# Patient Record
Sex: Female | Born: 1940 | Race: White | Hispanic: No | Marital: Married | Smoking: Never smoker
Health system: Southern US, Community
[De-identification: ages and names within clinical notes are randomized; demographics above are authoritative.]

## PROBLEM LIST (undated history)

## (undated) DIAGNOSIS — G4752 REM sleep behavior disorder: Secondary | ICD-10-CM

## (undated) DIAGNOSIS — Z8719 Personal history of other diseases of the digestive system: Secondary | ICD-10-CM

## (undated) DIAGNOSIS — R079 Chest pain, unspecified: Secondary | ICD-10-CM

## (undated) DIAGNOSIS — K5900 Constipation, unspecified: Secondary | ICD-10-CM

## (undated) DIAGNOSIS — E785 Hyperlipidemia, unspecified: Secondary | ICD-10-CM

## (undated) DIAGNOSIS — R51 Headache: Secondary | ICD-10-CM

## (undated) DIAGNOSIS — M940 Chondrocostal junction syndrome [Tietze]: Secondary | ICD-10-CM

## (undated) DIAGNOSIS — M4800 Spinal stenosis, site unspecified: Secondary | ICD-10-CM

## (undated) DIAGNOSIS — G20 Parkinson's disease: Secondary | ICD-10-CM

## (undated) DIAGNOSIS — R7303 Prediabetes: Secondary | ICD-10-CM

## (undated) DIAGNOSIS — M1990 Unspecified osteoarthritis, unspecified site: Secondary | ICD-10-CM

## (undated) DIAGNOSIS — D649 Anemia, unspecified: Secondary | ICD-10-CM

## (undated) DIAGNOSIS — Z789 Other specified health status: Secondary | ICD-10-CM

## (undated) DIAGNOSIS — K219 Gastro-esophageal reflux disease without esophagitis: Secondary | ICD-10-CM

## (undated) DIAGNOSIS — R519 Headache, unspecified: Secondary | ICD-10-CM

## (undated) HISTORY — PX: COLONOSCOPY: SHX174

## (undated) HISTORY — DX: Parkinson's disease: G20

## (undated) HISTORY — PX: ABDOMINAL HYSTERECTOMY: SHX81

## (undated) HISTORY — DX: Other specified health status: Z78.9

## (undated) HISTORY — DX: Prediabetes: R73.03

## (undated) HISTORY — DX: REM sleep behavior disorder: G47.52

## (undated) HISTORY — DX: Chondrocostal junction syndrome (tietze): M94.0

## (undated) HISTORY — PX: APPENDECTOMY: SHX54

## (undated) HISTORY — PX: HERNIA REPAIR: SHX51

## (undated) HISTORY — DX: Gastro-esophageal reflux disease without esophagitis: K21.9

## (undated) HISTORY — DX: Hyperlipidemia, unspecified: E78.5

## (undated) HISTORY — DX: Spinal stenosis, site unspecified: M48.00

---

## 1998-10-28 ENCOUNTER — Ambulatory Visit (HOSPITAL_COMMUNITY): Admission: RE | Admit: 1998-10-28 | Discharge: 1998-10-28 | Payer: Self-pay | Admitting: Internal Medicine

## 1998-10-28 ENCOUNTER — Encounter: Payer: Self-pay | Admitting: Internal Medicine

## 1998-11-18 ENCOUNTER — Encounter: Payer: Self-pay | Admitting: Obstetrics and Gynecology

## 1998-11-18 ENCOUNTER — Ambulatory Visit (HOSPITAL_COMMUNITY): Admission: RE | Admit: 1998-11-18 | Discharge: 1998-11-18 | Payer: Self-pay | Admitting: Obstetrics and Gynecology

## 1999-07-05 ENCOUNTER — Other Ambulatory Visit: Admission: RE | Admit: 1999-07-05 | Discharge: 1999-07-05 | Payer: Self-pay | Admitting: Podiatry

## 2000-01-02 ENCOUNTER — Other Ambulatory Visit: Admission: RE | Admit: 2000-01-02 | Discharge: 2000-01-02 | Payer: Self-pay | Admitting: Obstetrics and Gynecology

## 2001-02-11 ENCOUNTER — Other Ambulatory Visit: Admission: RE | Admit: 2001-02-11 | Discharge: 2001-02-11 | Payer: Self-pay | Admitting: Obstetrics and Gynecology

## 2002-02-07 ENCOUNTER — Other Ambulatory Visit: Admission: RE | Admit: 2002-02-07 | Discharge: 2002-02-07 | Payer: Self-pay | Admitting: Obstetrics and Gynecology

## 2002-02-19 ENCOUNTER — Ambulatory Visit (HOSPITAL_COMMUNITY): Admission: RE | Admit: 2002-02-19 | Discharge: 2002-02-19 | Payer: Self-pay | Admitting: Gastroenterology

## 2002-10-07 HISTORY — PX: BUNIONECTOMY: SHX129

## 2009-07-30 ENCOUNTER — Encounter: Admission: RE | Admit: 2009-07-30 | Discharge: 2009-07-30 | Payer: Self-pay | Admitting: Cardiology

## 2011-04-14 NOTE — Procedures (Signed)
[facility]  Patient:    Alexandra Lindsey, Alexandra Lindsey [facility] Visit Number: [id number] MRN: [id number]          Service Type: Attending:  Everardo All. Madilyn Fireman, M.D. Dictated by:   Everardo All Madilyn Fireman, M.D. Proc. Date: 02/19/02   CC:         Marinus Maw, M.D.   Procedure Report  PROCEDURE:  Colonoscopy.  INDICATION FOR PROCEDURE:  History of adenomatous colon polyps with last colonoscopy five years ago.  DESCRIPTION OF PROCEDURE:  The patient was placed in the left lateral decubitus position and placed on the pulse monitor with continuous low flow oxygen delivered by nasal cannula. She was sedated with 60 mg IV Demerol and 6 mg IV Versed. The Olympus video colonoscope was inserted into the rectum and advanced to the cecum, confirmed by transillumination at McBurneys point and visualization of the ileocecal valve and appendiceal orifice. The prep was excellent. The cecum, ascending, transverse, descending and sigmoid colon all appeared normal with no masses, polyps, diverticula or other mucosal abnormalities. The rectum likewise appeared normal and retroflexed view of the anus revealed only small internal hemorrhoids. The colonoscope was then withdrawn and the patient returned to the recovery room in stable condition. The patient tolerated the procedure well and there were no immediate complications.  IMPRESSION:  Internal hemorrhoids otherwise normal colonoscopy.  PLAN:  Repeat colonoscopy in five years. Dictated by:   Everardo All Madilyn Fireman, M.D. Attending:  Everardo All. Madilyn Fireman, M.D. DD:  02/19/02 TD:  02/19/02 Job: [id number]

## 2011-11-28 HISTORY — PX: CARDIAC CATHETERIZATION: SHX172

## 2011-12-26 DIAGNOSIS — Z0000 Encounter for general adult medical examination without abnormal findings: Secondary | ICD-10-CM | POA: Diagnosis not present

## 2011-12-26 DIAGNOSIS — Z01419 Encounter for gynecological examination (general) (routine) without abnormal findings: Secondary | ICD-10-CM | POA: Diagnosis not present

## 2012-01-08 DIAGNOSIS — E559 Vitamin D deficiency, unspecified: Secondary | ICD-10-CM | POA: Diagnosis not present

## 2012-01-08 DIAGNOSIS — E782 Mixed hyperlipidemia: Secondary | ICD-10-CM | POA: Diagnosis not present

## 2012-01-08 DIAGNOSIS — Z79899 Other long term (current) drug therapy: Secondary | ICD-10-CM | POA: Diagnosis not present

## 2012-01-08 DIAGNOSIS — I10 Essential (primary) hypertension: Secondary | ICD-10-CM | POA: Diagnosis not present

## 2012-01-08 DIAGNOSIS — R7309 Other abnormal glucose: Secondary | ICD-10-CM | POA: Diagnosis not present

## 2012-01-15 DIAGNOSIS — G20 Parkinson's disease: Secondary | ICD-10-CM | POA: Diagnosis not present

## 2012-02-01 DIAGNOSIS — D101 Benign neoplasm of tongue: Secondary | ICD-10-CM | POA: Diagnosis not present

## 2012-04-01 ENCOUNTER — Other Ambulatory Visit: Payer: Self-pay | Admitting: Neurology

## 2012-04-01 DIAGNOSIS — H539 Unspecified visual disturbance: Secondary | ICD-10-CM

## 2012-04-01 DIAGNOSIS — R51 Headache: Secondary | ICD-10-CM

## 2012-04-04 ENCOUNTER — Ambulatory Visit
Admission: RE | Admit: 2012-04-04 | Discharge: 2012-04-04 | Disposition: A | Discharge status: Home / Self Care | Payer: Medicare Other | Source: Ambulatory Visit | Attending: Neurology | Admitting: Neurology

## 2012-04-04 DIAGNOSIS — H539 Unspecified visual disturbance: Secondary | ICD-10-CM

## 2012-04-04 DIAGNOSIS — R51 Headache: Secondary | ICD-10-CM | POA: Diagnosis not present

## 2012-04-10 DIAGNOSIS — Z79899 Other long term (current) drug therapy: Secondary | ICD-10-CM | POA: Diagnosis not present

## 2012-04-10 DIAGNOSIS — I10 Essential (primary) hypertension: Secondary | ICD-10-CM | POA: Diagnosis not present

## 2012-04-10 DIAGNOSIS — R7309 Other abnormal glucose: Secondary | ICD-10-CM | POA: Diagnosis not present

## 2012-04-10 DIAGNOSIS — E559 Vitamin D deficiency, unspecified: Secondary | ICD-10-CM | POA: Diagnosis not present

## 2012-04-10 DIAGNOSIS — E782 Mixed hyperlipidemia: Secondary | ICD-10-CM | POA: Diagnosis not present

## 2012-04-23 DIAGNOSIS — R079 Chest pain, unspecified: Secondary | ICD-10-CM | POA: Diagnosis not present

## 2012-04-26 DIAGNOSIS — R0602 Shortness of breath: Secondary | ICD-10-CM | POA: Diagnosis not present

## 2012-04-26 DIAGNOSIS — R079 Chest pain, unspecified: Secondary | ICD-10-CM | POA: Diagnosis not present

## 2012-04-26 DIAGNOSIS — E782 Mixed hyperlipidemia: Secondary | ICD-10-CM | POA: Diagnosis not present

## 2012-04-26 DIAGNOSIS — R0609 Other forms of dyspnea: Secondary | ICD-10-CM | POA: Diagnosis not present

## 2012-05-13 DIAGNOSIS — R079 Chest pain, unspecified: Secondary | ICD-10-CM | POA: Diagnosis not present

## 2012-06-03 DIAGNOSIS — E782 Mixed hyperlipidemia: Secondary | ICD-10-CM | POA: Diagnosis not present

## 2012-06-03 DIAGNOSIS — E559 Vitamin D deficiency, unspecified: Secondary | ICD-10-CM | POA: Diagnosis not present

## 2012-06-03 DIAGNOSIS — R7309 Other abnormal glucose: Secondary | ICD-10-CM | POA: Diagnosis not present

## 2012-06-03 DIAGNOSIS — I10 Essential (primary) hypertension: Secondary | ICD-10-CM | POA: Diagnosis not present

## 2012-06-03 DIAGNOSIS — Z1212 Encounter for screening for malignant neoplasm of rectum: Secondary | ICD-10-CM | POA: Diagnosis not present

## 2012-06-03 DIAGNOSIS — Z79899 Other long term (current) drug therapy: Secondary | ICD-10-CM | POA: Diagnosis not present

## 2012-06-06 DIAGNOSIS — G20 Parkinson's disease: Secondary | ICD-10-CM | POA: Diagnosis not present

## 2012-07-01 ENCOUNTER — Encounter (HOSPITAL_COMMUNITY): Payer: Self-pay | Admitting: Pharmacy Technician

## 2012-07-02 ENCOUNTER — Other Ambulatory Visit: Payer: Self-pay | Admitting: Cardiovascular Disease

## 2012-07-03 ENCOUNTER — Ambulatory Visit (HOSPITAL_COMMUNITY)
Admission: RE | Admit: 2012-07-03 | Discharge: 2012-07-03 | Disposition: A | Discharge status: Home / Self Care | Payer: Medicare Other | Source: Ambulatory Visit | Attending: Cardiovascular Disease | Admitting: Cardiovascular Disease

## 2012-07-03 ENCOUNTER — Encounter (HOSPITAL_COMMUNITY)
Admission: RE | Disposition: A | Discharge status: Home / Self Care | Payer: Self-pay | Source: Ambulatory Visit | Attending: Cardiovascular Disease

## 2012-07-03 DIAGNOSIS — E785 Hyperlipidemia, unspecified: Secondary | ICD-10-CM | POA: Insufficient documentation

## 2012-07-03 DIAGNOSIS — R0789 Other chest pain: Secondary | ICD-10-CM | POA: Insufficient documentation

## 2012-07-03 DIAGNOSIS — G20 Parkinson's disease: Secondary | ICD-10-CM | POA: Diagnosis not present

## 2012-07-03 DIAGNOSIS — R079 Chest pain, unspecified: Secondary | ICD-10-CM | POA: Diagnosis not present

## 2012-07-03 DIAGNOSIS — G20A1 Parkinson's disease without dyskinesia, without mention of fluctuations: Secondary | ICD-10-CM | POA: Insufficient documentation

## 2012-07-03 HISTORY — PX: LEFT HEART CATHETERIZATION WITH CORONARY ANGIOGRAM: SHX5451

## 2012-07-03 LAB — CBC
MCV: 93 fL (ref 78.0–100.0)
Platelets: 256 10*3/uL (ref 150–400)
RDW: 12.7 % (ref 11.5–15.5)
WBC: 6.7 10*3/uL (ref 4.0–10.5)

## 2012-07-03 LAB — PROTIME-INR
INR: 1 (ref 0.00–1.49)
Prothrombin Time: 13.4 seconds (ref 11.6–15.2)

## 2012-07-03 LAB — BASIC METABOLIC PANEL
BUN: 26 mg/dL — ABNORMAL HIGH (ref 6–23)
Calcium: 9.3 mg/dL (ref 8.4–10.5)
GFR calc Af Amer: 74 mL/min — ABNORMAL LOW (ref >90)
GFR calc non Af Amer: 64 mL/min — ABNORMAL LOW (ref >90)
Glucose, Bld: 100 mg/dL — ABNORMAL HIGH (ref 70–99)
Sodium: 141 mEq/L (ref 135–145)

## 2012-07-03 SURGERY — LEFT HEART CATHETERIZATION WITH CORONARY ANGIOGRAM
Anesthesia: LOCAL

## 2012-07-03 MED ORDER — LIDOCAINE HCL (PF) 1 % IJ SOLN
INTRAMUSCULAR | Status: AC
  Filled 2012-07-03: qty 30

## 2012-07-03 MED ORDER — SODIUM CHLORIDE 0.9 % IV SOLN: INTRAVENOUS | Status: DC

## 2012-07-03 MED ORDER — SODIUM CHLORIDE 0.9 % IV SOLN
INTRAVENOUS | Status: DC
  Administered 2012-07-03: 10:00:00 via INTRAVENOUS

## 2012-07-03 MED ORDER — FENTANYL CITRATE 0.05 MG/ML IJ SOLN
INTRAMUSCULAR | Status: AC
  Filled 2012-07-03: qty 2

## 2012-07-03 MED ORDER — HEPARIN (PORCINE) IN NACL 2-0.9 UNIT/ML-% IJ SOLN
INTRAMUSCULAR | Status: AC
  Filled 2012-07-03: qty 2000

## 2012-07-03 MED ORDER — DIAZEPAM 5 MG PO TABS
ORAL_TABLET | ORAL | Status: AC
  Administered 2012-07-03: 5 mg
  Filled 2012-07-03: qty 1

## 2012-07-03 MED ORDER — MIDAZOLAM HCL 2 MG/2ML IJ SOLN
INTRAMUSCULAR | Status: AC
  Filled 2012-07-03: qty 2

## 2012-07-03 MED ORDER — NITROGLYCERIN 0.2 MG/ML ON CALL CATH LAB
INTRAVENOUS | Status: AC
  Filled 2012-07-03: qty 1

## 2012-07-03 MED ORDER — SODIUM CHLORIDE 0.9 % IJ SOLN: 3.0000 mL | INTRAMUSCULAR | Status: DC | PRN

## 2012-07-03 MED ORDER — DIAZEPAM 5 MG PO TABS: 5.0000 mg | ORAL_TABLET | ORAL | Status: DC

## 2012-07-03 NOTE — CV Procedure (Signed)
Alexandra Lindsey is a 71 y.o. female    [id number]  [id number] LOCATION:  FACILITY: [facility]  PHYSICIAN: Nanetta Batty, M.D. 1940/12/24   DATE OF PROCEDURE:  07/03/2012  DATE OF DISCHARGE:  [facility]  CARDIAC CATHETERIZATION     History: Patient is a 72 yo WF patient of Dr. Clarene Duke who is referred for diagnostic cardiac catheterization after developing recurrent episodes of somewhat atypical chest pain.  A prior nuclear study was normal. Due to recurrent chest pain, definitive cath was recommended.[name]   PROCEDURE DESCRIPTION: Cardiac Catherization  The patient was brought to the second floor  [facility] cath lab in the postabsorptive state. She was premedicated with Versed 2mg  and fentanyl 25 micrograms.. Her right groin was prepped and shaved in usual sterile fashion. Xylocaine 1% was used  for local anesthesia. A 5  French sheath was inserted into the R Femoral  artery. 5 F JL4 and JR4 catheters were used for coronary angiography. A 5 F pigtail catheter was used for left ventriculography.   HEMODYNAMICS:    AO SYSTOLIC/AO DIASTOLIC:  140/68   LV SYSTOLIC/LV DIASTOLIC: 140/15  ANGIOGRAPHIC RESULTS:   1. Left main: Nl  2. LAD: Nl 3. Ramus: Nl 3. Left circumflex: Nl 4. Right coronary artery: Nl  5.  Left ventriculography; RAO left ventriculogram was performed using  24 mL of Visipaque dye at 12 mL/second. The overall LVEF estimated  60 %. No MR or wall motion abnormalities.    IMPRESSION: Normal epicardial coronary arteries.                          Normal LV function  Lennette Bihari, MD, [facility] 07/03/2012 11:28 AM

## 2012-07-03 NOTE — H&P (Signed)
KAYZLEE WIRTANEN is an 71 y.o. female.   Chief Complaint:  Chest Pain HPI:   Patient is a 71 year old Caucasian female with a history of Parkinson's disease and hyperlipidemia. She recurrent somewhat atypical chest pain for several months.  The episodes last anywhere from 10-35 minutes. It typically resolve on their own. She has had tried nitroglycerin sublingual on one occasion the pain eased off after about 10 minutes.  During a couple of the episodes of pain she had radiation to her jaw. She has also been woken from sleep with chest pain.  Nuclear stress test on 04/08/2012 was negative for ischemia and showed an ejection fraction of 78%.  She does exercise regularly and does a lot of dancing. The chest pain is not present during exertion.  Her mother had 2 heart attacks in her 87s.  She presents today for a left heart catheterization.     Family history: Her mother had 2 heart attacks at age 32  Social History:  does not have a smoking history on file. She does not have any smokeless tobacco history on file. Her alcohol and drug histories not on file.  Patient is married has one child does not drink alcohol. She walks 3-4 times a week and does a lot of dancing regularly she does not use tobacco products.  Medications: Stalevo 100/20 mg 2 times daily, Mirapex 1 mg 3 times daily, Zantac 150 mg daily, Sinemet 25/100 mg once daily, red yeast rice extract  Allergies:  Allergies  Allergen Reactions  . Cephalosporins Other (See Comments)    Cannot sleep  . Darvocet (Propoxyphene-Acetaminophen) Nausea And Vomiting  . Levaquin (Levofloxacin) Hives  . Triamcinolone Itching  . Macrobid (Nitrofurantoin Macrocrystal) Swelling and Rash  . Penicillins Rash    No prescriptions prior to admission    Results for orders placed during the hospital encounter of 07/03/12 (from the past 48 hour(s))  CBC     Status: Normal   Collection Time   07/03/12  9:03 AM      Component Value Range Comment   WBC 6.7   4.0 - 10.5 K/uL    RBC 4.17  3.87 - 5.11 MIL/uL    Hemoglobin 12.9  12.0 - 15.0 g/dL    HCT 16.1  09.6 - 04.5 %    MCV 93.0  78.0 - 100.0 fL    MCH 30.9  26.0 - 34.0 pg    MCHC 33.2  30.0 - 36.0 g/dL    RDW 40.9  81.1 - 91.4 %    Platelets 256  150 - 400 K/uL    No results found.  Review of Systems  Constitutional: Negative for fever.  HENT: Negative for congestion, sore throat and neck pain.   Eyes: Negative for blurred vision.  Respiratory: Negative for cough, shortness of breath and wheezing.   Cardiovascular: Negative for chest pain, orthopnea and leg swelling.  Gastrointestinal: Negative for nausea, vomiting and diarrhea.  Neurological: Negative for dizziness and headaches.    Blood pressure 117/64, pulse 68, temperature 97 F (36.1 C), temperature source Oral, resp. rate 18, height 5\' 10"  (1.778 m), weight 63.05 kg (139 lb), SpO2 98.00%. Physical Exam  Constitutional: She is oriented to person, place, and time. She appears well-developed. No distress.       obese  HENT:  Head: Normocephalic and atraumatic.  Mouth/Throat: No oropharyngeal exudate.  Eyes: EOM are normal. Pupils are equal, round, and reactive to light. No scleral icterus.  Neck: Normal range of motion.  Cardiovascular: Normal rate, regular rhythm, S1 normal and S2 normal.   Murmur heard. Pulses:      Radial pulses are 2+ on the right side, and 2+ on the left side.       Dorsalis pedis pulses are 2+ on the right side, and 2+ on the left side.       No carotid bruit  Respiratory: Breath sounds normal. She has no wheezes. She has no rales.  GI: Soft. Bowel sounds are normal. She exhibits no distension. There is no tenderness.  Musculoskeletal: She exhibits no edema.       No lower extremity edema  Neurological: She is alert and oriented to person, place, and time. She exhibits normal muscle tone.  Skin: Skin is warm and dry.  Psychiatric: She has a normal mood and affect.     Assessment/Plan 1.  Chest  Pain 2.  Hyperlipidemia 3.  Parkinson's disease  Plan: Patient presents for left heart catheterization and possible PCI.   HAGER, BRYAN 07/03/2012, 9:37 AM   Patient seen and examined. Agree with assessment and plan. Discussed cath with pt and husband. Plan this am. Labs reviewed.   Lennette Bihari, MD, [facility] 07/03/2012 4:06 PM

## 2012-07-18 DIAGNOSIS — R079 Chest pain, unspecified: Secondary | ICD-10-CM | POA: Diagnosis not present

## 2012-08-12 DIAGNOSIS — Z23 Encounter for immunization: Secondary | ICD-10-CM | POA: Diagnosis not present

## 2012-08-22 DIAGNOSIS — Z1231 Encounter for screening mammogram for malignant neoplasm of breast: Secondary | ICD-10-CM | POA: Diagnosis not present

## 2012-09-04 DIAGNOSIS — Z79899 Other long term (current) drug therapy: Secondary | ICD-10-CM | POA: Diagnosis not present

## 2012-09-04 DIAGNOSIS — I10 Essential (primary) hypertension: Secondary | ICD-10-CM | POA: Diagnosis not present

## 2012-09-04 DIAGNOSIS — R7309 Other abnormal glucose: Secondary | ICD-10-CM | POA: Diagnosis not present

## 2012-09-04 DIAGNOSIS — E782 Mixed hyperlipidemia: Secondary | ICD-10-CM | POA: Diagnosis not present

## 2012-09-04 DIAGNOSIS — E559 Vitamin D deficiency, unspecified: Secondary | ICD-10-CM | POA: Diagnosis not present

## 2012-10-07 DIAGNOSIS — G20 Parkinson's disease: Secondary | ICD-10-CM

## 2012-10-07 DIAGNOSIS — G20A1 Parkinson's disease without dyskinesia, without mention of fluctuations: Secondary | ICD-10-CM

## 2012-10-07 DIAGNOSIS — E785 Hyperlipidemia, unspecified: Secondary | ICD-10-CM

## 2012-10-07 DIAGNOSIS — G4752 REM sleep behavior disorder: Secondary | ICD-10-CM | POA: Diagnosis not present

## 2012-10-07 DIAGNOSIS — G479 Sleep disorder, unspecified: Secondary | ICD-10-CM | POA: Insufficient documentation

## 2012-10-07 HISTORY — DX: Hyperlipidemia, unspecified: E78.5

## 2012-10-07 HISTORY — PX: ROTATOR CUFF REPAIR: SHX139

## 2012-10-07 HISTORY — DX: Parkinson's disease without dyskinesia, without mention of fluctuations: G20.A1

## 2012-10-07 HISTORY — DX: Parkinson's disease: G20

## 2012-12-11 DIAGNOSIS — H35369 Drusen (degenerative) of macula, unspecified eye: Secondary | ICD-10-CM | POA: Diagnosis not present

## 2013-01-17 ENCOUNTER — Encounter: Payer: Self-pay | Admitting: Neurology

## 2013-01-17 DIAGNOSIS — R51 Headache: Secondary | ICD-10-CM

## 2013-01-17 DIAGNOSIS — G479 Sleep disorder, unspecified: Secondary | ICD-10-CM

## 2013-01-17 DIAGNOSIS — M7072 Other bursitis of hip, left hip: Secondary | ICD-10-CM

## 2013-01-17 DIAGNOSIS — R079 Chest pain, unspecified: Secondary | ICD-10-CM

## 2013-01-17 DIAGNOSIS — G20 Parkinson's disease: Secondary | ICD-10-CM | POA: Insufficient documentation

## 2013-02-11 DIAGNOSIS — J019 Acute sinusitis, unspecified: Secondary | ICD-10-CM | POA: Diagnosis not present

## 2013-02-11 DIAGNOSIS — H00019 Hordeolum externum unspecified eye, unspecified eyelid: Secondary | ICD-10-CM | POA: Diagnosis not present

## 2013-02-13 ENCOUNTER — Encounter: Payer: Self-pay | Admitting: Neurology

## 2013-02-13 ENCOUNTER — Ambulatory Visit (INDEPENDENT_AMBULATORY_CARE_PROVIDER_SITE_OTHER): Payer: Medicare Other | Admitting: Neurology

## 2013-02-13 VITALS — BP 118/58 | HR 64 | Temp 96.9°F | Ht 60.0 in | Wt 149.0 lb

## 2013-02-13 DIAGNOSIS — G4752 REM sleep behavior disorder: Secondary | ICD-10-CM

## 2013-02-13 DIAGNOSIS — G20 Parkinson's disease: Secondary | ICD-10-CM

## 2013-02-13 HISTORY — DX: REM sleep behavior disorder: G47.52

## 2013-02-13 MED ORDER — QUETIAPINE FUMARATE 25 MG PO TABS: ORAL_TABLET | ORAL | Status: DC

## 2013-02-13 NOTE — Patient Instructions (Signed)
Let's continue your meds for Parkinson's. Have your Pharmacy call us for refills. We will try low dose Seroquel generic 25 mg strength, 1/2 pill at bedtime as needed for your dream enactments. Call for questions, otherwise, follow up in 6 months.
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
## 2013-05-28 DIAGNOSIS — L255 Unspecified contact dermatitis due to plants, except food: Secondary | ICD-10-CM | POA: Diagnosis not present

## 2013-06-12 ENCOUNTER — Other Ambulatory Visit: Payer: Self-pay

## 2013-06-12 ENCOUNTER — Emergency Department (HOSPITAL_COMMUNITY): Payer: Medicare Other

## 2013-06-12 ENCOUNTER — Encounter (HOSPITAL_COMMUNITY): Payer: Self-pay | Admitting: Emergency Medicine

## 2013-06-12 ENCOUNTER — Emergency Department (HOSPITAL_COMMUNITY)
Admission: EM | Admit: 2013-06-12 | Discharge: 2013-06-12 | Disposition: A | Discharge status: Home / Self Care | Payer: Medicare Other | Attending: Emergency Medicine | Admitting: Emergency Medicine

## 2013-06-12 DIAGNOSIS — Z8739 Personal history of other diseases of the musculoskeletal system and connective tissue: Secondary | ICD-10-CM | POA: Insufficient documentation

## 2013-06-12 DIAGNOSIS — Z8639 Personal history of other endocrine, nutritional and metabolic disease: Secondary | ICD-10-CM | POA: Insufficient documentation

## 2013-06-12 DIAGNOSIS — Z9889 Other specified postprocedural states: Secondary | ICD-10-CM | POA: Diagnosis not present

## 2013-06-12 DIAGNOSIS — Z862 Personal history of diseases of the blood and blood-forming organs and certain disorders involving the immune mechanism: Secondary | ICD-10-CM | POA: Insufficient documentation

## 2013-06-12 DIAGNOSIS — G4752 REM sleep behavior disorder: Secondary | ICD-10-CM | POA: Insufficient documentation

## 2013-06-12 DIAGNOSIS — J40 Bronchitis, not specified as acute or chronic: Secondary | ICD-10-CM | POA: Diagnosis not present

## 2013-06-12 DIAGNOSIS — G20 Parkinson's disease: Secondary | ICD-10-CM | POA: Insufficient documentation

## 2013-06-12 DIAGNOSIS — R0789 Other chest pain: Secondary | ICD-10-CM | POA: Diagnosis not present

## 2013-06-12 DIAGNOSIS — R079 Chest pain, unspecified: Secondary | ICD-10-CM | POA: Diagnosis not present

## 2013-06-12 DIAGNOSIS — G20A1 Parkinson's disease without dyskinesia, without mention of fluctuations: Secondary | ICD-10-CM | POA: Insufficient documentation

## 2013-06-12 LAB — POCT I-STAT TROPONIN I: Troponin i, poc: 0.01 ng/mL (ref 0.00–0.08)

## 2013-06-12 LAB — COMPREHENSIVE METABOLIC PANEL
ALT: 5 U/L (ref 0–35)
AST: 15 U/L (ref 0–37)
Alkaline Phosphatase: 78 U/L (ref 39–117)
CO2: 25 mEq/L (ref 19–32)
Calcium: 9.4 mg/dL (ref 8.4–10.5)
GFR calc non Af Amer: 64 mL/min — ABNORMAL LOW (ref >90)
Glucose, Bld: 95 mg/dL (ref 70–99)
Potassium: 3.9 mEq/L (ref 3.5–5.1)
Sodium: 139 mEq/L (ref 135–145)

## 2013-06-12 LAB — CBC
Hemoglobin: 12.8 g/dL (ref 12.0–15.0)
MCH: 31.4 pg (ref 26.0–34.0)
Platelets: 260 10*3/uL (ref 150–400)
RBC: 4.08 MIL/uL (ref 3.87–5.11)
WBC: 5.8 10*3/uL (ref 4.0–10.5)

## 2013-06-12 MED ORDER — SODIUM CHLORIDE 0.9 % IV SOLN
20.0000 mL | INTRAVENOUS | Status: DC
  Administered 2013-06-12: 20 mL via INTRAVENOUS

## 2013-06-12 NOTE — ED Notes (Signed)
Pt c/o central cp for weeks on and off that radiates to left arm and jar described as sharp in nature with some sob. Pt rated pain 4/10.Pt received  324 ASA, and 1 Nitro and rates her pain 0/10. Pt states she sees a cardiologist Dr. Tresa Endo and had a cardiac cath and echo gram and showed nothing. Pt states she has parkinson's and takes her meds at 3, pt has not had her medication yet and says she had difficulty walking after not taking them. B/p 120/64.

## 2013-06-12 NOTE — ED Provider Notes (Signed)
TIME OF ENCOUNTER: 06/12/2013 3:22 PM. Nursing triage note reviewed.  PCP: Nadean Corwin, MD  CHIEF COMPLAINT: Chest Pain   HISTORY OF PRESENT ILLNESS: Alexandra Lindsey is a 72 y.o. female who presents with chest pain. She states that the pain is substernal and radiates to her neck and back as well as her bilateral arms. She states that the pain is sharp. She states that the pain lasts approximately 15 minutes and then resolves on its own. She states that she's had multiple episodes in the last 2 weeks. She denies any associated nausea diaphoresis vomiting or pleurisy. She does state however that the pain takes her breath away. She says that there are no relieving or exacerbating factors including position, breathing or food. She states that she has had this pain intermittently for the last several years and even had a cardiac catheterization done in August of 2013 which was negative. She notes that she is never come to the emergency department when she currently has pain.   Review of Cardiac cath from 06-2012: HEMODYNAMICS:  AO SYSTOLIC/AO DIASTOLIC: 140/68  LV SYSTOLIC/LV DIASTOLIC: 140/15  ANGIOGRAPHIC RESULTS:  1. Left main: Nl  2. LAD: Nl  3. Ramus: Nl  3. Left circumflex: Nl  4. Right coronary artery: Nl  5. Left ventriculography; RAO left ventriculogram was performed using  24 mL of Visipaque dye at 12 mL/second. The overall LVEF estimated  60 %. No MR or wall motion abnormalities.  IMPRESSION: Normal epicardial coronary arteries.  Normal LV function    PAST MEDICAL HISTORY: Patient Active Problem List   Diagnosis Date Noted  . 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 Parkinson's disease   . Chest pain 10/07/2012  . 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 Headache 10/07/2012  . Bursitis of left hip 10/07/2012    Past Medical History  Diagnosis Date  . Hyperlipidemia 10-07-12  . Parkinson's disease 10-07-12  . 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 Never smoked  tobacco     Nurses notes read and reviewed and agree unless otherwise noted.  PAST SURGICAL HISTORY: Past Surgical History  Procedure Laterality Date  . Abdominal hysterectomy    . Hernia repair    . Rotator cuff repair N/A 10-07-12  . Bunionectomy N/A 11-11-3  . Cardiac catheterization Bilateral 2013     SOCIAL HISTORY Patient denies any significant drug or alcohol abuse History  Smoking status  . Not on file  Smokeless tobacco  . Not on file    History  Alcohol Use No    History  Drug Use No    FAMILY HISTORY: Reviewed and felt to be non contributory to history of present illness No family history on file.  MEDICATIONS: Reviewed  ALLERGIES: Allergies  Allergen Reactions  . Codeine   . Selegiline Hcl   . Cephalosporins Other (See Comments)    Cannot sleep  . Darvocet (Propoxyphene-Acetaminophen) Nausea And Vomiting  . Levaquin (Levofloxacin) Hives  . Triamcinolone Itching  . Macrobid (Nitrofurantoin Macrocrystal) Swelling and Rash  . Penicillins Rash    REVIEW OF SYSTEMS:   Constitutional  No fever, chills,significant change in weight Integumentary   No lesions, rashes,or significant itching Eyes  No change in vision, diplopia, pain,or discharge Ears/Nose/Throat EARS: DENIES:  Pain or discharge NOSE: DENIES: nosebleeds or obstruction THROAT: DENIES: airway obstruction,  trouble swallowing Cardiovascular  see HPI Respiratory  see HPI Gastrointestinal  No nausea, vomiting, diarrhea, abdominal pain,  black or bloody stools Genitourinary   No urinary frequency, urgency,  or dysuria Musculoskeletal  No significant neck or back pain reported Endocrine  No significant change in weight or cold intolerance reported Hematologic/Lymphatic  No significant swollen glands or easy bruising reported Neurological  No headaches dizziness or new focal weaknes Allergic/Immunologic  No specific immunologic complaint Psychiatric  No specific suicidal or  homicidal thoughts reported    All other review of systems is otherwise negative except as above and as described in the HPI.  PHYSICAL EXAM  CONSTITUTIONAL  well developed, well nourished, alert, non toxic appearing HEENT  normocephalic, atraumatic, external ears normal, nose normal, mucus membranes moist, oropharynx clear EYES  normal conjunctiva, no sclera icterus noted, EOM intact, PERRL NECK  normal ROM, supple, no JVD, trachea normal, no mass CARDIOVASCULAR RRR no signifcant murmur noted, full and effective pulses; chest pain is not reproducible PULMONARY/CHEST WALL  normal effort, no respiratory distress, BS normal, no wheezes, no rhonchi, no rales ABDOMINAL  normal appearance, no distension, no mass, no pulsatile mass; soft, non tender, no rigidity, guarding or rebound GU  No cvat;  Genital eval deferred MUSCULOSKELETAL  Normal range of motion of extremities;  no midline cervical, thoracic, or lumbar spine tenderness, no peripheral edema, no calf tenderness or cords NEUROLOGICAL  patient is awake and responsive;  no significant motor or sensory deficit noted  LYMPHATIC  no cervical or other adenopathy appreciated SKIN  warm, dry, intact, no rash PSYCHIATRIC  No suicidal or homicidal ideations;  Normal affect and expected cognition  DIAGNOSTIC STUDIES    ED PROVIDER INTERPRETATION OF DATA: Laboratory and radiology tests have been ordered with results reviewed, interpreted and considered in the medical decision making process.      EKG INTERPRETATION:   EKG: normal EKG, normal sinus rhythm with rate 68.  Non-spec ST-T wave changes.      RADIOLOGY INTERPRETATION:     CHEST - 2 VIEW  Comparison: None.  Findings: Borderline enlarged cardiac silhouette and mediastinal contours. There is mild elevation/eventration of the right hemidiaphragm. There is mild diffuse thickening of the pulmonary interstitium. No definite evidence of edema. No pleural effusion or pneumothorax. No  acute osseous abnormalities.  IMPRESSION: Mild cardiomegaly and bronchitic change without acute cardiopulmonary disease.   MDM: Pt with hx of intermittent CP for years for which she had cardiac cath as noted.  Now here with the same.  EKG ok.  Trop neg x 2.  Doubt ACS.  CXR unremarkable.  Doubt PE based on clinical presentation.  Doubt aortic etiology.  She does have some hx of GERD-type issues, and this may be related to esophageal spasm.  Will rec f/u with PCP for further eval with strict return precautions.  DISPO: home  CONDITION: stable      Ashby Dawes, MD 06/12/13 2101

## 2013-06-12 NOTE — ED Notes (Addendum)
Pt states she has not had medications for parkinson's disease, she normally takes them around 3. Pt states she has difficulty ambulating when she does not take them.

## 2013-06-12 NOTE — ED Notes (Signed)
Discharge instructions reviewed. Pt verbalized understanding.  

## 2013-06-12 NOTE — ED Notes (Signed)
Dr. Walker at bedside 

## 2013-07-02 ENCOUNTER — Other Ambulatory Visit: Payer: Self-pay

## 2013-08-13 ENCOUNTER — Telehealth: Payer: Self-pay | Admitting: Neurology

## 2013-08-15 NOTE — Telephone Encounter (Signed)
Alexandra Lindsey has an appt on Monday. She would like to know the name of the medication she was once prescribed for nightmares? Please call.

## 2013-08-18 ENCOUNTER — Ambulatory Visit (INDEPENDENT_AMBULATORY_CARE_PROVIDER_SITE_OTHER): Payer: Medicare Other | Admitting: Neurology

## 2013-08-18 ENCOUNTER — Encounter: Payer: Self-pay | Admitting: Neurology

## 2013-08-18 VITALS — BP 115/67 | HR 67 | Temp 98.0°F | Ht 60.0 in | Wt 144.0 lb

## 2013-08-18 DIAGNOSIS — G479 Sleep disorder, unspecified: Secondary | ICD-10-CM

## 2013-08-18 DIAGNOSIS — G20 Parkinson's disease: Secondary | ICD-10-CM

## 2013-08-18 DIAGNOSIS — G4752 REM sleep behavior disorder: Secondary | ICD-10-CM

## 2013-08-18 DIAGNOSIS — R079 Chest pain, unspecified: Secondary | ICD-10-CM | POA: Diagnosis not present

## 2013-08-18 MED ORDER — CLONAZEPAM 0.125 MG PO TBDP: 0.1250 mg | ORAL_TABLET | Freq: Every evening | ORAL | Status: DC | PRN
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
## 2013-08-18 NOTE — Patient Instructions (Signed)
I think overall you are doing fairly well but I do want to suggest a few things today:  Remember to drink plenty of fluid, eat healthy meals and do not skip any meals. Try to eat protein with a every meal and eat a healthy snack such as fruit or nuts in between meals. Try to keep a regular sleep-wake schedule and try to exercise daily, particularly in the form of walking, 20-30 minutes a day, if you can.   Engage in social activities in your community and with your family and try to keep up with current events by reading the newspaper or watching the news.   As far as your medications are concerned, I would like to suggest: trial of low dose clonazepam, 0.125 mg at bedtime. Try for 3-7 nights to help with your dream activity, keep all meds the same. Please call to report next week, we may or may not continue with that.   As far as diagnostic testing: no new test.  I would like to see you back in 6 months, sooner if we need to. Please call us with any interim questions, concerns, problems, updates or refill requests.  Brett Canales is my clinical assistant and will answer any of your questions and relay your messages to me and also relay most of my messages to you.  Our phone number is [phone]. We also have an after hours call service for urgent matters and there is a physician on-call for urgent questions. For any emergencies you know to call 911 or go to the nearest emergency room.

## 2013-08-26 DIAGNOSIS — Z23 Encounter for immunization: Secondary | ICD-10-CM | POA: Diagnosis not present

## 2013-09-08 DIAGNOSIS — E782 Mixed hyperlipidemia: Secondary | ICD-10-CM | POA: Diagnosis not present

## 2013-09-08 DIAGNOSIS — N3000 Acute cystitis without hematuria: Secondary | ICD-10-CM | POA: Diagnosis not present

## 2013-09-08 DIAGNOSIS — I10 Essential (primary) hypertension: Secondary | ICD-10-CM | POA: Diagnosis not present

## 2013-09-08 DIAGNOSIS — Z79899 Other long term (current) drug therapy: Secondary | ICD-10-CM | POA: Diagnosis not present

## 2013-09-08 DIAGNOSIS — E559 Vitamin D deficiency, unspecified: Secondary | ICD-10-CM | POA: Diagnosis not present

## 2013-09-08 DIAGNOSIS — R7309 Other abnormal glucose: Secondary | ICD-10-CM | POA: Diagnosis not present

## 2013-09-08 DIAGNOSIS — Z23 Encounter for immunization: Secondary | ICD-10-CM | POA: Diagnosis not present

## 2013-09-25 DIAGNOSIS — Z01419 Encounter for gynecological examination (general) (routine) without abnormal findings: Secondary | ICD-10-CM | POA: Diagnosis not present

## 2013-09-25 DIAGNOSIS — Z0000 Encounter for general adult medical examination without abnormal findings: Secondary | ICD-10-CM | POA: Diagnosis not present

## 2013-10-02 ENCOUNTER — Other Ambulatory Visit: Payer: Self-pay

## 2013-10-06 DIAGNOSIS — Z8669 Personal history of other diseases of the nervous system and sense organs: Secondary | ICD-10-CM | POA: Diagnosis not present

## 2013-10-06 DIAGNOSIS — Z1231 Encounter for screening mammogram for malignant neoplasm of breast: Secondary | ICD-10-CM | POA: Diagnosis not present

## 2013-10-06 DIAGNOSIS — Z780 Asymptomatic menopausal state: Secondary | ICD-10-CM | POA: Diagnosis not present

## 2013-10-06 DIAGNOSIS — M76899 Other specified enthesopathies of unspecified lower limb, excluding foot: Secondary | ICD-10-CM | POA: Diagnosis not present

## 2013-10-06 DIAGNOSIS — M810 Age-related osteoporosis without current pathological fracture: Secondary | ICD-10-CM | POA: Diagnosis not present

## 2013-10-08 ENCOUNTER — Other Ambulatory Visit: Payer: Self-pay | Admitting: Physician Assistant

## 2013-10-09 ENCOUNTER — Other Ambulatory Visit: Payer: Self-pay

## 2013-10-09 MED ORDER — ALENDRONATE SODIUM 70 MG PO TABS: 70.0000 mg | ORAL_TABLET | ORAL | Status: DC

## 2013-10-14 DIAGNOSIS — M545 Low back pain: Secondary | ICD-10-CM | POA: Diagnosis not present

## 2013-10-14 DIAGNOSIS — M47817 Spondylosis without myelopathy or radiculopathy, lumbosacral region: Secondary | ICD-10-CM | POA: Diagnosis not present

## 2013-10-16 DIAGNOSIS — M47817 Spondylosis without myelopathy or radiculopathy, lumbosacral region: Secondary | ICD-10-CM | POA: Diagnosis not present

## 2013-10-21 ENCOUNTER — Encounter: Payer: Self-pay | Admitting: *Deleted

## 2013-10-21 NOTE — Telephone Encounter (Deleted)
ERROR

## 2013-10-21 NOTE — Telephone Encounter (Signed)
ERROR

## 2013-10-24 ENCOUNTER — Other Ambulatory Visit: Payer: Self-pay

## 2013-10-24 MED ORDER — CARBIDOPA-LEVODOPA 25-100 MG PO TABS: 1.0000 | ORAL_TABLET | Freq: Every day | ORAL | Status: DC

## 2013-11-02 ENCOUNTER — Other Ambulatory Visit: Payer: Self-pay

## 2013-11-02 MED ORDER — CARBIDOPA-LEVODOPA-ENTACAPONE 25-100-200 MG PO TABS: ORAL_TABLET | ORAL | Status: DC

## 2013-11-02 MED ORDER — PRAMIPEXOLE DIHYDROCHLORIDE 1 MG PO TABS: 1.0000 mg | ORAL_TABLET | Freq: Three times a day (TID) | ORAL | Status: DC

## 2013-11-12 ENCOUNTER — Other Ambulatory Visit: Payer: Self-pay | Admitting: Orthopedic Surgery

## 2013-11-12 DIAGNOSIS — M545 Low back pain: Secondary | ICD-10-CM

## 2013-11-12 DIAGNOSIS — M25552 Pain in left hip: Secondary | ICD-10-CM

## 2013-11-13 ENCOUNTER — Ambulatory Visit
Admission: RE | Admit: 2013-11-13 | Discharge: 2013-11-13 | Disposition: A | Discharge status: Home / Self Care | Payer: Medicare Other | Source: Ambulatory Visit | Attending: Orthopedic Surgery | Admitting: Orthopedic Surgery

## 2013-11-13 ENCOUNTER — Telehealth: Payer: Self-pay | Admitting: Neurology

## 2013-11-13 DIAGNOSIS — M25552 Pain in left hip: Secondary | ICD-10-CM

## 2013-11-13 DIAGNOSIS — M5126 Other intervertebral disc displacement, lumbar region: Secondary | ICD-10-CM | POA: Diagnosis not present

## 2013-11-13 DIAGNOSIS — M545 Low back pain: Secondary | ICD-10-CM

## 2013-11-13 MED ORDER — IOHEXOL 180 MG/ML  SOLN
1.0000 mL | Freq: Once | INTRAMUSCULAR | Status: AC | PRN
  Administered 2013-11-13: 1 mL via EPIDURAL

## 2013-11-13 MED ORDER — METHYLPREDNISOLONE ACETATE 40 MG/ML INJ SUSP (RADIOLOG
120.0000 mg | Freq: Once | INTRAMUSCULAR | Status: AC
  Administered 2013-11-13: 120 mg via EPIDURAL

## 2013-11-13 NOTE — Telephone Encounter (Signed)
Calling patient to get questions for Dr Frances Furbish concerning precedure and parkinson's, lt VM message.

## 2013-12-30 DIAGNOSIS — H2589 Other age-related cataract: Secondary | ICD-10-CM | POA: Diagnosis not present

## 2014-01-26 ENCOUNTER — Ambulatory Visit: Payer: Medicare Other | Admitting: Physician Assistant

## 2014-01-26 ENCOUNTER — Other Ambulatory Visit: Payer: Self-pay | Admitting: Internal Medicine

## 2014-01-26 ENCOUNTER — Encounter: Payer: Self-pay | Admitting: Physician Assistant

## 2014-01-26 VITALS — BP 102/62 | HR 72 | Temp 97.9°F | Resp 16 | Ht 59.5 in | Wt 137.0 lb

## 2014-01-26 DIAGNOSIS — Z0000 Encounter for general adult medical examination without abnormal findings: Secondary | ICD-10-CM

## 2014-01-26 DIAGNOSIS — R197 Diarrhea, unspecified: Secondary | ICD-10-CM | POA: Diagnosis not present

## 2014-01-26 DIAGNOSIS — R1032 Left lower quadrant pain: Secondary | ICD-10-CM

## 2014-01-26 DIAGNOSIS — R110 Nausea: Secondary | ICD-10-CM

## 2014-01-26 LAB — CBC WITH DIFFERENTIAL/PLATELET
Basophils Absolute: 0 10*3/uL (ref 0.0–0.1)
Basophils Relative: 0 % (ref 0–1)
EOS ABS: 0.1 10*3/uL (ref 0.0–0.7)
EOS PCT: 1 % (ref 0–5)
HCT: 37.6 % (ref 36.0–46.0)
HEMOGLOBIN: 12.4 g/dL (ref 12.0–15.0)
LYMPHS ABS: 2 10*3/uL (ref 0.7–4.0)
Lymphocytes Relative: 34 % (ref 12–46)
MCH: 30.8 pg (ref 26.0–34.0)
MCHC: 33 g/dL (ref 30.0–36.0)
MCV: 93.3 fL (ref 78.0–100.0)
MONOS PCT: 7 % (ref 3–12)
Monocytes Absolute: 0.4 10*3/uL (ref 0.1–1.0)
Neutro Abs: 3.4 10*3/uL (ref 1.7–7.7)
Neutrophils Relative %: 58 % (ref 43–77)
Platelets: 310 10*3/uL (ref 150–400)
RBC: 4.03 MIL/uL (ref 3.87–5.11)
RDW: 12.9 % (ref 11.5–15.5)
WBC: 5.9 10*3/uL (ref 4.0–10.5)

## 2014-01-26 LAB — HEPATIC FUNCTION PANEL
ALBUMIN: 4.2 g/dL (ref 3.5–5.2)
ALK PHOS: 56 U/L (ref 39–117)
AST: 15 U/L (ref 0–37)
Bilirubin, Direct: 0.1 mg/dL (ref 0.0–0.3)
Indirect Bilirubin: 0.4 mg/dL (ref 0.2–1.2)
Total Bilirubin: 0.5 mg/dL (ref 0.2–1.2)
Total Protein: 6.1 g/dL (ref 6.0–8.3)

## 2014-01-26 LAB — BASIC METABOLIC PANEL WITH GFR
BUN: 20 mg/dL (ref 6–23)
CALCIUM: 9.8 mg/dL (ref 8.4–10.5)
CHLORIDE: 106 meq/L (ref 96–112)
CO2: 27 meq/L (ref 19–32)
CREATININE: 0.87 mg/dL (ref 0.50–1.10)
GFR, Est African American: 78 mL/min
GFR, Est Non African American: 67 mL/min
GLUCOSE: 83 mg/dL (ref 70–99)
Potassium: 4.4 mEq/L (ref 3.5–5.3)
Sodium: 139 mEq/L (ref 135–145)

## 2014-01-26 MED ORDER — CIPROFLOXACIN HCL 500 MG PO TABS: 500.0000 mg | ORAL_TABLET | Freq: Two times a day (BID) | ORAL | Status: DC

## 2014-01-26 MED ORDER — HYOSCYAMINE SULFATE 0.125 MG SL SUBL: 0.1250 mg | SUBLINGUAL_TABLET | SUBLINGUAL | Status: DC | PRN

## 2014-01-26 MED ORDER — METRONIDAZOLE 500 MG PO TABS: 500.0000 mg | ORAL_TABLET | Freq: Three times a day (TID) | ORAL | Status: DC

## 2014-01-26 MED ORDER — DOXYCYCLINE HYCLATE 100 MG PO CAPS: ORAL_CAPSULE | ORAL | Status: DC

## 2014-01-26 NOTE — Progress Notes (Signed)
Subjective:   Alexandra Lindsey is a 73 y.o. female who presents for Medicare Annual Wellness Visit and for abdominal pain.   Her blood pressure has been controlled at home, today their BP is BP: 102/62 mmHg She denies chest pain, shortness of breath, dizziness.  Her cholesterol is diet controlled.  Her cholesterol is controlled.  She has been working on diet and exercise for prediabetes, and has no polyuria or polydipsia, no chest pain, dyspnea or TIAs, no numbness, tingling or pain in extremities.  Patient is on Vitamin D supplement.   This is a new problem. Episode onset: 2 weeks. Episode frequency: 6-8 times. The problem has been gradually worsening. The patient states that diarrhea awakens her from sleep. Associated symptoms include abdominal pain, arthralgias and increased flatus. Pertinent negatives include no bloating, chills, coughing, fever, headaches, myalgias, sweats, URI, vomiting or weight loss. She has tried nothing for the symptoms. Was taking aleve for her back but stopped one week ago.   Names of Other Physician/Practitioners you currently use: 1. [facility]- here for primary care 2. Dr. Sabra Heck, eye doctor, last visit 12/2012 3. Dr. Alexia Freestone, dentist, last visit 12/2012 Patient Care Team: Unk Pinto, MD as PCP - General (Internal Medicine) Missy Sabins, MD as Consulting Physician (Gastroenterology)   Medication Review Current Outpatient Prescriptions on File Prior to Visit  Medication Sig Dispense Refill  . alendronate (FOSAMAX) 70 MG tablet Take 1 tablet (70 mg total) by mouth every 7 (seven) days. Take with a full glass of water on an empty stomach.  4 tablet  11  . carbidopa-levodopa (SINEMET IR) 25-100 MG per tablet Take 1 tablet by mouth daily.  30 tablet  3  . carbidopa-levodopa-entacapone (STALEVO 100) 25-100-200 MG per tablet One tablet at 6am, noon and 6pm and one half tablet at 9am and 3pm  120 tablet  3  .  cholecalciferol (VITAMIN D) 1000 UNITS tablet Take 5,000 Units by mouth daily.      . clonazepam (KLONOPIN) 0.125 MG disintegrating tablet Take 1 tablet (0.125 mg total) by mouth at bedtime as needed.  15 tablet  0  . hydrochlorothiazide (HYDRODIURIL) 25 MG tablet Take 25 mg by mouth daily as needed (fluid retention).       . magnesium oxide (MAG-OX) 400 MG tablet Take 400 mg by mouth daily.      . pramipexole (MIRAPEX) 1 MG tablet Take 1 tablet (1 mg total) by mouth 3 (three) times daily.  90 tablet  3  . QUEtiapine (SEROQUEL) 25 MG tablet Take 6.25 mg by mouth at bedtime.      . ranitidine (ZANTAC) 150 MG tablet Take 150 mg by mouth daily as needed. For acid reflex       No current facility-administered medications on file prior to visit.    Current Problems (verified) Patient Active Problem List   Diagnosis Date Noted  . 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 Parkinson's disease   . Chest pain 10/07/2012  . 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 Headache 10/07/2012  . Bursitis of left hip 10/07/2012    Screening Tests Health Maintenance  Topic Date Due  . Tetanus/tdap  04/14/1961  . Colonoscopy  04/14/1992  . Zostavax  04/14/2002  . Pneumococcal Polysaccharide Vaccine Age 22 And Over  04/15/2007  . Influenza Vaccine  06/27/2013  . Mammogram  10/10/2015    Health Maintenance Topics with due status: Overdue     Topic Date Due   TETANUS/TDAP 04/14/1961  COLONOSCOPY 04/14/1992   ZOSTAVAX 04/14/2002   PNEUMOCOCCAL POLYSACCHARIDE VACCINE AGE 63 AND OVER 04/15/2007   INFLUENZA VACCINE 06/27/2013    Preventative care: Last colonoscopy: 2008 over due Last mammogram: 09/2013 Last pap smear/pelvic exam: remote  DEXA:2014  Prior vaccinations: TD or Tdap: 2014  Influenza: 07/2013 Pneumococcal: 2008 Shingles/Zostavax: 2007  History reviewed: allergies, current medications, past family history, past medical history, past social history, past surgical history and problem  list  Risk Factors: Osteoporosis: postmenopausal estrogen deficiency and dietary calcium and/or vitamin D deficiency History of fracture in the past year: no  Tobacco History  Smoking status  . Never Smoker   Smokeless tobacco  . Not on file   She does not smoke.  Patient is not a former smoker. Are there smokers in your home (other than you)?  No  Alcohol Current alcohol use: none  Caffeine Current caffeine use: denies use  Exercise Exercise limitations: The patient has no exercise limitations. Current exercise: aerobics and walking  Nutrition/Diet Current diet: in general, a "healthy" diet    Cardiac risk factors: advanced age (older than 67 for men, 83 for women), dyslipidemia and hypertension.  Depression Screen Nurse depression screen reviewed.  (Note: if answer to either of the following is "Yes", a more complete depression screening is indicated)   Q1: Over the past two weeks, have you felt down, depressed or hopeless? No  Q2: Over the past two weeks, have you felt little interest or pleasure in doing things? No  Have you lost interest or pleasure in daily life? No  Do you often feel hopeless? No  Do you cry easily over simple problems? No  Activities of Daily Living Nurse ADLs screen reviewed.  In your present state of health, do you have any difficulty performing the following activities?:  Driving? No Managing money?  No Feeding yourself? No Getting from bed to chair? No Climbing a flight of stairs? No Preparing food and eating?: No Bathing or showering? No Getting dressed: No Getting to the toilet? No Using the toilet:No Moving around from place to place: No In the past year have you fallen or had a near fall?:Yes   Are you sexually active?  No  Do you have more than one partner?  No  Vision Difficulties: No  Hearing Difficulties: No Do you often ask people to speak up or repeat themselves? No Do you experience ringing or noises in your  ears? No Do you have difficulty understanding soft or whispered voices? No  Cognition  Do you feel that you have a problem with memory? No  Do you often misplace items? No  Do you feel safe at home?  Yes  Advanced directives Does patient have a [place]? Yes Does patient have a Living Will? Yes    Objective:     Vision and hearing screens reviewed.   Blood pressure 102/62, pulse 72, temperature 97.9 F (36.6 C), resp. rate 16, height 4' 11.5" (1.511 m), weight 137 lb (62.143 kg). Body mass index is 27.22 kg/(m^2).  General appearance: alert, no distress, WD/WN,  female Cognitive Testing  Alert? Yes  Normal Appearance?Yes  Oriented to person? Yes  Place? Yes   Time? Yes  Recall of three objects?  Yes  Can perform simple calculations? Yes  Displays appropriate judgment?Yes  Can read the correct time from a watch face?Yes  HEENT: normocephalic, sclerae anicteric, TMs pearly, nares patent, no discharge or erythema, pharynx normal Oral cavity: MMM, no lesions Neck:  supple, no lymphadenopathy, no thyromegaly, no masses Heart: RRR, normal S1, S2, no murmurs Lungs: CTA bilaterally, no wheezes, rhonchi, or rales Abdomen: +bs, soft, LLQ tenderness, non distended, no masses, no hepatomegaly, no splenomegaly Musculoskeletal: nontender, no swelling, no obvious deformity Extremities: no edema, no cyanosis, no clubbing Pulses: 2+ symmetric, upper and lower extremities, normal cap refill Neurological: alert, oriented x 3, CN2-12 intact, strength normal upper extremities and lower extremities, sensation normal throughout, DTRs 2+ throughout, no cerebellar signs, gait normal Psychiatric: normal affect, behavior normal, pleasant  Breast:defer [id number]  Rectal: defer   Assessment:   Diarrhea with abdominal pain -Get colonoscopy with Dr. Amedeo Plenty -Get labs CBC, BMP, LFTs -Get on levsin, cipro, flaygl -Bland diet, increase fluids, may need CT scan - go to the ER if  it gets worse  Plan:   During the course of the visit the patient was educated and counseled about appropriate screening and preventive services including:    Screening electrocardiogram  Screening mammography  Bone densitometry screening  Colorectal cancer screening  Diabetes screening  Glaucoma screening  Screening recommendations, referrals:  Vaccinations: Tdap vaccine not done  Influenza vaccine not done Pneumococcal vaccine not done Shingles vaccine not done Hep B vaccine not done  Nutrition assessed and recommended  Colonoscopy yes Mammogram yes Pap smear no Pelvic exam no Recommended yearly ophthalmology/optometry visit for glaucoma screening and checkup Recommended yearly dental visit for hygiene and checkup Advanced directives - not done  Conditions/risks identified: BMI: Discussed weight loss, diet, and increase physical activity.  Increase physical activity: AHA recommends 150 minutes of physical activity a week.  Medications reviewed DEXA- requested Diabetes is at goal, ACE/ARB therapy: No, Reason not on Ace Inhibitor/ARB therapy:  only prediabetic Urinary Incontinence is not an issue: discussed non pharmacology and pharmacology options.  Fall risk: high- discussed PT, home fall assessment, medications.   Medicare Attestation I have personally reviewed: The patient's medical and social history Their use of alcohol, tobacco or illicit drugs Their current medications and supplements The patient's functional ability including ADLs,fall risks, home safety risks, cognitive, and hearing and visual impairment Diet and physical activities Evidence for depression or mood disorders  The patient's weight, height, BMI, and visual acuity have been recorded in the chart.  I have made referrals, counseling, and provided education to the patient based on review of the above and I have provided the patient with a written personalized care plan for preventive  services.     Vicie Mutters, PA-C   01/26/2014

## 2014-01-26 NOTE — Patient Instructions (Addendum)
Phone: [phone];  Dr. Amedeo Plenty Call him immediately to set up appointment for colonoscopy  Abdominal Pain, Adult Many things can cause abdominal pain. Usually, abdominal pain is not caused by a disease and will improve without treatment. It can often be observed and treated at home. Your health care provider will do a physical exam and possibly order blood tests and X-rays to help determine the seriousness of your pain. However, in many cases, more time must pass before a clear cause of the pain can be found. Before that point, your health care provider may not know if you need more testing or further treatment. HOME CARE INSTRUCTIONS  Monitor your abdominal pain for any changes. The following actions may help to alleviate any discomfort you are experiencing:  Only take over-the-counter or prescription medicines as directed by your health care provider.  Do not take laxatives unless directed to do so by your health care provider.  Try a clear liquid diet (broth, tea, or water) as directed by your health care provider. Slowly move to a bland diet as tolerated. SEEK MEDICAL CARE IF:  You have unexplained abdominal pain.  You have abdominal pain associated with nausea or diarrhea.  You have pain when you urinate or have a bowel movement.  You experience abdominal pain that wakes you in the night.  You have abdominal pain that is worsened or improved by eating food.  You have abdominal pain that is worsened with eating fatty foods. SEEK IMMEDIATE MEDICAL CARE IF:   Your pain does not go away within 2 hours.  You have a fever.  You keep throwing up (vomiting).  Your pain is felt only in portions of the abdomen, such as the right side or the left lower portion of the abdomen.  You pass bloody or black tarry stools. MAKE SURE YOU:  Understand these instructions.   Will watch your condition.   Will get help right away if you are not doing well or get worse.  Document  Released: 08/23/2005 Document Revised: 09/03/2013 Document Reviewed: 07/23/2013 [facility] Patient Information 2014 [place].  Diet for Diarrhea, Adult Frequent, runny stools (diarrhea) may be caused or worsened by food or drink. Diarrhea may be relieved by changing your diet. Since diarrhea can last up to 7 days, it is easy for you to lose too much fluid from the body and become dehydrated. Fluids that are lost need to be replaced. Along with a modified diet, make sure you drink enough fluids to keep your urine clear or pale yellow. DIET INSTRUCTIONS  Ensure adequate fluid intake (hydration): have 1 cup (8 oz) of fluid for each diarrhea episode. Avoid fluids that contain simple sugars or sports drinks, fruit juices, whole milk products, and sodas. Your urine should be clear or pale yellow if you are drinking enough fluids. Hydrate with an oral rehydration solution that you can purchase at pharmacies, retail stores, and online. You can prepare an oral rehydration solution at home by mixing the following ingredients together:    tsp table salt.   tsp baking soda.   tsp salt substitute containing potassium chloride.  1  tablespoons sugar.  1 L (34 oz) of water.  Certain foods and beverages may increase the speed at which food moves through the gastrointestinal (GI) tract. These foods and beverages should be avoided and include:  Caffeinated and alcoholic beverages.  High-fiber foods, such as raw fruits and vegetables, nuts, seeds, and whole grain breads and cereals.  Foods and beverages sweetened with  sugar alcohols, such as xylitol, sorbitol, and mannitol.  Some foods may be well tolerated and may help thicken stool including:  Starchy foods, such as rice, toast, pasta, low-sugar cereal, oatmeal, grits, baked potatoes, crackers, and bagels.   Bananas.   Applesauce.  Add probiotic-rich foods to help increase healthy bacteria in the GI tract, such as yogurt and fermented milk  products. RECOMMENDED FOODS AND BEVERAGES Starches Choose foods with less than 2 g of fiber per serving.  Recommended:  White, [place], and pita breads, plain rolls, buns, bagels. Plain muffins, matzo. Soda, saltine, or graham crackers. Pretzels, melba toast, zwieback. Cooked cereals made with water: cornmeal, farina, cream cereals. Dry cereals: refined corn, wheat, rice. Potatoes prepared any way without skins, refined macaroni, spaghetti, noodles, refined rice.  Avoid:  Bread, rolls, or crackers made with whole wheat, multi-grains, rye, bran seeds, nuts, or coconut. Corn tortillas or taco shells. Cereals containing whole grains, multi-grains, bran, coconut, nuts, raisins. Cooked or dry oatmeal. Coarse wheat cereals, granola. Cereals advertised as "high-fiber." Potato skins. Whole grain pasta, wild or brown rice. Popcorn. Sweet potatoes, yams. Sweet rolls, doughnuts, waffles, pancakes, sweet breads. Vegetables  Recommended: Strained tomato and vegetable juices. Most well-cooked and canned vegetables without seeds. Fresh: Tender lettuce, cucumber without the skin, cabbage, spinach, bean sprouts.  Avoid: Fresh, cooked, or canned: Artichokes, baked beans, beet greens, broccoli, Brussels sprouts, corn, kale, legumes, peas, sweet potatoes. Cooked: Green or red cabbage, spinach. Avoid large servings of any vegetables because vegetables shrink when cooked, and they contain more fiber per serving than fresh vegetables. Fruit  Recommended: Cooked or canned: Apricots, applesauce, cantaloupe, cherries, fruit cocktail, grapefruit, grapes, kiwi, mandarin oranges, peaches, pears, plums, watermelon. Fresh: Apples without skin, ripe banana, grapes, cantaloupe, cherries, grapefruit, peaches, oranges, plums. Keep servings limited to  cup or 1 piece.  Avoid: Fresh: Apples with skin, apricots, mangoes, pears, raspberries, strawberries. Prune juice, stewed or dried prunes. Dried fruits, raisins, dates. Large servings  of all fresh fruits. Protein  Recommended: Ground or well-cooked tender beef, ham, veal, lamb, pork, or poultry. Eggs. Fish, oysters, shrimp, lobster, other seafoods. Liver, organ meats.  Avoid: Tough, fibrous meats with gristle. Peanut butter, smooth or chunky. Cheese, nuts, seeds, legumes, dried peas, beans, lentils. Dairy  Recommended: Yogurt, lactose-free milk, kefir, drinkable yogurt, buttermilk, soy milk, or plain hard cheese.  Avoid: Milk, chocolate milk, beverages made with milk, such as milkshakes. Soups  Recommended: Bouillon, broth, or soups made from allowed foods. Any strained soup.  Avoid: Soups made from vegetables that are not allowed, cream or milk-based soups. Desserts and Sweets  Recommended: Sugar-free gelatin, sugar-free frozen ice pops made without sugar alcohol.  Avoid: Plain cakes and cookies, pie made with fruit, pudding, custard, cream pie. Gelatin, fruit, ice, sherbet, frozen ice pops. Ice cream, ice milk without nuts. Plain hard candy, honey, jelly, molasses, syrup, sugar, chocolate syrup, gumdrops, marshmallows. Fats and Oils  Recommended: Limit fats to less than 8 tsp per day.  Avoid: Seeds, nuts, olives, avocados. Margarine, butter, cream, mayonnaise, salad oils, plain salad dressings. Plain gravy, crisp bacon without rind. Beverages  Recommended: Water, decaffeinated teas, oral rehydration solutions, sugar-free beverages not sweetened with sugar alcohols.  Avoid: Fruit juices, caffeinated beverages (coffee, tea, soda), alcohol, sports drinks, or lemon-lime soda. Condiments  Recommended: Ketchup, mustard, horseradish, vinegar, cocoa powder. Spices in moderation: allspice, basil, bay leaves, celery powder or leaves, cinnamon, cumin powder, curry powder, ginger, mace, marjoram, onion or garlic powder, oregano, paprika, parsley flakes, ground pepper, rosemary, sage,  savory, tarragon, thyme, turmeric.  Avoid: Coconut, honey. Document Released: 02/03/2004  Document Revised: 08/07/2012 Document Reviewed: 03/29/2012 [facility] Patient Information 2014 [place].  Medial Epicondylitis (Golfer's Elbow) with Rehab Medial epicondylitis involves inflammation and pain around the inner (medial) portion of the elbow. This pain is caused by inflammation of the tendons in the forearm that flex (bring down) the wrist. Medial epicondylitis is also called golfer's elbow, because it is common among golfers. However, it may occur in any individual who flexes the wrist regularly. If medial epicondylitis is left untreated, it may become a chronic problem. SYMPTOMS   Pain, tenderness, or inflammation over the inner (medial) side of the elbow.  Pain or weakness with gripping activities.  Pain that increases with wrist twisting motions (using a screwdriver, playing golf, bowling). CAUSES  Medial epicondylitis is caused by inflammation of the tendons that flex the wrist. Causes of injury may include:  Chronic, repetitive stress and strain to the tendons that run from the wrist and forearm to the elbow.  Sudden strain on the forearm, including wrist snap when serving balls with racquet sports, or throwing a baseball. RISK INCREASES WITH:  Sports or occupations that require repetitive and/or strenuous forearm and wrist movements (pitching a baseball, golfing, carpentry).  Poor wrist and forearm strength and flexibility.  Failure to warm up properly before activity.  Resuming activity before healing, rehabilitation, and conditioning are complete. PREVENTION   Warm up and stretch properly before activity.  Maintain physical fitness:  Strength, flexibility, and endurance.  Cardiovascular fitness.  Wear and use properly fitted equipment.  Learn and use proper technique and have a coach correct improper technique.  Wear a tennis elbow (counterforce) brace. PROGNOSIS  The course of this condition depends on the degree of the injury. If treated  properly, acute cases (symptoms lasting less than 4 weeks) are often resolved in 2 to 6 weeks. Chronic (longer lasting cases) often resolve in 3 to 6 months, but may require physical therapy. RELATED COMPLICATIONS   Frequently recurring symptoms, resulting in a chronic problem. Properly treating the problem the first time decreases frequency of recurrence.  Chronic inflammation, scarring, and partial tendon tear, requiring surgery.  Delayed healing or resolution of symptoms. TREATMENT  Treatment first involves the use of ice and medicine, to reduce pain and inflammation. Strengthening and stretching exercises may reduce discomfort, if performed regularly. These exercises may be performed at home, if the condition is an acute injury. Chronic cases may require a referral to a physical therapist for evaluation and treatment. Your caregiver may advise a corticosteroid injection to help reduce inflammation. Rarely, surgery is needed. MEDICATION  If pain medicine is needed, nonsteroidal anti-inflammatory medicines (aspirin and ibuprofen), or other minor pain relievers (acetaminophen), are often advised.  Do not take pain medicine for 7 days before surgery.  Prescription pain relievers may be given, if your caregiver thinks they are needed. Use only as directed and only as much as you need.  Corticosteroid injections may be recommended. These injections should be reserved only for the most severe cases, because they can only be given a certain number of times. HEAT AND COLD  Cold treatment (icing) should be applied for 10 to 15 minutes every 2 to 3 hours for inflammation and pain, and immediately after activity that aggravates your symptoms. Use ice packs or an ice massage.  Heat treatment may be used before performing stretching and strengthening activities prescribed by your caregiver, physical therapist, or athletic trainer. Use a heat pack or  a warm water soak. SEEK MEDICAL CARE IF: Symptoms  get worse or do not improve in 2 weeks, despite treatment. EXERCISES  RANGE OF MOTION (ROM) AND STRETCHING EXERCISES - Epicondylitis, Medial (Golfer's Elbow) These exercises may help you when beginning to rehabilitate your injury. Your symptoms may go away with or without further involvement from your physician, physical therapist or athletic trainer. While completing these exercises, remember:   Restoring tissue flexibility helps normal motion to return to the joints. This allows healthier, less painful movement and activity.  An effective stretch should be held for at least 30 seconds.  A stretch should never be painful. You should only feel a gentle lengthening or release in the stretched tissue. RANGE OF MOTION  Wrist Flexion, Active-Assisted  Extend your right / left elbow with your fingers pointing down.*  Gently pull the back of your hand towards you, until you feel a gentle stretch on the top of your forearm.  Hold this position for __________ seconds. Repeat __________ times. Complete this exercise __________ times per day.  *If directed by your physician, physical therapist or athletic trainer, complete this stretch with your elbow bent, rather than extended. RANGE OF MOTION  Wrist Extension, Active-Assisted  Extend your right / left elbow and turn your palm upwards.*  Gently pull your palm and fingertips back, so your wrist extends and your fingers point more toward the ground.  You should feel a gentle stretch on the inside of your forearm.  Hold this position for __________ seconds. Repeat __________ times. Complete this exercise __________ times per day. *If directed by your physician, physical therapist or athletic trainer, complete this stretch with your elbow bent, rather than extended. STRETCH  Wrist Extension   Place your right / left fingertips on a tabletop leaving your elbow slightly bent. Your fingers should point backwards.  Gently press your fingers and palm  down onto the table, by straightening your elbow. You should feel a stretch on the inside of your forearm.  Hold this position for __________ seconds. Repeat __________ times. Complete this stretch __________ times per day.  STRENGTHENING EXERCISES - Epicondylitis, Medial (Golfer's Elbow) These exercises may help you when beginning to rehabilitate your injury. They may resolve your symptoms with or without further involvement from your physician, physical therapist or athletic trainer. While completing these exercises, remember:   Muscles can gain both the endurance and the strength needed for everyday activities through controlled exercises.  Complete these exercises as instructed by your physician, physical therapist or athletic trainer. Increase the resistance and repetitions only as guided.  You may experience muscle soreness or fatigue, but the pain or discomfort you are trying to eliminate should never worsen during these exercises. If this pain does get worse, stop and make sure you are following the directions exactly. If the pain is still present after adjustments, discontinue the exercise until you can discuss the trouble with your caregiver. STRENGTH Wrist Flexors  Sit with your right / left forearm palm-up, and fully supported on a table or countertop. Your elbow should be resting below the height of your shoulder. Allow your wrist to extend over the edge of the surface.  Loosely holding a __________ weight, or a piece of rubber exercise band or tubing, slowly curl your hand up toward your forearm.  Hold this position for __________ seconds. Slowly lower the wrist back to the starting position in a controlled manner. Repeat __________ times. Complete this exercise __________ times per day.  STRENGTH  Wrist  Extensors  Sit with your right / left forearm palm-down and fully supported. Your elbow should be resting below the height of your shoulder. Allow your wrist to extend over the  edge of the surface.  Loosely holding a __________ weight, or a piece of rubber exercise band or tubing, slowly curl your hand up toward your forearm.  Hold this position for __________ seconds. Slowly lower the wrist back to the starting position in a controlled manner. Repeat __________ times. Complete this exercise __________ times per day.  STRENGTH - Ulnar Deviators  Stand with a ____________________ weight in your right / left hand, or sit while holding a rubber exercise band or tubing, with your healthy arm supported on a table or countertop.  Move your wrist so that your pinkie travels toward your forearm and your thumb moves away from your forearm.  Hold this position for __________ seconds and then slowly lower the wrist back to the starting position. Repeat __________ times. Complete this exercise __________ times per day STRENGTH - Grip   Grasp a tennis ball, a dense sponge, or a large, rolled sock in your hand.  Squeeze as hard as you can, without increasing any pain.  Hold this position for __________ seconds. Release your grip slowly. Repeat __________ times. Complete this exercise __________ times per day.  STRENGTH  Forearm Supinators   Sit with your right / left forearm supported on a table, keeping your elbow below shoulder height. Rest your hand over the edge, palm down.  Gently grip a hammer or a soup ladle.  Without moving your elbow, slowly turn your palm and hand upward to a "thumbs-up" position.  Hold this position for __________ seconds. Slowly return to the starting position. Repeat __________ times. Complete this exercise __________ times per day.  STRENGTH  Forearm Pronators  Sit with your right / left forearm supported on a table, keeping your elbow below shoulder height. Rest your hand over the edge, palm up.  Gently grip a hammer or a soup ladle.  Without moving your elbow, slowly turn your palm and hand upward to a "thumbs-up" position.  Hold  this position for __________ seconds. Slowly return to the starting position. Repeat __________ times. Complete this exercise __________ times per day.  Document Released: 11/13/2005 Document Revised: 02/05/2012 Document Reviewed: 02/25/2009 [facility] Patient Information 2014 [place], [place].

## 2014-01-26 NOTE — Progress Notes (Deleted)
Subjective:    Patient ID: Alexandra Lindsey, female    DOB: Mar 02, 1941, 73 y.o.   MRN: [id number]  Diarrhea  This is a new problem. Episode onset: 2 weeks. Episode frequency: 6-8 times. The problem has been gradually worsening. The patient states that diarrhea awakens her from sleep. Associated symptoms include abdominal pain, arthralgias and increased flatus. Pertinent negatives include no bloating, chills, coughing, fever, headaches, myalgias, sweats, URI, vomiting or weight loss. She has tried nothing for the symptoms.   Was taking aleve for back pain, stopped it 5 days ago and it has not helped.    Review of Systems  Constitutional: Positive for fatigue. Negative for fever, chills, weight loss and diaphoresis.  HENT: Negative.   Respiratory: Negative.  Negative for cough.   Cardiovascular: Negative.   Gastrointestinal: Positive for nausea, abdominal pain, diarrhea and flatus. Negative for vomiting, constipation, blood in stool, abdominal distention, anal bleeding, rectal pain and bloating.  Genitourinary: Negative.   Musculoskeletal: Positive for arthralgias and back pain. Negative for gait problem, joint swelling, myalgias, neck pain and neck stiffness.  Skin: Negative.   Neurological: Positive for dizziness (last 2-3 days). Negative for headaches.       Objective:   Physical Exam  Abdominal: Soft. Bowel sounds are normal. She exhibits no distension and no mass. There is tenderness (LLQ). There is guarding. There is no rebound.          Assessment & Plan:  Diarrhea with abdominal pain -Get colonoscopy with Dr. Amedeo Plenty -Get labs CBC, BMP, LFTs -Get on levsin, cipro, flaygl -Bland diet, increase fluids, may need CT scan - go to the ER if it gets worse  Subjective:   Alexandra Lindsey is a 73 y.o. female who presents for Medicare Annual Wellness Visit and 3 month follow up on hypertension, prediabetes***, hyperlipidemia, vitamin D def.  Date of last medicare wellness visit  is unknown.   Her blood pressure {HAS HAS NOT:18834} been controlled at home, today their BP is BP: 102/62 mmHg She denies chest pain, shortness of breath, dizziness.  Her cholesterol is diet controlled. In addition they are on _____  and denies myalgias. Her cholesterol {ACTION; IS/IS GI:087931 controlled. The cholesterol last visit was:   No results found for this basename: CHOL, HDL, LDLCALC, LDLDIRECT, TRIG, CHOLHDL   She {Has/has not:18111} been working on diet and exercise for prediabetes, and has {exam; ros diabetes:5304::"no polyuria or polydipsia","no chest pain, dyspnea or TIAs","no numbness, tingling or pain in extremities"}. Last A1C in the office was:  No results found for this basename: HGBA1C   Patient is on Vitamin D supplement.   Names of Other Physician/Practitioners you currently use: 1. [facility]- here for primary care 2. ***, eye doctor, last visit *** 3. ***, dentist, last visit *** 4. ***, *** Patient Care Team: Unk Pinto, MD as PCP - General (Internal Medicine) Missy Sabins, MD as Consulting Physician (Gastroenterology)  Medical Services you may have received from other than [facility] providers in the past year (date may be approximate) ***  Medication Review Current Outpatient Prescriptions on File Prior to Visit  Medication Sig Dispense Refill  . alendronate (FOSAMAX) 70 MG tablet Take 1 tablet (70 mg total) by mouth every 7 (seven) days. Take with a full glass of water on an empty stomach.  4 tablet  11  . carbidopa-levodopa (SINEMET IR) 25-100 MG per tablet Take 1 tablet by mouth daily.  30 tablet  3  .  carbidopa-levodopa-entacapone (STALEVO 100) 25-100-200 MG per tablet One tablet at 6am, noon and 6pm and one half tablet at 9am and 3pm  120 tablet  3  . cholecalciferol (VITAMIN D) 1000 UNITS tablet Take 5,000 Units by mouth daily.      . clonazepam (KLONOPIN) 0.125 MG disintegrating tablet Take 1 tablet (0.125 mg  total) by mouth at bedtime as needed.  15 tablet  0  . hydrochlorothiazide (HYDRODIURIL) 25 MG tablet Take 25 mg by mouth daily as needed (fluid retention).       . magnesium oxide (MAG-OX) 400 MG tablet Take 400 mg by mouth daily.      . pramipexole (MIRAPEX) 1 MG tablet Take 1 tablet (1 mg total) by mouth 3 (three) times daily.  90 tablet  3  . QUEtiapine (SEROQUEL) 25 MG tablet Take 6.25 mg by mouth at bedtime.      . ranitidine (ZANTAC) 150 MG tablet Take 150 mg by mouth daily as needed. For acid reflex       No current facility-administered medications on file prior to visit.    Current Problems (verified) Patient Active Problem List   Diagnosis Date Noted  . 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 Parkinson's disease   . Chest pain 10/07/2012  . 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 Headache 10/07/2012  . Bursitis of left hip 10/07/2012    Screening Tests Health Maintenance  Topic Date Due  . Tetanus/tdap  04/14/1961  . Colonoscopy  04/14/1992  . Zostavax  04/14/2002  . Pneumococcal Polysaccharide Vaccine Age 46 And Over  04/15/2007  . Influenza Vaccine  06/27/2013  . Mammogram  10/10/2015    Health Maintenance Topics with due status: Overdue     Topic Date Due   TETANUS/TDAP 04/14/1961   COLONOSCOPY 04/14/1992   ZOSTAVAX 04/14/2002   PNEUMOCOCCAL POLYSACCHARIDE VACCINE AGE 97 AND OVER 04/15/2007   INFLUENZA VACCINE 06/27/2013     There is no immunization history on file for this patient.  Preventative care: Last colonoscopy: *** Last mammogram: *** Last pap smear/pelvic exam: ***   DEXA:***  Prior vaccinations: TD or Tdap: ***  Influenza: ***  Pneumococcal: *** Shingles/Zostavax: ***  History reviewed: {history reviewed:20406::"allergies","current medications","past family history","past medical history","past social history","past surgical history","problem list"}  Risk Factors: Osteoporosis: {osteoporosis causes:17871} History of fracture in the past  year: {YES NO:22349}  Tobacco History  Smoking status  . Never Smoker   Smokeless tobacco  . Not on file   She {does/does not:19097} smoke.  Patient {ACTION; IS/IS GQQ:76195093} a former smoker. Are there smokers in your home (other than you)?  {yes/no:20286}  Alcohol Current alcohol use: {history; alcohol:11675}  Caffeine Current caffeine use: {caffeine use:31917}  Exercise Exercise limitations: {Exercise limits:20536} Current exercise: {exercise types:16438}  Nutrition/Diet Current diet: {diet habits:16563}  Cardiac risk factors: {risk factors:510}.  Depression Screen Nurse depression screen reviewed.  (Note: if answer to either of the following is "Yes", a more complete depression screening is indicated)   Q1: Over the past two weeks, have you felt down, depressed or hopeless? {yes/no:20286}  Q2: Over the past two weeks, have you felt little interest or pleasure in doing things? {yes/no:20286}  Have you lost interest or pleasure in daily life? {yes/no:20286}  Do you often feel hopeless? {yes/no:20286}  Do you cry easily over simple problems? {yes/no:20286}  Activities of Daily Living Nurse ADLs screen reviewed.  In your present state of health, do you have any difficulty performing the following activities?:  Driving? {yes/no:20286} Managing money?  {  Responses; yes/no (default no):140031::"No"} Feeding yourself? {yes/no:20286} Getting from bed to chair? {yes/no:20286} Climbing a flight of stairs? {yes/no:20286} Preparing food and eating?: {yes/no (default no):140031::"No"} Bathing or showering? {Responses; yes/no (default no):140031::"No"} Getting dressed: {yes/no (default no):140031::"No"} Getting to the toilet? {Responses; yes/no (default no):140031::"No"} Using the toilet:{yes/no (default no):140031::"No"} Moving around from place to place: {yes/no (default no):140031::"No"} In the past year have you fallen or had a near fall?:{yes/no (default  no):140031::"No"}   Are you sexually active?  {yes/no:20286}  Do you have more than one partner?  {Responses; yes/no (default no):140031::"No"}  Vision Difficulties: {yes/no (default no):140031::"No"}  Hearing Difficulties: {yes/no (default no):140031::"No"} Do you often ask people to speak up or repeat themselves? {Responses; yes/no (default no):140031::"No"} Do you experience ringing or noises in your ears? {Responses; yes/no (default no):140031::"No"} Do you have difficulty understanding soft or whispered voices? {Responses; yes/no (default no):140031::"No"}  Cognition  Do you feel that you have a problem with memory? {yes/no:20286}  Do you often misplace items? {yes/no:20286}  Do you feel safe at home?  {yes/no:20286}  Advanced directives Does patient have a Health Care Power of Attorney? {yes/no:20286} Does patient have a Living Will? {yes/no:20286}    Objective:     Vision and hearing screens reviewed.   Blood pressure 102/62, pulse 72, temperature 97.9 F (36.6 C), resp. rate 16, height 4' 11.5" (1.511 m), weight 137 lb (62.143 kg). Body mass index is 27.22 kg/(m^2).  General appearance: alert, no distress, WD/WN,  female Cognitive Testing  Alert? {yes/no:20286}  Normal Appearance?{yes/no:20286}  Oriented to person? {yes/no:20286}  Place? {yes/no:20286}   Time? {yes/no:20286}  Recall of three objects?  {yes/no:20286}  Can perform simple calculations? {yes/no:20286}  Displays appropriate judgment?{yes/no:20286}  Can read the correct time from a watch face?{yes/no:20286}  HEENT: normocephalic, sclerae anicteric, TMs pearly, nares patent, no discharge or erythema, pharynx normal Oral cavity: MMM, no lesions Neck: supple, no lymphadenopathy, no thyromegaly, no masses Heart: RRR, normal S1, S2, no murmurs Lungs: CTA bilaterally, no wheezes, rhonchi, or rales Abdomen: +bs, soft, non tender, non distended, no masses, no hepatomegaly, no splenomegaly Musculoskeletal:  nontender, no swelling, no obvious deformity Extremities: no edema, no cyanosis, no clubbing Pulses: 2+ symmetric, upper and lower extremities, normal cap refill Neurological: alert, oriented x 3, CN2-12 intact, strength normal upper extremities and lower extremities, sensation normal throughout, DTRs 2+ throughout, no cerebellar signs, gait normal Psychiatric: normal affect, behavior normal, pleasant  Breast: nontender, no masses or lumps, no skin changes, no nipple discharge or inversion, no axillary lymphadenopathy Gyn: Normal external genitalia without lesions, vagina with normal mucosa, cervix without lesions, no cervical motion tenderness, no abnormal vaginal discharge.  Uterus and adnexa not enlarged, nontender, no masses.  Pap performed.   Rectal:    Assessment:     Plan:   During the course of the visit the patient was educated and counseled about appropriate screening and preventive services including:    {plan:19836}  Screening recommendations, referrals:  Vaccinations: Tdap vaccine {Responses; yes/no/not done:10828}  Influenza vaccine {Responses; yes/no/not done:10828} Pneumococcal vaccine {Responses; yes/no/not done:10828} Shingles vaccine {Responses; yes/no/not done:10828} Hep B vaccine {Responses; yes/no/not done:10828}  Nutrition assessed and recommended  Colonoscopy {Responses; yes/no/not done:10828} Mammogram {Responses; yes/no/not done:10828} Pap smear {Responses; yes/no/not done:10828} Pelvic exam {Responses; yes/no/not done:10828} Recommended yearly ophthalmology/optometry visit for glaucoma screening and checkup Recommended yearly dental visit for hygiene and checkup Advanced directives - {Responses; yes/no/not done:10828}  Conditions/risks identified: BMI: Discussed weight loss, diet, and increase physical activity.  Increase physical activity: AHA recommends 150 minutes of  physical activity a week.  Medications reviewed DEXA- {not  indicated/requested/declined:14582} Diabetes {ACTION; IS/IS TAV:69794801} at goal, ACE/ARB therapy: {Ace Inhibitor Therapy:20833} RA- on DMARD*** Urinary Incontinence {ACTION; IS/IS KPV:37482707} an issue: discussed non pharmacology and pharmacology options.  Fall risk: {Desc; low/moderate/high:110033}- discussed PT, home fall assessment, medications.   Medicare Attestation I have personally reviewed: The patient's medical and social history Their use of alcohol, tobacco or illicit drugs Their current medications and supplements The patient's functional ability including ADLs,fall risks, home safety risks, cognitive, and hearing and visual impairment Diet and physical activities Evidence for depression or mood disorders  The patient's weight, height, BMI, and visual acuity have been recorded in the chart.  I have made referrals, counseling, and provided education to the patient based on review of the above and I have provided the patient with a written personalized care plan for preventive services.     Vicie Mutters, PA-C   01/26/2014   ***DELETE Other referrals - social services, PT, meals, transportation, nutrition therapy, weight loss, exercise, falls, tobacco, pap, pelvic CPT [phone] first AWV CPT [phone] subsequent AWV

## 2014-01-27 ENCOUNTER — Telehealth: Payer: Self-pay | Admitting: Neurology

## 2014-01-27 NOTE — Telephone Encounter (Signed)
Patient requesting to be seen sooner than her 3/23 appointment.  She is having severe pain in her left buttocks. She has had an epidural shot 2 months ago at [facility] but it did not last. Please call to advise.

## 2014-01-27 NOTE — Telephone Encounter (Signed)
I called pt back and she is asking to see sooner for L buttock pain (thinking sciatia).  Pt had received epidural and this helped for a little bit.  I told her I did not have sooner appt availibility for OV, but will place on her waitlist.  Recommended that she speak with Dr. French Ana.  Dr. Rexene Alberts treating her for PD.

## 2014-01-28 ENCOUNTER — Ambulatory Visit: Payer: Self-pay | Admitting: Physician Assistant

## 2014-01-28 DIAGNOSIS — M999 Biomechanical lesion, unspecified: Secondary | ICD-10-CM | POA: Diagnosis not present

## 2014-01-28 DIAGNOSIS — M5137 Other intervertebral disc degeneration, lumbosacral region: Secondary | ICD-10-CM | POA: Diagnosis not present

## 2014-01-28 DIAGNOSIS — M62838 Other muscle spasm: Secondary | ICD-10-CM | POA: Diagnosis not present

## 2014-01-28 DIAGNOSIS — IMO0002 Reserved for concepts with insufficient information to code with codable children: Secondary | ICD-10-CM | POA: Diagnosis not present

## 2014-01-29 DIAGNOSIS — M5137 Other intervertebral disc degeneration, lumbosacral region: Secondary | ICD-10-CM | POA: Diagnosis not present

## 2014-01-29 DIAGNOSIS — IMO0002 Reserved for concepts with insufficient information to code with codable children: Secondary | ICD-10-CM | POA: Diagnosis not present

## 2014-01-29 DIAGNOSIS — M62838 Other muscle spasm: Secondary | ICD-10-CM | POA: Diagnosis not present

## 2014-01-29 DIAGNOSIS — M999 Biomechanical lesion, unspecified: Secondary | ICD-10-CM | POA: Diagnosis not present

## 2014-01-30 DIAGNOSIS — M5137 Other intervertebral disc degeneration, lumbosacral region: Secondary | ICD-10-CM | POA: Diagnosis not present

## 2014-01-30 DIAGNOSIS — M62838 Other muscle spasm: Secondary | ICD-10-CM | POA: Diagnosis not present

## 2014-01-30 DIAGNOSIS — IMO0002 Reserved for concepts with insufficient information to code with codable children: Secondary | ICD-10-CM | POA: Diagnosis not present

## 2014-01-30 DIAGNOSIS — M999 Biomechanical lesion, unspecified: Secondary | ICD-10-CM | POA: Diagnosis not present

## 2014-02-02 DIAGNOSIS — IMO0002 Reserved for concepts with insufficient information to code with codable children: Secondary | ICD-10-CM | POA: Diagnosis not present

## 2014-02-02 DIAGNOSIS — M999 Biomechanical lesion, unspecified: Secondary | ICD-10-CM | POA: Diagnosis not present

## 2014-02-02 DIAGNOSIS — M5137 Other intervertebral disc degeneration, lumbosacral region: Secondary | ICD-10-CM | POA: Diagnosis not present

## 2014-02-02 DIAGNOSIS — M62838 Other muscle spasm: Secondary | ICD-10-CM | POA: Diagnosis not present

## 2014-02-03 DIAGNOSIS — M62838 Other muscle spasm: Secondary | ICD-10-CM | POA: Diagnosis not present

## 2014-02-03 DIAGNOSIS — M5137 Other intervertebral disc degeneration, lumbosacral region: Secondary | ICD-10-CM | POA: Diagnosis not present

## 2014-02-03 DIAGNOSIS — IMO0002 Reserved for concepts with insufficient information to code with codable children: Secondary | ICD-10-CM | POA: Diagnosis not present

## 2014-02-03 DIAGNOSIS — M999 Biomechanical lesion, unspecified: Secondary | ICD-10-CM | POA: Diagnosis not present

## 2014-02-05 DIAGNOSIS — M5137 Other intervertebral disc degeneration, lumbosacral region: Secondary | ICD-10-CM | POA: Diagnosis not present

## 2014-02-05 DIAGNOSIS — M999 Biomechanical lesion, unspecified: Secondary | ICD-10-CM | POA: Diagnosis not present

## 2014-02-05 DIAGNOSIS — M62838 Other muscle spasm: Secondary | ICD-10-CM | POA: Diagnosis not present

## 2014-02-05 DIAGNOSIS — IMO0002 Reserved for concepts with insufficient information to code with codable children: Secondary | ICD-10-CM | POA: Diagnosis not present

## 2014-02-09 DIAGNOSIS — M62838 Other muscle spasm: Secondary | ICD-10-CM | POA: Diagnosis not present

## 2014-02-09 DIAGNOSIS — M999 Biomechanical lesion, unspecified: Secondary | ICD-10-CM | POA: Diagnosis not present

## 2014-02-09 DIAGNOSIS — IMO0002 Reserved for concepts with insufficient information to code with codable children: Secondary | ICD-10-CM | POA: Diagnosis not present

## 2014-02-09 DIAGNOSIS — M5137 Other intervertebral disc degeneration, lumbosacral region: Secondary | ICD-10-CM | POA: Diagnosis not present

## 2014-02-10 DIAGNOSIS — M62838 Other muscle spasm: Secondary | ICD-10-CM | POA: Diagnosis not present

## 2014-02-10 DIAGNOSIS — M5137 Other intervertebral disc degeneration, lumbosacral region: Secondary | ICD-10-CM | POA: Diagnosis not present

## 2014-02-10 DIAGNOSIS — IMO0002 Reserved for concepts with insufficient information to code with codable children: Secondary | ICD-10-CM | POA: Diagnosis not present

## 2014-02-10 DIAGNOSIS — M999 Biomechanical lesion, unspecified: Secondary | ICD-10-CM | POA: Diagnosis not present

## 2014-02-11 DIAGNOSIS — M999 Biomechanical lesion, unspecified: Secondary | ICD-10-CM | POA: Diagnosis not present

## 2014-02-11 DIAGNOSIS — M5137 Other intervertebral disc degeneration, lumbosacral region: Secondary | ICD-10-CM | POA: Diagnosis not present

## 2014-02-11 DIAGNOSIS — IMO0002 Reserved for concepts with insufficient information to code with codable children: Secondary | ICD-10-CM | POA: Diagnosis not present

## 2014-02-11 DIAGNOSIS — M62838 Other muscle spasm: Secondary | ICD-10-CM | POA: Diagnosis not present

## 2014-02-12 DIAGNOSIS — M5137 Other intervertebral disc degeneration, lumbosacral region: Secondary | ICD-10-CM | POA: Diagnosis not present

## 2014-02-12 DIAGNOSIS — M62838 Other muscle spasm: Secondary | ICD-10-CM | POA: Diagnosis not present

## 2014-02-12 DIAGNOSIS — M999 Biomechanical lesion, unspecified: Secondary | ICD-10-CM | POA: Diagnosis not present

## 2014-02-12 DIAGNOSIS — IMO0002 Reserved for concepts with insufficient information to code with codable children: Secondary | ICD-10-CM | POA: Diagnosis not present

## 2014-02-13 DIAGNOSIS — IMO0002 Reserved for concepts with insufficient information to code with codable children: Secondary | ICD-10-CM | POA: Diagnosis not present

## 2014-02-13 DIAGNOSIS — M999 Biomechanical lesion, unspecified: Secondary | ICD-10-CM | POA: Diagnosis not present

## 2014-02-13 DIAGNOSIS — M5137 Other intervertebral disc degeneration, lumbosacral region: Secondary | ICD-10-CM | POA: Diagnosis not present

## 2014-02-13 DIAGNOSIS — M62838 Other muscle spasm: Secondary | ICD-10-CM | POA: Diagnosis not present

## 2014-02-16 ENCOUNTER — Encounter: Payer: Self-pay | Admitting: Neurology

## 2014-02-16 ENCOUNTER — Ambulatory Visit (INDEPENDENT_AMBULATORY_CARE_PROVIDER_SITE_OTHER): Payer: Medicare Other | Admitting: Neurology

## 2014-02-16 VITALS — BP 123/66 | HR 85 | Temp 97.0°F | Ht 59.5 in | Wt 138.0 lb

## 2014-02-16 DIAGNOSIS — M545 Low back pain, unspecified: Secondary | ICD-10-CM

## 2014-02-16 DIAGNOSIS — G4752 REM sleep behavior disorder: Secondary | ICD-10-CM | POA: Diagnosis not present

## 2014-02-16 DIAGNOSIS — M999 Biomechanical lesion, unspecified: Secondary | ICD-10-CM | POA: Diagnosis not present

## 2014-02-16 DIAGNOSIS — M5137 Other intervertebral disc degeneration, lumbosacral region: Secondary | ICD-10-CM | POA: Diagnosis not present

## 2014-02-16 DIAGNOSIS — R279 Unspecified lack of coordination: Secondary | ICD-10-CM

## 2014-02-16 DIAGNOSIS — G249 Dystonia, unspecified: Secondary | ICD-10-CM

## 2014-02-16 DIAGNOSIS — G20 Parkinson's disease: Secondary | ICD-10-CM

## 2014-02-16 DIAGNOSIS — IMO0002 Reserved for concepts with insufficient information to code with codable children: Secondary | ICD-10-CM | POA: Diagnosis not present

## 2014-02-16 DIAGNOSIS — M62838 Other muscle spasm: Secondary | ICD-10-CM | POA: Diagnosis not present

## 2014-02-16 MED ORDER — CARBIDOPA-LEVODOPA 25-100 MG PO TABS: 1.0000 | ORAL_TABLET | Freq: Every day | ORAL | Status: DC | PRN

## 2014-02-16 NOTE — Patient Instructions (Signed)
I think your Parkinson's disease has remained fairly stable, which is reassuring. Your back pain is not from the Parkinson's, but involuntary movements can make your back pain worse.  Nevertheless, as you know, this disease does progress with time. It can affect your balance, your memory, your mood, your bowel and bladder function, your posture, balance and walking. Overall you are doing fairly well but I do want to suggest a few things today:  Remember to drink plenty of fluid, eat healthy meals and do not skip any meals. Try to eat protein with a every meal and eat a healthy snack such as fruit or nuts in between meals. Try to keep a regular sleep-wake schedule and try to exercise daily, particularly in the form of walking, 20-30 minutes a day, if you can.   Taking your medication on schedule is key.   Try to stay active physically and mentally. Engage in social activities in your community and with your family and try to keep up with current events by reading the newspaper or watching the news. Try to do word puzzles and you may like to do word puzzles and brain games on the computer such as on [email or web address]   As far as your medications are concerned, I would like to suggest that you take your current medication with the following additional changes: no changes.      As far as diagnostic testing, I will order: no new test needed.   I would like to see you back in 6 months, sooner if we need to. Please call us with any interim questions, concerns, problems, updates or refill requests.  Please also call us for any test results so we can go over those with you on the phone. Our nursing staff will answer any of your questions and relay your messages to me and also relay most of my messages to you.  Our phone number is [phone]. We also have an after hours call service for urgent matters and there is a physician on-call for urgent questions, that cannot wait till the next work day. For any  emergencies you know to call 911 or go to the nearest emergency room.

## 2014-02-16 NOTE — Progress Notes (Signed)
Subjective:    Patient ID: MCKINZY FULLER is a 73 y.o. female.  HPI    Interim history:   Ms. Creson is a pleasant 73 year old right-handed woman with an underlying medical history of hyperlipidemia, who presents for followup consultation of her Parkinson's disease. She is unaccompanied today. 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 I continued her long-standing, left-sided predominant, akinetic-rigid type Parkinson's disease, complicated by RBD, radiating back pain, and gait disorder and history of fall some 6 months ago without injury. The patient is unaccompanied today. I last saw her on 08/18/2013, at which time I felt she was stable. She reported falling on grass the day before last visit. She was playing badminton with her children at the time. I did add a small dose of clonazepam for her RBD for which she has been on Seroquel in low dose. I continued her other medications including Mirapex, Stalevo and Sinemet. In the interim, on 11/13/2013 she had a left epidural injection at L4-5 for left sided radicular pain. She called earlier this month reporting that it did not help. She called earlier this month reporting left-sided radiating pain into the L gluteal region.   Today, she reports that she started having LBP in 11/14 after lifting 2 mattresses. She saw ortho, and had a shot in the R hip, which did not help. She had about 5 falls in the last year. She had an epidural injection, which lasted about 2 months, but the shot was very painful. She started seeing a chiropractor, Dr. Clovis Riley this month and his treatment has been very helpful. She stopped seroquel and clonazepam. Her RBD is stable. She feels her Parkinson's disease is overall stable. She has noticed some intermittent involuntary movements. She was wondering if her back pain was related to her Parkinson's disease.  She previously followed with Dr. Morene Antu and was  last seen by him on 10/07/2012 at which time he added Stalevo at 9 AM and 3 PM. She has had daytime somnolence and fell asleep driving some 2 1/2 years ago. Her Parkinson's diagnosis goes back to May 2002. Her initial symptoms consisted of slowness and stiffness and balance problems. She never had much in the way of tremors. She has left-sided more than right-sided symptoms. She was started on Mirapex in 2002 and then Stalevo about 3 or 3-1/2 years later. Sinemet was added. She has a history of RBD and has tried clonazepam and other benzodiazepines. She has had leg pains at night and started magnesium. She had a sleep study many years ago confirming RBD. She tried Seroquel at 1/2 pill, but could not tolerate it. She has been takinge 1/4 pill nightly, and while it helps her sleep, it does not help her dream enactments.   Her Past Medical History Is Significant For: Past Medical History  Diagnosis Date  . Hyperlipidemia 10-07-12  . Parkinson's disease 10-07-12  . 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 Never smoked tobacco   . Spinal stenosis   . Costochondritis     Her Past Surgical History Is Significant For: Past Surgical History  Procedure Laterality Date  . Abdominal hysterectomy    . Hernia repair    . Rotator cuff repair N/A 10-07-12  . Bunionectomy N/A 11-11-3  . Cardiac catheterization Bilateral 2013    Her Family History Is Significant For: No family history on file.  Her Social History Is Significant For: History  Social History  . Marital Status: Married    Spouse Name: N/A    Number of Children: N/A  . Years of Education: N/A   Social History Main Topics  . Smoking status: Never Smoker   . Smokeless tobacco: None  . Alcohol Use: No  . Drug Use: No  . Sexual Activity: None   Other Topics Concern  . None   Social History Narrative  . None    Her Allergies Are:  Allergies  Allergen Reactions  . Selegiline Hcl Rash  . Codeine Nausea Only    dizziness   . Cephalosporins Other (See Comments)    Cannot sleep  . Darvocet [Propoxyphene N-Acetaminophen] Nausea And Vomiting  . Levaquin [Levofloxacin] Hives  . Tramadol Nausea And Vomiting  . Triamcinolone Itching  . Macrobid [Nitrofurantoin Macrocrystal] Swelling and Rash  . Penicillins Rash  :   Her Current Medications Are:  Outpatient Encounter Prescriptions as of 02/16/2014  Medication Sig  . alendronate (FOSAMAX) 70 MG tablet Take 1 tablet (70 mg total) by mouth every 7 (seven) days. Take with a full glass of water on an empty stomach.  . carbidopa-levodopa (SINEMET IR) 25-100 MG per tablet Take 1 tablet by mouth daily.  . carbidopa-levodopa-entacapone (STALEVO 100) 25-100-200 MG per tablet One tablet at 6am, noon and 6pm and one half tablet at 9am and 3pm  . cholecalciferol (VITAMIN D) 1000 UNITS tablet Take 5,000 Units by mouth daily.  . hydrochlorothiazide (HYDRODIURIL) 25 MG tablet Take 25 mg by mouth daily as needed (fluid retention).   . hyoscyamine (LEVSIN/SL) 0.125 MG SL tablet Place 1 tablet (0.125 mg total) under the tongue every 4 (four) hours as needed for cramping (nausea, diarrhea).  . Magnesium Oxide 500 MG TABS Take 1 tablet by mouth daily.  . Melatonin 5 MG TABS Take 1 tablet by mouth at bedtime as needed and may repeat dose one time if needed.  . pramipexole (MIRAPEX) 1 MG tablet Take 1 tablet (1 mg total) by mouth 3 (three) times daily.  . ranitidine (ZANTAC) 150 MG tablet Take 150 mg by mouth daily as needed. For acid reflex  . clonazepam (KLONOPIN) 0.125 MG disintegrating tablet Take 1 tablet (0.125 mg total) by mouth at bedtime as needed.  [name] QUEtiapine (SEROQUEL) 25 MG tablet Take 6.25 mg by mouth at bedtime.  . [DISCONTINUED] doxycycline (VIBRAMYCIN) 100 MG capsule 1 bid x 3 days then 1 qd with food  . [DISCONTINUED] magnesium oxide (MAG-OX) 400 MG tablet Take 500 mg by mouth daily.   . [DISCONTINUED] metroNIDAZOLE (FLAGYL) 500 MG tablet Take 1 tablet (500 mg total)  by mouth 3 (three) times daily.  :  Review of Systems:  Out of a complete 14 point review of systems, all are reviewed and negative with the exception of these symptoms as listed below:   Review of Systems  Constitutional: Negative.   HENT: Negative.   Eyes: Negative.   Respiratory: Negative.   Cardiovascular: Positive for chest pain.  Gastrointestinal: Negative.   Endocrine: Negative.   Genitourinary: Negative.   Musculoskeletal: Positive for arthralgias and back pain.  Skin: Negative.   Allergic/Immunologic: Negative.   Neurological: Negative.   Hematological: Negative.   Psychiatric/Behavioral: Positive for sleep disturbance (frequent waking, snoring, sleep talking).    Objective:  Neurologic Exam  Physical Exam Physical Examination:   Filed Vitals:   02/16/14 1214  BP: 123/66  Pulse: 85  Temp: 97 F (36.1 C)   General Examination: The patient is a very  pleasant 73 y.o. female in no acute distress. She is well-dressed.   HEENT: Normocephalic, atraumatic, pupils are equal, round and reactive to light and accommodation. Extraocular tracking is good without nystagmus noted. Normal smooth pursuit is noted. Hearing is grossly intact. Face is symmetric with mild facial masking and normal facial sensation. She has no swelling or tenderness in the back of her head. Speech is clear with no dysarthria noted. There is no lip, neck or jaw tremor. Neck is fairly supple with full range of motion. Oropharynx exam reveals normal findings. No significant airway crowding is noted. Mallampati is class I. Tongue protrudes centrally and palate elevates symmetrically.  Chest: is clear to auscultation without wheezing, rhonchi or crackles noted.  Heart: sounds are normal without murmurs, rubs or gallops noted.  Abdomen: is soft, non-tender and non-distended with normal bowel sounds appreciated on auscultation.  Extremities: There is no pitting edema in the distal lower extremities  bilaterally. Pedal pulses are intact.  Skin: is warm and dry with no trophic changes noted.  Musculoskeletal: exam reveals no obvious joint deformities, tenderness or joint swelling or erythema, with the exception of low back pain upon palpation. This is in the very low end of her back across the lower back bilaterally.  Neurologically: Mental status: The patient is awake, alert and oriented in all 4 spheres. Her Memory, attention, language and knowledge are appropriate. There is no aphasia, agnosia, apraxia or anomia. Affect is normal. Cranial nerves are as described above under HEENT exam. In addition, shoulder shrug is normal with equal shoulder height noted. She has mild generalized dyskinesias, intermittent, new from last time.  Motor exam: Normal bulk, and strength is noted. Tone is minimally increased on the L only. There is no drift, tremor or rebound. Romberg is negative. Reflexes are 2+ throughout. Fine motor skills shows mild impairment in finger taps left more so than right. She has normal rapid alternating patting. Foot agility and foot taps are normal on the right and very mildly impaired on the left. She has no action or postural tremor. Cerebellar testing shows no dysmetria or intention tremor on finger to nose testing. There is no truncal or gait ataxia.  Sensory exam is intact to light touch. She stands up well and posture is age-appropriate. She walks with good stride length and pace but decreased arm swing on the left. She has a subtle difficulty with turn but balance is unremarkable.     Assessment and Plan:  In summary, KIRRAH MUSTIN is a very pleasant 73 year old female with an over 12 year history of Parkinson's disease, left-sided predominant, akinetic-rigid type, complicated by RBD, recent onset of dyskinesias, and recent low back pain. She has fallen a few times in the last year. Her low back pain has actually improved most recently this month after she started having  treatment under Dr. Clovis Riley in chiropractic medicine. She is overall stable as far as her Parkinson's disease is concerned but I do detect new mild intermittent dyskinesias. I have strongly advised her not to lift anything heavy or climb on any thing. She has fallen one time last year as she was hanging up curtains and she stepped onto the back rest of the sofa. She also fell while playing badminton and while teaching her grandchild to ride the bike. Her RBD is stable. She could not tolerate Seroquel or low-dose clonazepam. She has a tendency to be sensitive to medications. I did not suggest any new medications and we should consider any  increase in her levodopa treatment with care because her dyskinesias might get worse. Down the Road we can try her on low dose amantadine but I would not like to rock the boat too much today. She was in agreement. We talked about continuing with a healthy lifestyle in general. I encouraged the patient to eat healthy, exercise daily and keep well hydrated, to keep a scheduled bedtime and wake time routine, to not skip any meals and eat healthy snacks in between meals. She did not need refills on Mirapex or Stalevo, and I renewed the prescription for Sinemet once daily but suggested that she take it only as needed. I would like to see her back in 6 months, sooner if the need arises and encouraged her to call with any interim questions, concerns, problems or updates and refill requests. She was in agreement.

## 2014-02-17 DIAGNOSIS — IMO0002 Reserved for concepts with insufficient information to code with codable children: Secondary | ICD-10-CM | POA: Diagnosis not present

## 2014-02-17 DIAGNOSIS — M999 Biomechanical lesion, unspecified: Secondary | ICD-10-CM | POA: Diagnosis not present

## 2014-02-17 DIAGNOSIS — M62838 Other muscle spasm: Secondary | ICD-10-CM | POA: Diagnosis not present

## 2014-02-17 DIAGNOSIS — M5137 Other intervertebral disc degeneration, lumbosacral region: Secondary | ICD-10-CM | POA: Diagnosis not present

## 2014-02-19 DIAGNOSIS — IMO0002 Reserved for concepts with insufficient information to code with codable children: Secondary | ICD-10-CM | POA: Diagnosis not present

## 2014-02-19 DIAGNOSIS — M5137 Other intervertebral disc degeneration, lumbosacral region: Secondary | ICD-10-CM | POA: Diagnosis not present

## 2014-02-19 DIAGNOSIS — M62838 Other muscle spasm: Secondary | ICD-10-CM | POA: Diagnosis not present

## 2014-02-19 DIAGNOSIS — M999 Biomechanical lesion, unspecified: Secondary | ICD-10-CM | POA: Diagnosis not present

## 2014-02-23 DIAGNOSIS — IMO0002 Reserved for concepts with insufficient information to code with codable children: Secondary | ICD-10-CM | POA: Diagnosis not present

## 2014-02-23 DIAGNOSIS — M62838 Other muscle spasm: Secondary | ICD-10-CM | POA: Diagnosis not present

## 2014-02-23 DIAGNOSIS — M5137 Other intervertebral disc degeneration, lumbosacral region: Secondary | ICD-10-CM | POA: Diagnosis not present

## 2014-02-23 DIAGNOSIS — M999 Biomechanical lesion, unspecified: Secondary | ICD-10-CM | POA: Diagnosis not present

## 2014-02-24 DIAGNOSIS — R197 Diarrhea, unspecified: Secondary | ICD-10-CM | POA: Diagnosis not present

## 2014-02-25 DIAGNOSIS — M5137 Other intervertebral disc degeneration, lumbosacral region: Secondary | ICD-10-CM | POA: Diagnosis not present

## 2014-02-25 DIAGNOSIS — M62838 Other muscle spasm: Secondary | ICD-10-CM | POA: Diagnosis not present

## 2014-02-25 DIAGNOSIS — IMO0002 Reserved for concepts with insufficient information to code with codable children: Secondary | ICD-10-CM | POA: Diagnosis not present

## 2014-02-25 DIAGNOSIS — M999 Biomechanical lesion, unspecified: Secondary | ICD-10-CM | POA: Diagnosis not present

## 2014-03-02 DIAGNOSIS — IMO0002 Reserved for concepts with insufficient information to code with codable children: Secondary | ICD-10-CM | POA: Diagnosis not present

## 2014-03-02 DIAGNOSIS — M5137 Other intervertebral disc degeneration, lumbosacral region: Secondary | ICD-10-CM | POA: Diagnosis not present

## 2014-03-02 DIAGNOSIS — M62838 Other muscle spasm: Secondary | ICD-10-CM | POA: Diagnosis not present

## 2014-03-02 DIAGNOSIS — M999 Biomechanical lesion, unspecified: Secondary | ICD-10-CM | POA: Diagnosis not present

## 2014-03-04 ENCOUNTER — Telehealth: Payer: Self-pay | Admitting: Neurology

## 2014-03-04 DIAGNOSIS — M5137 Other intervertebral disc degeneration, lumbosacral region: Secondary | ICD-10-CM | POA: Diagnosis not present

## 2014-03-04 DIAGNOSIS — M999 Biomechanical lesion, unspecified: Secondary | ICD-10-CM | POA: Diagnosis not present

## 2014-03-04 DIAGNOSIS — M62838 Other muscle spasm: Secondary | ICD-10-CM | POA: Diagnosis not present

## 2014-03-04 DIAGNOSIS — IMO0002 Reserved for concepts with insufficient information to code with codable children: Secondary | ICD-10-CM | POA: Diagnosis not present

## 2014-03-04 NOTE — Telephone Encounter (Signed)
Pt called.  She stated that she answered the questionnaire from her last visit.  She is still having lower back and hip pain.  She stated that she has seen an orthopedist and she has also been seeing a chiropractor and that has helped some, but not completely.  She would like to know what the next steps are, should she have an MRI? She will be having a colonoscopy tomorrow so it may be hard to get in touch with her in the morning.  She also stated that she has spoke with Richardson Landry with regard to the problems she is having.  Please call to discuss.  Thank you

## 2014-03-05 DIAGNOSIS — Z09 Encounter for follow-up examination after completed treatment for conditions other than malignant neoplasm: Secondary | ICD-10-CM | POA: Diagnosis not present

## 2014-03-05 DIAGNOSIS — Z86010 Personal history of colonic polyps: Secondary | ICD-10-CM | POA: Diagnosis not present

## 2014-03-05 DIAGNOSIS — K5730 Diverticulosis of large intestine without perforation or abscess without bleeding: Secondary | ICD-10-CM | POA: Diagnosis not present

## 2014-03-06 NOTE — Telephone Encounter (Signed)
Pt called this morning and stated her back wasn't getting any better. She says her pain is about the same as it was on her last visit with Dr. Rexene Alberts. She stated that some days are better than others, however the pain never fully goes away.  She said that it is usually worse in the mornings and its hard for her to get out of bed and walk.  She is wondering if another MRI is needed. Please call to discuss.  Thank you.

## 2014-03-06 NOTE — Telephone Encounter (Signed)
Pt returned Donna's call. She wanted to thank Butch Penny for forwarding her message on to Dr. Rexene Alberts and asks if Dr. Rexene Alberts could call her and if it was possible for her to come in some time on Monday to discuss the back pain more.  Please call to advise.  Thank you.

## 2014-03-06 NOTE — Telephone Encounter (Signed)
Left message to see if anyone has been treating her back pain and relayed that I would forward her message to the doctor for her suggestions.

## 2014-03-06 NOTE — Telephone Encounter (Signed)
She reported to me that her back pain was better since seeing her chiropractor. She was also seen in orthopedics. She may have to see her orthopedic doctor again who would be the best to make a decision regarding her MRI.

## 2014-03-09 ENCOUNTER — Telehealth: Payer: Self-pay | Admitting: Neurology

## 2014-03-09 DIAGNOSIS — M545 Low back pain, unspecified: Secondary | ICD-10-CM

## 2014-03-09 NOTE — Telephone Encounter (Signed)
I spoke with the patient and relayed Dr Guadelupe Sabin advice to see her orthopedist.  She stated that she had spoken to Dr Jannifer Franklin and he advised her to have a second epidural.  I advised her to follow Dr. Tobey Grim advice; she agreed and said that she would let us know how it works.  She wanted to know if she may be referred to a neurosurgeon if she does not get relief from the epidural; I explained to her that it "may" be addressed if the epidural is not effective.

## 2014-03-09 NOTE — Telephone Encounter (Signed)
I called patient. The patient has had chronic issues with left-sided back and hip pain. In the past, Dr. French Ana has followed her for this. An epidural steroid injection done 2 months ago was helpful. The patient reports pain with weightbearing, goes away with sitting or lying down. The pain does not go down the leg. The patient has had a MRI of the lumbosacral spine apparently done in November 2014, and she has had prior MRI evaluation of the left hip. I'll get another epidural steroid injection set up for her.

## 2014-03-10 ENCOUNTER — Telehealth: Payer: Self-pay | Admitting: Neurology

## 2014-03-10 NOTE — Telephone Encounter (Signed)
Pt is calling in reference to the epidural steroid injection, pt is wanting to see how soon she can get in to get this injection. I see where Dr. Jannifer Franklin has a note from pt's previous call. Please call pt concerning this matter. Thanks

## 2014-03-10 NOTE — Telephone Encounter (Signed)
Spoke to patient and explained to her that the referral was sent to Dr. Annamary Rummage office and she stated that she wanted to go to [place] imaging. Talked to [place] and she is redoing the referral to [facility] imaging for the patient. Patient is aware that she will get a call to schedule.

## 2014-03-10 NOTE — Telephone Encounter (Signed)
Pt called back and states she spoke with Dr. Jannifer Franklin and advised pt that this would be set up with GI and pt is afraid she is going to miss the call. Pt is wanting to get this as soon as possible due to the pain pt is having. Please call pt concerning this matter and I have updated pt's # to call cell. Thanks

## 2014-03-11 ENCOUNTER — Other Ambulatory Visit: Payer: Self-pay | Admitting: Neurology

## 2014-03-11 DIAGNOSIS — M549 Dorsalgia, unspecified: Secondary | ICD-10-CM

## 2014-03-12 ENCOUNTER — Ambulatory Visit
Admission: RE | Admit: 2014-03-12 | Discharge: 2014-03-12 | Disposition: A | Discharge status: Home / Self Care | Payer: Medicare Other | Source: Ambulatory Visit | Attending: Neurology | Admitting: Neurology

## 2014-03-12 DIAGNOSIS — M549 Dorsalgia, unspecified: Secondary | ICD-10-CM

## 2014-03-12 DIAGNOSIS — M5137 Other intervertebral disc degeneration, lumbosacral region: Secondary | ICD-10-CM | POA: Diagnosis not present

## 2014-03-12 MED ORDER — METHYLPREDNISOLONE ACETATE 40 MG/ML INJ SUSP (RADIOLOG
120.0000 mg | Freq: Once | INTRAMUSCULAR | Status: AC
  Administered 2014-03-12: 120 mg via EPIDURAL

## 2014-03-12 MED ORDER — IOHEXOL 180 MG/ML  SOLN
1.0000 mL | Freq: Once | INTRAMUSCULAR | Status: AC | PRN
  Administered 2014-03-12: 1 mL via EPIDURAL

## 2014-03-12 NOTE — Discharge Instructions (Signed)

## 2014-03-23 ENCOUNTER — Other Ambulatory Visit: Payer: Self-pay | Admitting: Neurology

## 2014-03-24 ENCOUNTER — Telehealth: Payer: Self-pay | Admitting: Neurology

## 2014-04-04 DIAGNOSIS — R059 Cough, unspecified: Secondary | ICD-10-CM | POA: Diagnosis not present

## 2014-04-04 DIAGNOSIS — H10029 Other mucopurulent conjunctivitis, unspecified eye: Secondary | ICD-10-CM | POA: Diagnosis not present

## 2014-04-04 DIAGNOSIS — R05 Cough: Secondary | ICD-10-CM | POA: Diagnosis not present

## 2014-04-22 DIAGNOSIS — H2589 Other age-related cataract: Secondary | ICD-10-CM | POA: Diagnosis not present

## 2014-04-22 DIAGNOSIS — M48061 Spinal stenosis, lumbar region without neurogenic claudication: Secondary | ICD-10-CM | POA: Diagnosis not present

## 2014-04-22 DIAGNOSIS — M4310 Spondylolisthesis, site unspecified: Secondary | ICD-10-CM | POA: Diagnosis not present

## 2014-04-22 DIAGNOSIS — M47817 Spondylosis without myelopathy or radiculopathy, lumbosacral region: Secondary | ICD-10-CM | POA: Diagnosis not present

## 2014-04-22 DIAGNOSIS — Z79899 Other long term (current) drug therapy: Secondary | ICD-10-CM | POA: Diagnosis not present

## 2014-04-24 DIAGNOSIS — M4310 Spondylolisthesis, site unspecified: Secondary | ICD-10-CM | POA: Diagnosis not present

## 2014-04-24 DIAGNOSIS — M47817 Spondylosis without myelopathy or radiculopathy, lumbosacral region: Secondary | ICD-10-CM | POA: Diagnosis not present

## 2014-04-24 DIAGNOSIS — Z79899 Other long term (current) drug therapy: Secondary | ICD-10-CM | POA: Diagnosis not present

## 2014-04-30 DIAGNOSIS — M545 Low back pain, unspecified: Secondary | ICD-10-CM | POA: Diagnosis not present

## 2014-05-04 DIAGNOSIS — M545 Low back pain, unspecified: Secondary | ICD-10-CM | POA: Diagnosis not present

## 2014-05-11 DIAGNOSIS — M545 Low back pain, unspecified: Secondary | ICD-10-CM | POA: Diagnosis not present

## 2014-05-12 DIAGNOSIS — M545 Low back pain, unspecified: Secondary | ICD-10-CM | POA: Diagnosis not present

## 2014-05-14 DIAGNOSIS — M545 Low back pain, unspecified: Secondary | ICD-10-CM | POA: Diagnosis not present

## 2014-05-18 DIAGNOSIS — Z79899 Other long term (current) drug therapy: Secondary | ICD-10-CM | POA: Diagnosis not present

## 2014-05-18 DIAGNOSIS — M545 Low back pain, unspecified: Secondary | ICD-10-CM | POA: Diagnosis not present

## 2014-05-21 DIAGNOSIS — M545 Low back pain, unspecified: Secondary | ICD-10-CM | POA: Diagnosis not present

## 2014-05-26 DIAGNOSIS — M545 Low back pain, unspecified: Secondary | ICD-10-CM | POA: Diagnosis not present

## 2014-05-26 DIAGNOSIS — IMO0001 Reserved for inherently not codable concepts without codable children: Secondary | ICD-10-CM | POA: Diagnosis not present

## 2014-05-26 DIAGNOSIS — M47817 Spondylosis without myelopathy or radiculopathy, lumbosacral region: Secondary | ICD-10-CM | POA: Diagnosis not present

## 2014-06-09 DIAGNOSIS — M545 Low back pain, unspecified: Secondary | ICD-10-CM | POA: Diagnosis not present

## 2014-06-09 DIAGNOSIS — G894 Chronic pain syndrome: Secondary | ICD-10-CM | POA: Diagnosis not present

## 2014-06-09 DIAGNOSIS — M48061 Spinal stenosis, lumbar region without neurogenic claudication: Secondary | ICD-10-CM | POA: Diagnosis not present

## 2014-06-22 DIAGNOSIS — T7840XA Allergy, unspecified, initial encounter: Secondary | ICD-10-CM | POA: Diagnosis not present

## 2014-06-22 DIAGNOSIS — H40059 Ocular hypertension, unspecified eye: Secondary | ICD-10-CM | POA: Diagnosis not present

## 2014-06-23 DIAGNOSIS — M47817 Spondylosis without myelopathy or radiculopathy, lumbosacral region: Secondary | ICD-10-CM | POA: Diagnosis not present

## 2014-06-23 DIAGNOSIS — M4310 Spondylolisthesis, site unspecified: Secondary | ICD-10-CM | POA: Diagnosis not present

## 2014-06-23 DIAGNOSIS — M48061 Spinal stenosis, lumbar region without neurogenic claudication: Secondary | ICD-10-CM | POA: Diagnosis not present

## 2014-06-30 ENCOUNTER — Other Ambulatory Visit: Payer: Self-pay | Admitting: Neurology

## 2014-06-30 DIAGNOSIS — R21 Rash and other nonspecific skin eruption: Secondary | ICD-10-CM | POA: Diagnosis not present

## 2014-06-30 DIAGNOSIS — L259 Unspecified contact dermatitis, unspecified cause: Secondary | ICD-10-CM | POA: Diagnosis not present

## 2014-07-03 ENCOUNTER — Other Ambulatory Visit: Payer: Self-pay | Admitting: Internal Medicine

## 2014-07-03 DIAGNOSIS — G20A1 Parkinson's disease without dyskinesia, without mention of fluctuations: Secondary | ICD-10-CM

## 2014-07-03 DIAGNOSIS — G20 Parkinson's disease: Secondary | ICD-10-CM

## 2014-07-27 ENCOUNTER — Ambulatory Visit: Payer: Medicare Other | Admitting: Neurology

## 2014-07-28 NOTE — Telephone Encounter (Signed)
Noted  

## 2014-08-10 ENCOUNTER — Ambulatory Visit: Payer: Medicare Other | Admitting: Neurology

## 2014-08-11 ENCOUNTER — Ambulatory Visit (INDEPENDENT_AMBULATORY_CARE_PROVIDER_SITE_OTHER): Payer: Medicare Other | Admitting: Neurology

## 2014-08-11 ENCOUNTER — Encounter: Payer: Self-pay | Admitting: Neurology

## 2014-08-11 VITALS — BP 88/56 | HR 72 | Ht 60.0 in | Wt 138.0 lb

## 2014-08-11 DIAGNOSIS — G4752 REM sleep behavior disorder: Secondary | ICD-10-CM

## 2014-08-11 DIAGNOSIS — G20 Parkinson's disease: Secondary | ICD-10-CM | POA: Diagnosis not present

## 2014-08-11 DIAGNOSIS — R279 Unspecified lack of coordination: Secondary | ICD-10-CM | POA: Diagnosis not present

## 2014-08-11 DIAGNOSIS — G249 Dystonia, unspecified: Secondary | ICD-10-CM

## 2014-08-11 MED ORDER — CARBIDOPA-LEVODOPA 25-100 MG PO TABS: ORAL_TABLET | ORAL | Status: DC

## 2014-08-11 NOTE — Patient Instructions (Signed)
1. Discontinue Stalevo.   2. Start Carbidopa Levodopa 25/100 IR as follows:  Take 1 tablet (chew) 5:30 AM Take 1 tablet at 6:00 AM Take 1/2 tablet at 9:00 AM Take 1 tablet at 12:00 PM Take 1/2 tablet at 3:00 PM Take 1 tablet at 6:00 pm  3. Continue Mirapex

## 2014-08-11 NOTE — Progress Notes (Signed)
Alexandra Lindsey was seen today in the movement disorders clinic for neurologic consultation at the request of MCKEOWN,WILLIAM DAVID, MD.  The patient has previously seen both Dr. Erling Cruz and Dr. Rexene Alberts.  The records that were available to me were reviewed.  The consultation is for the evaluation and treatment of Parkinson's disease.  The patient has had a diagnosis of Parkinson's disease since at least 2002.  The first symptom was inability to get out of a chair and dragging the L leg and shuffling.  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    The patient was placed on Mirapex in 2002 and remains on that medication.  Once she was started on that medication she could immediately clog again.  She is currently on 1 mg 3 times a day.  She was placed on Stalevo 3 or 4 years later and is currently on Stalevo 100 mg, one tablet at 6 AM, half a tablet at 9 AM, 1 tablet at noon, half a tablet at 3 PM and one tablet at 6 PM.  In addition, she takes a regular carbidopa/levodopa 25/100 in the AM and chews it.  If she doesn't do that, she shuffles.  She chews that pill and then 45 mins later starts the Stalevo regimen.  She does state that Stalevo has become quite expensive for her and her insurance is going to change and asks about alternatives.    Specific Symptoms:  Tremor: No. Voice: no changes per pt but people tell her she talks softly Sleep: gets to sleep well  Vivid Dreams:  Yes.    Acting out dreams:  Yes.   (screams out; 1 time fell out of bed) Wet Pillows: rarely Postural symptoms:  Yes.   (pretty good per pt)  Falls?  Yes.   (fell 6 times last year but was playing badmitten one time and fell) Bradykinesia symptoms: slow movements sometimes; shuffles in the AM Loss of smell:  No. Loss of taste:  No. Urinary Incontinence:  No. Difficulty Swallowing:  No. Handwriting, micrographia: Yes.   but often times its not (takes minutes at  church) Trouble with ADL's:  No.  Trouble buttoning clothing: No. Depression:  No. Memory changes:  No. (some difficulty with name recall) Hallucinations: very rarely  visual distortions: Yes.   N/V:  No. Lightheaded:  No.  Syncope: No. Diplopia:  No. (on seroquel had some diplopia) Dyskinesia:  Yes.      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 Macrobid [Nitrofurantoin Macrocrystal] Swelling and Rash  . Cephalosporins Other (See Comments)    Cannot sleep  . Codeine Nausea Only    dizziness  . Darvocet [Propoxyphene N-Acetaminophen] Nausea And Vomiting  . Levaquin [Levofloxacin] Hives  . Penicillins Rash  . Selegiline Hcl Rash  . Tramadol Nausea And Vomiting  . Triamcinolone Itching    CURRENT MEDICATIONS:  Outpatient Encounter Prescriptions as of 08/11/2014  Medication Sig  . carbidopa-levodopa (SINEMET IR) 25-100 MG per tablet Take 1 tablet by mouth every morning. Chews up at 5:30 am every morning  . carbidopa-levodopa-entacapone (STALEVO) 25-100-200 MG per tablet Take 1 tablet by mouth 3 (three) times daily. 1 tablet at 6:00 am/ 1/2 tablet at 9:00 am/ 1 tablet at 12:00 pm/ 1/2 tablet at  3:00 pm/ 1 tablet at 6:00 pm  . cholecalciferol (VITAMIN D) 1000 UNITS tablet Take 5,000 Units by mouth daily.  . hydrochlorothiazide (HYDRODIURIL) 25 MG tablet Take 25 mg by mouth daily as needed (fluid retention).   . Magnesium Oxide 500 MG TABS Take 1 tablet by mouth daily.  . Melatonin 5 MG TABS Take 1 tablet by mouth at bedtime as needed and may repeat dose one time if needed.  . pramipexole (MIRAPEX) 1 MG tablet TAKE 1 TABLET BY MOUTH 3 TIMES DAILY  . ranitidine (ZANTAC) 150 MG tablet Take 150 mg by mouth daily as needed. For acid reflex  . [DISCONTINUED] alendronate (FOSAMAX) 70 MG tablet Take 1 tablet (70 mg total) by  mouth every 7 (seven) days. Take with a full glass of water on an empty stomach.  . [DISCONTINUED] carbidopa-levodopa (SINEMET IR) 25-100 MG per tablet TAKE 1 TABLET BY MOUTH ONCE DAILY AS NEEDED  . [DISCONTINUED] carbidopa-levodopa-entacapone (STALEVO) 25-100-200 MG per tablet TAKE 1 TABLET BY MOUTH AT 6:00 AM, THEN 1 TABLET AT NOON, THEN 1 TABLET AT 6:00 P.M. AND 1/2 TABLET AT 9:00 AM AND 1/2 TABLET AT 3:00 P.M.  . [DISCONTINUED] clonazepam (KLONOPIN) 0.125 MG disintegrating tablet Take 1 tablet (0.125 mg total) by mouth at bedtime as needed.  . [DISCONTINUED] hyoscyamine (LEVSIN/SL) 0.125 MG SL tablet Place 1 tablet (0.125 mg total) under the tongue every 4 (four) hours as needed for cramping (nausea, diarrhea).  . [DISCONTINUED] QUEtiapine (SEROQUEL) 25 MG tablet Take 6.25 mg by mouth at bedtime.    PAST MEDICAL HISTORY:   Past Medical History  Diagnosis Date  . Hyperlipidemia 10-07-12  . Parkinson's disease 10-07-12  . 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 Never smoked tobacco   . Spinal stenosis   . Costochondritis     PAST SURGICAL HISTORY:   Past Surgical History  Procedure Laterality Date  . Abdominal hysterectomy    . Hernia repair    . Rotator cuff repair Left 10-07-12  . Bunionectomy Bilateral 11-11-3  . Cardiac catheterization Bilateral 2013    SOCIAL HISTORY:   History   Social History  . Marital Status: Married    Spouse Name: N/A    Number of Children: N/A  . Years of Education: N/A   Occupational History  . retired     Chiropractor   Social History Main Topics  . Smoking status: Never Smoker   . Smokeless tobacco: Not on file  . Alcohol Use: No  . Drug Use: No  . Sexual Activity: Not on file   Other Topics Concern  . Not on file   Social History Narrative  . No narrative on file    FAMILY HISTORY:   Family Status  Relation Status Death Age  . Mother Deceased     esophageal cancer, heart attack  . Father Deceased     suicide  .  Brother Deceased     suicide  . Brother Alive     HTN, hypercholesterolemia  . Brother Alive     mental retardation    ROS:  Has LBP - has had 4 epidurals and 3 of them helpful.  Admits to CP - sees cardiology and had heart cath and was told it was costochondritis.    A complete 10 system review of systems was obtained and was unremarkable apart from what is mentioned above.  PHYSICAL EXAMINATION:    VITALS:   Filed Vitals:   08/11/14 0827  BP: 88/56  Pulse:  72  Height: 5' (1.524 m)  Weight: 138 lb (62.596 kg)    GEN:  The patient appears stated age and is in NAD. HEENT:  Normocephalic, atraumatic.  The mucous membranes are moist. The superficial temporal arteries are without ropiness or tenderness. CV:  RRR Lungs:  CTAB Neck/HEME:  There are no carotid bruits bilaterally.  Neurological examination:  Orientation: The patient is alert and oriented x3. Fund of knowledge is appropriate.  Recent and remote memory are intact.  Attention and concentration are normal.    Able to name objects and repeat phrases. Cranial nerves: There is good facial symmetry.  No significant facial hypomimia.  Pupils are equal round and reactive to light bilaterally. Fundoscopic exam reveals clear margins bilaterally. Extraocular muscles are intact.  There are no square wave jerks.  The visual fields are full to confrontational testing. The speech is fluent and clear. Soft palate rises symmetrically and there is no tongue deviation. Hearing is intact to conversational tone. Sensation: Sensation is intact to light and pinprick throughout (facial, trunk, extremities). Vibration is intact at the bilateral big toe but it is decreased. There is no extinction with double simultaneous stimulation. There is no sensory dermatomal level identified. Motor: Strength is 5/5 in the bilateral upper and lower extremities.   Shoulder shrug is equal and symmetric.  There is no pronator drift. Deep tendon reflexes: Deep tendon  reflexes are 2+/4 at the bilateral biceps, triceps, brachioradialis, patella and 2 minus at the bilateral achilles.  There are striatal toes bilaterally.  Movement examination: Tone: There is normal tone in the bilateral upper extremities.  The tone in the lower extremities is normal.  Abnormal movements: The patient is quite dyskinetic, especially in the lower extremities but it is present in the upper extremities as well. Coordination:  There is no significant decremation with RAM's, either in the hands or feet bilaterally Gait and Station: The patient has no significant difficulty arising out of a deep-seated chair without the use of the hands. The patient's stride length is normal, with exaggerated arm swing due to dyskinesia.  The patient has a negative pull test.      ASSESSMENT/PLAN:  1.  Idiopathic Parkinson's disease, by history.  -The patient actually does not look parkinsonian at all today.  I suspect that this is because I saw her in the morning.  She actually looks slightly overmedicated, but again I could be wrong given that this is the first time that I have seen her.  She does have a significant amount of dyskinesia today.  Nonetheless, she had I had a long discussion.  She is splitting several of the Stalevo dosages.  Stalevo should not be split because of the entacapone component.  She actually is finding the Stalevo to be incredibly expensive and is changing insurances so would like to go back to carbidopa/levodopa and if we need the entacapone component we could just add that separately.  I think that is a reasonable approach.  Therefore, I did not change her dosing schedule at all today, but just changed her to all carbidopa/levodopa.  Her schedule will continue carbidopa/levodopa 25/100 one tablet chewed up in the morning followed by carbidopa/levodopa 25/100 one tablet at 6 AM, half a tablet at 9 AM, 1 tablet at noon, half a tablet at 3 PM and one tablet at 6 PM.  I did tell her  that without the entacapone component I will be curious to see if it wears off earlier or if she has  less dyskinesia.  If not, then amantadine is always an option.  I also told her I would be curious some time to see her off of medication.  We did decide to make her an appointment next time in the afternoon so I could see her at a different time of day.  -We talked about the value of safe, cardiovascular exercise.  Greater than 50% of the 60 minute visit spent in counseling. 2.  chronic low back pain.  -She has had epidurals in the past.  She is hooked up with a pain management physician.  I asked her to make a followup appointment with that physician before her pain escalates even further. 3.  REM behavior disorder.  -She thought that this was due to the Mirapex, but I told her that this is likely associated with Parkinson's disease itself.  She did not want to take the clonazepam, primarily because of its addictive properties.  She did try it for a short period of time.  She tried Seroquel as well, but it caused diplopia.  She does not want to try anything further right now. 4.  followup in the next few months, sooner should new neurologic issues arise.

## 2014-08-18 ENCOUNTER — Ambulatory Visit: Payer: Medicare Other | Admitting: Neurology

## 2014-08-24 ENCOUNTER — Telehealth: Payer: Self-pay | Admitting: Neurology

## 2014-08-24 NOTE — Telephone Encounter (Signed)
Pt called requesting to speak to a nurse regarding her condition.  Pt also wants to know if it would be a good idea for her to have another MRI on her left hip.  Please call pt c/b [phone]

## 2014-08-24 NOTE — Telephone Encounter (Signed)
Yes, I think that it is something that she needs to talk with her pain management dr asap so that her pain doesn't get out of control like it did in the past.  I talked to her about this when she was in here as well.  I think that i remember her saying that she had a good relationship with her pain management dr so I would say call that dr and get appt right away to see what options there are other than injections since they aren't lasting

## 2014-08-24 NOTE — Telephone Encounter (Signed)
Patient complaining of back pain. She states she has had several injection since December and they only help for a short period of time she would like to know what you recommend or is this something she should be disguising with pain management. Please advise

## 2014-08-27 DIAGNOSIS — M25552 Pain in left hip: Secondary | ICD-10-CM | POA: Diagnosis not present

## 2014-09-06 DIAGNOSIS — K219 Gastro-esophageal reflux disease without esophagitis: Secondary | ICD-10-CM | POA: Insufficient documentation

## 2014-09-06 DIAGNOSIS — R7309 Other abnormal glucose: Secondary | ICD-10-CM | POA: Insufficient documentation

## 2014-09-06 DIAGNOSIS — E559 Vitamin D deficiency, unspecified: Secondary | ICD-10-CM | POA: Insufficient documentation

## 2014-09-06 DIAGNOSIS — Z79899 Other long term (current) drug therapy: Secondary | ICD-10-CM | POA: Insufficient documentation

## 2014-09-06 DIAGNOSIS — I10 Essential (primary) hypertension: Secondary | ICD-10-CM | POA: Insufficient documentation

## 2014-09-06 NOTE — Patient Instructions (Signed)
Recommend the book "The END of DIETING" by Dr Baker Janus   and the book "The END of DIABETES " by Dr Excell Seltzer  At [email or web address] - get book & Audio CD's      Being diabetic has a  300% increased risk for heart attack, stroke, cancer, and alzheimer- type vascular dementia. It is very important that you work harder with diet by avoiding all foods that are white except chicken & fish. Avoid white rice (brown & wild rice is OK), white potatoes (sweetpotatoes in moderation is OK), White bread or wheat bread or anything made out of white flour like bagels, donuts, rolls, buns, biscuits, cakes, pastries, cookies, pizza crust, and pasta (made from white flour & egg whites) - vegetarian pasta or spinach or wheat pasta is OK. Multigrain breads like Arnold's or [employer], or multigrain sandwich thins or flatbreads.  Diet, exercise and weight loss can reverse and cure diabetes in the early stages.  Diet, exercise and weight loss is very important in the control and prevention of complications of diabetes which affects every system in your body, ie. Brain - dementia/stroke, eyes - glaucoma/blindness, heart - heart attack/heart failure, kidneys - dialysis, stomach - gastric paralysis, intestines - malabsorption, nerves - severe painful neuritis, circulation - gangrene & loss of a leg(s), and finally cancer and Alzheimers.    I recommend avoid fried & greasy foods,  sweets/candy, white rice (brown or wild rice or Quinoa is OK), white potatoes (sweet potatoes are OK) - anything made from white flour - bagels, doughnuts, rolls, buns, biscuits,white and wheat breads, pizza crust and traditional pasta made of white flour & egg white(vegetarian pasta or spinach or wheat pasta is OK).  Multi-grain bread is OK - like multi-grain flat bread or sandwich thins. Avoid alcohol in excess. Exercise is also important.    Eat all the vegetables you want - avoid meat, especially red meat and dairy - especially cheese.  Cheese  is the most concentrated form of trans-fats which is the worst thing to clog up our arteries. Veggie cheese is OK which can be found in the fresh produce section at [employer] or [employer] or [employer]  Preventive Care for Adults A healthy lifestyle and preventive care can promote health and wellness. Preventive health guidelines for women include the following key practices.  A routine yearly physical is a good way to check with your health care provider about your health and preventive screening. It is a chance to share any concerns and updates on your health and to receive a thorough exam.  Visit your dentist for a routine exam and preventive care every 6 months. Brush your teeth twice a day and floss once a day. Good oral hygiene prevents tooth decay and gum disease.  The frequency of eye exams is based on your age, health, family medical history, use of contact lenses, and other factors. Follow your health care provider's recommendations for frequency of eye exams.  Eat a healthy diet. Foods like vegetables, fruits, whole grains, low-fat dairy products, and lean protein foods contain the nutrients you need without too many calories. Decrease your intake of foods high in solid fats, added sugars, and salt. Eat the right amount of calories for you.Get information about a proper diet from your health care provider, if necessary.  Regular physical exercise is one of the most important things you can do for your health. Most adults should get at least 150 minutes of moderate-intensity exercise (any activity that increases  your heart rate and causes you to sweat) each week. In addition, most adults need muscle-strengthening exercises on 2 or more days a week.  Maintain a healthy weight. The body mass index (BMI) is a screening tool to identify possible weight problems. It provides an estimate of body fat based on height and weight. Your health care provider can find your BMI and can help you  achieve or maintain a healthy weight.For adults 20 years and older:  A BMI below 18.5 is considered underweight.  A BMI of 18.5 to 24.9 is normal.  A BMI of 25 to 29.9 is considered overweight.  A BMI of 30 and above is considered obese.  Maintain normal blood lipids and cholesterol levels by exercising and minimizing your intake of saturated fat. Eat a balanced diet with plenty of fruit and vegetables. Blood tests for lipids and cholesterol should begin at age 61 and be repeated every 5 years. If your lipid or cholesterol levels are high, you are over 50, or you are at high risk for heart disease, you may need your cholesterol levels checked more frequently.Ongoing high lipid and cholesterol levels should be treated with medicines if diet and exercise are not working.  If you smoke, find out from your health care provider how to quit. If you do not use tobacco, do not start.  Lung cancer screening is recommended for adults aged 55-80 years who are at high risk for developing lung cancer because of a history of smoking. A yearly low-dose CT scan of the lungs is recommended for people who have at least a 30-pack-year history of smoking and are a current smoker or have quit within the past 15 years. A pack year of smoking is smoking an average of 1 pack of cigarettes a day for 1 year (for example: 1 pack a day for 30 years or 2 packs a day for 15 years). Yearly screening should continue until the smoker has stopped smoking for at least 15 years. Yearly screening should be stopped for people who develop a health problem that would prevent them from having lung cancer treatment.  High blood pressure causes heart disease and increases the risk of stroke. Ongoing high blood pressure should be treated with medicines if weight loss and exercise do not work.  If you are 52-61 years old, ask your health care provider if you should take aspirin to prevent strokes.  Diabetes screening involves taking a  blood sample to check your fasting blood sugar level.  Testing should be considered at a younger age or be carried out more frequently if you are overweight and have at least 1 risk factor for diabetes.  Breast cancer screening is essential preventive care for women. You should practice "breast self-awareness." This means understanding the normal appearance and feel of your breasts and may include breast self-examination. Any changes detected, no matter how small, should be reported to a health care provider.  After age 71, women should have a Breast Exam every year. Starting at age 48, women should consider having a mammogram (breast X-ray test) every year. Women who have a family history of breast cancer should talk to their health care provider about genetic screening. Women at a high risk of breast cancer should talk to their health care providers about having an MRI and a mammogram every year.  Breast cancer gene (BRCA)-related cancer risk assessment is recommended for women who have family members with BRCA-related cancers. BRCA-related cancers include breast, ovarian, tubal, and peritoneal cancers.  Having family members with these cancers may be associated with an increased risk for harmful changes (mutations) in the breast cancer genes BRCA1 and BRCA2. Results of the assessment will determine the need for genetic counseling and BRCA1 and BRCA2 testing.  Routine pelvic exams to screen for cancer are no longer recommended for nonpregnant women who are considered low risk for cancer of the pelvic organs (ovaries, uterus, and vagina) and who do not have symptoms. Ask your health care provider if a screening pelvic exam is right for you.  If you have had past treatment for cervical cancer or a condition that could lead to cancer, you need Pap tests and screening for cancer for at least 20 years after your treatment.  In these cases, your health care provider may recommend more frequent screening and Pap  tests.  The HPV test is an additional test that may be used for cervical cancer screening. The HPV test looks for the virus that can cause the cell changes on the cervix. The cells collected during the Pap test can be tested for HPV. The HPV test could be used to screen women aged 58 years and older, and should be used in women of any age who have unclear Pap test results. After the age of 66, women should have HPV testing at the same frequency as a Pap test.  Colorectal cancer can be detected and often prevented. Most routine colorectal cancer screening begins at the age of 17 years and continues through age 22 years. However, your health care provider may recommend screening at an earlier age if you have risk factors for colon cancer. On a yearly basis, your health care provider may provide home test kits to check for hidden blood in the stool. Use of a small camera at the end of a tube, to directly examine the colon (sigmoidoscopy or colonoscopy), can detect the earliest forms of colorectal cancer. Talk to your health care provider about this at age 75, when routine screening begins. Direct exam of the colon should be repeated every 5-10 years through age 63 years, unless early forms of pre-cancerous polyps or small growths are found.  Hepatitis C blood testing is recommended for all people born from [birth year] through 1964 and any individual with known risks for hepatitis C. Osteoporosis is a disease in which the bones lose minerals and strength with aging. This can result in serious bone fractures or breaks. The risk of osteoporosis can be identified using a bone density scan. Women ages 42 years and over and women at risk for fractures or osteoporosis should discuss screening with their health care providers. Ask your health care provider whether you should take a calcium supplement or vitamin D to reduce the rate of osteoporosis.  Menopause can be associated with physical symptoms and risks.   Use  sunscreen. Apply sunscreen liberally and repeatedly throughout the day. You should seek shade when your shadow is shorter than you. Protect yourself by wearing long sleeves, pants, a wide-brimmed hat, and sunglasses year round, whenever you are outdoors.  Once a month, do a whole body skin exam, using a mirror to look at the skin on your back. Tell your health care provider of new moles, moles that have irregular borders, moles that are larger than a pencil eraser, or moles that have changed in shape or color.  Stay current with required vaccines (immunizations).  Influenza vaccine. All adults should be immunized every year.  Tetanus, diphtheria, and acellular pertussis (Td, Tdap) vaccine.  An adult who has not previously received Tdap or who does not know her vaccine status should receive 1 dose of Tdap. This initial dose should be followed by tetanus and diphtheria toxoids (Td) booster doses every 10 years. Adults with an unknown or incomplete history of completing a 3-dose immunization series with Td-containing vaccines should begin or complete a primary immunization series including a Tdap dose. Adults should receive a Td booster every 10 years.  Zoster vaccine. One dose is recommended for adults aged 34 years or older unless certain conditions are present.  Measles, mumps, and rubella (MMR) vaccine. Adults born before 33 generally are considered immune to measles and mumps.     Pneumococcal 13-valent conjugate (PCV13) vaccine. When indicated, a person who is uncertain of her immunization history and has no record of immunization should receive the PCV13 vaccine. An adult aged 26 years or older who has certain medical conditions and has not been previously immunized should receive 1 dose of PCV13 vaccine. This PCV13 should be followed with a dose of pneumococcal polysaccharide (PPSV23) vaccine. The PPSV23 vaccine dose should be obtained at least 8 weeks after the dose of PCV13 vaccine. An adult  aged 71 years or older who has certain medical conditions and previously received 1 or more doses of PPSV23 vaccine should receive 1 dose of PCV13. The PCV13 vaccine dose should be obtained 1 or more years after the last PPSV23 vaccine dose.    Pneumococcal polysaccharide (PPSV23) vaccine. When PCV13 is also indicated, PCV13 should be obtained first. All adults aged 68 years and older should be immunized. An adult younger than age 59 years who has certain medical conditions should be immunized. Any person who resides in a nursing home or long-term care facility should be immunized. An adult smoker should be immunized. People with an immunocompromised condition and certain other conditions should receive both PCV13 and PPSV23 vaccines. People with human immunodeficiency virus (HIV) infection should be immunized as soon as possible after diagnosis. Immunization during chemotherapy or radiation therapy should be avoided. Routine use of PPSV23 vaccine is not recommended for American Indians, [address], or people younger than 65 years unless there are medical conditions that require PPSV23 vaccine. When indicated, people who have unknown immunization and have no record of immunization should receive PPSV23 vaccine. One-time revaccination 5 years after the first dose of PPSV23 is recommended for people aged 19-64 years who have chronic kidney failure, nephrotic syndrome, asplenia, or immunocompromised conditions. People who received 1-2 doses of PPSV23 before age 50 years should receive another dose of PPSV23 vaccine at age 20 years or later if at least 5 years have passed since the previous dose. Doses of PPSV23 are not needed for people immunized with PPSV23 at or after age 93 years.  Ages 65 years and over  Blood pressure check.  Lipid and cholesterol check.  Lung cancer screening. / Every year if you are aged 55-80 years and have a 30-pack-year history of smoking and currently smoke or have quit  within the past 15 years. Yearly screening is stopped once you have quit smoking for at least 15 years or develop a health problem that would prevent you from having lung cancer treatment.  Clinical breast exam.** / Every year after age 32 years.  BRCA-related cancer risk assessment.** / For women who have family members with a BRCA-related cancer (breast, ovarian, tubal, or peritoneal cancers).  Mammogram.** / Every year beginning at age 32 years and continuing for as long as you  are in good health. Consult with your health care provider.  Pap test.** / Every 3 years starting at age 98 years through age 57 or 31 years with 3 consecutive normal Pap tests. Testing can be stopped between 65 and 70 years with 3 consecutive normal Pap tests and no abnormal Pap or HPV tests in the past 10 years.  HPV screening.** / Every 3 years from ages 65 years through ages 37 or 27 years with a history of 3 consecutive normal Pap tests. Testing can be stopped between 65 and 70 years with 3 consecutive normal Pap tests and no abnormal Pap or HPV tests in the past 10 years.  Fecal occult blood test (FOBT) of stool. / Every year continuing until age 44 years. You may not need to do this test if you get a colonoscopy every 10 years.  Flexible sigmoidoscopy or colonoscopy. Every 5 years for a flexible sigmoidoscopy or every 10 years for a colonoscopy beginning at age 44 years and continuing until age 64 years.  Hepatitis C blood test.** / For all people born from [birth year] through 1964 and any individual with known risks for hepatitis C.  Osteoporosis screening.** / A one-time screening for women ages 66 years and over and women at risk for fractures or osteoporosis.  Skin self-exam. / Monthly.  Influenza vaccine. / Every year.  Tetanus, diphtheria, and acellular pertussis (Tdap/Td) vaccine.** / 1 dose of Td every 10 years.  Varicella vaccine.** / Consult your health care provider.  Zoster vaccine.** / 1 dose for  adults aged 51 years or older.  Pneumococcal 13-valent conjugate (PCV13) vaccine.** / Consult your health care provider.  Pneumococcal polysaccharide (PPSV23) vaccine.** / 1 dose for all adults aged 61 years and older.

## 2014-09-06 NOTE — Progress Notes (Signed)
Patient ID: Alexandra Lindsey, female   DOB: May 16, 1941, 73 y.o.   MRN: [id number]  Annual Screening Comprehensive Examination  This very nice 73 y.o.MWF presents for complete physical.  Patient has been followed for HTN, Prediabetes, Hyperlipidemia, and Vitamin D Deficiency. Patient was diagnosed with Parkinson's Dz in 2002 and surprisingly has done very well and is currently follow by Dr [name].    patient has labile HTN monitored expectantly predates since 2007.  GFR 58 ml/min in Oct 2014 consistent w/Stage CKD.Patient's BP has been controlled at home and patient denies any cardiac symptoms as chest pain, palpitations, shortness of breath, dizziness or ankle swelling. Today's BP was  106/70 mmHg.   Patient's hyperlipidemia is not controlled with diet. Patient denies myalgias or other medication SE's. Last lipids were TC  206, HDL 53, TG 97 and LDL 134.   Patient has prediabetes predating with A1c 6.3% in 2011 and patient denies reactive hypoglycemic symptoms, visual blurring, diabetic polys, or paresthesias. Last A1c was 5.9% in Oct 2014.   Finally, patient has history of Vitamin D Deficiency with vit D level 28 in 2008  and 53 in Oct 2014.    Medication Sig  . carbidopa-levodopa  IR)25-100 MG  Take 1 tablet by mouth every morning. Chews up at 5:30 am every morning  . carbidopa-levodopa IR 25-100 MG  Take 1 tablet by mouth at 5:30am/6am/12pm/6pm and 1/2 tablet by mouth at 9am/3pm  . VITAMIN D 1000 UNITS tablet Take 5,000 Units by mouth daily.  [name] HCTZ 25 MG tablet Take 25 mg by mouth daily as needed (fluid retention).   . Magnesium Oxide 500 MG TABS Take 1 tablet by mouth daily.  . Melatonin 5 MG TABS Take 1 tablet by mouth at bedtime as needed and may repeat dose one time if needed.  . pramipexole (MIRAPEX) 1 MG tablet TAKE 1 TABLET BY MOUTH 3 TIMES DAILY  . ranitidine (ZANTAC) 150 MG tablet Take 150 mg by mouth daily as needed. For acid reflex   Allergies  Allergen Reactions  . Macrobid  [Nitrofurantoin Macrocrystal] Swelling and Rash  . Cephalosporins Other (See Comments)    Cannot sleep  . Codeine Nausea Only    dizziness  . Darvocet [Propoxyphene N-Acetaminophen] Nausea And Vomiting  . Levaquin [Levofloxacin] Hives  . Penicillins Rash  . Selegiline Hcl Rash  . Tramadol Nausea And Vomiting  . Triamcinolone Itching   Past Medical History  Diagnosis Date  . Parkinson's disease 10-07-12  . 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 Never smoked tobacco   . Spinal stenosis   . Costochondritis   . Hyperlipidemia 10-07-12  . Hypertension   . GERD (gastroesophageal reflux disease)   . Prediabetes   . Parkinson's disease    Health Maintenance  Topic Date Due  . Colonoscopy  12/03/2016   Immunization History  Administered Date(s) Administered  . Influenza, High Dose Seasonal PF 09/08/2014  . Pneumococcal-Unspecified 11/27/2006  . Td 09/08/2013  . Zoster 06/27/2006   Past Surgical History  Procedure Laterality Date  . Abdominal hysterectomy    . Hernia repair    . Rotator cuff repair Left 10-07-12  . Bunionectomy Bilateral 11-11-3  . Cardiac catheterization Bilateral 2013   History  Substance Use Topics  . Smoking status: Never Smoker   . Smokeless tobacco: Not on file  . Alcohol Use: No    ROS Constitutional: Denies fever, chills, weight loss/gain, headaches, insomnia, fatigue, night sweats, and change in appetite. Eyes: Denies redness, blurred vision,  diplopia, discharge, itchy, watery eyes.  ENT: Denies discharge, congestion, post nasal drip, epistaxis, sore throat, earache, hearing loss, dental pain, Tinnitus, Vertigo, Sinus pain, snoring.  Cardio: Denies chest pain, palpitations, irregular heartbeat, syncope, dyspnea, diaphoresis, orthopnea, PND, claudication, edema Respiratory: denies cough, dyspnea, DOE, pleurisy, hoarseness, laryngitis, wheezing.  Gastrointestinal: Denies dysphagia, heartburn, reflux, water brash, pain, cramps, nausea,  vomiting, bloating, diarrhea, constipation, hematemesis, melena, hematochezia, jaundice, hemorrhoids Genitourinary: Denies dysuria, frequency, urgency, nocturia, hesitancy, discharge, hematuria, flank pain Breast: Breast lumps, nipple discharge, bleeding.  Musculoskeletal: Denies arthralgia, myalgia, stiffness, Jt. Swelling, pain, limp, and strain/sprain. Denies falls. Skin: Denies puritis, rash, hives, warts, acne, eczema, changing in skin lesion Neuro: No weakness, tremor, incoordination, spasms, paresthesia, pain Psychiatric: Denies confusion, memory loss, sensory loss. Denies Depression. Endocrine: Denies change in weight, skin, hair change, nocturia, and paresthesia, diabetic polys, visual blurring, hyper / hypo glycemic episodes.  Heme/Lymph: No excessive bleeding, bruising, enlarged lymph nodes.  Physical Exam  BP 106/70  Pulse 72  Temp(Src) 97.5 F (36.4 C) (Temporal)  Resp 16  Ht 5' 0.5" (1.537 m)  Wt 136 lb 9.6 oz (61.961 kg)  BMI 26.23 kg/m2  General Appearance: Well nourished and in no apparent distress. Eyes: PERRLA, EOMs, conjunctiva no swelling or erythema, normal fundi and vessels. Sinuses: No frontal/maxillary tenderness ENT/Mouth: EACs patent / TMs  nl. Nares clear without erythema, swelling, mucoid exudates. Oral hygiene is good. No erythema, swelling, or exudate. Tongue normal, non-obstructing. Tonsils not swollen or erythematous. Hearing normal.  Neck: Supple, thyroid normal. No bruits, nodes or JVD. Respiratory: Respiratory effort normal.  BS equal and clear bilateral without rales, rhonci, wheezing or stridor. Cardio: Heart sounds are normal with regular rate and rhythm and no murmurs, rubs or gallops. Peripheral pulses are normal and equal bilaterally without edema. No aortic or femoral bruits. Chest: symmetric with normal excursions and percussion. Breasts: Symmetric, without lumps, nipple discharge, retractions, or fibrocystic changes.  Abdomen: Flat, soft,  with bowl sounds. Nontender, no guarding, rebound, hernias, masses, or organomegaly.  Lymphatics: Non tender without lymphadenopathy.   Musculoskeletal: Full ROM all peripheral extremities, joint stability, 5/5 strength, and normal gait. No cog wheeling. Skin: Warm and dry without rashes, lesions, cyanosis, clubbing or  ecchymosis.  Neuro: Cranial nerves intact, reflexes equal bilaterally. Normal muscle tone, no cerebellar symptoms. Sensation intact. No Tremor or obvious stigmata of Parkinson's Dz. Pysch: Awake and oriented X 3, normal affect, Insight and Judgment appropriate.  Assessment and Plan  1. Annual Screening Examination 2. Hypertension  3. Hyperlipidemia 4. Pre Diabetes 5. Vitamin D Deficiency 6. Parkinson's Dz   Continue prudent diet as discussed, weight control, BP monitoring, regular exercise, and medications. Discussed med's effects and SE's. Screening labs and tests as requested with regular follow-up as recommended.

## 2014-09-08 ENCOUNTER — Ambulatory Visit (INDEPENDENT_AMBULATORY_CARE_PROVIDER_SITE_OTHER): Payer: Medicare Other | Admitting: Internal Medicine

## 2014-09-08 ENCOUNTER — Encounter: Payer: Self-pay | Admitting: Physician Assistant

## 2014-09-08 ENCOUNTER — Encounter: Payer: Self-pay | Admitting: Internal Medicine

## 2014-09-08 VITALS — BP 106/70 | HR 72 | Temp 97.5°F | Resp 16 | Ht 60.5 in | Wt 136.6 lb

## 2014-09-08 DIAGNOSIS — Z9181 History of falling: Secondary | ICD-10-CM

## 2014-09-08 DIAGNOSIS — Z789 Other specified health status: Secondary | ICD-10-CM | POA: Diagnosis not present

## 2014-09-08 DIAGNOSIS — E559 Vitamin D deficiency, unspecified: Secondary | ICD-10-CM

## 2014-09-08 DIAGNOSIS — Z1389 Encounter for screening for other disorder: Secondary | ICD-10-CM | POA: Diagnosis not present

## 2014-09-08 DIAGNOSIS — I10 Essential (primary) hypertension: Secondary | ICD-10-CM

## 2014-09-08 DIAGNOSIS — R7309 Other abnormal glucose: Secondary | ICD-10-CM | POA: Diagnosis not present

## 2014-09-08 DIAGNOSIS — Z23 Encounter for immunization: Secondary | ICD-10-CM

## 2014-09-08 DIAGNOSIS — E785 Hyperlipidemia, unspecified: Secondary | ICD-10-CM

## 2014-09-08 DIAGNOSIS — M5136 Other intervertebral disc degeneration, lumbar region: Secondary | ICD-10-CM | POA: Diagnosis not present

## 2014-09-08 DIAGNOSIS — Z79899 Other long term (current) drug therapy: Secondary | ICD-10-CM | POA: Diagnosis not present

## 2014-09-08 DIAGNOSIS — Z1212 Encounter for screening for malignant neoplasm of rectum: Secondary | ICD-10-CM

## 2014-09-08 DIAGNOSIS — R7303 Prediabetes: Secondary | ICD-10-CM

## 2014-09-08 DIAGNOSIS — Z1331 Encounter for screening for depression: Secondary | ICD-10-CM

## 2014-09-08 LAB — CBC WITH DIFFERENTIAL/PLATELET
Basophils Absolute: 0 10*3/uL (ref 0.0–0.1)
Basophils Relative: 0 % (ref 0–1)
Eosinophils Absolute: 0.1 10*3/uL (ref 0.0–0.7)
Eosinophils Relative: 2 % (ref 0–5)
HEMATOCRIT: 36.9 % (ref 36.0–46.0)
HEMOGLOBIN: 12.3 g/dL (ref 12.0–15.0)
LYMPHS ABS: 1.8 10*3/uL (ref 0.7–4.0)
Lymphocytes Relative: 38 % (ref 12–46)
MCH: 30.7 pg (ref 26.0–34.0)
MCHC: 33.3 g/dL (ref 30.0–36.0)
MCV: 92 fL (ref 78.0–100.0)
MONO ABS: 0.4 10*3/uL (ref 0.1–1.0)
MONOS PCT: 8 % (ref 3–12)
NEUTROS PCT: 52 % (ref 43–77)
Neutro Abs: 2.4 10*3/uL (ref 1.7–7.7)
Platelets: 275 10*3/uL (ref 150–400)
RBC: 4.01 MIL/uL (ref 3.87–5.11)
RDW: 13.2 % (ref 11.5–15.5)
WBC: 4.7 10*3/uL (ref 4.0–10.5)

## 2014-09-08 LAB — HEMOGLOBIN A1C
HEMOGLOBIN A1C: 5.8 % — AB (ref <5.7)
Mean Plasma Glucose: 120 mg/dL — ABNORMAL HIGH (ref <117)

## 2014-09-09 LAB — MICROALBUMIN / CREATININE URINE RATIO
Creatinine, Urine: 265.6 mg/dL
Microalb Creat Ratio: 5.3 mg/g (ref 0.0–30.0)
Microalb, Ur: 1.4 mg/dL (ref <2.0)

## 2014-09-09 LAB — TSH: TSH: 1.908 u[IU]/mL (ref 0.350–4.500)

## 2014-09-09 LAB — BASIC METABOLIC PANEL WITH GFR
BUN: 30 mg/dL — AB (ref 6–23)
CO2: 25 meq/L (ref 19–32)
Calcium: 9.4 mg/dL (ref 8.4–10.5)
Chloride: 107 mEq/L (ref 96–112)
Creat: 1.05 mg/dL (ref 0.50–1.10)
GFR, Est African American: 61 mL/min
GFR, Est Non African American: 53 mL/min — ABNORMAL LOW
GLUCOSE: 95 mg/dL (ref 70–99)
POTASSIUM: 4.3 meq/L (ref 3.5–5.3)
SODIUM: 140 meq/L (ref 135–145)

## 2014-09-09 LAB — HEPATIC FUNCTION PANEL
ALK PHOS: 62 U/L (ref 39–117)
AST: 16 U/L (ref 0–37)
Albumin: 3.8 g/dL (ref 3.5–5.2)
BILIRUBIN DIRECT: 0.1 mg/dL (ref 0.0–0.3)
BILIRUBIN INDIRECT: 0.5 mg/dL (ref 0.2–1.2)
TOTAL PROTEIN: 5.9 g/dL — AB (ref 6.0–8.3)
Total Bilirubin: 0.6 mg/dL (ref 0.2–1.2)

## 2014-09-09 LAB — LIPID PANEL
CHOL/HDL RATIO: 3.4 ratio
Cholesterol: 191 mg/dL (ref 0–200)
HDL: 57 mg/dL (ref >39)
LDL CALC: 120 mg/dL — AB (ref 0–99)
Triglycerides: 68 mg/dL (ref <150)
VLDL: 14 mg/dL (ref 0–40)

## 2014-09-09 LAB — URINALYSIS, MICROSCOPIC ONLY
Bacteria, UA: NONE SEEN
Casts: NONE SEEN
SQUAMOUS EPITHELIAL / LPF: NONE SEEN

## 2014-09-09 LAB — MAGNESIUM: Magnesium: 2.1 mg/dL (ref 1.5–2.5)

## 2014-09-09 LAB — INSULIN, FASTING: Insulin fasting, serum: 8.7 u[IU]/mL (ref 2.0–19.6)

## 2014-09-09 LAB — VITAMIN D 25 HYDROXY (VIT D DEFICIENCY, FRACTURES): Vit D, 25-Hydroxy: 82 ng/mL (ref 30–89)

## 2014-09-11 DIAGNOSIS — M5442 Lumbago with sciatica, left side: Secondary | ICD-10-CM | POA: Diagnosis not present

## 2014-09-19 ENCOUNTER — Other Ambulatory Visit: Payer: Self-pay | Admitting: Neurology

## 2014-09-21 ENCOUNTER — Other Ambulatory Visit (INDEPENDENT_AMBULATORY_CARE_PROVIDER_SITE_OTHER): Payer: Medicare Other | Admitting: *Deleted

## 2014-09-21 DIAGNOSIS — Z1212 Encounter for screening for malignant neoplasm of rectum: Secondary | ICD-10-CM | POA: Diagnosis not present

## 2014-09-21 LAB — POC HEMOCCULT BLD/STL (HOME/3-CARD/SCREEN)
FECAL OCCULT BLD: NEGATIVE
FECAL OCCULT BLD: NEGATIVE
FECAL OCCULT BLD: NEGATIVE

## 2014-10-06 ENCOUNTER — Encounter: Payer: Self-pay | Admitting: Neurology

## 2014-10-06 ENCOUNTER — Ambulatory Visit (INDEPENDENT_AMBULATORY_CARE_PROVIDER_SITE_OTHER): Payer: Medicare Other | Admitting: Neurology

## 2014-10-06 VITALS — BP 98/64 | HR 64 | Ht 60.0 in | Wt 135.0 lb

## 2014-10-06 DIAGNOSIS — M25552 Pain in left hip: Secondary | ICD-10-CM

## 2014-10-06 DIAGNOSIS — M4806 Spinal stenosis, lumbar region: Secondary | ICD-10-CM | POA: Diagnosis not present

## 2014-10-06 DIAGNOSIS — G249 Dystonia, unspecified: Secondary | ICD-10-CM | POA: Diagnosis not present

## 2014-10-06 DIAGNOSIS — G4752 REM sleep behavior disorder: Secondary | ICD-10-CM

## 2014-10-06 DIAGNOSIS — M48061 Spinal stenosis, lumbar region without neurogenic claudication: Secondary | ICD-10-CM

## 2014-10-06 DIAGNOSIS — G20 Parkinson's disease: Secondary | ICD-10-CM | POA: Diagnosis not present

## 2014-10-06 DIAGNOSIS — M545 Low back pain: Secondary | ICD-10-CM

## 2014-10-06 NOTE — Patient Instructions (Signed)
1. We have scheduled you at [facility] for your MRI on 10/23/14 at 12:00 pm. Please arrive 15 minutes prior and go to 1st floor radiology. If you need to reschedule for any reason please call [phone]. 2. Hold Levodopa on the day of your next appt.

## 2014-10-06 NOTE — [facility] Notes (Signed)
Alexandra Lindsey was seen today in the movement disorders clinic for neurologic consultation at the request of MCKEOWN,WILLIAM DAVID, MD.  The patient has previously seen both Dr. Erling Cruz and Dr. Rexene Alberts.  The records that were available to me were reviewed.  The consultation is for the evaluation and treatment of Parkinson's disease.  The patient has had a diagnosis of Parkinson's disease since at least 2002.  The first symptom was inability to get out of a chair and dragging the L leg and shuffling.  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    The patient was placed on Mirapex in 2002 and remains on that medication.  Once she was started on that medication she could immediately clog again.  She is currently on 1 mg 3 times a day.  She was placed on Stalevo 3 or 4 years later and is currently on Stalevo 100 mg, one tablet at 6 AM, half a tablet at 9 AM, 1 tablet at noon, half a tablet at 3 PM and one tablet at 6 PM.  In addition, she takes a regular carbidopa/levodopa 25/100 in the AM and chews it.  If she doesn't do that, she shuffles.  She chews that pill and then 45 mins later starts the Stalevo regimen.  She does state that Stalevo has become quite expensive for her and her insurance is going to change and asks about alternatives.    10/06/14 update:  The patient presents today, accompanied by her husband to supplement the history.  Last visit, I discontinued the patient's Stalevo, primarily because of cost and changed her to cupboard up a/levodopa 25/100, and we decided to hold the entacapone component.  She chews one tablet in the morning followed by carbidopa/levodopa 25/100 one tablet at 6 AM, half a tablet at 9 AM, 1 tablet at noon, half a tablet at 3 PM and one tablet at 6 PM.  She admits that she did not even take her 3 PM dose today, and is not sure that she even needs it.  Overall, from a Parkinson standpoint she feels great.  She admits to  some dyskinesia in the lower legs, but states that that is not bothersome.  Her biggest issue continues to be low back pain and left hip pain.  She has been back to Dr. Neomia Dear but did not get much relief.  She subsequently saw Dr. Lorin Mercy at [facility].  She was started on 100 mg of Neurontin at bedtime, but that only helped her sleep.  She states that first thing in the morning she has such pain in the left hip and back that she can barely walk.  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 Macrobid [Nitrofurantoin Macrocrystal] Swelling and Rash  . Cephalosporins Other (See Comments)    Cannot sleep  . Codeine Nausea Only    dizziness  . Darvocet [Propoxyphene N-Acetaminophen] Nausea And Vomiting  . Levaquin [Levofloxacin] Hives  . Penicillins Rash  . Selegiline Hcl Rash  . Tramadol Nausea And Vomiting  . Triamcinolone Itching    CURRENT MEDICATIONS:  Outpatient Encounter Prescriptions as of 10/06/2014  Medication Sig  . carbidopa-levodopa (SINEMET IR) 25-100 MG per tablet Take 1 tablet by mouth at 5:30am/6am/12pm/6pm and 1/2 tablet by  mouth at 9am/3pm  . cholecalciferol (VITAMIN D) 1000 UNITS tablet Take 5,000 Units by mouth daily.  [name] gabapentin (NEURONTIN) 100 MG capsule Take 100 mg by mouth at bedtime.   . hydrochlorothiazide (HYDRODIURIL) 25 MG tablet Take 25 mg by mouth daily as needed (fluid retention).   . Magnesium Oxide 500 MG TABS Take 1 tablet by mouth daily.  . pramipexole (MIRAPEX) 1 MG tablet TAKE 1 TABLET BY MOUTH 3 TIMES DAILY  . ranitidine (ZANTAC) 150 MG tablet Take 150 mg by mouth daily as needed. For acid reflex  . [DISCONTINUED] carbidopa-levodopa (SINEMET IR) 25-100 MG per tablet Take 1 tablet by mouth every morning. Chews up at 5:30 am every morning  . [DISCONTINUED] Melatonin 5 MG  TABS Take 1 tablet by mouth at bedtime as needed and may repeat dose one time if needed.    PAST MEDICAL HISTORY:   Past Medical History  Diagnosis Date  . Parkinson's disease 10-07-12  . 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 Never smoked tobacco   . Spinal stenosis   . Costochondritis   . Hyperlipidemia 10-07-12  . Hypertension   . GERD (gastroesophageal reflux disease)   . Prediabetes   . Parkinson's disease     PAST SURGICAL HISTORY:   Past Surgical History  Procedure Laterality Date  . Abdominal hysterectomy    . Hernia repair    . Rotator cuff repair Left 10-07-12  . Bunionectomy Bilateral 11-11-3  . Cardiac catheterization Bilateral 2013    SOCIAL HISTORY:   History   Social History  . Marital Status: Married    Spouse Name: N/A    Number of Children: N/A  . Years of Education: N/A   Occupational History  . retired     Chiropractor   Social History Main Topics  . Smoking status: Never Smoker   . Smokeless tobacco: Not on file  . Alcohol Use: No  . Drug Use: No  . Sexual Activity: Not on file   Other Topics Concern  . Not on file   Social History Narrative    FAMILY HISTORY:   Family Status  Relation Status Death Age  . Mother Deceased     esophageal cancer, heart attack  . Father Deceased     suicide  . Brother Deceased     suicide  . Brother Alive     HTN, hypercholesterolemia  . Brother Alive     mental retardation    ROS:      A complete 10 system review of systems was obtained and was unremarkable apart from what is mentioned above.  PHYSICAL EXAMINATION:    VITALS:   Filed Vitals:   10/06/14 1441  BP: 98/64  Pulse: 64  Height: 5' (1.524 m)  Weight: 135 lb (61.236 kg)    GEN:  The patient appears stated age and is in NAD. HEENT:  Normocephalic, atraumatic.  The mucous membranes are moist. The superficial temporal arteries are without ropiness or tenderness. CV:  RRR Lungs:  CTAB Neck/HEME:  There are no carotid  bruits bilaterally.  Neurological examination:  Orientation: The patient is alert and oriented x3. Fund of knowledge is appropriate.  Recent and remote memory are intact.  Attention and concentration are normal.    Able to name objects and repeat phrases. Cranial nerves: There is good facial symmetry.  No significant facial hypomimia.  Pupils are equal round and reactive to light bilaterally. Fundoscopic exam reveals clear margins bilaterally. Extraocular muscles are intact.  There are no square wave jerks.  The visual fields are full to confrontational testing. The speech is fluent and clear. Soft palate rises symmetrically and there is no tongue deviation. Hearing is intact to conversational tone. Sensation: Sensation is intact to light touch throughout. Motor: Strength is 5/5 in the bilateral upper and lower extremities.   Shoulder shrug is equal and symmetric.  There is no pronator drift.  Movement examination: Tone: There is normal tone in the bilateral upper extremities.  The tone in the lower extremities is normal.  Abnormal movements: no dyskinesia noted today. Coordination:  There is no significant decremation with RAM's, either in the hands or feet bilaterally Gait and Station: The patient has no significant difficulty arising out of a deep-seated chair without the use of the hands. The patient's stride length is normal. The patient has a negative pull test.      ASSESSMENT/PLAN:  1.  Idiopathic Parkinson's disease, by history.  -The patient actually does not look parkinsonian at all today, and this is despite the fact that I discontinued her Comtan and despite the fact that she missed her 3 PM dose of levodopa.  I am actually going to have her hold her morning dosages of levodopa next visit and will not see her until the afternoon next time.until then, she will continue her carbidopa/levodopa 25/100 one tablet chewed up in the morning followed by carbidopa/levodopa 25/100 one tablet at 6  AM, half a tablet at 9 AM, 1 tablet at noon, half a tablet at 3 PM and one tablet at 6 PM.   -We talked about the value of safe, cardiovascular exercise.  Greater than 50% of the 60 minute visit spent in counseling. 2.  chronic low back pain and left hip pain  -I sort of think that this is more piriformis syndrome, but I am going to reimage her with an MRI of the L spine and L hip.  Talked about potentially sending her to [facility] but will wait to see what results show. 3.  REM behavior disorder.  -  She did not want to take the clonazepam, primarily because of its addictive properties.  She did try it for a short period of time.  She tried Seroquel as well, but it caused diplopia.  She does not want to try anything further right now. 4.  followup in the next few months, sooner should new neurologic issues arise.

## 2014-10-12 ENCOUNTER — Telehealth: Payer: Self-pay | Admitting: Neurology

## 2014-10-12 NOTE — Telephone Encounter (Signed)
Received request to refill Mirapex 1 mg tablets - 1 tablet TID from [facility]. It was not clear to me whether patient was on this medication. Please advise if okay to refill?

## 2014-10-12 NOTE — Telephone Encounter (Signed)
yes

## 2014-10-13 MED ORDER — PRAMIPEXOLE DIHYDROCHLORIDE 1 MG PO TABS: 1.0000 mg | ORAL_TABLET | Freq: Three times a day (TID) | ORAL | Status: DC

## 2014-10-13 NOTE — Telephone Encounter (Signed)
Refill sent to patients pharmacy. 

## 2014-10-23 ENCOUNTER — Ambulatory Visit (HOSPITAL_COMMUNITY)
Admission: RE | Admit: 2014-10-23 | Discharge: 2014-10-23 | Disposition: A | Discharge status: Home / Self Care | Payer: Medicare Other | Source: Ambulatory Visit | Attending: Neurology | Admitting: Neurology

## 2014-10-23 DIAGNOSIS — M545 Low back pain: Secondary | ICD-10-CM

## 2014-10-23 DIAGNOSIS — M25552 Pain in left hip: Secondary | ICD-10-CM | POA: Diagnosis not present

## 2014-10-23 DIAGNOSIS — M4806 Spinal stenosis, lumbar region: Secondary | ICD-10-CM | POA: Insufficient documentation

## 2014-10-23 DIAGNOSIS — M5126 Other intervertebral disc displacement, lumbar region: Secondary | ICD-10-CM | POA: Diagnosis not present

## 2014-10-23 DIAGNOSIS — M48061 Spinal stenosis, lumbar region without neurogenic claudication: Secondary | ICD-10-CM

## 2014-10-23 DIAGNOSIS — R609 Edema, unspecified: Secondary | ICD-10-CM | POA: Diagnosis not present

## 2014-10-26 ENCOUNTER — Telehealth: Payer: Self-pay | Admitting: Neurology

## 2014-10-26 DIAGNOSIS — M5441 Lumbago with sciatica, right side: Secondary | ICD-10-CM

## 2014-10-26 DIAGNOSIS — M25552 Pain in left hip: Secondary | ICD-10-CM

## 2014-10-26 DIAGNOSIS — M5442 Lumbago with sciatica, left side: Secondary | ICD-10-CM

## 2014-10-26 NOTE — Telephone Encounter (Signed)
Have you reviewed MR results?

## 2014-10-26 NOTE — Telephone Encounter (Signed)
Spoke with patient. Referral made to Dr [facility] for appt on 11/05/2014 at 9:15 am. Patient made aware.

## 2014-10-26 NOTE — Telephone Encounter (Signed)
MRI lumbar spine:  degenerative changes causing moderate impingement at L4-5 and mild impingement at L5-S1. Borderline impingement at L3-4.  MRI of the hip showed swelling the quadratus femoris muscle due to an impingement of one of the muscles, overall mild.  She has been to a lot of doctors, and I think that I would either recommend that she go to Dr. Tamala Julian and see if he can help, and if not, we can try ortho.

## 2014-10-26 NOTE — Telephone Encounter (Signed)
Pt called following up on her MRI results she had done on Friday 10/23/14. C/B [id number]

## 2014-10-29 DIAGNOSIS — Z01419 Encounter for gynecological examination (general) (routine) without abnormal findings: Secondary | ICD-10-CM | POA: Diagnosis not present

## 2014-10-29 DIAGNOSIS — Z1231 Encounter for screening mammogram for malignant neoplasm of breast: Secondary | ICD-10-CM | POA: Diagnosis not present

## 2014-11-05 ENCOUNTER — Encounter: Payer: Self-pay | Admitting: Family Medicine

## 2014-11-05 ENCOUNTER — Ambulatory Visit (INDEPENDENT_AMBULATORY_CARE_PROVIDER_SITE_OTHER): Payer: Medicare Other | Admitting: Family Medicine

## 2014-11-05 VITALS — BP 94/64 | HR 76 | Ht 60.0 in | Wt 136.0 lb

## 2014-11-05 DIAGNOSIS — M533 Sacrococcygeal disorders, not elsewhere classified: Secondary | ICD-10-CM | POA: Diagnosis not present

## 2014-11-05 DIAGNOSIS — G5702 Lesion of sciatic nerve, left lower limb: Secondary | ICD-10-CM

## 2014-11-05 NOTE — Assessment & Plan Note (Signed)
Patient is already being treated for lumbar radiculopathy and has had epidural steroid injections without any significant improvement. Patient is noticing some minimal improvement with the gabapentin and we may want to titrate up at a different time. 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  Piriformis Syndrome  Using an anatomical model, reviewed with the patient the structures involved and how they related to diagnosis. The patient indicated understanding.   The patient was given a handout from Dr. Arne Cleveland book "The Sports Medicine Patient Advisor" describing the anatomy and rehabilitation of the following condition: Piriformis Syndrome  Also given a handout with more extensive Piriformis stretching, hip flexor and abductor strengthening, ham stretching  Rec deep massage, explained self-massage with ball Patient will come back in 3-4 weeks for further evaluation and treatment.

## 2014-11-05 NOTE — Patient Instructions (Addendum)
Very nice to meet you Exercises 3 times a week.   Vitamin D 2000 IU daily.  Consider turmeric 500mg  twice daily.  Fish oil 2 grams daily can decrease inflammation B12 1028mcg daily Heat before activity and ice after activity for 10 minutes Tennisball to back left pocket with sitting for a long time.  Try pennsaid twice daily as needed.  See me again in 3 weeks and if not better we will focus on the SI joint more.

## 2014-11-05 NOTE — Assessment & Plan Note (Signed)
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 We discussed icing protocol as well as home exercises. Patient will be treated for more the piriformis syndrome. Patient does not make any significant improvement we may would consider a diagnostic as well as potential therapeutic injection into the left SI joint to see if this is contributing. Otherwise the discussed the benefits a proper shoes and over-the-counter medications that could be beneficial. Patient was given a trial topical anti-inflammatory. Patient will come back and see me again in 3-4 weeks.

## 2014-11-05 NOTE — [facility] Notes (Signed)
[facility] [address] [place], [place] [address] Phone: [phone] Subjective:    I'm seeing this patient by the request  of:  Dr. Carles Collet  CC: Back pain and hip pain  [id number] Alexandra Lindsey is a 73 y.o. female coming in with complaint of back pain and hip pain. Patient has been followed by neurology for Parkinson's disease. Patient does have a known history of spinal stenosis. Patient did have a further workup recently with MRI of the lumbar spine and hip. These were reviewed by me today. Patient's MRI of the lumbar spine on 10/23/2014 showed spondylosis with degenerative disc disease with moderate impingement at L4-L5 and mild impingement at L5-S1. Patient's MRI of the hip did show some nonspecific edema of the quadratus femoris muscle. When I'm reviewing this this is considered likely early sarcopenia. No sign of infection.  Patient for her back has had epidural steroid injections in December 2014 as well as April 2015. Patient states the epidural steroid injection did not seem to be beneficial. And seems to be mostly now located into her left buttocks. No significant radiation down the leg. Worse after sitting a long amount of time specially on soft surfaces. Patient states when she gets up in the morning she has to take a hot shower to loosen up. Patient denies any numbness or weakness of the lower extremity. Patient states that it does not stop her from activities but due to the pain she has to augment some of her daily activities. Patient has had multiple falls recently. Patient has seen another provider for this and was given gabapentin in case this is secondary to the lumbar radiculopathy. Patient rates the severity of 6 out of 10 and denies any nighttime awakening.      Past medical history, social, surgical and family history all reviewed in electronic medical record.   Review of Systems: No headache, visual changes, nausea, vomiting, diarrhea,  constipation, dizziness, abdominal pain, skin rash, fevers, chills, night sweats, weight loss, swollen lymph nodes, body aches, joint swelling, muscle aches, chest pain, shortness of breath, mood changes.   Objective Blood pressure 94/64, pulse 76, height 5' (1.524 m), weight 136 lb (61.689 kg), SpO2 95 %.  General: No apparent distress alert and oriented x3 mood and affect normal, dressed appropriately.  HEENT: Pupils equal, extraocular movements intact  Respiratory: Patient's speak in full sentences and does not appear short of breath  Cardiovascular: No lower extremity edema, non tender, no erythema  Skin: Warm dry intact with no signs of infection or rash on extremities or on axial skeleton.  Abdomen: Soft nontender  Neuro: Cranial nerves II through XII are intact, neurovascularly intact in all extremities with 2+ DTRs and 2+ pulses.  Lymph: No lymphadenopathy of posterior or anterior cervical chain or axillae bilaterally.  Gait normal with good balance and coordination.  MSK:  Non tender with full range of motion and good stability and symmetric strength and tone of shoulders, elbows, wrist, hip, knee and ankles bilaterally.  Back Exam:  Inspection: Mild increase in kyphosis Motion: Flexion 35 deg, Extension 25 deg, Side Bending to 25 deg bilaterally,  Rotation to 25 deg bilaterally  SLR laying: Negative  XSLR laying: Negative  Palpable tenderness: Moderate tenderness in the paraspinal musculature as well as left SI joint and left piriformis muscle FABER: Mildly positive left side Sensory change: Gross sensation intact to all lumbar and sacral dermatomes.  Reflexes: 2+ at both patellar tendons, 2+ at achilles  tendons, Babinski's downgoing.  Strength at foot  Plantar-flexion: 5/5 Dorsi-flexion: 5/5 Eversion: 5/5 Inversion: 5/5  Leg strength  Quad: 5/5 Hamstring: 5/5 Hip flexor: 4/5 Hip abductors: 4/5  Gait unremarkable. Hip: ROM IR: 15 Deg, ER: 35 Deg, Flexion: 100 Deg, Extension:  100 Deg, Abduction: 45 Deg, Adduction: 45 Deg Strength IR: 4/5, ER: 5/5, Flexion: 5/5, Extension: 5/5, Abduction: 5/5, Adduction: 5/5 Pelvic alignment unremarkable to inspection and palpation. Standing hip rotation and gait without trendelenburg sign / unsteadiness. Greater trochanter without tenderness to palpation. Once again tender over the left SI joint and the left piriformis muscle.   Impression and Recommendations:     This case required medical decision making of moderate complexity.

## 2014-11-26 ENCOUNTER — Encounter: Payer: Self-pay | Admitting: Family Medicine

## 2014-11-26 ENCOUNTER — Ambulatory Visit (INDEPENDENT_AMBULATORY_CARE_PROVIDER_SITE_OTHER): Payer: Medicare Other | Admitting: Family Medicine

## 2014-11-26 VITALS — BP 116/70 | HR 69 | Temp 97.4°F | Ht 60.0 in | Wt 137.0 lb

## 2014-11-26 DIAGNOSIS — M533 Sacrococcygeal disorders, not elsewhere classified: Secondary | ICD-10-CM | POA: Diagnosis not present

## 2014-11-26 NOTE — [facility] Notes (Signed)
[facility] [address] [place], [place] [address] Phone: [phone] Subjective:     CC: Back pain and hip pain follow up  [id number] Alexandra Lindsey is a 73 y.o. female coming in with complaint of back pain and hip pain. Patient has been followed by neurology for Parkinson's disease. Patient does have a known history of spinal stenosis. Patient did have a further workup recently with MRI of the lumbar spine and hip. These were reviewed by me today. Patient's MRI of the lumbar spine on 10/23/2014 showed spondylosis with degenerative disc disease with moderate impingement at L4-L5 and mild impingement at L5-S1.    Patient for her back has had epidural steroid injections in December 2014 as well as April 2015. Patient states the epidural steroid injection did not seem to be beneficial.   Patient was seen and was treated more for a sacroiliac joint dysfunction and arthritis. Patient has been doing the home exercises and states that this has improved somewhat. Patient states that she is a proximal only 30% better. Still having pain that seems to be localized mostly on the left side. States that certain activities seem to be painful. Patient states that it can stop her from some activity and when she rolls over at night can wake her up. Patient has tried the over-the-counter medications and states that no side effects and seems to be helping minimally. She has been able to increase her daily activities but still having discomfort.    Past medical history, social, surgical and family history all reviewed in electronic medical record.   Review of Systems: No headache, visual changes, nausea, vomiting, diarrhea, constipation, dizziness, abdominal pain, skin rash, fevers, chills, night sweats, weight loss, swollen lymph nodes, body aches, joint swelling, muscle aches, chest pain, shortness of breath, mood changes.   Objective Blood pressure 116/70, pulse 69,  temperature 97.4 F (36.3 C), temperature source Oral, height 5' (1.524 m), weight 137 lb (62.143 kg), SpO2 97 %.  General: No apparent distress alert and oriented x3 mood and affect normal, dressed appropriately.  HEENT: Pupils equal, extraocular movements intact  Respiratory: Patient's speak in full sentences and does not appear short of breath  Cardiovascular: No lower extremity edema, non tender, no erythema  Skin: Warm dry intact with no signs of infection or rash on extremities or on axial skeleton.  Abdomen: Soft nontender  Neuro: Cranial nerves II through XII are intact, neurovascularly intact in all extremities with 2+ DTRs and 2+ pulses.  Lymph: No lymphadenopathy of posterior or anterior cervical chain or axillae bilaterally.  Gait normal with good balance and coordination.  MSK:  Non tender with full range of motion and good stability and symmetric strength and tone of shoulders, elbows, wrist, hip, knee and ankles bilaterally.  Back Exam:  Inspection: Mild increase in kyphosis Motion: Flexion 35 deg, Extension 25 deg, Side Bending to 25 deg bilaterally,  Rotation to 25 deg bilaterally  SLR laying: Negative  XSLR laying: Negative  Palpable tenderness: Moderate tenderness in the paraspinal musculature as well as left SI joint and left piriformis muscle FABER: Mildly positive left side Sensory change: Gross sensation intact to all lumbar and sacral dermatomes.  Reflexes: 2+ at both patellar tendons, 2+ at achilles tendons, Babinski's downgoing.  Strength at foot  Plantar-flexion: 5/5 Dorsi-flexion: 5/5 Eversion: 5/5 Inversion: 5/5  Leg strength  Quad: 5/5 Hamstring: 5/5 Hip flexor: 4/5 Hip abductors: 4/5  Gait unremarkable. Hip: ROM IR: 15 Deg, ER: 35  Deg, Flexion: 100 Deg, Extension: 100 Deg, Abduction: 45 Deg, Adduction: 45 Deg Strength IR: 4/5, ER: 5/5, Flexion: 5/5, Extension: 5/5, Abduction: 5/5, Adduction: 5/5 Pelvic alignment unremarkable to inspection and  palpation. Standing hip rotation and gait without trendelenburg sign / unsteadiness. Greater trochanter without tenderness to palpation. Once again tender over the left SI joint and the left piriformis muscle. No significant change from previous exam.  Procedure: Real-time Ultrasound Guided Injection of left sacroiliac joint.  Device: GE Logiq E  Ultrasound guided injection is preferred based studies that show increased duration, increased effect, greater accuracy, decreased procedural pain, increased response rate, and decreased cost with ultrasound guided versus blind injection.  Verbal informed consent obtained.  Time-out conducted.  Noted no overlying erythema, induration, or other signs of local infection.  Skin prepped in a sterile fashion.  Local anesthesia: Topical Ethyl chloride.  With sterile technique and under real time ultrasound guidance:   Completed without difficulty  Pain immediately resolved suggesting accurate placement of the medication.  Advised to call if fevers/chills, erythema, induration, drainage, or persistent bleeding.  Images permanently stored and available for review in the ultrasound unit.  Impression: Technically successful ultrasound guided injection.   Impression and Recommendations:     This case required medical decision making of moderate complexity.

## 2014-11-26 NOTE — Patient Instructions (Signed)
Good to see you Happy New Year! Kenalog and marcaine is what I used today.  Continue the exercises and the icing.  Conitnue the natural medicines as well.  See me again in 4 weeks.

## 2014-11-26 NOTE — Assessment & Plan Note (Signed)
Patient was given an injection today and tolerated the procedure very well. We discussed icing regimen and home exercises. We discussed postinjection instructions. I do believe the patient will do better overall. This will hopefully be diagnostic as well as therapeutic. Patient continues to have discomfort and we do need to think that patient's severe back arthritis and spinal stenosis could be contributing.  Patient will follow-up again in 3 weeks for further evaluation and treatment.  Spent greater than 25 minutes with patient face-to-face and had greater than 50% of counseling including as described above in assessment and plan.

## 2014-11-26 NOTE — [facility] Notes (Signed)
Pre visit review using our clinic review tool, if applicable. No additional management support is needed unless otherwise documented below in the visit note. 

## 2014-12-24 ENCOUNTER — Encounter: Payer: Self-pay | Admitting: Family Medicine

## 2014-12-24 ENCOUNTER — Ambulatory Visit (INDEPENDENT_AMBULATORY_CARE_PROVIDER_SITE_OTHER): Payer: Medicare Other | Admitting: Family Medicine

## 2014-12-24 VITALS — BP 114/80 | HR 79 | Ht 60.0 in | Wt 136.0 lb

## 2014-12-24 DIAGNOSIS — M533 Sacrococcygeal disorders, not elsewhere classified: Secondary | ICD-10-CM | POA: Diagnosis not present

## 2014-12-24 NOTE — Progress Notes (Signed)
[facility] [place],  [address] Phone: [phone] Subjective:     CC: Back pain and hip pain follow up  BOF:BPZWCHENID Alexandra Lindsey is a 74 y.o. female coming in with complaint of back pain and hip pain. Patient has been followed by neurology for Parkinson's disease. Patient does have a known history of spinal stenosis. Patient did have a further workup recently with MRI of the lumbar spine and hip. These were reviewed by me today. Patient's MRI of the lumbar spine on 10/23/2014 showed spondylosis with degenerative disc disease with moderate impingement at L4-L5 and mild impingement at L5-S1.    Patient for her back has had epidural steroid injections in December 2014 as well as April 2015. Patient states the epidural steroid injection did not seem to be beneficial.   Patient was seen and was treated more for a sacroiliac joint dysfunction and arthritis. Patient has been doing the home exercises and states that this has improved in patient states that after the last injection and has been significantly better as well. Patient feels like she can do significant doing more activity. Patient states that she is ready to start her exercise classes again. Patient is taking the over-the-counter medications regularly.    Past medical history, social, surgical and family history all reviewed in electronic medical record.   Review of Systems: No headache, visual changes, nausea, vomiting, diarrhea, constipation, dizziness, abdominal pain, skin rash, fevers, chills, night sweats, weight loss, swollen lymph nodes, body aches, joint swelling, muscle aches, chest pain, shortness of breath, mood changes.   Objective Blood pressure 114/80, pulse 79, height 5' (1.524 m), weight 136 lb (61.689 kg), SpO2 99 %.  General: No apparent distress alert and oriented x3 mood and affect normal, dressed appropriately.  HEENT: Pupils equal, extraocular movements  intact  Respiratory: Patient's speak in full sentences and does not appear short of breath  Cardiovascular: No lower extremity edema, non tender, no erythema  Skin: Warm dry intact with no signs of infection or rash on extremities or on axial skeleton.  Abdomen: Soft nontender  Neuro: Cranial nerves II through XII are intact, neurovascularly intact in all extremities with 2+ DTRs and 2+ pulses.  Lymph: No lymphadenopathy of posterior or anterior cervical chain or axillae bilaterally.  Gait normal with good balance and coordination.  MSK:  Non tender with full range of motion and good stability and symmetric strength and tone of shoulders, elbows, wrist, hip, knee and ankles bilaterally.  Back Exam:  Inspection: Mild increase in kyphosis Motion: Flexion 35 deg, Extension 25 deg, Side Bending to 25 deg bilaterally,  Rotation to 25 deg bilaterally  SLR laying: Negative  XSLR laying: Negative  Palpable tenderness: Only mildly tender over the left SI joint FABER: Mildly positive left side with some mild improvement Sensory change: Gross sensation intact to all lumbar and sacral dermatomes.  Reflexes: 2+ at both patellar tendons, 2+ at achilles tendons, Babinski's downgoing.  Strength at foot  Plantar-flexion: 5/5 Dorsi-flexion: 5/5 Eversion: 5/5 Inversion: 5/5  Leg strength  Quad: 5/5 Hamstring: 5/5 Hip flexor: 4/5 Hip abductors: 4/5  Gait unremarkable. Hip: ROM IR: 15 Deg, ER: 35 Deg, Flexion: 100 Deg, Extension: 100 Deg, Abduction: 45 Deg, Adduction: 45 Deg Strength IR: 4/5, ER: 5/5, Flexion: 5/5, Extension: 5/5, Abduction: 5/5, Adduction: 5/5 Pelvic alignment unremarkable to inspection and palpation. Standing hip rotation and gait without trendelenburg sign / unsteadiness. Greater trochanter without tenderness to palpation. Lifting decrease in  the tenderness over the left SI joint No significant change from previous exam.     Impression and Recommendations:     This case  required medical decision making of moderate complexity.

## 2014-12-24 NOTE — Patient Instructions (Signed)
Good to see you as always Alexandra Lindsey is your friend after activity Try the pennsaid twice daily Love the idea of yoga.  Avoid the rowing for now.  Continue the vitamins See me in 2 months and if needed we can do an injection.

## 2014-12-24 NOTE — [facility] Notes (Signed)
Pre visit review using our clinic review tool, if applicable. No additional management support is needed unless otherwise documented below in the visit note. 

## 2014-12-24 NOTE — Assessment & Plan Note (Signed)
Patient is doing better at this time. We discussed icing regimen and home exercises. We discussed range of motion exercises that were going to be more beneficial. We discussed which activities would be better and which ones could cause more difficult he. We discussed that we can repeat an injection every 3 months if necessary. Patient will continue to avoid any significant over-the-counter anti-inflammatories and a tourniquet reflux disease. Patient will be following up with her neurologist in the near future for her Parkinson's which seems to be almost non-apparent on exam today.

## 2015-01-04 ENCOUNTER — Encounter: Payer: Self-pay | Admitting: Physician Assistant

## 2015-01-04 ENCOUNTER — Ambulatory Visit (INDEPENDENT_AMBULATORY_CARE_PROVIDER_SITE_OTHER): Payer: Medicare Other | Admitting: Physician Assistant

## 2015-01-04 VITALS — BP 122/70 | HR 72 | Temp 97.7°F | Resp 17 | Ht 60.5 in | Wt 139.0 lb

## 2015-01-04 DIAGNOSIS — E559 Vitamin D deficiency, unspecified: Secondary | ICD-10-CM

## 2015-01-04 DIAGNOSIS — G5702 Lesion of sciatic nerve, left lower limb: Secondary | ICD-10-CM

## 2015-01-04 DIAGNOSIS — R7303 Prediabetes: Secondary | ICD-10-CM

## 2015-01-04 DIAGNOSIS — R7309 Other abnormal glucose: Secondary | ICD-10-CM

## 2015-01-04 DIAGNOSIS — M533 Sacrococcygeal disorders, not elsewhere classified: Secondary | ICD-10-CM

## 2015-01-04 DIAGNOSIS — Z79899 Other long term (current) drug therapy: Secondary | ICD-10-CM | POA: Diagnosis not present

## 2015-01-04 DIAGNOSIS — I10 Essential (primary) hypertension: Secondary | ICD-10-CM | POA: Diagnosis not present

## 2015-01-04 DIAGNOSIS — G20 Parkinson's disease: Secondary | ICD-10-CM

## 2015-01-04 DIAGNOSIS — E785 Hyperlipidemia, unspecified: Secondary | ICD-10-CM

## 2015-01-04 DIAGNOSIS — K2100 Gastro-esophageal reflux disease with esophagitis, without bleeding: Secondary | ICD-10-CM

## 2015-01-04 DIAGNOSIS — G479 Sleep disorder, unspecified: Secondary | ICD-10-CM

## 2015-01-04 LAB — BASIC METABOLIC PANEL WITH GFR
BUN: 23 mg/dL (ref 6–23)
CO2: 29 mEq/L (ref 19–32)
CREATININE: 0.82 mg/dL (ref 0.50–1.10)
Calcium: 9.2 mg/dL (ref 8.4–10.5)
Chloride: 106 mEq/L (ref 96–112)
GFR, EST NON AFRICAN AMERICAN: 72 mL/min
GFR, Est African American: 83 mL/min
Glucose, Bld: 89 mg/dL (ref 70–99)
POTASSIUM: 4.5 meq/L (ref 3.5–5.3)
Sodium: 141 mEq/L (ref 135–145)

## 2015-01-04 LAB — HEPATIC FUNCTION PANEL
AST: 15 U/L (ref 0–37)
Albumin: 4 g/dL (ref 3.5–5.2)
Alkaline Phosphatase: 74 U/L (ref 39–117)
BILIRUBIN TOTAL: 0.5 mg/dL (ref 0.2–1.2)
Bilirubin, Direct: 0.1 mg/dL (ref 0.0–0.3)
Indirect Bilirubin: 0.4 mg/dL (ref 0.2–1.2)
Total Protein: 6 g/dL (ref 6.0–8.3)

## 2015-01-04 LAB — CBC WITH DIFFERENTIAL/PLATELET
BASOS ABS: 0 10*3/uL (ref 0.0–0.1)
Basophils Relative: 0 % (ref 0–1)
Eosinophils Absolute: 0.1 10*3/uL (ref 0.0–0.7)
Eosinophils Relative: 2 % (ref 0–5)
HEMATOCRIT: 38.4 % (ref 36.0–46.0)
HEMOGLOBIN: 12.6 g/dL (ref 12.0–15.0)
LYMPHS ABS: 1.9 10*3/uL (ref 0.7–4.0)
Lymphocytes Relative: 36 % (ref 12–46)
MCH: 30.3 pg (ref 26.0–34.0)
MCHC: 32.8 g/dL (ref 30.0–36.0)
MCV: 92.3 fL (ref 78.0–100.0)
MPV: 10.3 fL (ref 8.6–12.4)
Monocytes Absolute: 0.4 10*3/uL (ref 0.1–1.0)
Monocytes Relative: 8 % (ref 3–12)
NEUTROS ABS: 2.8 10*3/uL (ref 1.7–7.7)
NEUTROS PCT: 54 % (ref 43–77)
Platelets: 270 10*3/uL (ref 150–400)
RBC: 4.16 MIL/uL (ref 3.87–5.11)
RDW: 13.4 % (ref 11.5–15.5)
WBC: 5.2 10*3/uL (ref 4.0–10.5)

## 2015-01-04 LAB — LIPID PANEL
CHOL/HDL RATIO: 3.1 ratio
CHOLESTEROL: 211 mg/dL — AB (ref 0–200)
HDL: 67 mg/dL (ref >39)
LDL Cholesterol: 127 mg/dL — ABNORMAL HIGH (ref 0–99)
Triglycerides: 83 mg/dL (ref <150)
VLDL: 17 mg/dL (ref 0–40)

## 2015-01-04 LAB — MAGNESIUM: Magnesium: 2.1 mg/dL (ref 1.5–2.5)

## 2015-01-04 NOTE — Progress Notes (Signed)
Assessment and Plan:  Hypertension: Continue medication, monitor blood pressure at home. Continue DASH diet.  Reminder to go to the ER if any CP, SOB, nausea, dizziness, severe HA, changes vision/speech, left arm numbness and tingling, and jaw pain. Cholesterol: Continue diet and exercise. Check cholesterol.  Pre-diabetes-Continue diet and exercise. Check A1C Vitamin D Def- check level and continue medications.  Atypical CP- will give samples of GERD med and diet discussed, if CP with exertion or if gets other accompaniments will go to ER/call the office  Continue diet and meds as discussed. Further disposition pending results of labs.  HPI 74 y.o. female  presents for 3 month follow up with hypertension, hyperlipidemia, prediabetes and vitamin D.  Her blood pressure has been controlled at home, HCTZ takes PRN very rare, today their BP is BP: 122/70 mmHg  She does not workout due to her hip, she has been doing some PT exercises at home. She denies chest pain, shortness of breath, dizziness.  She is not on cholesterol medication and denies myalgias. Her cholesterol is at goal. The cholesterol last visit was:   Lab Results  Component Value Date   CHOL 191 09/08/2014   HDL 57 09/08/2014   LDLCALC 120* 09/08/2014   TRIG 68 09/08/2014   CHOLHDL 3.4 09/08/2014    She has been working on diet and exercise for prediabetes, and denies paresthesia of the feet, polydipsia, polyuria and visual disturbances. Last A1C in the office was:  Lab Results  Component Value Date   HGBA1C 5.8* 09/08/2014  Patient is on Vitamin D supplement.   Lab Results  Component Value Date   VD25OH 2 09/08/2014     She was having back/hip pain, has seen Dr. Lorin Mercy, was put on gabapentin 100mg  at night which helps and started to see Dr. Tamala Julian who gave her injection into piriformis muscle which helped.  She has parkinson's and follows with her neurologist.  She has atypical nonexertional chest pain while sitting/lying at  night, 1/week, no accompaniments. She does get GERD occ.  Current Medications:  Current Outpatient Prescriptions on File Prior to Visit  Medication Sig Dispense Refill  . carbidopa-levodopa (SINEMET IR) 25-100 MG per tablet Take 1 tablet by mouth at 5:30am/6am/12pm/6pm and 1/2 tablet by mouth at 9am/3pm 450 tablet 1  . cholecalciferol (VITAMIN D) 1000 UNITS tablet Take 5,000 Units by mouth daily.    . cyanocobalamin 500 MCG tablet Take 500 mcg by mouth daily.    [name] gabapentin (NEURONTIN) 100 MG capsule Take 100 mg by mouth at bedtime.     . hydrochlorothiazide (HYDRODIURIL) 25 MG tablet Take 25 mg by mouth daily as needed (fluid retention).     . Magnesium Oxide 500 MG TABS Take 1 tablet by mouth daily.    . Omega-3 Fatty Acids (FISH OIL) 1000 MG CAPS Take 1 capsule by mouth daily.    [name] OVER THE COUNTER MEDICATION Curamin    . pramipexole (MIRAPEX) 1 MG tablet Take 1 tablet (1 mg total) by mouth 3 (three) times daily. 90 tablet 5  . ranitidine (ZANTAC) 150 MG tablet Take 150 mg by mouth daily as needed. For acid reflex     No current facility-administered medications on file prior to visit.   Medical History:  Past Medical History  Diagnosis Date  . Parkinson's disease 10-07-12  . 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 Never smoked tobacco   . Spinal stenosis   . Costochondritis   . Hyperlipidemia 10-07-12  . Hypertension   .  GERD (gastroesophageal reflux disease)   . Prediabetes   . Parkinson's disease    Allergies:  Allergies  Allergen Reactions  . Macrobid [Nitrofurantoin Macrocrystal] Swelling and Rash  . Cephalosporins Other (See Comments)    Cannot sleep  . Codeine Nausea Only    dizziness  . Darvocet [Propoxyphene N-Acetaminophen] Nausea And Vomiting  . Levaquin [Levofloxacin] Hives  . Penicillins Rash  . Selegiline Hcl Rash  . Tramadol Nausea And Vomiting  . Triamcinolone Itching    Review of Systems:  Review of Systems  Constitutional: Negative.   HENT:  Negative.   Eyes: Negative.   Respiratory: Negative.   Cardiovascular: Positive for chest pain (atypical, nonexertional, no radiation/no accompaniments). Negative for palpitations, orthopnea, claudication, leg swelling and PND.  Gastrointestinal: Negative.   Genitourinary: Negative.   Musculoskeletal: Positive for myalgias and joint pain. Negative for back pain, falls and neck pain.  Skin: Negative.   Psychiatric/Behavioral: Negative.     Family history- Review and unchanged Social history- Review and unchanged Physical Exam: BP 122/70 mmHg  Pulse 72  Temp(Src) 97.7 F (36.5 C)  Resp 17  Ht 5' 0.5" (1.537 m)  Wt 139 lb (63.05 kg)  BMI 26.69 kg/m2 Wt Readings from Last 3 Encounters:  01/04/15 139 lb (63.05 kg)  12/24/14 136 lb (61.689 kg)  11/26/14 137 lb (62.143 kg)   General Appearance: Well nourished, in no apparent distress. Eyes: PERRLA, EOMs, conjunctiva no swelling or erythema Sinuses: No Frontal/maxillary tenderness ENT/Mouth: Ext aud canals clear, TMs without erythema, bulging. No erythema, swelling, or exudate on post pharynx.  Tonsils not swollen or erythematous. Hearing normal.  Neck: Supple, thyroid normal.  Respiratory: Respiratory effort normal, BS equal bilaterally without rales, rhonchi, wheezing or stridor.  Cardio: RRR with no MRGs. Brisk peripheral pulses without edema.  Abdomen: Soft, + BS,  Non tender, no guarding, rebound, hernias, masses. Lymphatics: Non tender without lymphadenopathy.  Musculoskeletal: Full ROM, 5/5 strength, Normal gait Skin: Warm, dry without rashes, lesions, ecchymosis.  Neuro: Cranial nerves intact. Normal muscle tone, no cerebellar symptoms. Psych: Awake and oriented X 3, normal affect, Insight and Judgment appropriate.    Vicie Mutters, PA-C 10:07 AM [facility]

## 2015-01-04 NOTE — Patient Instructions (Signed)
Take dexilant samples once a day for 5 days, then go to zantac 150-300 mg at night for 2 weeks, then you can stop.  Avoid alcohol, spicy foods, NSAIDS (aleve, ibuprofen) at this time. See foods below.   Food Choices for Gastroesophageal Reflux Disease When you have gastroesophageal reflux disease (GERD), the foods you eat and your eating habits are very important. Choosing the right foods can help ease the discomfort of GERD. WHAT GENERAL GUIDELINES DO I NEED TO FOLLOW?  Choose fruits, vegetables, whole grains, low-fat dairy products, and low-fat meat, fish, and poultry.  Limit fats such as oils, salad dressings, butter, nuts, and avocado.  Keep a food diary to identify foods that cause symptoms.  Avoid foods that cause reflux. These may be different for different people.  Eat frequent small meals instead of three large meals each day.  Eat your meals slowly, in a relaxed setting.  Limit fried foods.  Cook foods using methods other than frying.  Avoid drinking alcohol.  Avoid drinking large amounts of liquids with your meals.  Avoid bending over or lying down until 2-3 hours after eating. WHAT FOODS ARE NOT RECOMMENDED? The following are some foods and drinks that may worsen your symptoms: Vegetables Tomatoes. Tomato juice. Tomato and spaghetti sauce. Chili peppers. Onion and garlic. Horseradish. Fruits Oranges, grapefruit, and lemon (fruit and juice). Meats High-fat meats, fish, and poultry. This includes hot dogs, ribs, ham, sausage, salami, and bacon. Dairy Whole milk and chocolate milk. Sour cream. Cream. Butter. Ice cream. Cream cheese.  Beverages Coffee and tea, with or without caffeine. Carbonated beverages or energy drinks. Condiments Hot sauce. Barbecue sauce.  Sweets/Desserts Chocolate and cocoa. Donuts. Peppermint and spearmint. Fats and Oils High-fat foods, including [place] fries and potato chips. Other Vinegar. Strong spices, such as black pepper, white  pepper, red pepper, cayenne, curry powder, cloves, ginger, and chili powder.

## 2015-01-05 LAB — TSH: TSH: 1.513 u[IU]/mL (ref 0.350–4.500)

## 2015-01-05 LAB — HEMOGLOBIN A1C
Hgb A1c MFr Bld: 5.7 % — ABNORMAL HIGH (ref <5.7)
Mean Plasma Glucose: 117 mg/dL — ABNORMAL HIGH (ref <117)

## 2015-01-07 ENCOUNTER — Encounter: Payer: Self-pay | Admitting: Neurology

## 2015-01-07 ENCOUNTER — Ambulatory Visit (INDEPENDENT_AMBULATORY_CARE_PROVIDER_SITE_OTHER): Payer: Medicare Other | Admitting: Neurology

## 2015-01-07 VITALS — BP 110/78 | HR 80 | Ht 60.0 in | Wt 136.0 lb

## 2015-01-07 DIAGNOSIS — G5702 Lesion of sciatic nerve, left lower limb: Secondary | ICD-10-CM

## 2015-01-07 DIAGNOSIS — G20 Parkinson's disease: Secondary | ICD-10-CM

## 2015-01-07 DIAGNOSIS — G4752 REM sleep behavior disorder: Secondary | ICD-10-CM

## 2015-01-07 NOTE — Progress Notes (Signed)
Alexandra Lindsey was seen today in the movement disorders clinic for neurologic consultation at the request of MCKEOWN,WILLIAM DAVID, MD.  The patient has previously seen both Dr. Erling Cruz and Dr. Rexene Alberts.  The records that were available to me were reviewed.  The consultation is for the evaluation and treatment of Parkinson's disease.  The patient has had a diagnosis of Parkinson's disease since at least 2002.  The first symptom was inability to get out of a chair and dragging the L leg and shuffling.  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    The patient was placed on Mirapex in 2002 and remains on that medication.  Once she was started on that medication she could immediately clog again.  She is currently on 1 mg 3 times a day.  She was placed on Stalevo 3 or 4 years later and is currently on Stalevo 100 mg, one tablet at 6 AM, half a tablet at 9 AM, 1 tablet at noon, half a tablet at 3 PM and one tablet at 6 PM.  In addition, she takes a regular carbidopa/levodopa 25/100 in the AM and chews it.  If she doesn't do that, she shuffles.  She chews that pill and then 45 mins later starts the Stalevo regimen.  She does state that Stalevo has become quite expensive for her and her insurance is going to change and asks about alternatives.    10/06/14 update:  The patient presents today, accompanied by her husband to supplement the history.  Last visit, I discontinued the patient's Stalevo, primarily because of cost and changed her to cupboard up a/levodopa 25/100, and we decided to hold the entacapone component.  She chews one tablet in the morning followed by carbidopa/levodopa 25/100 one tablet at 6 AM, half a tablet at 9 AM, 1 tablet at noon, half a tablet at 3 PM and one tablet at 6 PM.  She admits that she did not even take her 3 PM dose today, and is not sure that she even needs it.  Overall, from a Parkinson standpoint she feels great.  She admits to  some dyskinesia in the lower legs, but states that that is not bothersome.  Her biggest issue continues to be low back pain and left hip pain.  She has been back to Dr. Neomia Dear but did not get much relief.  She subsequently saw Dr. Lorin Mercy at [facility].  She was started on 100 mg of Neurontin at bedtime, but that only helped her sleep.  She states that first thing in the morning she has such pain in the left hip and back that she can barely walk.  01/07/15 update:  Pt f/u today re: PD.  She is on carbidopa/levodopa 25/100.  She chews one tablet in the morning followed by carbidopa/levodopa 25/100 one tablet at 6 AM, half a tablet at 9 AM, 1 tablet at noon, half a tablet at 3 PM and one tablet at 6 PM.  She is also on mirapex 1 mg tid.  She came off of meds today so that I could see what she looks like off.  She feels very stiff.  She feels that she isnt moving well.  She did have one fall since last visit.  She fell off of the stool at her vanity.   She didn't get hurt.   She saw Dr. Tamala Julian since last visit.  I  reviewed those records.  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  She is doing much better in that regard.  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 Macrobid [Nitrofurantoin Macrocrystal] Swelling and Rash  . Cephalosporins Other (See Comments)    Cannot sleep  . Codeine Nausea Only    dizziness  . Darvocet [Propoxyphene N-Acetaminophen] Nausea And Vomiting  . Levaquin [Levofloxacin] Hives  . Penicillins Rash  . Selegiline Hcl Rash  . Tramadol Nausea And Vomiting  . Triamcinolone Itching    CURRENT MEDICATIONS:  Outpatient Encounter Prescriptions as of 01/07/2015  Medication Sig  . carbidopa-levodopa (SINEMET IR) 25-100 MG per tablet Take 1 tablet by mouth at  5:30am/6am/12pm/6pm and 1/2 tablet by mouth at 9am/3pm  . cholecalciferol (VITAMIN D) 1000 UNITS tablet Take 5,000 Units by mouth daily.  . cyanocobalamin 500 MCG tablet Take 500 mcg by mouth daily.  [name] gabapentin (NEURONTIN) 100 MG capsule Take 100 mg by mouth at bedtime.   . Magnesium Oxide 500 MG TABS Take 1 tablet by mouth daily.  . Omega-3 Fatty Acids (FISH OIL) 1000 MG CAPS Take 1 capsule by mouth daily.  [name] OVER THE COUNTER MEDICATION Curamin  . pramipexole (MIRAPEX) 1 MG tablet Take 1 tablet (1 mg total) by mouth 3 (three) times daily.  . ranitidine (ZANTAC) 150 MG tablet Take 150 mg by mouth daily as needed. For acid reflex  . [DISCONTINUED] hydrochlorothiazide (HYDRODIURIL) 25 MG tablet Take 25 mg by mouth daily as needed (fluid retention).     PAST MEDICAL HISTORY:   Past Medical History  Diagnosis Date  . Parkinson's disease 10-07-12  . 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 Never smoked tobacco   . Spinal stenosis   . Costochondritis   . Hyperlipidemia 10-07-12  . Hypertension   . GERD (gastroesophageal reflux disease)   . Prediabetes   . Parkinson's disease     PAST SURGICAL HISTORY:   Past Surgical History  Procedure Laterality Date  . Abdominal hysterectomy    . Hernia repair    . Rotator cuff repair Left 10-07-12  . Bunionectomy Bilateral 11-11-3  . Cardiac catheterization Bilateral 2013  . Left heart catheterization with coronary angiogram N/A 07/03/2012    Procedure: LEFT HEART CATHETERIZATION WITH CORONARY ANGIOGRAM;  Surgeon: Troy Sine, MD;  Location: [facility] CATH LAB;  Service: Cardiovascular;  Laterality: N/A;    SOCIAL HISTORY:   History   Social History  . Marital Status: Married    Spouse Name: N/A  . Number of Children: N/A  . Years of Education: N/A   Occupational History  . retired     Chiropractor   Social History Main Topics  . Smoking status: Never Smoker   . Smokeless tobacco: Not on file  . Alcohol Use: No  . Drug Use: No    . Sexual Activity: Not on file   Other Topics Concern  . Not on file   Social History Narrative    FAMILY HISTORY:   Family Status  Relation Status Death Age  . Mother Deceased     esophageal cancer, heart attack  . Father Deceased     suicide  . Brother Deceased     suicide  . Brother Alive     HTN, hypercholesterolemia  . Brother Alive  mental retardation    ROS:      A complete 10 system review of systems was obtained and was unremarkable apart from what is mentioned above.  PHYSICAL EXAMINATION:    VITALS:   Filed Vitals:   01/07/15 1256  BP: 110/78  Pulse: 80  Height: 5' (1.524 m)  Weight: 136 lb (61.689 kg)    GEN:  The patient appears stated age and is in NAD. HEENT:  Normocephalic, atraumatic.  The mucous membranes are moist. The superficial temporal arteries are without ropiness or tenderness. CV:  RRR Lungs:  CTAB Neck/HEME:  There are no carotid bruits bilaterally.  Neurological examination:  Orientation: The patient is alert and oriented x3. Fund of knowledge is appropriate.  Recent and remote memory are intact.  Attention and concentration are normal.    Able to name objects and repeat phrases. Cranial nerves: There is good facial symmetry.  No significant facial hypomimia.  Pupils are equal round and reactive to light bilaterally. Fundoscopic exam reveals clear margins bilaterally. Extraocular muscles are intact.  There are no square wave jerks.  The visual fields are full to confrontational testing. The speech is fluent and clear. Soft palate rises symmetrically and there is no tongue deviation. Hearing is intact to conversational tone. Sensation: Sensation is intact to light touch throughout. Motor: Strength is 5/5 in the bilateral upper and lower extremities.   Shoulder shrug is equal and symmetric.  There is no pronator drift.  Movement examination: Tone: There is normal tone in the bilateral upper extremities.  The tone in the lower  extremities is normal.  Abnormal movements: no dyskinesia noted today. Coordination:  There is mild significant decremation with RAM's in the LLE>LUE Gait and Station: The patient has no significant difficulty arising out of a deep-seated chair without the use of the hands. The patient's stride length is greatly decreased with decreased arm swing, shuffling and she turns en bloc.   ASSESSMENT/PLAN:  1.  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  I did not change her meds and she will continue her carbidopa/levodopa 25/100 one tablet chewed up in the morning followed by carbidopa/levodopa 25/100 one tablet at 6 AM, half a tablet at 9 AM, 1 tablet at noon, half a tablet at 3 PM and one tablet at 6 PM.   -Talked to her about risks of mirapex 1 mg tid as she ages but didn't change that today.    -We talked about the value of safe, cardiovascular exercise.  Greater than 50% of the 60 minute visit spent in counseling.  -invited her to march PD event in [place].  Pt education provided. 2.  Piriformis syndrome  -Seeing Dr. Tamala Julian and doing much better. 3.  REM behavior disorder.  -  She did not want to take the clonazepam, primarily because of its addictive properties.  She did try it for a short period of time.  She tried Seroquel as well, but it caused diplopia.  She does not want to try anything further right now. 4.  followup in the next few months, sooner should new neurologic issues arise.

## 2015-02-16 ENCOUNTER — Telehealth: Payer: Self-pay | Admitting: Family Medicine

## 2015-02-16 NOTE — Telephone Encounter (Signed)
Spoke to pt & scheduled her for tomorrow @ 1:15p.

## 2015-02-16 NOTE — Telephone Encounter (Signed)
Patient states she has an upcoming appointment on the 31st.  She is requesting to come in sooner if she can have another injection.  States the pain is getting worse.

## 2015-02-17 ENCOUNTER — Encounter: Payer: Self-pay | Admitting: Family Medicine

## 2015-02-17 ENCOUNTER — Other Ambulatory Visit (INDEPENDENT_AMBULATORY_CARE_PROVIDER_SITE_OTHER): Payer: Medicare Other

## 2015-02-17 ENCOUNTER — Ambulatory Visit (INDEPENDENT_AMBULATORY_CARE_PROVIDER_SITE_OTHER): Payer: Medicare Other | Admitting: Family Medicine

## 2015-02-17 VITALS — BP 106/70 | HR 81 | Wt 140.0 lb

## 2015-02-17 DIAGNOSIS — M7062 Trochanteric bursitis, left hip: Secondary | ICD-10-CM

## 2015-02-17 DIAGNOSIS — M25552 Pain in left hip: Secondary | ICD-10-CM | POA: Diagnosis not present

## 2015-02-17 NOTE — Progress Notes (Signed)
[facility] [address], [place] [address] Phone: [phone] Subjective:     CC: Back pain and hip pain follow up  QJJ:[id number] Alexandra Lindsey is a 74 y.o. female coming in with complaint of back pain and hip pain. Patient has been followed by neurology for Parkinson's disease. Patient does have a known history of spinal stenosis. Patient did have a further workup recently with MRI of the lumbar spine and hip. These were reviewed by me today. Patient's MRI of the lumbar spine on 10/23/2014 showed spondylosis with degenerative disc disease with moderate impingement at L4-L5 and mild impingement at L5-S1.    Patient for her back has had epidural steroid injections in December 2014 as well as April 2015. Patient states the epidural steroid injection did not seem to be beneficial.   Patient is having more pain on the lateral aspect of the left hip. Patient discusses as well as dull aching pain. States that he can wake her up at night. Denies any radiation down the leg. Seems to be a little bit different than what her previous pain has been.     Past medical history, social, surgical and family history all reviewed in electronic medical record.   Review of Systems: No headache, visual changes, nausea, vomiting, diarrhea, constipation, dizziness, abdominal pain, skin rash, fevers, chills, night sweats, weight loss, swollen lymph nodes, body aches, joint swelling, muscle aches, chest pain, shortness of breath, mood changes.   Objective Blood pressure 106/70, pulse 81, weight 140 lb (63.504 kg), SpO2 99 %.  General: No apparent distress alert and oriented x3 mood and affect normal, dressed appropriately.  HEENT: Pupils equal, extraocular movements intact  Respiratory: Patient's speak in full sentences and does not appear short of breath  Cardiovascular: No lower extremity edema, non tender, no erythema  Skin: Warm dry intact with no signs of infection  or rash on extremities or on axial skeleton.  Abdomen: Soft nontender  Neuro: Cranial nerves II through XII are intact, neurovascularly intact in all extremities with 2+ DTRs and 2+ pulses.  Lymph: No lymphadenopathy of posterior or anterior cervical chain or axillae bilaterally.  Gait normal with good balance and coordination.  MSK:  Non tender with full range of motion and good stability and symmetric strength and tone of shoulders, elbows, wrist, hip, knee and ankles bilaterally.  Back Exam:  Inspection: Mild increase in kyphosis Motion: Flexion 35 deg, Extension 25 deg, Side Bending to 25 deg bilaterally,  Rotation to 25 deg bilaterally  SLR laying: Negative  XSLR laying: Negative  Palpable tenderness: Tender over the lateral side of the hip FABER: Mildly positive left side with some mild improvement Sensory change: Gross sensation intact to all lumbar and sacral dermatomes.  Reflexes: 2+ at both patellar tendons, 2+ at achilles tendons, Babinski's downgoing.  Strength at foot  Plantar-flexion: 5/5 Dorsi-flexion: 5/5 Eversion: 5/5 Inversion: 5/5  Leg strength  Quad: 5/5 Hamstring: 5/5 Hip flexor: 4/5 Hip abductors: 4/5  Gait unremarkable. Hip: ROM IR: 15 Deg, ER: 35 Deg, Flexion: 100 Deg, Extension: 100 Deg, Abduction: 45 Deg, Adduction: 45 Deg Strength IR: 4/5, ER: 5/5, Flexion: 5/5, Extension: 5/5, Abduction: 4/5, Adduction: 5/5 Pelvic alignment unremarkable to inspection and palpation. Standing hip rotation and gait without trendelenburg sign / unsteadiness. Greater trochanter severe tenderness  MSK US performed of: Left This study was ordered, performed, and interpreted by Charlann Boxer D.O.  Hip: Trochanteric bursa with significant hypoechoic changes and swelling Acetabular labrum  visualized and without tears, displacement, or effusion in joint. Femoral neck appears unremarkable without increased power doppler signal along Cortex.  IMPRESSION:  Greater trochanter  bursitis   Procedure: Real-time Ultrasound Guided Injection of left greater trochanteric bursitis secondary to patient's body habitus Device: GE Logiq E  Ultrasound guided injection is preferred based studies that show increased duration, increased effect, greater accuracy, decreased procedural pain, increased response rate, and decreased cost with ultrasound guided versus blind injection.  Verbal informed consent obtained.  Time-out conducted.  Noted no overlying erythema, induration, or other signs of local infection.  Skin prepped in a sterile fashion.  Local anesthesia: Topical Ethyl chloride.  With sterile technique and under real time ultrasound guidance:  Greater trochanteric area was visualized and patient's bursa was noted. A 22-gauge 3 inch needle was inserted and 4 cc of 0.5% Marcaine and 1 cc of Kenalog 40 mg/dL was injected. Pictures taken Completed without difficulty  Pain immediately resolved suggesting accurate placement of the medication.  Advised to call if fevers/chills, erythema, induration, drainage, or persistent bleeding.  Images permanently stored and available for review in the ultrasound unit.  Impression: Technically successful ultrasound guided injection.        Impression and Recommendations:     This case required medical decision making of moderate complexity.

## 2015-02-17 NOTE — Assessment & Plan Note (Signed)
Visit was given an injection today with near complete resolution of pain. Patient will do home exercises and was given icing pedicle. Patient can always have a repeat sacroiliac joint injection if needed. Patient otherwise will follow-up with me in 3 weeks for further evaluation.

## 2015-02-17 NOTE — Patient Instructions (Addendum)
Good to see you  We tried a different injection today.  Continue the exercises and have some new ones.  Continue the pennsaid twice daily See me again 3 weeks if not perfect Elbow place a pillow under it and never put it on hard surfaces.

## 2015-02-25 ENCOUNTER — Ambulatory Visit: Payer: Medicare Other | Admitting: Family Medicine

## 2015-03-03 ENCOUNTER — Other Ambulatory Visit: Payer: Self-pay | Admitting: Neurology

## 2015-03-03 NOTE — Telephone Encounter (Signed)
Carbidopa Levodopa refill requested. Per last office note- patient to remain on medication. Refill approved and sent to patient's pharmacy.   

## 2015-03-09 ENCOUNTER — Telehealth: Payer: Self-pay | Admitting: Family Medicine

## 2015-03-09 NOTE — Telephone Encounter (Signed)
Pt called in and wanted to know if Dr Tamala Julian thinks she should keep taking the gabapentin (NEURONTIN) 100 MG capsule [[id number]] ?  She doesn't want to  become dependent on a drug to sleep.  What is best?

## 2015-03-09 NOTE — Telephone Encounter (Signed)
lmovm with instructions.

## 2015-03-09 NOTE — Telephone Encounter (Signed)
Continue the medicine, she will not become dependent.

## 2015-03-10 MED ORDER — GABAPENTIN 100 MG PO CAPS
100.0000 mg | ORAL_CAPSULE | Freq: Every day | ORAL | Status: DC
Start: 2015-03-10 — End: 2015-04-02

## 2015-03-10 NOTE — Telephone Encounter (Signed)
rx sent into pharmacy. Pt made aware.  

## 2015-03-10 NOTE — Addendum Note (Signed)
Addended by: Douglass Rivers T on: 03/10/2015 01:59 PM   Modules accepted: Orders

## 2015-03-10 NOTE — Telephone Encounter (Signed)
Patient is calling to follow up on script request. She states that she is at the pharmacy waiting for gabapentin. Confirmed that [facility] is the right pharmacy. Please call the patient cell once sent in.

## 2015-03-29 ENCOUNTER — Encounter: Payer: Self-pay | Admitting: Neurology

## 2015-03-29 ENCOUNTER — Ambulatory Visit (INDEPENDENT_AMBULATORY_CARE_PROVIDER_SITE_OTHER): Payer: Medicare Other | Admitting: Neurology

## 2015-03-29 VITALS — BP 112/80 | HR 76 | Ht 60.0 in | Wt 143.0 lb

## 2015-03-29 DIAGNOSIS — G5702 Lesion of sciatic nerve, left lower limb: Secondary | ICD-10-CM | POA: Diagnosis not present

## 2015-03-29 DIAGNOSIS — G20 Parkinson's disease: Secondary | ICD-10-CM | POA: Diagnosis not present

## 2015-03-29 NOTE — Patient Instructions (Signed)
1. Increase Levodopa to 1 tablet 6 times daily.  2. Appt with Dr Hulan Saas scheduled 04/02/15 at 10:45 am.  3. You have been referred to [facility] for [facility] therapy. They will call you directly to schedule an appointment.  Please call [phone] if you do not hear from them.

## 2015-03-29 NOTE — Progress Notes (Signed)
Alexandra Lindsey was seen today in the movement disorders clinic for neurologic consultation at the request of MCKEOWN,WILLIAM DAVID, MD.  The patient has previously seen both Dr. Erling Cruz and Dr. Rexene Alberts.  The records that were available to me were reviewed.  The consultation is for the evaluation and treatment of Parkinson's disease.  The patient has had a diagnosis of Parkinson's disease since at least 2002.  The first symptom was inability to get out of a chair and dragging the L leg and shuffling.  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    The patient was placed on Mirapex in 2002 and remains on that medication.  Once she was started on that medication she could immediately clog again.  She is currently on 1 mg 3 times a day.  She was placed on Stalevo 3 or 4 years later and is currently on Stalevo 100 mg, one tablet at 6 AM, half a tablet at 9 AM, 1 tablet at noon, half a tablet at 3 PM and one tablet at 6 PM.  In addition, she takes a regular carbidopa/levodopa 25/100 in the AM and chews it.  If she doesn't do that, she shuffles.  She chews that pill and then 45 mins later starts the Stalevo regimen.  She does state that Stalevo has become quite expensive for her and her insurance is going to change and asks about alternatives.    10/06/14 update:  The patient presents today, accompanied by her husband to supplement the history.  Last visit, I discontinued the patient's Stalevo, primarily because of cost and changed her to cupboard up a/levodopa 25/100, and we decided to hold the entacapone component.  She chews one tablet in the morning followed by carbidopa/levodopa 25/100 one tablet at 6 AM, half a tablet at 9 AM, 1 tablet at noon, half a tablet at 3 PM and one tablet at 6 PM.  She admits that she did not even take her 3 PM dose today, and is not sure that she even needs it.  Overall, from a Parkinson standpoint she feels great.  She admits to  some dyskinesia in the lower legs, but states that that is not bothersome.  Her biggest issue continues to be low back pain and left hip pain.  She has been back to Dr. Neomia Dear but did not get much relief.  She subsequently saw Dr. Lorin Mercy at [facility].  She was started on 100 mg of Neurontin at bedtime, but that only helped her sleep.  She states that first thing in the morning she has such pain in the left hip and back that she can barely walk.  01/07/15 update:  Pt f/u today re: PD.  She is on carbidopa/levodopa 25/100.  She chews one tablet in the morning followed by carbidopa/levodopa 25/100 one tablet at 6 AM, half a tablet at 9 AM, 1 tablet at noon, half a tablet at 3 PM and one tablet at 6 PM.  She is also on mirapex 1 mg tid.  She came off of meds today so that I could see what she looks like off.  She feels very stiff.  She feels that she isnt moving well.  She did have one fall since last visit.  She fell off of the stool at her vanity.   She didn't get hurt.   She saw Dr. Tamala Julian since last visit.  I  reviewed those records.  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  She is doing much better in that regard.  03/29/15 update:  Pt states that she is not doing as well as she was because of the back/leg pain again.  She continues to see Dr. Tamala Julian and has had further injections.  This one was in the bursa and she states that this one didn't help.  She is not walking as well.   She states that her PD meds are wearing off before they are due for the next dosing.  She takes one full tablet of levodopa at 5 AM (chewed), one tablet at 8 AM, half a tablet at 11 AM, 1 tablet at 1 PM, half a tablet at 4 PM and one tablet at 6 PM.  She remains on pramipexole 1.0 mg 3 times per day.  She just does not feel like she has the energy that she use to.  She has very little dyskinesia.  She has not had any falls.  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 Macrobid [Nitrofurantoin Macrocrystal] Swelling and Rash  . Cephalosporins Other (See Comments)    Cannot sleep  . Codeine Nausea Only    dizziness  . Darvocet [Propoxyphene N-Acetaminophen] Nausea And Vomiting  . Levaquin [Levofloxacin] Hives  . Penicillins Rash  . Selegiline Hcl Rash  . Tramadol Nausea And Vomiting  . Triamcinolone Itching    CURRENT MEDICATIONS:  Outpatient Encounter Prescriptions as of 03/29/2015  . Order #: [id number] Class: Normal  . Order #: [id number] Class: Historical Med  . Order #: [id number] Class: Historical Med  . Order #: [id number] Class: Normal  . Order #: [id number] Class: Historical Med  . Order #: [id number] Class: Historical Med  . Order #: [id number] Class: Historical Med  . Order #: [id number] Class: Normal  . Order #: [id number] Class: Historical Med    PAST MEDICAL HISTORY:   Past Medical History  Diagnosis Date  . Parkinson's disease 10-07-12  . 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 Never smoked tobacco   . Spinal stenosis   . Costochondritis   . Hyperlipidemia 10-07-12  . Hypertension   . GERD (gastroesophageal reflux disease)   . Prediabetes   . Parkinson's disease     PAST SURGICAL HISTORY:   Past Surgical History  Procedure Laterality Date  . Abdominal hysterectomy    . Hernia repair    . Rotator cuff repair Left 10-07-12  . Bunionectomy Bilateral 11-11-3  . Cardiac catheterization Bilateral 2013  . Left heart catheterization with coronary angiogram N/A 07/03/2012    Procedure: LEFT HEART CATHETERIZATION WITH CORONARY ANGIOGRAM;  Surgeon: Troy Sine, MD;  Location: [facility] CATH LAB;  Service: Cardiovascular;  Laterality: N/A;    SOCIAL HISTORY:   History   Social History  . Marital Status: Married    Spouse Name: N/A  . Number of Children: N/A  . Years of Education:  N/A   Occupational History  . retired     Chiropractor   Social History Main Topics  . Smoking status: Never Smoker   . Smokeless tobacco: Not on file  . Alcohol Use: No  . Drug Use: No  . Sexual Activity: Not on file   Other Topics Concern  . Not on file  Social History Narrative    FAMILY HISTORY:   Family Status  Relation Status Death Age  . Mother Deceased     esophageal cancer, heart attack  . Father Deceased     suicide  . Brother Deceased     suicide  . Brother Alive     HTN, hypercholesterolemia  . Brother Alive     mental retardation    ROS:      A complete 10 system review of systems was obtained and was unremarkable apart from what is mentioned above.  PHYSICAL EXAMINATION:    VITALS:   Filed Vitals:   03/29/15 1346  BP: 112/80  Pulse: 76  Height: 5' (1.524 m)  Weight: 143 lb (64.864 kg)    GEN:  The patient appears stated age and is in NAD. HEENT:  Normocephalic, atraumatic.  The mucous membranes are moist. The superficial temporal arteries are without ropiness or tenderness. CV:  RRR Lungs:  CTAB Neck/HEME:  There are no carotid bruits bilaterally.  Neurological examination:  Orientation: The patient is alert and oriented x3. Fund of knowledge is appropriate.  Recent and remote memory are intact.  Attention and concentration are normal.    Able to name objects and repeat phrases. Cranial nerves: There is good facial symmetry.  No significant facial hypomimia.  Pupils are equal round and reactive to light bilaterally. Fundoscopic exam reveals clear margins bilaterally. Extraocular muscles are intact.  There are no square wave jerks.  The visual fields are full to confrontational testing. The speech is fluent and clear. Soft palate rises symmetrically and there is no tongue deviation. Hearing is intact to conversational tone. Sensation: Sensation is intact to light touch throughout. Motor: Strength is 5/5 in the bilateral upper and lower  extremities.   Shoulder shrug is equal and symmetric.  There is no pronator drift.  Movement examination: Tone: There is mild rigidity in the left upper extremity. Abnormal movements: no dyskinesia noted today. Coordination:  There is mild significant decremation with RAM's in the LLE>LUE Gait and Station: The patient has no significant difficulty arising out of a deep-seated chair without the use of the hands. The patient's stride length is greatly decreased with decreased arm swing, shuffling and she turns en bloc.   ASSESSMENT/PLAN:  1.  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  She looks worse today and I think she does need a levodopa increase.  I am going to change her half tablets to full tablet so that she takes a total of 6 tablets per day of the levodopa on the following schedule: 1 tablet at 5 AM, 1 tablet at 8 AM, 1 tablet at 11 AM, 1 tablet at 1 PM, 1 tablet at 4 PM and one tablet at 6 PM.  She may need a further increase, but I will see her back in about 6 weeks to reevaluate that as she has had dyskinesia in the past.  -Talked to her about risks of mirapex 1 mg tid as she ages but didn't change that today.    -I am going to refer her to the neuro rehabilitation center for physical therapy 2.  Piriformis syndrome  -She is doing worse and I am going to make an appointment again with Dr. Tamala Julian. 3.  REM behavior disorder.  -  She did not want to take the clonazepam, primarily  because of its addictive properties.  She did try it for a short period of time.  She tried Seroquel as well, but it caused diplopia.  She does not want to try anything further right now. 4 .  Follow-up with me in 6 weeks, sooner should new neurologic issues arise.  Greater than 50% of the 30 minute visit in counseling discussing safety.

## 2015-03-31 ENCOUNTER — Other Ambulatory Visit: Payer: Self-pay | Admitting: Neurology

## 2015-04-01 ENCOUNTER — Telehealth: Payer: Self-pay | Admitting: Neurology

## 2015-04-01 MED ORDER — CARBIDOPA-LEVODOPA 25-100 MG PO TABS: 1.0000 | ORAL_TABLET | Freq: Every day | ORAL | Status: DC

## 2015-04-01 NOTE — Telephone Encounter (Signed)
Needs refill on Carbidopa/ Levadopa 25/100 but wants to speak with you before you call it in. Also needs refill on Mirapex 1mg . Uses [facility], CB# [id number]. Pt call back number [phone] / Sherri S.

## 2015-04-01 NOTE — Telephone Encounter (Signed)
Left message on machine for patient to call back. To verify she wants Carbidopa Levodopa 25/100 1 tablet 6 times daily sent to [facility]. Mirapex already sent. Awaiting call back.

## 2015-04-01 NOTE — Telephone Encounter (Signed)
RX sent [facility]. Patient aware.

## 2015-04-01 NOTE — Telephone Encounter (Signed)
Mirapex refill requested. Per last office note- patient to remain on medication. Refill approved and sent to patient's pharmacy.   

## 2015-04-02 ENCOUNTER — Ambulatory Visit (INDEPENDENT_AMBULATORY_CARE_PROVIDER_SITE_OTHER): Payer: Medicare Other | Admitting: Family Medicine

## 2015-04-02 ENCOUNTER — Other Ambulatory Visit (INDEPENDENT_AMBULATORY_CARE_PROVIDER_SITE_OTHER): Payer: Medicare Other

## 2015-04-02 ENCOUNTER — Encounter: Payer: Self-pay | Admitting: Family Medicine

## 2015-04-02 VITALS — BP 112/80 | HR 62 | Ht 60.0 in | Wt 142.0 lb

## 2015-04-02 DIAGNOSIS — M533 Sacrococcygeal disorders, not elsewhere classified: Secondary | ICD-10-CM

## 2015-04-02 MED ORDER — BACLOFEN 10 MG PO TABS: 5.0000 mg | ORAL_TABLET | Freq: Three times a day (TID) | ORAL | Status: DC

## 2015-04-02 MED ORDER — GABAPENTIN 100 MG PO CAPS
200.0000 mg | ORAL_CAPSULE | Freq: Every day | ORAL | Status: DC
Start: 2015-04-02 — End: 2015-05-25

## 2015-04-02 NOTE — Progress Notes (Signed)
[facility] [address], [place] [address] Phone: [phone] Subjective:     CC: Back pain and hip pain follow up  ENI:[id number] BEAUTY PLESS is a 74 y.o. female coming in with complaint of back pain and hip pain. Patient has been followed by neurology for Parkinson's disease. Patient does have a known history of spinal stenosis. Patient did have a further workup recently with MRI of the lumbar spine and hip. These were reviewed by me today. Patient's MRI of the lumbar spine on 10/23/2014 showed spondylosis with degenerative disc disease with moderate impingement at L4-L5 and mild impingement at L5-S1.     Patient for her back has had epidural steroid injections in December 2014 as well as April 2015. Patient states the epidural steroid injection did not seem to be beneficial.   She has seen me previously and had a sacroiliac joint does function and was given an injection long ago. Patient did actually tolerate the procedure and those greater than 5 months ago. That was much more beneficial than the greater trochanteric injection. States that it seems to be localized on the posterior aspect of the back again.   Past medical history, social, surgical and family history all reviewed in electronic medical record.   Review of Systems: No headache, visual changes, nausea, vomiting, diarrhea, constipation, dizziness, abdominal pain, skin rash, fevers, chills, night sweats, weight loss, swollen lymph nodes, body aches, joint swelling, muscle aches, chest pain, shortness of breath, mood changes.   Objective Blood pressure 112/80, pulse 62, height 5' (1.524 m), weight 142 lb (64.411 kg), SpO2 99 %.  General: No apparent distress alert and oriented x3 mood and affect normal, dressed appropriately.  HEENT: Pupils equal, extraocular movements intact  Respiratory: Patient's speak in full sentences and does not appear short of breath  Cardiovascular: No lower  extremity edema, non tender, no erythema  Skin: Warm dry intact with no signs of infection or rash on extremities or on axial skeleton.  Abdomen: Soft nontender  Neuro: Cranial nerves II through XII are intact, neurovascularly intact in all extremities with 2+ DTRs and 2+ pulses.  Lymph: No lymphadenopathy of posterior or anterior cervical chain or axillae bilaterally.  Gait normal with good balance and coordination.  MSK:  Non tender with full range of motion and good stability and symmetric strength and tone of shoulders, elbows, wrist, hip, knee and ankles bilaterally.  Back Exam:  Inspection: Mild increase in kyphosis Motion: Flexion 35 deg, Extension 25 deg, Side Bending to 25 deg bilaterally,  Rotation to 25 deg bilaterally  SLR laying: Negative  XSLR laying: Negative  Palpable tenderness: Tender over the sacroiliac joint today FABER: Mildly positive left side with some mild improvement Sensory change: Gross sensation intact to all lumbar and sacral dermatomes.  Reflexes: 2+ at both patellar tendons, 2+ at achilles tendons, Babinski's downgoing.  Strength at foot  Plantar-flexion: 5/5 Dorsi-flexion: 5/5 Eversion: 5/5 Inversion: 5/5  Leg strength  Quad: 5/5 Hamstring: 5/5 Hip flexor: 4/5 Hip abductors: 4/5  Gait unremarkable. Hip: ROM IR: 15 Deg, ER: 35 Deg, Flexion: 100 Deg, Extension: 100 Deg, Abduction: 45 Deg, Adduction: 45 Deg Strength IR: 4/5, ER: 5/5, Flexion: 5/5, Extension: 5/5, Abduction: 4/5, Adduction: 4/5 Pelvic alignment unremarkable to inspection and palpation. Standing hip rotation and gait without trendelenburg sign / unsteadiness.  Procedure: Real-time Ultrasound Guided Injection of left sacroiliac joint.  Device: GE Logiq E  Ultrasound guided injection is preferred based studies that show  increased duration, increased effect, greater accuracy, decreased procedural pain, increased response rate, and decreased cost with ultrasound guided versus blind injection.   Verbal informed consent obtained.  Time-out conducted.  Noted no overlying erythema, induration, or other signs of local infection.  Skin prepped in a sterile fashion.  Local anesthesia: Topical Ethyl chloride.  With sterile technique and under real time ultrasound guidance: With a 22-gauge 3-1/2 inch needle patient was injected under ultrasound guidance with 2 mL of 0.5% Marcaine and 1 mL of Kenalog 40 mg/dL. Completed without difficulty  Pain immediately resolved suggesting accurate placement of the medication.  Advised to call if fevers/chills, erythema, induration, drainage, or persistent bleeding.  Images permanently stored and available for review in the ultrasound unit.  Impression: Technically successful ultrasound guided injection.      Impression and Recommendations:     This case required medical decision making of moderate complexity.

## 2015-04-02 NOTE — Patient Instructions (Addendum)
Good to see you Increase gabapentin to 200mg   Baclofen up to 3 times a day tylenol 325 mg 3 times  A day no matter See me in 2 weeks and if not perfect we will try the piriformis.

## 2015-04-02 NOTE — [facility] Notes (Signed)
Pre visit review using our clinic review tool, if applicable. No additional management support is needed unless otherwise documented below in the visit note. 

## 2015-04-02 NOTE — Assessment & Plan Note (Signed)
Patient is given an injection today and tolerated the procedure very well. We discussed icing regimen and home exercises. We discussed topical anti-inflammatories. I do think that most of this pain is likely secondary to more of a radicular symptoms. Patient will increase her gabapentin to 200 g at night. If patient continues to have difficulty we may want to consider an injection in the piriformis muscle to see if this will be beneficial in patient will bring someone else to drive her if necessary. Otherwise at this is patient's lumbar radiculopathy I would consider a facet injection but patient wants to avoid this as long as possible. Patient declined formal physical therapy and patient come back in 2-3 weeks for further evaluation and treatment.

## 2015-04-06 ENCOUNTER — Ambulatory Visit: Payer: Medicare Other | Admitting: Neurology

## 2015-04-08 ENCOUNTER — Telehealth: Payer: Self-pay | Admitting: Family Medicine

## 2015-04-08 ENCOUNTER — Ambulatory Visit (INDEPENDENT_AMBULATORY_CARE_PROVIDER_SITE_OTHER): Payer: Medicare Other

## 2015-04-08 DIAGNOSIS — R109 Unspecified abdominal pain: Secondary | ICD-10-CM | POA: Diagnosis not present

## 2015-04-08 DIAGNOSIS — M5416 Radiculopathy, lumbar region: Secondary | ICD-10-CM

## 2015-04-08 DIAGNOSIS — R3915 Urgency of urination: Secondary | ICD-10-CM | POA: Diagnosis not present

## 2015-04-08 LAB — BASIC METABOLIC PANEL
BUN: 36 mg/dL — AB (ref 6–23)
CALCIUM: 9.2 mg/dL (ref 8.4–10.5)
CO2: 21 meq/L (ref 19–32)
Chloride: 105 mEq/L (ref 96–112)
Creat: 1.19 mg/dL — ABNORMAL HIGH (ref 0.50–1.10)
GLUCOSE: 91 mg/dL (ref 70–99)
Potassium: 4.3 mEq/L (ref 3.5–5.3)
SODIUM: 140 meq/L (ref 135–145)

## 2015-04-08 NOTE — Telephone Encounter (Signed)
Would like to know if she has had an MRI on her hip.  States her hip is not any better.  Patient states she is hurting through her lower back.  She did go to her PCP and gave a urine specimen to make sure its not her kidneys.

## 2015-04-08 NOTE — Telephone Encounter (Signed)
She did have an MRI on her hip & lumbar in 2015.

## 2015-04-08 NOTE — Telephone Encounter (Signed)
No MRI is needed Would do epidural if still in pain at L4-L5 Otherwise PT, sorry that's all.

## 2015-04-08 NOTE — Telephone Encounter (Signed)
Spoke to pt, she would like to try PT. Referral entered.

## 2015-04-09 LAB — URINALYSIS, ROUTINE W REFLEX MICROSCOPIC
Bilirubin Urine: NEGATIVE
Glucose, UA: NEGATIVE mg/dL
Hgb urine dipstick: NEGATIVE
KETONES UR: NEGATIVE mg/dL
LEUKOCYTES UA: NEGATIVE
NITRITE: NEGATIVE
Protein, ur: NEGATIVE mg/dL
SPECIFIC GRAVITY, URINE: 1.03 (ref 1.005–1.030)
Urobilinogen, UA: 0.2 mg/dL (ref 0.0–1.0)
pH: 5.5 (ref 5.0–8.0)

## 2015-04-10 LAB — URINE CULTURE
COLONY COUNT: NO GROWTH
Organism ID, Bacteria: NO GROWTH

## 2015-04-11 ENCOUNTER — Encounter: Payer: Self-pay | Admitting: Internal Medicine

## 2015-04-11 NOTE — Progress Notes (Signed)
Patient ID: Alexandra Lindsey, female   DOB: Jul 10, 1941, 74 y.o.   MRN: [id number]  MEDICARE ANNUAL WELLNESS VISIT AND OV  Assessment:   1. Essential hypertension  - TSH  2. Hyperlipidemia  - Lipid panel  3. Prediabetes  - Hemoglobin A1c - Insulin, random  4. Vitamin D deficiency  - Vit D  25 hydroxy (rtn osteoporosis monitoring)  5. Gastroesophageal reflux disease   6. Parkinson's disease   7. Depression screen   8. At low risk for fall   9. Medication management  - CBC with Differential/Platelet - BASIC METABOLIC PANEL WITH GFR - Magnesium - Hepatic function panel  10. Routine general medical examination at a health care facility   11. Muscular weakness  - Myasthenia gravis panel 2  12. At moderate risk for fall   Plan:   During the course of the visit the patient was educated and counseled about appropriate screening and preventive services including:    Pneumococcal vaccine   Influenza vaccine  Td vaccine  Screening electrocardiogram  Bone densitometry screening  Colorectal cancer screening  Diabetes screening  Glaucoma screening  Nutrition counseling   Advanced directives: requested  Screening recommendations, referrals: Vaccinations:  Immunization History  Administered Date(s) Administered  . Influenza, High Dose Seasonal PF 09/08/2014  . Pneumococcal-Unspecified 11/27/2006  . Td 09/08/2013  . Zoster 06/27/2006  Prevnar vaccine unavailable Hep B vaccine not indicated  Nutrition assessed and recommended  Colonoscopy 2013 Recommended yearly ophthalmology/optometry visit for glaucoma screening and checkup Recommended yearly dental visit for hygiene and checkup Advanced directives - yes  Conditions/risks identified: BMI: Discussed weight loss, diet, and increase physical activity.  Increase physical activity: AHA recommends 150 minutes of physical activity a week.  Medications reviewed PreDiabetes is at goal, ACE/ARB  therapy: Not Indicated Urinary Incontinence is not an issue: discussed non pharmacology and pharmacology options.  Fall risk: moderate- discussed PT, home fall assessment, medications.   Subjective:    Alexandra Lindsey presents for [employer] Visit and OV.  Date of last medicare wellness visit was 01/26/2014.  This very nice 74 y.o. MWF presents also for 3 month follow up with Hypertension, Hyperlipidemia, Pre-Diabetes and Vitamin D Deficiency. Other problems include GERD controlled with diet & Ranitidine. Also patient has had Parkinson's Dz for a number of years and has remained relatively well controlled and symptom free and is followed closely now by Dr [name]. Patient does report a couple of falls this past year w/o significant injury.     Patient has hx/o mild labile elevated BP and has been followed/monitore expectently. Today's BP: 112/66 mmHg. She did have a negative heart cath in 2013.  Patient has had no complaints of any cardiac type chest pain, palpitations, dyspnea/orthopnea/PND, dizziness, claudication, or dependent edema.   Hyperlipidemia is not controlled with diet. Last Lipids were not at goal - Total Chol 211; HDL 67; LDL 127; Trig 83 on 01/04/2015.   Also, the patient has history of PreDiabetes and has had no symptoms of reactive hypoglycemia, diabetic polys, paresthesias or visual blurring.  Last A1c was 5.7% on 01/04/2015.    Other problems include GERD which is controlled with diet & Ranitidine. Further, the patient also has history of Vitamin D Deficiency of 23 in 2008 and supplements vitamin D without any suspected side-effects. Last vitamin D was 82 on 09/08/2014.  Names of Other Physician/Practitioners you currently use: 1. [facility] here for primary care 2. Dr Marica Otter, [place], eye doctor,  last visit 2015 3. Dr Anders Simmonds, [place] - dentist, last visit - 1 week ago  Patient Care Team: Unk Pinto, MD as PCP - General  (Internal Medicine) Teena Irani, MD as Consulting Physician (Gastroenterology) Troy Sine, MD as Consulting Physician (Cardiology) Lyndal Pulley, DO as Attending Physician (Family Medicine) Ludwig Clarks, DO as Consulting Physician (Neurology) Marybelle Killings, MD as Consulting Physician (Orthopedic Surgery)  Medication Review: Medication Sig  . baclofen (LIORESAL) 10 MG tablet Take 0.5 tablets (5 mg total) by mouth 3 (three) times daily.  . carbidopa-levodopa (SINEMET IR) 25-100 MG per tablet Take 1 tablet by mouth 6 (six) times daily.  . cholecalciferol (VITAMIN D) 1000 UNITS tablet Take 5,000 Units by mouth daily.  . cyanocobalamin 500 MCG tablet Take 500 mcg by mouth daily.  [name] gabapentin (NEURONTIN) 100 MG capsule Take 2 capsules (200 mg total) by mouth at bedtime.  . Magnesium Oxide 500 MG TABS Take 1 tablet by mouth daily.  . Omega-3 Fatty Acids (FISH OIL) 1000 MG CAPS Take 1 capsule by mouth daily.  . pramipexole (MIRAPEX) 1 MG tablet TAKE 1 TABLET BY MOUTH 3 TIMES DAILY  . ranitidine (ZANTAC) 150 MG tablet Take 150 mg by mouth daily as needed. For acid reflex   Current Problems (verified) Patient Active Problem List   Diagnosis Date Noted  . Greater trochanteric bursitis of left hip 02/17/2015  . Piriformis syndrome of left side 11/05/2014  . SI (sacroiliac) joint dysfunction 11/05/2014  . Vitamin D deficiency 09/06/2014  . Medication management 09/06/2014  . Hypertension   . GERD (gastroesophageal reflux disease)   . Prediabetes   . 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 Parkinson's disease   . 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 Hyperlipidemia 10/07/2012   Screening Tests Health Maintenance  Topic Date Due  . PNA vac Low Risk Adult (1 of 2 - PCV13) 11/28/2007  . INFLUENZA VACCINE  06/28/2015  . MAMMOGRAM  10/10/2015  . COLONOSCOPY  12/03/2016  . TETANUS/TDAP  09/09/2023  . DEXA SCAN  Completed  . ZOSTAVAX  Completed   Immunization History  Administered Date(s)  Administered  . Influenza, High Dose Seasonal PF 09/08/2014  . Pneumococcal-Unspecified 11/27/2006  . Td 09/08/2013  . Zoster 06/27/2006   Preventative care: Last colonoscopy: 2013  History reviewed: allergies, current medications, past family history, past medical history, past social history, past surgical history and problem list  Risk Factors: Tobacco History  Substance Use Topics  . Smoking status: Never Smoker   . Smokeless tobacco: Not on file  . Alcohol Use: No   She does not smoke.  Patient is not a former smoker. Are there smokers in your home (other than you)?  No Alcohol Current alcohol use: none  Caffeine Current caffeine use: denies use  Exercise Current exercise: housecleaning and no regular exercise  Nutrition/Diet Current diet: in general, a "healthy" diet    Cardiac risk factors: advanced age (older than 56 for men, 15 for women), dyslipidemia, hypertension and sedentary lifestyle.  Depression Screen (Note: if answer to either of the following is "Yes", a more complete depression screening is indicated)   Q1: Over the past two weeks, have you felt down, depressed or hopeless? No  Q2: Over the past two weeks, have you felt little interest or pleasure in doing things? No  Have you lost interest or pleasure in daily life? No  Do you often feel hopeless? No  Do you cry easily over simple problems? No  Activities of Daily Living In your present state of health, do you have any difficulty performing the following activities?:  Driving? No Managing money?  No Feeding yourself? No Getting from bed to chair? No Climbing a flight of stairs? No Preparing food and eating?: No Bathing or showering? No Getting dressed: No Getting to the toilet? No Using the toilet:No Moving around from place to place: No In the past year have you fallen or had a near fall?:Yes   Are you sexually active?  Yes  Do you have more than one partner?  No  Vision  Difficulties: No  Hearing Difficulties: No Do you often ask people to speak up or repeat themselves? No Do you experience ringing or noises in your ears? No Do you have difficulty understanding soft or whispered voices? Sometimes.  Cognition  Do you feel that you have a problem with memory?No  Do you often misplace items? No  Do you feel safe at home?  Yes  Advanced directives Does patient have a [place]? Yes Does patient have a Living Will? Yes  Past Medical History  Diagnosis Date  . Parkinson's disease 10-07-12  . 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 Never smoked tobacco   . Spinal stenosis   . Costochondritis   . Hyperlipidemia 10-07-12  . Hypertension   . GERD (gastroesophageal reflux disease)   . Prediabetes   . Parkinson's disease    Past Surgical History  Procedure Laterality Date  . Abdominal hysterectomy    . Hernia repair    . Rotator cuff repair Left 10-07-12  . Bunionectomy Bilateral 11-11-3  . Cardiac catheterization Bilateral 2013  . Left heart catheterization with coronary angiogram N/A 07/03/2012    Procedure: LEFT HEART CATHETERIZATION WITH CORONARY ANGIOGRAM;  Surgeon: Troy Sine, MD;  Location: [facility] CATH LAB;  Service: Cardiovascular;  Laterality: N/A;   ROS: Constitutional: Denies fever, chills, weight loss/gain, headaches, insomnia, fatigue, night sweats, and change in appetite. Eyes: Denies redness, blurred vision, diplopia, discharge, itchy, watery eyes.  ENT: Denies discharge, congestion, post nasal drip, epistaxis, sore throat, earache, hearing loss, dental pain, Tinnitus, Vertigo, Sinus pain, snoring.  Cardio: Denies chest pain, palpitations, irregular heartbeat, syncope, dyspnea, diaphoresis, orthopnea, PND, claudication, edema Respiratory: denies cough, dyspnea, DOE, pleurisy, hoarseness, laryngitis, wheezing.  Gastrointestinal: Denies dysphagia, heartburn, reflux, water brash, pain, cramps, nausea, vomiting,  bloating, diarrhea, constipation, hematemesis, melena, hematochezia, jaundice, hemorrhoids Genitourinary: Denies dysuria, frequency, urgency, nocturia, hesitancy, discharge, hematuria, flank pain Breast: Breast lumps, nipple discharge, bleeding.  Musculoskeletal: Denies arthralgia, myalgia, stiffness, Jt. Swelling, pain, limp, and strain/sprain. Denies falls. Skin: Denies puritis, rash, hives, warts, acne, eczema, changing in skin lesion Neuro: No weakness, tremor, incoordination, spasms, paresthesia, pain Psychiatric: Denies confusion, memory loss, sensory loss. Denies Depression. Endocrine: Denies change in weight, skin, hair change, nocturia, and paresthesia, diabetic polys, visual blurring, hyper / hypo glycemic episodes.  Heme/Lymph: No excessive bleeding, bruising, enlarged lymph nodes  Objective:     BP 112/66   Pulse 64  Temp97.1 F   Resp 16  Ht 5' 0.5"  Wt 141 lb 12.8 oz     BMI 27.23   General Appearance: Well nourished, alert, WD/WN, female and in no apparent distress. Eyes: PERRLA, EOMs, conjunctiva no swelling or erythema, normal fundi and vessels. Sinuses: No frontal/maxillary tenderness ENT/Mouth: EACs patent / TMs  nl. Nares clear without erythema, swelling, mucoid exudates. Oral hygiene is good. No erythema, swelling, or exudate. Tongue normal, non-obstructing. Tonsils not swollen or  erythematous. Hearing normal.  Neck: Supple, thyroid normal. No bruits, nodes or JVD. Respiratory: Respiratory effort normal.  BS equal and clear bilateral without rales, rhonci, wheezing or stridor. Cardio: Heart sounds are normal with regular rate and rhythm and no murmurs, rubs or gallops. Peripheral pulses are normal and equal bilaterally without edema. No aortic or femoral bruits. Chest: symmetric with normal excursions and percussion. Abdomen: Flat, soft  with nl bowel sounds. Nontender, no guarding, rebound, hernias, masses, or organomegaly.  Lymphatics: Non tender without  lymphadenopathy.  Musculoskeletal: Full ROM all peripheral extremities, joint stability, 5/5 strength, and normal gait & stride appears Nl today. No appreciable cogwheeling.  Skin: Warm and dry without rashes, lesions, cyanosis, clubbing or  ecchymosis.  Neuro: Cranial nerves intact, reflexes equal bilaterally. Normal muscle tone, no cerebellar symptoms. Sensation intact. Mild masked facies.  Pysch: Alert and oriented X 3, normal affect, Insight and Judgment appropriate.   Cognitive Testing  Alert? Yes  Normal Appearance?Yes  Oriented to person? Yes  Place? Yes   Time? Yes  Recall of three objects?  Yes  Can perform simple calculations? Yes  Displays appropriate judgment? Yes  Can read the correct time from a watch/clock?Yes  Medicare Attestation I have personally reviewed: The patient's medical and social history Their use of alcohol, tobacco or illicit drugs Their current medications and supplements The patient's functional ability including ADLs,fall risks, home safety risks, cognitive, and hearing and visual impairment Diet and physical activities Evidence for depression or mood disorders  The patient's weight, height, BMI, and visual acuity have been recorded in the chart.  I have made referrals, counseling, and provided education to the patient based on review of the above and I have provided the patient with a written personalized care plan for preventive services.  Over 40 minutes of exam, counseling, chart review was performed.  Kianni Lheureux DAVID, MD   04/12/2015

## 2015-04-11 NOTE — Patient Instructions (Signed)
+++++++++++++++++++  Recommend Low dose or baby Aspirin 81 mg daily   To reduce risk of Colon Cancer 20 %, Skin Cancer 26 % , Melanoma 46% and   Pancreatic cancer 60%  ++++++++++++++++++++ Vitamin D goal is between 70-100.   Please make sure that you are taking your Vitamin D as directed.   It is very important as a natural antiinflammatory   helping hair, skin, and nails, as well as reducing stroke and heart attack risk.   It helps your bones and helps with mood.  It also decreases numerous cancer risks so please take it as directed.   Low Vit D is associated with a 200-300% higher risk for CANCER   and 200-300% higher risk for HEART   ATTACK  &  STROKE.    ................................................  It is also associated with higher death rate at younger ages,   autoimmune diseases like Rheumatoid arthritis, Lupus, Multiple Sclerosis.     Also many other serious conditions, like depression, Alzheimer's  Dementia, infertility, muscle aches, fatigue, fibromyalgia - just to name a few.  +++++++++++++++ Recommend the book "The END of DIETING" by Dr Joel Fuhrman   & the book "The END of DIABETES " by Dr Joel Fuhrman  At [employer] - get book & Audio CD's     Being diabetic has a  300% increased risk for heart attack, stroke, cancer, and alzheimer- type vascular dementia. It is very important that you work harder with diet by avoiding all foods that are white. Avoid white rice (brown & wild rice is OK), white potatoes (sweetpotatoes in moderation is OK), White bread or wheat bread or anything made out of white flour like bagels, donuts, rolls, buns, biscuits, cakes, pastries, cookies, pizza crust, and pasta (made from white flour & egg whites) - vegetarian pasta or spinach or wheat pasta is OK. Multigrain breads like Arnold's or [employer], or multigrain sandwich thins or flatbreads.  Diet, exercise and weight loss can reverse and cure diabetes in the early stages.   Diet, exercise and weight loss is very important in the control and prevention of complications of diabetes which affects every system in your body, ie. Brain - dementia/stroke, eyes - glaucoma/blindness, heart - heart attack/heart failure, kidneys - dialysis, stomach - gastric paralysis, intestines - malabsorption, nerves - severe painful neuritis, circulation - gangrene & loss of a leg(s), and finally cancer and Alzheimers.    I recommend avoid fried & greasy foods,  sweets/candy, white rice (brown or wild rice or Quinoa is OK), white potatoes (sweet potatoes are OK) - anything made from white flour - bagels, doughnuts, rolls, buns, biscuits,white and wheat breads, pizza crust and traditional pasta made of white flour & egg white(vegetarian pasta or spinach or wheat pasta is OK).  Multi-grain bread is OK - like multi-grain flat bread or sandwich thins. Avoid alcohol in excess. Exercise is also important.    Eat all the vegetables you want - avoid meat, especially red meat and dairy - especially cheese.  Cheese is the most concentrated form of trans-fats which is the worst thing to clog up our arteries. Veggie cheese is OK which can be found in the fresh produce section at [employer] or [employer] or [employer]  ++++++++++++++++++++++++++++++++++ Preventive Care for Adults  A healthy lifestyle and preventive care can promote health and wellness. Preventive health guidelines for women include the following key practices.  A routine yearly physical is a good way to check with your health care provider about   your health and preventive screening. It is a chance to share any concerns and updates on your health and to receive a thorough exam.  Visit your dentist for a routine exam and preventive care every 6 months. Brush your teeth twice a day and floss once a day. Good oral hygiene prevents tooth decay and gum disease.  The frequency of eye exams is based on your age, health, family medical history,  use of contact lenses, and other factors. Follow your health care provider's recommendations for frequency of eye exams.  Eat a healthy diet. Foods like vegetables, fruits, whole grains, low-fat dairy products, and lean protein foods contain the nutrients you need without too many calories. Decrease your intake of foods high in solid fats, added sugars, and salt. Eat the right amount of calories for you.Get information about a proper diet from your health care provider, if necessary.  Regular physical exercise is one of the most important things you can do for your health. Most adults should get at least 150 minutes of moderate-intensity exercise (any activity that increases your heart rate and causes you to sweat) each week. In addition, most adults need muscle-strengthening exercises on 2 or more days a week.  Maintain a healthy weight. The body mass index (BMI) is a screening tool to identify possible weight problems. It provides an estimate of body fat based on height and weight. Your health care provider can find your BMI and can help you achieve or maintain a healthy weight.For adults 20 years and older:  A BMI below 18.5 is considered underweight.  A BMI of 18.5 to 24.9 is normal.  A BMI of 25 to 29.9 is considered overweight.  A BMI of 30 and above is considered obese.  Maintain normal blood lipids and cholesterol levels by exercising and minimizing your intake of saturated fat. Eat a balanced diet with plenty of fruit and vegetables. If your lipid or cholesterol levels are high, you are over 50, or you are at high risk for heart disease, you may need your cholesterol levels checked more frequently.Ongoing high lipid and cholesterol levels should be treated with medicines if diet and exercise are not working.  If you smoke, find out from your health care provider how to quit. If you do not use tobacco, do not start.  Lung cancer screening is recommended for adults aged 55-80 years who  are at high risk for developing lung cancer because of a history of smoking. A yearly low-dose CT scan of the lungs is recommended for people who have at least a 30-pack-year history of smoking and are a current smoker or have quit within the past 15 years. A pack year of smoking is smoking an average of 1 pack of cigarettes a day for 1 year (for example: 1 pack a day for 30 years or 2 packs a day for 15 years). Yearly screening should continue until the smoker has stopped smoking for at least 15 years. Yearly screening should be stopped for people who develop a health problem that would prevent them from having lung cancer treatment.  Avoid use of street drugs. Do not share needles with anyone. Ask for help if you need support or instructions about stopping the use of drugs.  High blood pressure causes heart disease and increases the risk of stroke.  Ongoing high blood pressure should be treated with medicines if weight loss and exercise do not work.  If you are 55-79 years old, ask your health care provider if   you should take aspirin to prevent strokes.  Diabetes screening involves taking a blood sample to check your fasting blood sugar level. This should be done once every 3 years, after age 45, if you are within normal weight and without risk factors for diabetes. Testing should be considered at a younger age or be carried out more frequently if you are overweight and have at least 1 risk factor for diabetes.  Breast cancer screening is essential preventive care for women. You should practice "breast self-awareness." This means understanding the normal appearance and feel of your breasts and may include breast self-examination. Any changes detected, no matter how small, should be reported to a health care provider. Women in their 20s and 30s should have a clinical breast exam (CBE) by a health care provider as part of a regular health exam every 1 to 3 years. After age 40, women should have a CBE every  year. Starting at age 40, women should consider having a mammogram (breast X-ray test) every year. Women who have a family history of breast cancer should talk to their health care provider about genetic screening. Women at a high risk of breast cancer should talk to their health care providers about having an MRI and a mammogram every year.  Breast cancer gene (BRCA)-related cancer risk assessment is recommended for women who have family members with BRCA-related cancers. BRCA-related cancers include breast, ovarian, tubal, and peritoneal cancers. Having family members with these cancers may be associated with an increased risk for harmful changes (mutations) in the breast cancer genes BRCA1 and BRCA2. Results of the assessment will determine the need for genetic counseling and BRCA1 and BRCA2 testing.  Routine pelvic exams to screen for cancer are no longer recommended for nonpregnant women who are considered low risk for cancer of the pelvic organs (ovaries, uterus, and vagina) and who do not have symptoms. Ask your health care provider if a screening pelvic exam is right for you.  If you have had past treatment for cervical cancer or a condition that could lead to cancer, you need Pap tests and screening for cancer for at least 20 years after your treatment. If Pap tests have been discontinued, your risk factors (such as having a new sexual partner) need to be reassessed to determine if screening should be resumed. Some women have medical problems that increase the chance of getting cervical cancer. In these cases, your health care provider may recommend more frequent screening and Pap tests.    Colorectal cancer can be detected and often prevented. Most routine colorectal cancer screening begins at the age of 50 years and continues through age 75 years. However, your health care provider may recommend screening at an earlier age if you have risk factors for colon cancer. On a yearly basis, your health  care provider may provide home test kits to check for hidden blood in the stool. Use of a small camera at the end of a tube, to directly examine the colon (sigmoidoscopy or colonoscopy), can detect the earliest forms of colorectal cancer. Talk to your health care provider about this at age 50, when routine screening begins. Direct exam of the colon should be repeated every 5-10 years through age 75 years, unless early forms of pre-cancerous polyps or small growths are found.  Osteoporosis is a disease in which the bones lose minerals and strength with aging. This can result in serious bone fractures or breaks. The risk of osteoporosis can be identified using a bone density scan.   Women ages 65 years and over and women at risk for fractures or osteoporosis should discuss screening with their health care providers. Ask your health care provider whether you should take a calcium supplement or vitamin D to reduce the rate of osteoporosis.  Menopause can be associated with physical symptoms and risks. Hormone replacement therapy is available to decrease symptoms and risks. You should talk to your health care provider about whether hormone replacement therapy is right for you.  Use sunscreen. Apply sunscreen liberally and repeatedly throughout the day. You should seek shade when your shadow is shorter than you. Protect yourself by wearing long sleeves, pants, a wide-brimmed hat, and sunglasses year round, whenever you are outdoors.  Once a month, do a whole body skin exam, using a mirror to look at the skin on your back. Tell your health care provider of new moles, moles that have irregular borders, moles that are larger than a pencil eraser, or moles that have changed in shape or color.  Stay current with required vaccines (immunizations).  Influenza vaccine. All adults should be immunized every year.  Tetanus, diphtheria, and acellular pertussis (Td, Tdap) vaccine. Pregnant women should receive 1 dose of  Tdap vaccine during each pregnancy. The dose should be obtained regardless of the length of time since the last dose. Immunization is preferred during the 27th-36th week of gestation. An adult who has not previously received Tdap or who does not know her vaccine status should receive 1 dose of Tdap. This initial dose should be followed by tetanus and diphtheria toxoids (Td) booster doses every 10 years. Adults with an unknown or incomplete history of completing a 3-dose immunization series with Td-containing vaccines should begin or complete a primary immunization series including a Tdap dose. Adults should receive a Td booster every 10 years.    Zoster vaccine. One dose is recommended for adults aged 60 years or older unless certain conditions are present.    Pneumococcal 13-valent conjugate (PCV13) vaccine. When indicated, a person who is uncertain of her immunization history and has no record of immunization should receive the PCV13 vaccine. An adult aged 19 years or older who has certain medical conditions and has not been previously immunized should receive 1 dose of PCV13 vaccine. This PCV13 should be followed with a dose of pneumococcal polysaccharide (PPSV23) vaccine. The PPSV23 vaccine dose should be obtained at least 8 weeks after the dose of PCV13 vaccine. An adult aged 19 years or older who has certain medical conditions and previously received 1 or more doses of PPSV23 vaccine should receive 1 dose of PCV13. The PCV13 vaccine dose should be obtained 1 or more years after the last PPSV23 vaccine dose.    Pneumococcal polysaccharide (PPSV23) vaccine. When PCV13 is also indicated, PCV13 should be obtained first. All adults aged 65 years and older should be immunized. An adult younger than age 65 years who has certain medical conditions should be immunized. Any person who resides in a nursing home or long-term care facility should be immunized. An adult smoker should be immunized. People with an  immunocompromised condition and certain other conditions should receive both PCV13 and PPSV23 vaccines. People with human immunodeficiency virus (HIV) infection should be immunized as soon as possible after diagnosis. Immunization during chemotherapy or radiation therapy should be avoided. Routine use of PPSV23 vaccine is not recommended for American Indians, [place] Natives, or people younger than 65 years unless there are medical conditions that require PPSV23 vaccine. When indicated, people who have unknown   immunization and have no record of immunization should receive PPSV23 vaccine. One-time revaccination 5 years after the first dose of PPSV23 is recommended for people aged 19-64 years who have chronic kidney failure, nephrotic syndrome, asplenia, or immunocompromised conditions. People who received 1-2 doses of PPSV23 before age 65 years should receive another dose of PPSV23 vaccine at age 65 years or later if at least 5 years have passed since the previous dose. Doses of PPSV23 are not needed for people immunized with PPSV23 at or after age 65 years.   Preventive Services / Frequency  Ages 65 years and over  Blood pressure check.  Lipid and cholesterol check.  Lung cancer screening. / Every year if you are aged 55-80 years and have a 30-pack-year history of smoking and currently smoke or have quit within the past 15 years. Yearly screening is stopped once you have quit smoking for at least 15 years or develop a health problem that would prevent you from having lung cancer treatment.  Clinical breast exam.** / Every year after age 40 years.  BRCA-related cancer risk assessment.** / For women who have family members with a BRCA-related cancer (breast, ovarian, tubal, or peritoneal cancers).  Mammogram.** / Every year beginning at age 40 years and continuing for as long as you are in good health. Consult with your health care provider.  Pap test.** / Every 3 years starting at age 30 years  through age 65 or 70 years with 3 consecutive normal Pap tests. Testing can be stopped between 65 and 70 years with 3 consecutive normal Pap tests and no abnormal Pap or HPV tests in the past 10 years.  Fecal occult blood test (FOBT) of stool. / Every year beginning at age 50 years and continuing until age 75 years. You may not need to do this test if you get a colonoscopy every 10 years.  Flexible sigmoidoscopy or colonoscopy.** / Every 5 years for a flexible sigmoidoscopy or every 10 years for a colonoscopy beginning at age 50 years and continuing until age 75 years.  Hepatitis C blood test.** / For all people born from 1945 through 1965 and any individual with known risks for hepatitis C.  Osteoporosis screening.** / A one-time screening for women ages 65 years and over and women at risk for fractures or osteoporosis.  Skin self-exam. / Monthly.  Influenza vaccine. / Every year.  Tetanus, diphtheria, and acellular pertussis (Tdap/Td) vaccine.** / 1 dose of Td every 10 years.  Zoster vaccine.** / 1 dose for adults aged 60 years or older.  Pneumococcal 13-valent conjugate (PCV13) vaccine.** / Consult your health care provider.  Pneumococcal polysaccharide (PPSV23) vaccine.** / 1 dose for all adults aged 65 years and older. Screening for abdominal aortic aneurysm (AAA)  by ultrasound is recommended for people who have history of high blood pressure or who are current or former smokers. 

## 2015-04-12 ENCOUNTER — Ambulatory Visit (INDEPENDENT_AMBULATORY_CARE_PROVIDER_SITE_OTHER): Payer: Medicare Other | Admitting: Internal Medicine

## 2015-04-12 ENCOUNTER — Encounter: Payer: Self-pay | Admitting: Internal Medicine

## 2015-04-12 VITALS — BP 112/66 | HR 64 | Temp 97.1°F | Resp 16 | Ht 60.5 in | Wt 141.8 lb

## 2015-04-12 DIAGNOSIS — I10 Essential (primary) hypertension: Secondary | ICD-10-CM

## 2015-04-12 DIAGNOSIS — R296 Repeated falls: Secondary | ICD-10-CM | POA: Diagnosis not present

## 2015-04-12 DIAGNOSIS — Z79899 Other long term (current) drug therapy: Secondary | ICD-10-CM

## 2015-04-12 DIAGNOSIS — E785 Hyperlipidemia, unspecified: Secondary | ICD-10-CM | POA: Diagnosis not present

## 2015-04-12 DIAGNOSIS — R7303 Prediabetes: Secondary | ICD-10-CM

## 2015-04-12 DIAGNOSIS — R6889 Other general symptoms and signs: Secondary | ICD-10-CM | POA: Diagnosis not present

## 2015-04-12 DIAGNOSIS — E559 Vitamin D deficiency, unspecified: Secondary | ICD-10-CM | POA: Diagnosis not present

## 2015-04-12 DIAGNOSIS — K219 Gastro-esophageal reflux disease without esophagitis: Secondary | ICD-10-CM

## 2015-04-12 DIAGNOSIS — Z1331 Encounter for screening for depression: Secondary | ICD-10-CM

## 2015-04-12 DIAGNOSIS — Z0001 Encounter for general adult medical examination with abnormal findings: Secondary | ICD-10-CM

## 2015-04-12 DIAGNOSIS — G20 Parkinson's disease: Secondary | ICD-10-CM

## 2015-04-12 DIAGNOSIS — G20A1 Parkinson's disease without dyskinesia, without mention of fluctuations: Secondary | ICD-10-CM

## 2015-04-12 DIAGNOSIS — Z9181 History of falling: Secondary | ICD-10-CM

## 2015-04-12 DIAGNOSIS — M6281 Muscle weakness (generalized): Secondary | ICD-10-CM | POA: Diagnosis not present

## 2015-04-12 DIAGNOSIS — Z0000 Encounter for general adult medical examination without abnormal findings: Secondary | ICD-10-CM

## 2015-04-12 DIAGNOSIS — R7309 Other abnormal glucose: Secondary | ICD-10-CM | POA: Diagnosis not present

## 2015-04-12 LAB — LIPID PANEL
Cholesterol: 196 mg/dL (ref 0–200)
HDL: 53 mg/dL (ref >=46)
LDL CALC: 124 mg/dL — AB (ref 0–99)
TRIGLYCERIDES: 94 mg/dL (ref <150)
Total CHOL/HDL Ratio: 3.7 Ratio
VLDL: 19 mg/dL (ref 0–40)

## 2015-04-12 LAB — CBC WITH DIFFERENTIAL/PLATELET
BASOS ABS: 0.1 10*3/uL (ref 0.0–0.1)
BASOS PCT: 1 % (ref 0–1)
Eosinophils Absolute: 0.1 10*3/uL (ref 0.0–0.7)
Eosinophils Relative: 2 % (ref 0–5)
HCT: 37.5 % (ref 36.0–46.0)
Hemoglobin: 12.5 g/dL (ref 12.0–15.0)
Lymphocytes Relative: 26 % (ref 12–46)
Lymphs Abs: 1.8 10*3/uL (ref 0.7–4.0)
MCH: 30.9 pg (ref 26.0–34.0)
MCHC: 33.3 g/dL (ref 30.0–36.0)
MCV: 92.8 fL (ref 78.0–100.0)
MONO ABS: 0.6 10*3/uL (ref 0.1–1.0)
MPV: 10.4 fL (ref 8.6–12.4)
Monocytes Relative: 8 % (ref 3–12)
NEUTROS ABS: 4.5 10*3/uL (ref 1.7–7.7)
Neutrophils Relative %: 63 % (ref 43–77)
PLATELETS: 274 10*3/uL (ref 150–400)
RBC: 4.04 MIL/uL (ref 3.87–5.11)
RDW: 12.9 % (ref 11.5–15.5)
WBC: 7.1 10*3/uL (ref 4.0–10.5)

## 2015-04-12 LAB — BASIC METABOLIC PANEL WITH GFR
BUN: 21 mg/dL (ref 6–23)
CALCIUM: 9.3 mg/dL (ref 8.4–10.5)
CO2: 26 mEq/L (ref 19–32)
CREATININE: 0.92 mg/dL (ref 0.50–1.10)
Chloride: 102 mEq/L (ref 96–112)
GFR, EST AFRICAN AMERICAN: 72 mL/min
GFR, Est Non African American: 62 mL/min
GLUCOSE: 91 mg/dL (ref 70–99)
Potassium: 4.4 mEq/L (ref 3.5–5.3)
SODIUM: 141 meq/L (ref 135–145)

## 2015-04-12 LAB — HEPATIC FUNCTION PANEL
ALK PHOS: 64 U/L (ref 39–117)
AST: 12 U/L (ref 0–37)
Albumin: 3.9 g/dL (ref 3.5–5.2)
BILIRUBIN INDIRECT: 0.4 mg/dL (ref 0.2–1.2)
Bilirubin, Direct: 0.1 mg/dL (ref 0.0–0.3)
Total Bilirubin: 0.5 mg/dL (ref 0.2–1.2)
Total Protein: 6.2 g/dL (ref 6.0–8.3)

## 2015-04-12 LAB — MAGNESIUM: Magnesium: 2.1 mg/dL (ref 1.5–2.5)

## 2015-04-12 LAB — HEMOGLOBIN A1C
HEMOGLOBIN A1C: 5.8 % — AB (ref <5.7)
MEAN PLASMA GLUCOSE: 120 mg/dL — AB (ref <117)

## 2015-04-12 LAB — TSH: TSH: 0.849 u[IU]/mL (ref 0.350–4.500)

## 2015-04-13 LAB — VITAMIN D 25 HYDROXY (VIT D DEFICIENCY, FRACTURES): Vit D, 25-Hydroxy: 54 ng/mL (ref 30–100)

## 2015-04-13 LAB — INSULIN, RANDOM: INSULIN: 8.9 u[IU]/mL (ref 2.0–19.6)

## 2015-04-13 IMAGING — CR DG CHEST 2V
1 series · 1 of 1 positions shown · non-contrast
Comparison: None.

CLINICAL DATA: Left-sided chest pain and weakness

CHEST - 2 VIEW

[w chest lat]
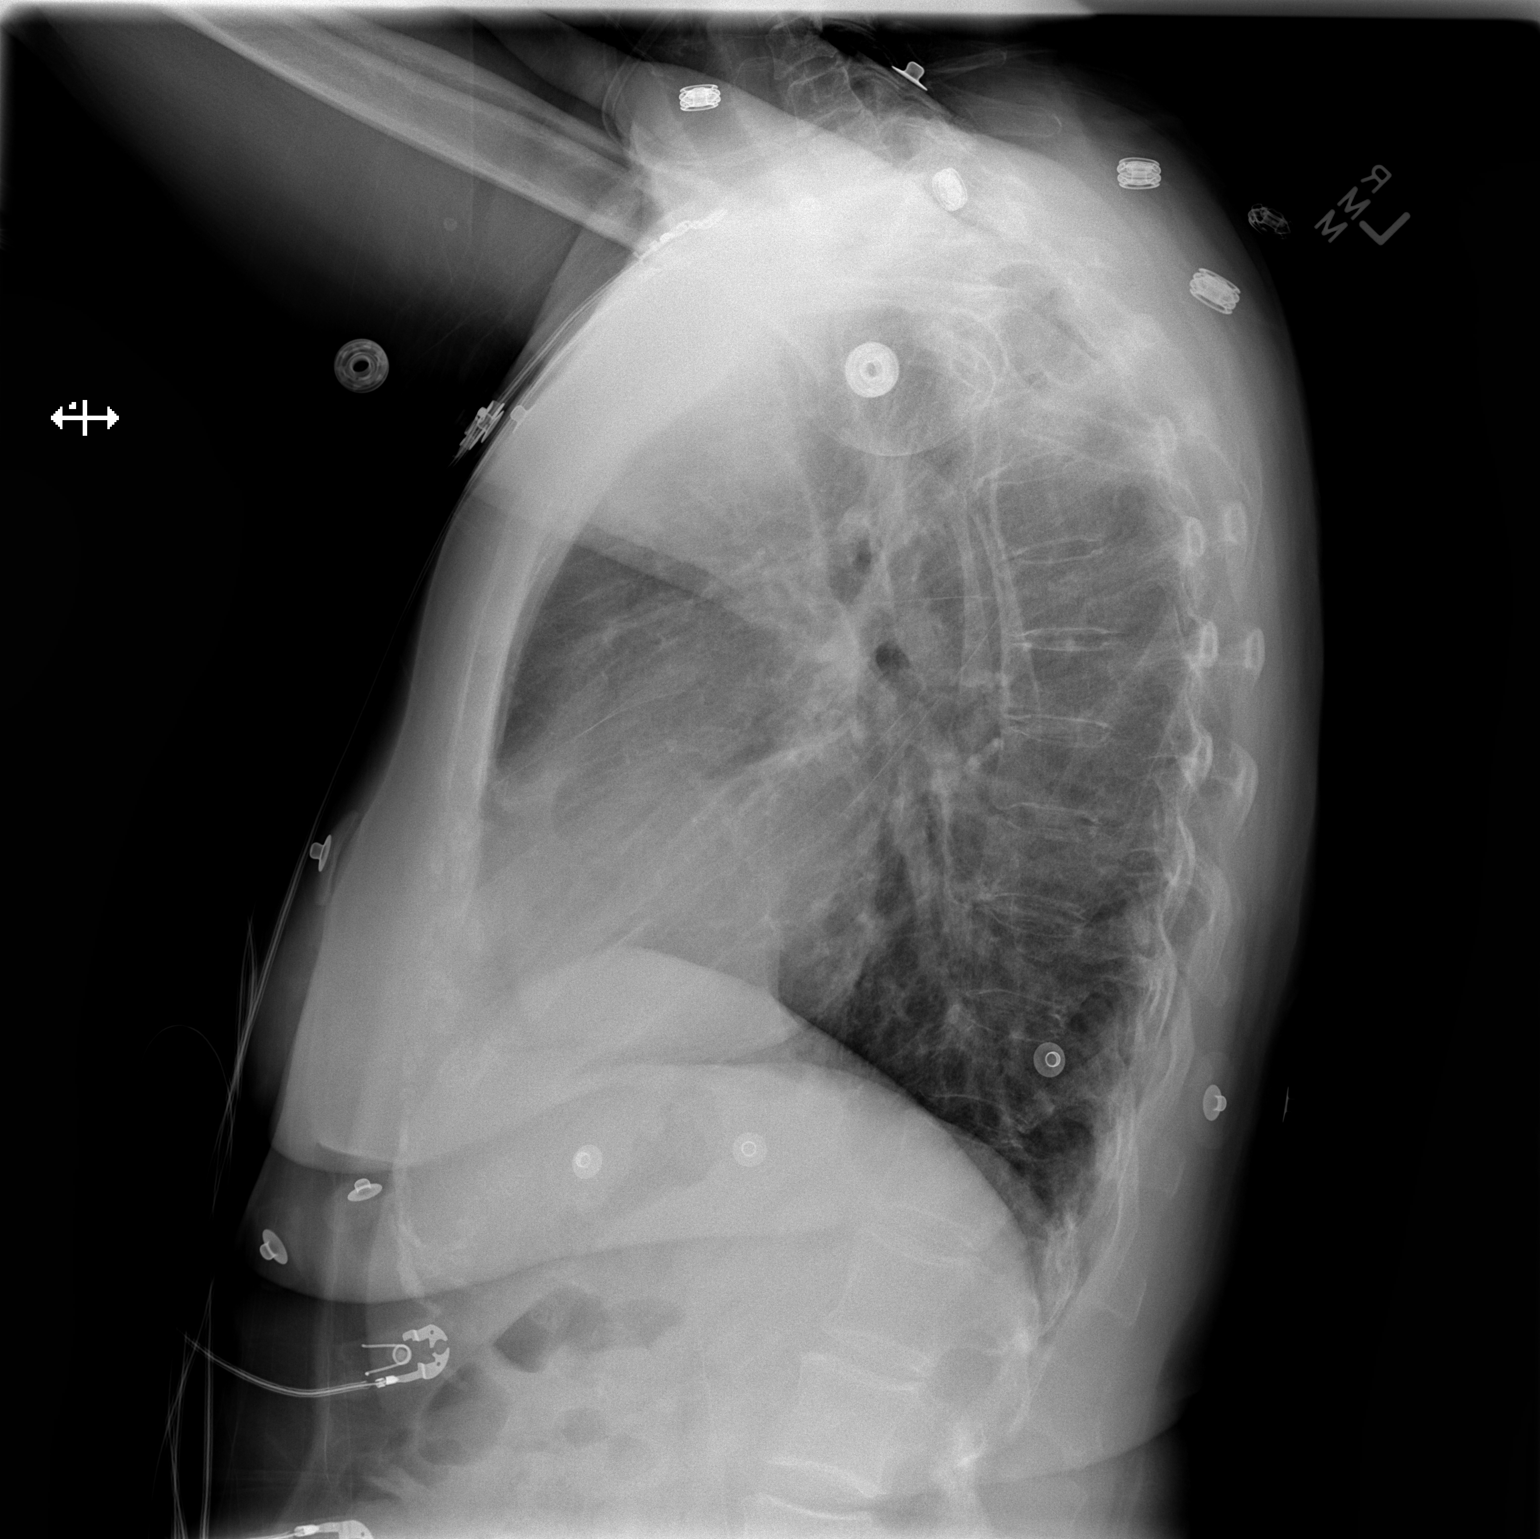

[1 of 1 positions shown; findings below may reference images not displayed]

FINDINGS: Borderline enlarged cardiac silhouette and mediastinal
contours.  There is mild elevation/eventration of the right
hemidiaphragm.  There is mild diffuse thickening of the pulmonary
interstitium.  No definite evidence of edema.  No pleural effusion
or pneumothorax.  No acute osseous abnormalities.
IMPRESSION: Mild cardiomegaly and bronchitic change without acute
cardiopulmonary disease.

## 2015-04-20 ENCOUNTER — Ambulatory Visit (INDEPENDENT_AMBULATORY_CARE_PROVIDER_SITE_OTHER): Payer: Medicare Other | Admitting: Family Medicine

## 2015-04-20 ENCOUNTER — Encounter: Payer: Self-pay | Admitting: Family Medicine

## 2015-04-20 VITALS — BP 124/82 | HR 72 | Wt 143.0 lb

## 2015-04-20 DIAGNOSIS — M533 Sacrococcygeal disorders, not elsewhere classified: Secondary | ICD-10-CM

## 2015-04-20 NOTE — Assessment & Plan Note (Signed)
Discussed with patient at great length. Patient did respond well to a sacroiliac joint injection again. Patient is doing relatively well at this time and we'll continue to monitor symptoms. Patient has muscle relaxers needed. Patient will do more of a manual massage that I think with beneficial and isolate more of the piriformis muscle. If any worsening symptoms she would come back and I would consider an injection in the piriformis. We discussed this in entirety. Patient has not had any more falls and we also discussed different balance and coordination skills that could be beneficial. Patient and will follow-up with me again in 4-6 weeks for further evaluation and treatment.  Spent  25 minutes with patient face-to-face and had greater than 50% of counseling including as described above in assessment and plan.

## 2015-04-20 NOTE — Patient Instructions (Signed)
Good to see you Stretch after clogging.  Ice is your friend When sitting long time try tennis ball and or soap Stay active See me again in 3 weeks or if pain worsens and would consider piriformis injection but please bring driver.

## 2015-04-20 NOTE — [facility] Notes (Signed)
Pre visit review using our clinic review tool, if applicable. No additional management support is needed unless otherwise documented below in the visit note. 

## 2015-04-20 NOTE — Progress Notes (Signed)
[facility] [address] [place], [place] [address] Phone: [phone] Subjective:     CC: Back pain and hip pain follow up  Alexandra Lindsey is a 74 y.o. female coming in with complaint of back pain and hip pain.    Pertinent history: Patient has been followed by neurology for Parkinson's disease. Patient does have a known history of spinal stenosis. Patient did have a further workup recently with MRI of the lumbar spine and hip. These were reviewed by me today. Patient's MRI of the lumbar spine on 10/23/2014 showed spondylosis with degenerative disc disease with moderate impingement at L4-L5 and mild impingement at L5-S1.     Patient for her back has had epidural steroid injections in December 2014 as well as April 2015. Patient states the epidural steroid injection did not seem to be beneficial.   She has seen me previously and had a sacroiliac joint dysfunction. Patient continued to have difficulty and was given an injection in the sacroiliac joint. Patient also was sent for physical therapy. Patient was to do home exercises in the icing protocol. Patient states after the injection she did not have any significant improvement until the last 3 or 4 days. Patient states since then she is doing much better. No significant radicular symptoms. Still has tightening in the left buttocks from time to time. Patient though states that she can do all daily activities and is sleeping comfortably. Patient states that the worst still seems to be sitting for a longer duration.  Past medical history, social, surgical and family history all reviewed in electronic medical record.   Review of Systems: No headache, visual changes, nausea, vomiting, diarrhea, constipation, dizziness, abdominal pain, skin rash, fevers, chills, night sweats, weight loss, swollen lymph nodes, body aches, joint swelling, muscle aches, chest pain, shortness of breath, mood changes.    Objective Blood pressure 124/82, pulse 72, weight 143 lb (64.864 kg), SpO2 98 %.  General: No apparent distress alert and oriented x3 mood and affect normal, dressed appropriately.  HEENT: Pupils equal, extraocular movements intact  Respiratory: Patient's speak in full sentences and does not appear short of breath  Cardiovascular: No lower extremity edema, non tender, no erythema  Skin: Warm dry intact with no signs of infection or rash on extremities or on axial skeleton.  Abdomen: Soft nontender  Neuro: Cranial nerves II through XII are intact, neurovascularly intact in all extremities with 2+ DTRs and 2+ pulses.  Lymph: No lymphadenopathy of posterior or anterior cervical chain or axillae bilaterally.  Gait normal with good balance and coordination.  MSK:  Non tender with full range of motion and good stability and symmetric strength and tone of shoulders, elbows, wrist, hip, knee and ankles bilaterally.  Back Exam:  Inspection: Mild increase in kyphosis Motion: Flexion 35 deg, Extension 25 deg, Side Bending to 25 deg bilaterally,  Rotation to 25 deg bilaterally  SLR laying: Negative  XSLR laying: Negative  Palpable tenderness: Pain over the piriformis muscle noted on the left side. Still tenderness over the left sacroiliac joint. FABER: Mildly positive left side with some mild improvement Sensory change: Gross sensation intact to all lumbar and sacral dermatomes.  Reflexes: 2+ at both patellar tendons, 2+ at achilles tendons, Babinski's downgoing.  Strength at foot  Plantar-flexion: 5/5 Dorsi-flexion: 5/5 Eversion: 5/5 Inversion: 5/5  Leg strength  Quad: 5/5 Hamstring: 5/5 Hip flexor: 4/5 Hip abductors: 4/5  Gait unremarkable. Hip: ROM IR: 15 Deg, ER: 35 Deg,  Flexion: 100 Deg, Extension: 100 Deg, Abduction: 45 Deg, Adduction: 45 Deg Strength IR: 4/5, ER: 5/5, Flexion: 5/5, Extension: 5/5, Abduction: 4/5, Adduction: 4/5 Pelvic alignment unremarkable to inspection and  palpation. Standing hip rotation and gait without trendelenburg sign / unsteadiness.        Impression and Recommendations:     This case required medical decision making of moderate complexity.

## 2015-04-23 LAB — MYASTHENIA GRAVIS PANEL 2
Acetylcholine Rec Mod Ab: 20 % binding inhibition
Aceytlcholine Rec Bloc Ab: <15 % of inhibition (ref <15)

## 2015-05-04 ENCOUNTER — Other Ambulatory Visit: Payer: Self-pay | Admitting: *Deleted

## 2015-05-04 MED ORDER — BACLOFEN 10 MG PO TABS: 5.0000 mg | ORAL_TABLET | Freq: Three times a day (TID) | ORAL | Status: DC

## 2015-05-04 NOTE — Telephone Encounter (Signed)
Refill done.  

## 2015-05-11 ENCOUNTER — Ambulatory Visit: Payer: Medicare Other | Admitting: Neurology

## 2015-05-11 ENCOUNTER — Ambulatory Visit: Payer: Medicare Other | Admitting: Family Medicine

## 2015-05-24 ENCOUNTER — Other Ambulatory Visit: Payer: Self-pay

## 2015-05-25 ENCOUNTER — Encounter: Payer: Self-pay | Admitting: Neurology

## 2015-05-25 ENCOUNTER — Ambulatory Visit (INDEPENDENT_AMBULATORY_CARE_PROVIDER_SITE_OTHER): Payer: Medicare Other | Admitting: Neurology

## 2015-05-25 VITALS — BP 104/68 | HR 88 | Ht 60.0 in | Wt 144.0 lb

## 2015-05-25 DIAGNOSIS — G20 Parkinson's disease: Secondary | ICD-10-CM | POA: Diagnosis not present

## 2015-05-25 NOTE — Progress Notes (Signed)
Alexandra Lindsey was seen today in the movement disorders clinic for neurologic consultation at the request of MCKEOWN,WILLIAM DAVID, MD.  The patient has previously seen both Dr. Erling Cruz and Dr. Rexene Alberts.  The records that were available to me were reviewed.  The consultation is for the evaluation and treatment of Parkinson's disease.  The patient has had a diagnosis of Parkinson's disease since at least 2002.  The first symptom was inability to get out of a chair and dragging the L leg and shuffling.  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    The patient was placed on Mirapex in 2002 and remains on that medication.  Once she was started on that medication she could immediately clog again.  She is currently on 1 mg 3 times a day.  She was placed on Stalevo 3 or 4 years later and is currently on Stalevo 100 mg, one tablet at 6 AM, half a tablet at 9 AM, 1 tablet at noon, half a tablet at 3 PM and one tablet at 6 PM.  In addition, she takes a regular carbidopa/levodopa 25/100 in the AM and chews it.  If she doesn't do that, she shuffles.  She chews that pill and then 45 mins later starts the Stalevo regimen.  She does state that Stalevo has become quite expensive for her and her insurance is going to change and asks about alternatives.    10/06/14 update:  The patient presents today, accompanied by her husband to supplement the history.  Last visit, I discontinued the patient's Stalevo, primarily because of cost and changed her to cupboard up a/levodopa 25/100, and we decided to hold the entacapone component.  She chews one tablet in the morning followed by carbidopa/levodopa 25/100 one tablet at 6 AM, half a tablet at 9 AM, 1 tablet at noon, half a tablet at 3 PM and one tablet at 6 PM.  She admits that she did not even take her 3 PM dose today, and is not sure that she even needs it.  Overall, from a Parkinson standpoint she feels great.  She admits to  some dyskinesia in the lower legs, but states that that is not bothersome.  Her biggest issue continues to be low back pain and left hip pain.  She has been back to Dr. Neomia Dear but did not get much relief.  She subsequently saw Dr. Lorin Mercy at [facility].  She was started on 100 mg of Neurontin at bedtime, but that only helped her sleep.  She states that first thing in the morning she has such pain in the left hip and back that she can barely walk.  01/07/15 update:  Pt f/u today re: PD.  She is on carbidopa/levodopa 25/100.  She chews one tablet in the morning followed by carbidopa/levodopa 25/100 one tablet at 6 AM, half a tablet at 9 AM, 1 tablet at noon, half a tablet at 3 PM and one tablet at 6 PM.  She is also on mirapex 1 mg tid.  She came off of meds today so that I could see what she looks like off.  She feels very stiff.  She feels that she isnt moving well.  She did have one fall since last visit.  She fell off of the stool at her vanity.   She didn't get hurt.   She saw Dr. Tamala Julian since last visit.  I  reviewed those records.  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  She is doing much better in that regard.  03/29/15 update:  Pt states that she is not doing as well as she was because of the back/leg pain again.  She continues to see Dr. Tamala Julian and has had further injections.  This one was in the bursa and she states that this one didn't help.  She is not walking as well.   She states that her PD meds are wearing off before they are due for the next dosing.  She takes one full tablet of levodopa at 5 AM (chewed), one tablet at 8 AM, half a tablet at 11 AM, 1 tablet at 1 PM, half a tablet at 4 PM and one tablet at 6 PM.  She remains on pramipexole 1.0 mg 3 times per day.  She just does not feel like she has the energy that she use to.  She has very little dyskinesia.  She has not had any falls.  05/25/15 update:  Pt is f/u today. I increased her  carbidopa/levodopa 25/100 to one tablet 6 times a day and she is on pramipexole 1.0 mg tid.  I referred her for neurorehab but she declined and she did not want therapy.  She has been to see Dr Tamala Julian and she had a SI joint injection.  She states that she uses a bar of soap in sheets for leg cramps and started putting that on her hip.  She now lathers soap on her hip and thinks that is why her hip is virtually pain free.  She also states that she was having weak spells and her gabapentin was d/c and she is doing better.  She is trying to exercise some at home (leg lifts, sit ups, dancing/clogging).  She is overall feeling great and better than she has is over a year.  She is very pleased.  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 Macrobid [Nitrofurantoin Macrocrystal] Swelling and Rash  . Cephalosporins Other (See Comments)    Cannot sleep  . Codeine Nausea Only    dizziness  . Darvocet [Propoxyphene N-Acetaminophen] Nausea And Vomiting  . Levaquin [Levofloxacin] Hives  . Penicillins Rash  . Selegiline Hcl Rash  . Tramadol Nausea And Vomiting  . Triamcinolone Itching    CURRENT MEDICATIONS:  Outpatient Encounter Prescriptions as of 05/25/2015  Medication Sig  . acetaminophen (TYLENOL) 325 MG tablet Take 650 mg by mouth every 6 (six) hours as needed.  . baclofen (LIORESAL) 10 MG tablet Take 0.5 tablets (5 mg total) by mouth 3 (three) times daily. (Patient taking differently: Take 5 mg by mouth at bedtime. )  . carbidopa-levodopa (SINEMET IR) 25-100 MG per tablet Take 1 tablet by mouth 6 (six) times daily.  . cholecalciferol (VITAMIN D) 1000 UNITS tablet Take 5,000 Units by mouth daily.  . cyanocobalamin 500 MCG tablet Take 500 mcg by mouth daily.  . Magnesium Oxide 500 MG TABS Take 1 tablet by mouth daily.  . Omega-3 Fatty  Acids (FISH OIL) 1000 MG CAPS Take 1 capsule by mouth daily.  . pramipexole (MIRAPEX) 1 MG tablet TAKE 1 TABLET BY MOUTH 3 TIMES DAILY  . ranitidine (ZANTAC) 150 MG tablet Take 150 mg by mouth daily as needed. For acid reflex  . [DISCONTINUED]  gabapentin (NEURONTIN) 100 MG capsule Take 2 capsules (200 mg total) by mouth at bedtime.  . [DISCONTINUED] Turmeric 500 MG CAPS Take 1 capsule by mouth daily.   No facility-administered encounter medications on file as of 05/25/2015.    PAST MEDICAL HISTORY:   Past Medical History  Diagnosis Date  . Parkinson's disease 10-07-12  . 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 Never smoked tobacco   . Spinal stenosis   . Costochondritis   . Hyperlipidemia 10-07-12  . Hypertension   . GERD (gastroesophageal reflux disease)   . Prediabetes   . Parkinson's disease     PAST SURGICAL HISTORY:   Past Surgical History  Procedure Laterality Date  . Abdominal hysterectomy    . Hernia repair    . Rotator cuff repair Left 10-07-12  . Bunionectomy Bilateral 11-11-3  . Cardiac catheterization Bilateral 2013  . Left heart catheterization with coronary angiogram N/A 07/03/2012    Procedure: LEFT HEART CATHETERIZATION WITH CORONARY ANGIOGRAM;  Surgeon: Troy Sine, MD;  Location: [facility] CATH LAB;  Service: Cardiovascular;  Laterality: N/A;    SOCIAL HISTORY:   History   Social History  . Marital Status: Married    Spouse Name: N/A  . Number of Children: N/A  . Years of Education: N/A   Occupational History  . retired     Chiropractor   Social History Main Topics  . Smoking status: Never Smoker   . Smokeless tobacco: Not on file  . Alcohol Use: No  . Drug Use: No  . Sexual Activity: Not on file   Other Topics Concern  . Not on file   Social History Narrative    FAMILY HISTORY:   Family Status  Relation Status Death Age  . Mother Deceased     esophageal cancer, heart attack  . Father Deceased     suicide  . Brother Deceased      suicide  . Brother Alive     HTN, hypercholesterolemia  . Brother Alive     mental retardation    ROS:      A complete 10 system review of systems was obtained and was unremarkable apart from what is mentioned above.  PHYSICAL EXAMINATION:    VITALS:   Filed Vitals:   05/25/15 1035  BP: 104/68  Pulse: 88  Height: 5' (1.524 m)  Weight: 144 lb (65.318 kg)    GEN:  The patient appears stated age and is in NAD. HEENT:  Normocephalic, atraumatic.  The mucous membranes are moist. The superficial temporal arteries are without ropiness or tenderness. CV:  RRR Lungs:  CTAB Neck/HEME:  There are no carotid bruits bilaterally.  Neurological examination:  Orientation: The patient is alert and oriented x3. Fund of knowledge is appropriate.  Recent and remote memory are intact.  Attention and concentration are normal.    Able to name objects and repeat phrases. Cranial nerves: There is good facial symmetry.  No significant facial hypomimia.  Pupils are equal round and reactive to light bilaterally. Fundoscopic exam reveals clear margins bilaterally. Extraocular muscles are intact.  There are no square wave jerks.  The visual fields are full to confrontational testing. The speech is fluent and clear. Soft palate rises symmetrically and there is no tongue deviation. Hearing is intact to conversational tone. Sensation: Sensation is intact to light touch throughout. Motor: Strength is 5/5 in the bilateral upper and lower extremities.   Shoulder shrug is equal and symmetric.  There is no pronator drift.  Movement examination: Tone: There is no rigidity in the UE/LE. Abnormal movements: there is some mild dyskinesia.   Coordination:  There is mild decremation with RAM's in the LLE>LUE Gait and Station: The patient has no significant difficulty arising out of a deep-seated chair without the use of the hands. The patient stride length is greatly improved.  She actually danced down the  hall.  ASSESSMENT/PLAN:  1.  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  She looks better today and will contine with carbidopa/levodopa 25/100 on the following schedule: 1 tablet at 5 AM, 1 tablet at 8 AM, 1 tablet at 11 AM, 1 tablet at 1 PM, 1 tablet at 4 PM and one tablet at 6 PM.  She does have some dyskinesia but it is not bothering her.  -Talked to her about risks of mirapex 1 mg tid as she ages but didn't change that today.  She has no SE with it. 2.  Piriformis syndrome  -She is doing much better.  Is following with Dr. Tamala Julian 3.  REM behavior disorder.  -  She did not want to take the clonazepam, primarily because of its addictive properties.  She did try it for a short period of time.  She tried Seroquel as well, but it caused diplopia.  She does not want to try anything further right now. 4 .  Follow-up with me in 3-4 months, sooner should new issues arise.

## 2015-06-07 ENCOUNTER — Telehealth: Payer: Self-pay | Admitting: Family Medicine

## 2015-06-07 NOTE — Telephone Encounter (Signed)
Noted  

## 2015-06-07 NOTE — Telephone Encounter (Signed)
Patient just wanted to let you know that she is feeling so much better, which is why she cancelled tomorrows appointment. If she needs anything again, you will be her first choice.

## 2015-06-08 ENCOUNTER — Ambulatory Visit: Payer: Medicare Other | Admitting: Family Medicine

## 2015-07-21 ENCOUNTER — Ambulatory Visit (INDEPENDENT_AMBULATORY_CARE_PROVIDER_SITE_OTHER): Payer: Medicare Other | Admitting: Internal Medicine

## 2015-07-21 ENCOUNTER — Encounter: Payer: Self-pay | Admitting: Internal Medicine

## 2015-07-21 VITALS — BP 116/64 | HR 62 | Temp 98.2°F | Resp 16 | Ht 60.5 in | Wt 144.0 lb

## 2015-07-21 DIAGNOSIS — R7303 Prediabetes: Secondary | ICD-10-CM

## 2015-07-21 DIAGNOSIS — Z79899 Other long term (current) drug therapy: Secondary | ICD-10-CM

## 2015-07-21 DIAGNOSIS — E785 Hyperlipidemia, unspecified: Secondary | ICD-10-CM

## 2015-07-21 DIAGNOSIS — E559 Vitamin D deficiency, unspecified: Secondary | ICD-10-CM | POA: Diagnosis not present

## 2015-07-21 DIAGNOSIS — I10 Essential (primary) hypertension: Secondary | ICD-10-CM | POA: Diagnosis not present

## 2015-07-21 DIAGNOSIS — R7309 Other abnormal glucose: Secondary | ICD-10-CM

## 2015-07-21 LAB — CBC WITH DIFFERENTIAL/PLATELET
BASOS ABS: 0.1 10*3/uL (ref 0.0–0.1)
BASOS PCT: 1 % (ref 0–1)
EOS ABS: 0.2 10*3/uL (ref 0.0–0.7)
EOS PCT: 3 % (ref 0–5)
HCT: 37 % (ref 36.0–46.0)
Hemoglobin: 12.4 g/dL (ref 12.0–15.0)
Lymphocytes Relative: 40 % (ref 12–46)
Lymphs Abs: 2.2 10*3/uL (ref 0.7–4.0)
MCH: 30.3 pg (ref 26.0–34.0)
MCHC: 33.5 g/dL (ref 30.0–36.0)
MCV: 90.5 fL (ref 78.0–100.0)
MONO ABS: 0.5 10*3/uL (ref 0.1–1.0)
MPV: 10.4 fL (ref 8.6–12.4)
Monocytes Relative: 9 % (ref 3–12)
Neutro Abs: 2.6 10*3/uL (ref 1.7–7.7)
Neutrophils Relative %: 47 % (ref 43–77)
PLATELETS: 255 10*3/uL (ref 150–400)
RBC: 4.09 MIL/uL (ref 3.87–5.11)
RDW: 13.4 % (ref 11.5–15.5)
WBC: 5.5 10*3/uL (ref 4.0–10.5)

## 2015-07-21 LAB — BASIC METABOLIC PANEL WITH GFR
BUN: 22 mg/dL (ref 7–25)
CALCIUM: 9.4 mg/dL (ref 8.6–10.4)
CO2: 27 mmol/L (ref 20–31)
Chloride: 107 mmol/L (ref 98–110)
Creat: 0.78 mg/dL (ref 0.60–0.93)
GFR, EST AFRICAN AMERICAN: 87 mL/min (ref >=60)
GFR, EST NON AFRICAN AMERICAN: 76 mL/min (ref >=60)
Glucose, Bld: 80 mg/dL (ref 65–99)
POTASSIUM: 4.4 mmol/L (ref 3.5–5.3)
SODIUM: 141 mmol/L (ref 135–146)

## 2015-07-21 LAB — HEPATIC FUNCTION PANEL
ALT: 3 U/L — AB (ref 6–29)
AST: 13 U/L (ref 10–35)
Albumin: 4.1 g/dL (ref 3.6–5.1)
Alkaline Phosphatase: 72 U/L (ref 33–130)
BILIRUBIN DIRECT: 0.1 mg/dL (ref <=0.2)
Indirect Bilirubin: 0.4 mg/dL (ref 0.2–1.2)
TOTAL PROTEIN: 6.1 g/dL (ref 6.1–8.1)
Total Bilirubin: 0.5 mg/dL (ref 0.2–1.2)

## 2015-07-21 LAB — HEMOGLOBIN A1C
Hgb A1c MFr Bld: 5.7 % — ABNORMAL HIGH (ref <5.7)
MEAN PLASMA GLUCOSE: 117 mg/dL — AB (ref <117)

## 2015-07-21 LAB — LIPID PANEL
CHOLESTEROL: 192 mg/dL (ref 125–200)
HDL: 56 mg/dL (ref >=46)
LDL Cholesterol: 117 mg/dL (ref <130)
TRIGLYCERIDES: 96 mg/dL (ref <150)
Total CHOL/HDL Ratio: 3.4 Ratio (ref <=5.0)
VLDL: 19 mg/dL (ref <30)

## 2015-07-21 LAB — TSH: TSH: 0.963 u[IU]/mL (ref 0.350–4.500)

## 2015-07-21 LAB — MAGNESIUM: MAGNESIUM: 2.1 mg/dL (ref 1.5–2.5)

## 2015-07-21 MED ORDER — BACLOFEN 10 MG PO TABS: 5.0000 mg | ORAL_TABLET | Freq: Three times a day (TID) | ORAL | Status: DC

## 2015-07-21 NOTE — Progress Notes (Signed)
Patient ID: Alexandra Lindsey, female   DOB: [birth year], 74 y.o.   MRN: [id number]  Assessment and Plan:  Hypertension:  -Continue medication,  -monitor blood pressure at home.  -Continue DASH diet.   -Reminder to go to the ER if any CP, SOB, nausea, dizziness, severe HA, changes vision/speech, left arm numbness and tingling, and jaw pain.  Cholesterol: -Continue diet and exercise.  -Check cholesterol.   Pre-diabetes: -Continue diet and exercise.  -Check A1C  Vitamin D Def: -check level -continue medications.   Parkinson's -currently well controlled -cont meds -discussed the side effects of mirapex, if worsening somnolence may need to give up driving.    Continue diet and meds as discussed. Further disposition pending results of labs.  HPI 74 y.o. female  presents for 3 month follow up with hypertension, hyperlipidemia, prediabetes and vitamin D.   Her blood pressure has been controlled at home, today their BP is BP: 116/64 mmHg.   She does workout. She denies chest pain, shortness of breath, dizziness.   She is on cholesterol medication and denies myalgias. Her cholesterol is at goal. The cholesterol last visit was:   Lab Results  Component Value Date   CHOL 196 04/12/2015   HDL 53 04/12/2015   LDLCALC 124* 04/12/2015   TRIG 94 04/12/2015   CHOLHDL 3.7 04/12/2015     She has been working on diet and exercise for prediabetes, and denies foot ulcerations, hyperglycemia, hypoglycemia , increased appetite, nausea, paresthesia of the feet, polydipsia, polyuria, visual disturbances, vomiting and weight loss. Last A1C in the office was:  Lab Results  Component Value Date   HGBA1C 5.8* 04/12/2015    Patient is on Vitamin D supplement.  Lab Results  Component Value Date   VD25OH 69 04/12/2015     The patient reports that she has been using soap during the day on her hip and she has had minimal pain on her left hip now.    Current Medications:  Current Outpatient  Prescriptions on File Prior to Visit  Medication Sig Dispense Refill  . acetaminophen (TYLENOL) 325 MG tablet Take 650 mg by mouth every 6 (six) hours as needed.    . baclofen (LIORESAL) 10 MG tablet Take 0.5 tablets (5 mg total) by mouth 3 (three) times daily. (Patient taking differently: Take 5 mg by mouth at bedtime. ) 30 each 1  . carbidopa-levodopa (SINEMET IR) 25-100 MG per tablet Take 1 tablet by mouth 6 (six) times daily. 540 tablet 1  . cholecalciferol (VITAMIN D) 1000 UNITS tablet Take 5,000 Units by mouth daily.    . cyanocobalamin 500 MCG tablet Take 500 mcg by mouth daily.    . Magnesium Oxide 500 MG TABS Take 1 tablet by mouth daily.    . Omega-3 Fatty Acids (FISH OIL) 1000 MG CAPS Take 1 capsule by mouth daily.    . pramipexole (MIRAPEX) 1 MG tablet TAKE 1 TABLET BY MOUTH 3 TIMES DAILY 90 tablet 5  . ranitidine (ZANTAC) 150 MG tablet Take 150 mg by mouth daily as needed. For acid reflex     No current facility-administered medications on file prior to visit.    Medical History:  Past Medical History  Diagnosis Date  . Parkinson's disease 10-07-12  . 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 Never smoked tobacco   . Spinal stenosis   . Costochondritis   . Hyperlipidemia 10-07-12  . Hypertension   . GERD (gastroesophageal reflux disease)   . Prediabetes   . Parkinson's  disease     Allergies:  Allergies  Allergen Reactions  . Macrobid [Nitrofurantoin Macrocrystal] Swelling and Rash  . Cephalosporins Other (See Comments)    Cannot sleep  . Codeine Nausea Only    dizziness  . Darvocet [Propoxyphene N-Acetaminophen] Nausea And Vomiting  . Levaquin [Levofloxacin] Hives  . Penicillins Rash  . Selegiline Hcl Rash  . Tramadol Nausea And Vomiting  . Triamcinolone Itching     Review of Systems:  Review of Systems  Constitutional: Positive for malaise/fatigue. Negative for fever and chills.  HENT: Negative for congestion, ear pain and sore throat.   Respiratory:  Negative for cough, shortness of breath and wheezing.   Cardiovascular: Positive for chest pain. Negative for palpitations and leg swelling.  Gastrointestinal: Negative for diarrhea, constipation, blood in stool and melena.  Genitourinary: Negative.   Skin: Negative.   Neurological: Negative for dizziness, loss of consciousness and headaches.  Psychiatric/Behavioral: Negative for depression. The patient is not nervous/anxious and does not have insomnia.     Family history- Review and unchanged  Social history- Review and unchanged  Physical Exam: BP 116/64 mmHg  Pulse 62  Temp(Src) 98.2 F (36.8 C) (Temporal)  Resp 16  Ht 5' 0.5" (1.537 m)  Wt 144 lb (65.318 kg)  BMI 27.65 kg/m2 Wt Readings from Last 3 Encounters:  07/21/15 144 lb (65.318 kg)  05/25/15 144 lb (65.318 kg)  04/20/15 143 lb (64.864 kg)    General Appearance: Well nourished well developed, in no apparent distress. Eyes: PERRLA, EOMs, conjunctiva no swelling or erythema ENT/Mouth: Ear canals normal without obstruction, swelling, erythma, discharge.  TMs normal bilaterally.  Oropharynx moist, clear, without exudate, or postoropharyngeal swelling. Neck: Supple, thyroid normal,no cervical adenopathy  Respiratory: Respiratory effort normal, Breath sounds clear A&P without rhonchi, wheeze, or rale.  No retractions, no accessory usage. Cardio: RRR with no MRGs. Brisk peripheral pulses without edema.  Abdomen: Soft, + BS,  Non tender, no guarding, rebound, hernias, masses. Musculoskeletal: Full ROM, 5/5 strength, Shuffling gait, cogwheel rigidity Skin: Warm, dry without rashes, lesions, ecchymosis.  Neuro: Awake and oriented X 3, Cranial nerves intact. Normal muscle tone, no cerebellar symptoms. Psych: Normal affect, Insight and Judgment appropriate.    Starlyn Skeans, PA-C 11:01 AM [facility]

## 2015-07-22 LAB — VITAMIN D 25 HYDROXY (VIT D DEFICIENCY, FRACTURES): Vit D, 25-Hydroxy: 51 ng/mL (ref 30–100)

## 2015-07-22 LAB — INSULIN, RANDOM: INSULIN: 4.9 u[IU]/mL (ref 2.0–19.6)

## 2015-07-30 ENCOUNTER — Telehealth: Payer: Self-pay | Admitting: Family Medicine

## 2015-07-30 NOTE — Telephone Encounter (Signed)
Patient has some company from [place] coming the week of the 12th and she was hoping to come in next week for an injection in her hip(s). Just wondering if you can work her in?

## 2015-08-03 NOTE — Telephone Encounter (Signed)
Spoke to pt, scheduled her for 1pm tomorrow.

## 2015-08-04 ENCOUNTER — Ambulatory Visit (INDEPENDENT_AMBULATORY_CARE_PROVIDER_SITE_OTHER): Payer: Medicare Other | Admitting: Family Medicine

## 2015-08-04 ENCOUNTER — Other Ambulatory Visit (INDEPENDENT_AMBULATORY_CARE_PROVIDER_SITE_OTHER): Payer: Medicare Other

## 2015-08-04 ENCOUNTER — Encounter: Payer: Self-pay | Admitting: Family Medicine

## 2015-08-04 VITALS — BP 104/72 | HR 82 | Wt 145.0 lb

## 2015-08-04 DIAGNOSIS — M7061 Trochanteric bursitis, right hip: Secondary | ICD-10-CM

## 2015-08-04 DIAGNOSIS — M25551 Pain in right hip: Secondary | ICD-10-CM

## 2015-08-04 DIAGNOSIS — G894 Chronic pain syndrome: Secondary | ICD-10-CM | POA: Insufficient documentation

## 2015-08-04 DIAGNOSIS — M533 Sacrococcygeal disorders, not elsewhere classified: Secondary | ICD-10-CM | POA: Diagnosis not present

## 2015-08-04 NOTE — Assessment & Plan Note (Signed)
Patient given another injection today. I help she will do very well. Encourage her to do the exercises on areolar basis. I do think that some of the underlying arthritis could be situated at this point. Patient is having people come in a town and likely her getting the house ready discuss some discomfort. Patient will come back and see me again in 4 weeks for further evaluation.

## 2015-08-04 NOTE — Assessment & Plan Note (Signed)
Patient given injection today and home exercises. I do not think that this is a lumbar radiculopathy with a negative straight leg test. Patient overall seems to be doing relatively well and did have good resolution of pain a most immediately afterwards. Patient will try to do conservative therapy and will see me again in 4 weeks for further evaluation.

## 2015-08-04 NOTE — [facility] Notes (Signed)
Pre visit review using our clinic review tool, if applicable. No additional management support is needed unless otherwise documented below in the visit note. 

## 2015-08-04 NOTE — Progress Notes (Signed)
[facility] [place], [place] [address] Phone: [phone] Subjective:     CC: Back pain and hip pain follow up  ASN:[id number] Alexandra Lindsey is a 74 y.o. female coming in with complaint of back pain and hip pain.    Pertinent history: Patient has been followed by neurology for Parkinson's disease. Patient does have a known history of spinal stenosis. Patient did have a further workup recently with MRI of the lumbar spine and hip. These were reviewed by me today. Patient's MRI of the lumbar spine on 10/23/2014 showed spondylosis with degenerative disc disease with moderate impingement at L4-L5 and mild impingement at L5-S1.     Patient for her back has had epidural steroid injections in December 2014 as well as April 2015. Patient states the epidural steroid injection did not seem to be beneficial.   She has seen me previously and had a sacroiliac joint dysfunction. Gen. Last injection back in May 2016. Patient states she hasn't been doing very well up to the last 3 weeks. Having pain on the same area as well as some mild pain on the right side of the hip. States that after sitting a long amount of time the right hip seems to be worse. Patient is concerned that she will have people in town and will be more active in her usual self. Patient states the pain has been so severe the last 2 days that she has not been doing even some of her daily activities. Having difficulty even with ambulation. Some mild radiation down the leg. No weakness.  Past medical history, social, surgical and family history all reviewed in electronic medical record.   Review of Systems: No headache, visual changes, nausea, vomiting, diarrhea, constipation, dizziness, abdominal pain, skin rash, fevers, chills, night sweats, weight loss, swollen lymph nodes, body aches, joint swelling, muscle aches, chest pain, shortness of breath, mood changes.   Objective Blood pressure  104/72, pulse 82, weight 145 lb (65.772 kg), SpO2 99 %.  General: No apparent distress alert and oriented x3 mood and affect normal, dressed appropriately.  HEENT: Pupils equal, extraocular movements intact  Respiratory: Patient's speak in full sentences and does not appear short of breath  Cardiovascular: No lower extremity edema, non tender, no erythema  Skin: Warm dry intact with no signs of infection or rash on extremities or on axial skeleton.  Abdomen: Soft nontender  Neuro: Cranial nerves II through XII are intact, neurovascularly intact in all extremities with 2+ DTRs and 2+ pulses.  Lymph: No lymphadenopathy of posterior or anterior cervical chain or axillae bilaterally.  Gait normal with good balance and coordination.  MSK:  Non tender with full range of motion and good stability and symmetric strength and tone of shoulders, elbows, wrist, hip, knee and ankles bilaterally.  Back Exam:  Inspection: Mild increase in kyphosis Motion: Flexion 35 deg, Extension 25 deg, Side Bending to 25 deg bilaterally,  Rotation to 25 deg bilaterally  SLR laying: Negative  XSLR laying: Negative  Palpable tenderness: Pain over the piriformis muscle noted on the left side. Still tenderness over the left sacroiliac joint.severe tenderness over the right greater trochanteric area. FABER: Mildly positive left side with some mild improvement Sensory change: Gross sensation intact to all lumbar and sacral dermatomes.  Reflexes: 2+ at both patellar tendons, 2+ at achilles tendons, Babinski's downgoing.  Strength at foot  Plantar-flexion: 5/5 Dorsi-flexion: 5/5 Eversion: 5/5 Inversion: 5/5  Leg strength  Quad: 5/5 Hamstring: 5/5  Hip flexor: 4/5 Hip abductors: 4/5  Gait unremarkable. Hip: ROM IR: 15 Deg, ER: 35 Deg, Flexion: 100 Deg, Extension: 100 Deg, Abduction: 45 Deg, Adduction: 45 Deg Strength IR: 4/5, ER: 5/5, Flexion: 5/5, Extension: 5/5, Abduction: 4/5, Adduction: 4/5 Positive Faber as well as  tenderness over the right greater trochanteric area. Pelvic alignment unremarkable to inspection and palpation. Standing hip rotation and gait without trendelenburg sign / unsteadiness.    Procedure: Real-time Ultrasound Guided Injection of right greater trochanteric bursitis secondary to patient's body habitus Device: GE Logiq E  Ultrasound guided injection is preferred based studies that show increased duration, increased effect, greater accuracy, decreased procedural pain, increased response rate, and decreased cost with ultrasound guided versus blind injection.  Verbal informed consent obtained.  Time-out conducted.  Noted no overlying erythema, induration, or other signs of local infection.  Skin prepped in a sterile fashion.  Local anesthesia: Topical Ethyl chloride.  With sterile technique and under real time ultrasound guidance:  Greater trochanteric area was visualized and patient's bursa was noted. A 22-gauge 3 inch needle was inserted and 4 cc of 0.5% Marcaine and 1 cc of Kenalog 40 mg/dL was injected. Pictures taken Completed without difficulty  Pain immediately resolved suggesting accurate placement of the medication.  Advised to call if fevers/chills, erythema, induration, drainage, or persistent bleeding.  Images permanently stored and available for review in the ultrasound unit.  Impression: Technically successful ultrasound guided injection.  Procedure: Real-time Ultrasound Guided Injection of left sacroiliac joint.  Device: GE Logiq E  Ultrasound guided injection is preferred based studies that show increased duration, increased effect, greater accuracy, decreased procedural pain, increased response rate, and decreased cost with ultrasound guided versus blind injection.  Verbal informed consent obtained.  Time-out conducted.  Noted no overlying erythema, induration, or other signs of local infection.  Skin prepped in a sterile fashion.  Local anesthesia: Topical  Ethyl chloride.  With sterile technique and under real time ultrasound guidance: With a 22-gauge 3-1/2 inch needle patient was injected under ultrasound guidance with 2 mL of 0.5% Marcaine and 1 mL of Kenalog 40 mg/dL. Completed without difficulty  Pain immediately resolved suggesting accurate placement of the medication.  Advised to call if fevers/chills, erythema, induration, drainage, or persistent bleeding.  Images permanently stored and available for review in the ultrasound unit.  Impression: Technically successful ultrasound guided injection.   Impression and Recommendations:     This case required medical decision making of moderate complexity.

## 2015-08-04 NOTE — Patient Instructions (Signed)
Good to see you Side of hip exercises 3 times weekly if possible Continue to stay active Soap 3 times  A week a little hydrocortisone.  Ice is your friend See me again in 1 month if not perfect.[name]

## 2015-08-09 ENCOUNTER — Telehealth: Payer: Self-pay | Admitting: Internal Medicine

## 2015-08-09 NOTE — Telephone Encounter (Signed)
lmovm for pt to return call.  

## 2015-08-09 NOTE — Telephone Encounter (Signed)
Pt called in and said that her left hip is feeling better but the right hip still hurts and hard to walk.  She would like to see if she could get a mri on that right hip??     Best number [phone]

## 2015-08-10 NOTE — Telephone Encounter (Signed)
Discussed with pt that dr Tamala Julian did not want to order an MRI at this time because he would not know what he's exactly looking for. Advised pt to try taking 3 ibuprofen TID for 3 days. Advised her that dr Tamala Julian would like her to come back to see him once her family leaves. Pt understood.

## 2015-08-30 ENCOUNTER — Telehealth: Payer: Self-pay | Admitting: Internal Medicine

## 2015-08-30 NOTE — Telephone Encounter (Signed)
Patient insists that i ask you... Patient states that she is feeling much better, and is not sure that she needs to come in on Wednesday. She wants to ask if you think that she should cancel her appt on Wednesday.

## 2015-08-31 NOTE — Telephone Encounter (Signed)
Left msg on vmail advising pt if she is feeling much better & she would like to cancel the appt, that would be fine.

## 2015-09-01 ENCOUNTER — Ambulatory Visit: Payer: Medicare Other | Admitting: Family Medicine

## 2015-09-13 ENCOUNTER — Ambulatory Visit (INDEPENDENT_AMBULATORY_CARE_PROVIDER_SITE_OTHER): Payer: Medicare Other | Admitting: Family Medicine

## 2015-09-13 ENCOUNTER — Encounter: Payer: Self-pay | Admitting: Family Medicine

## 2015-09-13 VITALS — BP 116/72 | HR 76 | Ht 60.5 in | Wt 144.0 lb

## 2015-09-13 DIAGNOSIS — M5416 Radiculopathy, lumbar region: Secondary | ICD-10-CM

## 2015-09-13 NOTE — Progress Notes (Signed)
[facility] [place], [place] [address] Phone: [phone] Subjective:     CC: Back pain and hip pain follow up  XTG:GYIRSWNIOE Alexandra Lindsey is a 74 y.o. female coming in with complaint of back pain and hip pain.    Pertinent history: Patient has been followed by neurology for Parkinson's disease. Patient does have a known history of spinal stenosis. Patient did have a further workup recently with MRI of the lumbar spine and hip. These were reviewed by me today. Patient's MRI of the lumbar spine on 10/23/2014 showed spondylosis with degenerative disc disease with moderate impingement at L4-L5 and mild impingement at L5-S1.     Patient for her back has had epidural steroid injections in December 2014 as well as April 2015. Patient states the epidural steroid injection did not seem to be beneficial.   Patient was continuing to have a sacroiliac joint dysfunction as well as a greater trochanteric bursitis. Patient like to try injections in the greater trochanteric bursitis with no significant improvement. Patient states though that now she's been doing the exercises on a more regular basis in the last 3 days she has been feeling much better. Sometimes before this she was having radicular symptoms going down the lateral and anterior aspect of the thighs seeming to stop at the knees. Patient states that it can be bad enough that wakes her up at night. Patient though feels that she is on the up swelling at this time. Has needed take ibuprofen at least one time daily still  Past medical history, social, surgical and family history all reviewed in electronic medical record.   Review of Systems: No headache, visual changes, nausea, vomiting, diarrhea, constipation, dizziness, abdominal pain, skin rash, fevers, chills, night sweats, weight loss, swollen lymph nodes, body aches, joint swelling, muscle aches, chest pain, shortness of breath, mood changes.    Objective Blood pressure 116/72, pulse 76, height 5' 0.5" (1.537 m), weight 144 lb (65.318 kg), SpO2 99 %.  General: No apparent distress alert and oriented x3 mood and affect normal, dressed appropriately.  HEENT: Pupils equal, extraocular movements intact  Respiratory: Patient's speak in full sentences and does not appear short of breath  Cardiovascular: No lower extremity edema, non tender, no erythema  Skin: Warm dry intact with no signs of infection or rash on extremities or on axial skeleton.  Abdomen: Soft nontender  Neuro: Cranial nerves II through XII are intact, neurovascularly intact in all extremities with 2+ DTRs and 2+ pulses.  Lymph: No lymphadenopathy of posterior or anterior cervical chain or axillae bilaterally.  Gait normal with good balance and coordination.  MSK:  Non tender with full range of motion and good stability and symmetric strength and tone of shoulders, elbows, wrist, hip, knee and ankles bilaterally.  Back Exam:  Inspection: Mild increase in kyphosis Motion: Flexion 35 deg, Extension 25 deg, Side Bending to 25 deg bilaterally,  Rotation to 25 deg bilaterally  SLR laying: Negative  XSLR laying: Negative  Palpable tenderness: Pain over the piriformis muscle noted on the left side. Still tenderness over the left sacroiliac joint.severe tenderness over the right greater trochanteric area. FABER: Mildly positive left side with some mild improvement Sensory change: Gross sensation intact to all lumbar and sacral dermatomes.  Reflexes: 2+ at both patellar tendons, 2+ at achilles tendons, Babinski's downgoing.  Strength at foot  Plantar-flexion: 5/5 Dorsi-flexion: 5/5 Eversion: 5/5 Inversion: 5/5  Leg strength  Quad: 5/5 Hamstring: 5/5 Hip  flexor: 4/5 Hip abductors: 4/5  Gait unremarkable. Hip: ROM IR: 15 Deg, ER: 35 Deg, Flexion: 100 Deg, Extension: 100 Deg, Abduction: 45 Deg, Adduction: 45 Deg Strength IR: 4/5, ER: 5/5, Flexion: 5/5, Extension: 5/5,  Abduction: 4/5, Adduction: 4/5 Positive Fabe with continued diffuse tenderness of the greater into her Morey Hummingbird a bilaterally. Pelvic alignment unremarkable to inspection and palpation. Standing hip rotation and gait without trendelenburg sign / unsteadiness. Mild positive straight leg test        Impression and Recommendations:     This case required medical decision making of moderate complexity.

## 2015-09-13 NOTE — [facility] Notes (Signed)
Pre visit review using our clinic review tool, if applicable. No additional management support is needed unless otherwise documented below in the visit note. 

## 2015-09-13 NOTE — Patient Instructions (Signed)
I am sorry you are still hurting.  I do not think the MRI will tell me more at this time.  We can consider a EMG to see if we can pinpoint a nerve that could be giving you trouble Otherwise I would consider a epidural injection in the back that I think will help.  I will give you the EMG when I get it See me again in 6 weeks otherwise.

## 2015-09-13 NOTE — Assessment & Plan Note (Signed)
Do believe the patient does have more of a lumbar radiculopathy. Do think that this is what is causing most of her pain because we are unable to actually truly pinpointed. I would like to get an EMG to see what nerves are potentially involved. I do not feel that further advanced imaging such as an MRI would give [place] significant improvement. Patient has made some improvement over the course last 72 hours but has had exacerbation since our last visit. Patient's we did not wanted to chronic prednisone with her being on different Parkinson medicines. We would like to use this and frequently. Patient did have a bad result with epidurals previously but I do think that that should be a possibility if patient has worsening symptoms. We will discuss the EMG results and depending on this we will discuss further management. Otherwise patient will follow-up in 6-8 weeks for further evaluation and treatment. Okay for patient to continue with the ibuprofen and would do it scheduled with the medications to help her with her reflux disease. Unable to an any other medications due to her other comorbidities and medications.  Spent  25 minutes with patient face-to-face and had greater than 50% of counseling including as described above in assessment and plan.

## 2015-09-14 ENCOUNTER — Encounter: Payer: Self-pay | Admitting: Internal Medicine

## 2015-09-14 DIAGNOSIS — L821 Other seborrheic keratosis: Secondary | ICD-10-CM | POA: Diagnosis not present

## 2015-09-14 DIAGNOSIS — Z85828 Personal history of other malignant neoplasm of skin: Secondary | ICD-10-CM | POA: Diagnosis not present

## 2015-09-16 ENCOUNTER — Other Ambulatory Visit: Payer: Self-pay | Admitting: *Deleted

## 2015-09-16 DIAGNOSIS — M5416 Radiculopathy, lumbar region: Secondary | ICD-10-CM

## 2015-09-21 ENCOUNTER — Telehealth: Payer: Self-pay

## 2015-09-21 ENCOUNTER — Ambulatory Visit (INDEPENDENT_AMBULATORY_CARE_PROVIDER_SITE_OTHER): Payer: Medicare Other | Admitting: Neurology

## 2015-09-21 DIAGNOSIS — M5416 Radiculopathy, lumbar region: Secondary | ICD-10-CM | POA: Diagnosis not present

## 2015-09-21 NOTE — Procedures (Signed)
[facility]  [address]  [place], [place] [address] Tel: [phone] Fax:  [phone] Test Date:  09/21/2015  Patient: Alexandra Lindsey DOB: 1941-02-12 Physician: Narda Amber  Sex: Female Height: 5'0 " Ref Phys: Creig Hines, M.D.  ID#: [id number] Temp: 33.2C Technician: Jerilynn Mages. Dean   Patient Complaints: This is a 74 year-old female presenting for evaluation of lower back pain that radiates into bilateral hips.  NCV & EMG Findings: Extensive electrodiagnostic testing of the left lower extremity and additional studies of the right shows: 1. Bilateral sural and superficial peroneal sensory responses are within normal limits. 2. Bilateral tibial and peroneal motor responses are within normal limits. 3. Left H reflex studies are within normal limits. 4. Chronic motor axon loss changes are seen affecting the L5 myotomes on the left, without accompanied active denervation. These findings are not present in the right lower extremity.  Impression: 1. 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 2. There is no evidence of a generalized sensorimotor polyneuropathy affecting the lower extremities.   _____________________________ Narda Amber, D.O.    Nerve Conduction Studies Anti Sensory Summary Table   Stim Site NR Peak (ms) Norm Peak (ms) P-T Amp (V) Norm P-T Amp  Left Sup Peroneal Anti Sensory (Ant Lat Mall)  12 cm    2.7 <4.6 5.0 >3  Right Sup Peroneal Anti Sensory (Ant Lat Mall)  12 cm    2.8 <4.6 4.9 >3  Left Sural Anti Sensory (Lat Mall)  Calf    3.3 <4.6 14.5 >3  Right Sural Anti Sensory (Lat Mall)  Calf    3.4 <4.6 9.5 >3   Motor Summary Table   Stim Site NR Onset (ms) Norm Onset (ms) O-P Amp (mV) Norm O-P Amp Site1 Site2 Delta-0 (ms) Dist (cm) Vel (m/s) Norm Vel (m/s)  Left Peroneal Motor (Ext Dig Brev)  Ankle    3.6 <6.0 7.5 >2.5 B Fib Ankle 5.3 30.0 57 >40  B Fib    8.9  7.2  Poplt B Fib 1.8 10.0 56  >40  Poplt    10.7  7.0         Right Peroneal Motor (Ext Dig Brev)  Ankle    3.5 <6.0 5.8 >2.5 B Fib Ankle 5.3 28.0 53 >40  B Fib    8.8  5.6  Poplt B Fib 1.7 10.0 59 >40  Poplt    10.5  5.4         Left Tibial Motor (Abd Hall Brev)  Ankle    3.7 <6.0 5.8 >4 Knee Ankle 6.6 36.0 55 >40  Knee    10.3  5.8         Right Tibial Motor (Abd Hall Brev)  Ankle    3.9 <6.0 6.8 >4 Knee Ankle 6.5 34.0 52 >40  Knee    10.4  6.0          H Reflex Studies   NR H-Lat (ms) Lat Norm (ms) L-R H-Lat (ms)  Left Tibial (Gastroc)     29.66 <35 0.54   EMG   Side Muscle Ins Act Fibs Psw Fasc Number Recrt Dur Dur. Amp Amp. Poly Poly. Comment  Left AntTibialis Nml Nml Nml Nml 1- Rapid Some 1+ Some 1+ Nml Nml N/A  Left GluteusMed Nml Nml Nml Nml 1- Mod-R Few 1+ Few 1+ Nml Nml N/A  Left Flex Dig Long Nml Nml Nml Nml 1- Rapid Few 1+ Few 1+ Nml Nml  N/A  Left Gastroc Nml Nml Nml Nml Nml Nml Nml Nml Nml Nml Nml Nml N/A  Left BicepsFemS Nml Nml Nml Nml Nml Nml Nml Nml Nml Nml Nml Nml N/A  Left RectFemoris Nml Nml Nml Nml Nml Nml Nml Nml Nml Nml Nml Nml N/A  Right AntTibialis Nml Nml Nml Nml Nml Nml Nml Nml Nml Nml Nml Nml N/A  Right Gastroc Nml Nml Nml Nml Nml Nml Nml Nml Nml Nml Nml Nml N/A  Right RectFemoris Nml Nml Nml Nml Nml Nml Nml Nml Nml Nml Nml Nml N/A      Waveforms:

## 2015-09-21 NOTE — Telephone Encounter (Signed)
Spoke with patient and gave her results of EMG. She is going to think about if she wants another epidural/nerve block and call us back.

## 2015-09-28 ENCOUNTER — Other Ambulatory Visit: Payer: Self-pay | Admitting: Neurology

## 2015-09-28 NOTE — Telephone Encounter (Signed)
Carbidopa Levodopa and Mirapex refill requested. Per last office note- patient to remain on medication. Refill approved and sent to patient's pharmacy.   

## 2015-10-04 DIAGNOSIS — Z23 Encounter for immunization: Secondary | ICD-10-CM | POA: Diagnosis not present

## 2015-10-07 ENCOUNTER — Ambulatory Visit (INDEPENDENT_AMBULATORY_CARE_PROVIDER_SITE_OTHER): Payer: Medicare Other | Admitting: Neurology

## 2015-10-07 ENCOUNTER — Encounter: Payer: Self-pay | Admitting: Neurology

## 2015-10-07 VITALS — BP 110/80 | HR 70 | Ht 60.0 in | Wt 144.0 lb

## 2015-10-07 DIAGNOSIS — G20 Parkinson's disease: Secondary | ICD-10-CM

## 2015-10-07 DIAGNOSIS — G20A1 Parkinson's disease without dyskinesia, without mention of fluctuations: Secondary | ICD-10-CM

## 2015-10-07 DIAGNOSIS — G20B1 Parkinson's disease with dyskinesia, without mention of fluctuations: Secondary | ICD-10-CM

## 2015-10-07 DIAGNOSIS — M5416 Radiculopathy, lumbar region: Secondary | ICD-10-CM | POA: Diagnosis not present

## 2015-10-07 DIAGNOSIS — G249 Dystonia, unspecified: Secondary | ICD-10-CM | POA: Diagnosis not present

## 2015-10-07 MED ORDER — CARBIDOPA-LEVODOPA ER 50-200 MG PO TBCR: 1.0000 | EXTENDED_RELEASE_TABLET | Freq: Every day | ORAL | Status: DC

## 2015-10-07 NOTE — Patient Instructions (Signed)
1.  Continue your carbidopa/levodopa 25/100 at 5am/8am/11am/1pm/4pm/6pm 2.  Add carbidopa/levodopa 50/200 to your bedtime routine 3.  Have blessed holidays!

## 2015-10-07 NOTE — Progress Notes (Signed)
Alexandra Lindsey was seen today in the movement disorders clinic for neurologic consultation at the request of MCKEOWN,WILLIAM DAVID, MD.  The patient has previously seen both Dr. Erling Cruz and Dr. Rexene Alberts.  The records that were available to me were reviewed.  The consultation is for the evaluation and treatment of Parkinson's disease.  The patient has had a diagnosis of Parkinson's disease since at least 2002.  The first symptom was inability to get out of a chair and dragging the L leg and shuffling.  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    The patient was placed on Mirapex in 2002 and remains on that medication.  Once she was started on that medication she could immediately clog again.  She is currently on 1 mg 3 times a day.  She was placed on Stalevo 3 or 4 years later and is currently on Stalevo 100 mg, one tablet at 6 AM, half a tablet at 9 AM, 1 tablet at noon, half a tablet at 3 PM and one tablet at 6 PM.  In addition, she takes a regular carbidopa/levodopa 25/100 in the AM and chews it.  If she doesn't do that, she shuffles.  She chews that pill and then 45 mins later starts the Stalevo regimen.  She does state that Stalevo has become quite expensive for her and her insurance is going to change and asks about alternatives.    10/06/14 update:  The patient presents today, accompanied by her husband to supplement the history.  Last visit, I discontinued the patient's Stalevo, primarily because of cost and changed her to cupboard up a/levodopa 25/100, and we decided to hold the entacapone component.  She chews one tablet in the morning followed by carbidopa/levodopa 25/100 one tablet at 6 AM, half a tablet at 9 AM, 1 tablet at noon, half a tablet at 3 PM and one tablet at 6 PM.  She admits that she did not even take her 3 PM dose today, and is not sure that she even needs it.  Overall, from a Parkinson standpoint she feels great.  She admits to  some dyskinesia in the lower legs, but states that that is not bothersome.  Her biggest issue continues to be low back pain and left hip pain.  She has been back to Dr. Neomia Dear but did not get much relief.  She subsequently saw Dr. Lorin Mercy at [facility].  She was started on 100 mg of Neurontin at bedtime, but that only helped her sleep.  She states that first thing in the morning she has such pain in the left hip and back that she can barely walk.  01/07/15 update:  Pt f/u today re: PD.  She is on carbidopa/levodopa 25/100.  She chews one tablet in the morning followed by carbidopa/levodopa 25/100 one tablet at 6 AM, half a tablet at 9 AM, 1 tablet at noon, half a tablet at 3 PM and one tablet at 6 PM.  She is also on mirapex 1 mg tid.  She came off of meds today so that I could see what she looks like off.  She feels very stiff.  She feels that she isnt moving well.  She did have one fall since last visit.  She fell off of the stool at her vanity.   She didn't get hurt.   She saw Dr. Tamala Julian since last visit.  I  reviewed those records.  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  She is doing much better in that regard.  03/29/15 update:  Pt states that she is not doing as well as she was because of the back/leg pain again.  She continues to see Dr. Tamala Julian and has had further injections.  This one was in the bursa and she states that this one didn't help.  She is not walking as well.   She states that her PD meds are wearing off before they are due for the next dosing.  She takes one full tablet of levodopa at 5 AM (chewed), one tablet at 8 AM, half a tablet at 11 AM, 1 tablet at 1 PM, half a tablet at 4 PM and one tablet at 6 PM.  She remains on pramipexole 1.0 mg 3 times per day.  She just does not feel like she has the energy that she use to.  She has very little dyskinesia.  She has not had any falls.  05/25/15 update:  Pt is f/u today. I increased her  carbidopa/levodopa 25/100 to one tablet 6 times a day and she is on pramipexole 1.0 mg tid.  I referred her for neurorehab but she declined and she did not want therapy.  She has been to see Dr Tamala Julian and she had a SI joint injection.  She states that she uses a bar of soap in sheets for leg cramps and started putting that on her hip.  She now lathers soap on her hip and thinks that is why her hip is virtually pain free.  She also states that she was having weak spells and her gabapentin was d/c and she is doing better.  She is trying to exercise some at home (leg lifts, sit ups, dancing/clogging).  She is overall feeling great and better than she has is over a year.  She is very pleased.  10/07/15 update:  The patient is following up today regarding her Parkinson's disease.  She remains on pramipexole, 1.0 mg 3 times a day.  She is also on carbidopa/levodopa 1 tablet at 5 AM/8 AM/11 AM/1 PM/4 PM/6 PM, for a total of 600 mg of levodopa per day.  She has had some shuffling and the AM is the worst.  Also, whenever she gets up in the middle of the night to go to the bathroom, she can hardly get off of the bed and has trouble moving.  No falls since our last visit.  She has been trying to exercise/dance but that has been more troublesome lately.  No hallucinations but does have some "bad dreams."   I did review her records since our last visit.  She has seen Dr. Hulan Saas 2 times since our last visit regarding her hip and low back pain as well as SI joint dysfunction.  He ordered an EMG which was done by Dr. Posey Pronto on 09/21/2015.  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  She states that she is rubbing bar soap on it and she feels that this helps tremendously.  She did d/c baclofen because it was causing cognitive dulling.  She states that she d/c gabapentin as well as made her feel weak and without energy.    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 Macrobid [Nitrofurantoin Macrocrystal]  Swelling and Rash  . Cephalosporins Other (See Comments)    Cannot sleep  . Codeine Nausea Only    dizziness  . Darvocet [Propoxyphene N-Acetaminophen] Nausea And Vomiting  . Levaquin [Levofloxacin] Hives  . Penicillins Rash  . Selegiline Hcl Rash  . Tramadol Nausea And Vomiting  . Triamcinolone Itching    CURRENT MEDICATIONS:  Outpatient Encounter Prescriptions as of 10/07/2015  Medication Sig  . cholecalciferol (VITAMIN D) 1000 UNITS tablet Take 5,000 Units by mouth daily.  . cyanocobalamin 500 MCG tablet Take 500 mcg by mouth daily.  [name] ibuprofen (ADVIL,MOTRIN) 200 MG tablet Take 200 mg by mouth every 6 (six) hours as needed.  . Magnesium Oxide 500 MG TABS Take 1 tablet by mouth daily.  . pramipexole (MIRAPEX) 1 MG tablet TAKE 1 TABLET BY MOUTH 3 TIMES DAILY  . ranitidine (ZANTAC) 150 MG tablet Take 150 mg by mouth daily as needed. For acid reflex  . carbidopa-levodopa (SINEMET IR) 25-100 MG tablet TAKE 1 TABLET BY MOUTH 6 TIMES DAILY  . [DISCONTINUED] acetaminophen (TYLENOL) 325 MG tablet Take 650 mg by mouth every 6 (six) hours as needed.  . [DISCONTINUED] baclofen (LIORESAL) 10 MG tablet Take 0.5 tablets (5 mg total) by mouth 3 (three) times daily.  . [DISCONTINUED] Omega-3 Fatty Acids (FISH OIL) 1000 MG CAPS Take 1 capsule by mouth daily.   No facility-administered encounter medications on file as of 10/07/2015.    PAST MEDICAL HISTORY:   Past Medical History  Diagnosis Date  . Parkinson's disease ([place]) 10-07-12  . 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 Never smoked tobacco   . Spinal stenosis   . Costochondritis   . Hyperlipidemia 10-07-12  . Hypertension   . GERD (gastroesophageal reflux disease)   . Prediabetes   . Parkinson's disease ([place])     PAST SURGICAL HISTORY:   Past Surgical History  Procedure Laterality  Date  . Abdominal hysterectomy    . Hernia repair    . Rotator cuff repair Left 10-07-12  . Bunionectomy Bilateral 11-11-3  . Cardiac catheterization Bilateral 2013  . Left heart catheterization with coronary angiogram N/A 07/03/2012    Procedure: LEFT HEART CATHETERIZATION WITH CORONARY ANGIOGRAM;  Surgeon: Troy Sine, MD;  Location: [facility] CATH LAB;  Service: Cardiovascular;  Laterality: N/A;    SOCIAL HISTORY:   Social History   Social History  . Marital Status: Married    Spouse Name: N/A  . Number of Children: N/A  . Years of Education: N/A   Occupational History  . retired     Chiropractor   Social History Main Topics  . Smoking status: Never Smoker   . Smokeless tobacco: Not on file  . Alcohol Use: No  . Drug Use: No  . Sexual Activity: Not on file   Other Topics Concern  . Not on file   Social History Narrative    FAMILY HISTORY:   Family Status  Relation Status Death Age  . Mother Deceased     esophageal cancer, heart attack  . Father Deceased     suicide  . Brother Deceased     suicide  . Brother Alive     HTN, hypercholesterolemia  . Brother Alive     mental retardation    ROS:      A complete 10 system review of systems was obtained and was unremarkable apart from what is mentioned above.  PHYSICAL EXAMINATION:    VITALS:   Filed Vitals:   10/07/15 1111  BP: 110/80  Pulse: 70  Height: 5' (1.524 m)  Weight: 144 lb (65.318 kg)    GEN:  The patient appears stated age and is in NAD. HEENT:  Normocephalic, atraumatic.  The mucous membranes are moist. The superficial temporal arteries are without ropiness or tenderness. CV:  RRR Lungs:  CTAB Neck/HEME:  There are no carotid bruits bilaterally.  Neurological examination:  Orientation: The patient is alert and oriented x3. Fund of knowledge is appropriate.  Recent and remote memory are intact.  Attention and concentration are normal.    Able to name objects and repeat phrases. Cranial  nerves: There is good facial symmetry.  No significant facial hypomimia.  Pupils are equal round and reactive to light bilaterally. Fundoscopic exam reveals clear margins bilaterally. Extraocular muscles are intact.  There are no square wave jerks.  The visual fields are full to confrontational testing. The speech is fluent and clear. Soft palate rises symmetrically and there is no tongue deviation. Hearing is intact to conversational tone. Sensation: Sensation is intact to light touch throughout. Motor: Strength is 5/5 in the bilateral upper and lower extremities.   Shoulder shrug is equal and symmetric.  There is no pronator drift.  Movement examination: Tone: There is no rigidity in the UE/LE. Abnormal movements: there is some mild dyskinesia (took med about 30 mins before examined) Coordination:  There is mild decremation with RAM's in the LLE>LUE Gait and Station: The patient has no significant difficulty arising out of a deep-seated chair without the use of the hands. The patient stride length is greatly improved.  She actually danced down the hall again today (last visit as well).  ASSESSMENT/PLAN:  1.  Idiopathic PD.    -She looks good today and will contine with carbidopa/levodopa 25/100 on the following schedule: 1 tablet at 5 AM, 1 tablet at 8 AM, 1 tablet at 11 AM, 1 tablet at 1 PM, 1 tablet at 4 PM and one tablet at 6 PM.  She does have some dyskinesia but it is not bothering her.  -Talked to her about risks of mirapex 1 mg tid as she ages but didn't change that today.  She has no SE with it.  -add carbidopa/levodopa 50/200 q hs as having trouble rolling over at night and having trouble with morning "on." 2.  Piriformis syndrome  -She is doing much better.  Is following with Dr. Tamala Julian 3.  REM behavior disorder.  -  She did not want to take the clonazepam, primarily because of its addictive properties.  She did try it for a short period of time.  She tried Seroquel as well, but it  caused diplopia.  She does not want to try anything further right now. 4 .  Follow-up with me in 3-4 months, sooner should new issues arise.  Much greater than 50% of this visit was spent in counseling with the patient and the family.  Total face to face time:  25 min

## 2015-10-12 ENCOUNTER — Telehealth: Payer: Self-pay | Admitting: Neurology

## 2015-10-12 NOTE — Telephone Encounter (Signed)
PT called in regards to her Carbadopa/Dawn CB# [phone] or [phone]

## 2015-10-12 NOTE — Telephone Encounter (Signed)
Patient states since starting Carbidopa Levodopa 50/200 at night she has felt terrible. She is having weakness in her legs and now having to take "baby steps" when she walks. She states this is all day long. Please advise.

## 2015-10-12 NOTE — Telephone Encounter (Signed)
That doesn't make much sense at all.  Have her d/c it and call her in week

## 2015-10-12 NOTE — Telephone Encounter (Signed)
Patient made aware.

## 2015-10-13 ENCOUNTER — Encounter: Payer: Self-pay | Admitting: Physician Assistant

## 2015-10-13 ENCOUNTER — Telehealth: Payer: Self-pay | Admitting: Neurology

## 2015-10-13 ENCOUNTER — Ambulatory Visit (INDEPENDENT_AMBULATORY_CARE_PROVIDER_SITE_OTHER): Payer: Medicare Other | Admitting: Physician Assistant

## 2015-10-13 VITALS — BP 110/70 | HR 86 | Temp 97.5°F | Resp 14 | Ht 60.0 in | Wt 144.0 lb

## 2015-10-13 DIAGNOSIS — M546 Pain in thoracic spine: Secondary | ICD-10-CM

## 2015-10-13 DIAGNOSIS — Z79899 Other long term (current) drug therapy: Secondary | ICD-10-CM | POA: Diagnosis not present

## 2015-10-13 DIAGNOSIS — D649 Anemia, unspecified: Secondary | ICD-10-CM

## 2015-10-13 DIAGNOSIS — R350 Frequency of micturition: Secondary | ICD-10-CM

## 2015-10-13 DIAGNOSIS — R5383 Other fatigue: Secondary | ICD-10-CM | POA: Diagnosis not present

## 2015-10-13 NOTE — Telephone Encounter (Signed)
VM-PT returned your call/Dawn CB# [phone]

## 2015-10-13 NOTE — Telephone Encounter (Signed)
Spoke with patient - she said she woke up okay this morning but started feeling weak and having the same troubles as before around 7:30 am. She is also having aching between her shoulder blades. I advised her that she should probably see her PCP to make sure she doesn't have something else going on (UTI, etc.) She will do this and let us know.

## 2015-10-13 NOTE — Telephone Encounter (Signed)
Left message on machine for patient to call back.

## 2015-10-13 NOTE — Progress Notes (Signed)
Subjective:    Patient ID: Alexandra Lindsey, female    DOB: Apr 02, 1941, 74 y.o.   MRN: [id number]  HPI 74 y.o. WF with history of parkinsons, HTN, presents with worsening bradykensia.  She saw Dr. Carles Collet on the 10th and at that time she was complaining of worsening shuffling gait and inablity to get out of the chair in the AM, she was given slow release night time carbidopa/levodopa and states that next morning she was having difficult with walking, difficulty with getting up from a chair/bed, shuffling gait, had to walk with her cane. She was on the new pill for 5 nights, stopped it on the 15th and has come in for evaluation.   She continue to have difficulty with standing, she is having to use her cane. She has had some urinary frequency, has constipation. She has had chronic dull ache across her shoulders/ posterior back for 3 days, no accompaniments, not exertional,  not worse with movement,  She also complains of feeling weak from head to toe and has gotten worse each day.   No SOB, wheezing, coughing.   Blood pressure 110/70, pulse 86, temperature 97.5 F (36.4 C), resp. rate 14, height 5' (1.524 m), weight 144 lb (65.318 kg), SpO2 99 %.  Current Outpatient Prescriptions on File Prior to Visit  Medication Sig Dispense Refill  . carbidopa-levodopa (SINEMET CR) 50-200 MG tablet Take 1 tablet by mouth at bedtime. 180 tablet 3  . carbidopa-levodopa (SINEMET IR) 25-100 MG tablet TAKE 1 TABLET BY MOUTH 6 TIMES DAILY 540 tablet 1  . cholecalciferol (VITAMIN D) 1000 UNITS tablet Take 5,000 Units by mouth daily.    . cyanocobalamin 500 MCG tablet Take 500 mcg by mouth daily.    [name] ibuprofen (ADVIL,MOTRIN) 200 MG tablet Take 200 mg by mouth every 6 (six) hours as needed.    . Magnesium Oxide 500 MG TABS Take 1 tablet by mouth daily.    . pramipexole (MIRAPEX) 1 MG tablet TAKE 1 TABLET BY MOUTH 3 TIMES DAILY 270 tablet 1  . ranitidine (ZANTAC) 150 MG tablet Take 150 mg by mouth daily as needed. For  acid reflex     No current facility-administered medications on file prior to visit.   Past Medical History  Diagnosis Date  . Parkinson's disease ([place]) 10-07-12  . 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 Never smoked tobacco   . Spinal stenosis   . Costochondritis   . Hyperlipidemia 10-07-12  . Hypertension   . GERD (gastroesophageal reflux disease)   . Prediabetes   . Parkinson's disease ([place])     Review of Systems  Constitutional: Positive for activity change and fatigue. Negative for fever, chills, diaphoresis, appetite change and unexpected weight change.  HENT: Negative.   Eyes: Negative.  Negative for visual disturbance.  Respiratory: Negative.  Negative for shortness of breath.   Cardiovascular: Negative.   Gastrointestinal: Positive for constipation. Negative for nausea, vomiting, abdominal pain, diarrhea, blood in stool, abdominal distention, anal bleeding and rectal pain.  Genitourinary: Positive for frequency. Negative for dysuria, urgency, hematuria, flank pain, decreased urine volume, vaginal bleeding, vaginal discharge, enuresis, difficulty urinating, genital sores, vaginal pain, menstrual problem, pelvic pain and dyspareunia.  Musculoskeletal: Positive for back pain, arthralgias and gait problem. Negative for myalgias, joint swelling, neck pain and neck stiffness.  Skin: Negative.  Negative for rash.  Neurological: Positive for weakness (diffuse). Negative for dizziness, tremors, seizures, syncope, facial asymmetry, speech difficulty, light-headedness, numbness and headaches.  Psychiatric/Behavioral: Positive for  sleep disturbance and dysphoric mood (very emotional right now). Negative for suicidal ideas, hallucinations, behavioral problems, confusion, self-injury, decreased concentration and agitation. The patient is not nervous/anxious and is not hyperactive.        Objective:   Physical Exam  Constitutional: She appears well-developed and well-nourished. No  distress.  HENT:  Right Ear: External ear normal.  Left Ear: External ear normal.  Nose: Nose normal.  Mouth/Throat: Oropharynx is clear and moist.  Eyes: Conjunctivae and EOM are normal. Pupils are equal, round, and reactive to light.  Neck: Normal range of motion. Neck supple.  Cardiovascular: Normal rate and regular rhythm.   Pulmonary/Chest: Effort normal and breath sounds normal. She has no wheezes.  Abdominal: Soft. Bowel sounds are normal. She exhibits no mass. There is no tenderness. There is no rebound and no guarding.  Musculoskeletal: Normal range of motion.  None tender thoracic spine and paraspinous muscles.   Lymphadenopathy:    She has no cervical adenopathy.  Neurological: She is alert. She displays no tremor. No cranial nerve deficit or sensory deficit.  Cranial nerves: II: visual field normal, II: pupils equal, round, reactive to light and accommodation, VII: upper facial muscle function normal bilaterally, VII: lower facial muscle function normal bilaterally, IX: soft palate elevation normal in midline, XI: trapezius strength normal bilaterally, XI: sternocleidomastoid strength normal bilaterally, XI: neck flexion strength normal Motor: grossly normal, no notable cog wheeling Coordination: finger to nose normal, no pronator drift Gait: slow to get up from the chair, + shuffling slow gait with cane.   Skin: Skin is warm and dry. No rash noted.  Psychiatric: She has a normal mood and affect.  Patient tearful       Assessment & Plan:  Weakness/shoulder pain Normal EKG in the office, will check labs to rule out infection, electrolyte abnormality Will get Xray of thoracic back, rule out OA versus compression fracture May need ESR next OV  Urinary frequency Check UA for UTI  Bradykinesia Follow up Dr. Carles Collet  If any worsening back pain, any accompaniments like SOB, dizziness then go to ER.

## 2015-10-13 NOTE — Telephone Encounter (Signed)
Pt called to inform that the med for her parkinson dz isn't working well/call back @ [phone]

## 2015-10-14 LAB — IRON AND TIBC
%SAT: 17 % (ref 11–50)
IRON: 68 ug/dL (ref 45–160)
TIBC: 403 ug/dL (ref 250–450)
UIBC: 335 ug/dL (ref 125–400)

## 2015-10-14 LAB — URINALYSIS, MICROSCOPIC ONLY
BACTERIA UA: NONE SEEN [HPF]
CASTS: NONE SEEN [LPF]
Crystals: NONE SEEN [HPF]
RBC / HPF: NONE SEEN RBC/HPF (ref <=2)
YEAST: NONE SEEN [HPF]

## 2015-10-14 LAB — URINALYSIS, ROUTINE W REFLEX MICROSCOPIC
Bilirubin Urine: NEGATIVE
Glucose, UA: NEGATIVE
Hgb urine dipstick: NEGATIVE
Nitrite: NEGATIVE
PROTEIN: NEGATIVE
SPECIFIC GRAVITY, URINE: 1.031 (ref 1.001–1.035)
pH: 5.5 (ref 5.0–8.0)

## 2015-10-14 LAB — BASIC METABOLIC PANEL WITH GFR
BUN: 26 mg/dL — ABNORMAL HIGH (ref 7–25)
CALCIUM: 9.1 mg/dL (ref 8.6–10.4)
CHLORIDE: 109 mmol/L (ref 98–110)
CO2: 26 mmol/L (ref 20–31)
Creat: 1.13 mg/dL — ABNORMAL HIGH (ref 0.60–0.93)
GFR, EST NON AFRICAN AMERICAN: 48 mL/min — AB (ref >=60)
GFR, Est African American: 56 mL/min — ABNORMAL LOW (ref >=60)
GLUCOSE: 92 mg/dL (ref 65–99)
POTASSIUM: 4.1 mmol/L (ref 3.5–5.3)
Sodium: 142 mmol/L (ref 135–146)

## 2015-10-14 LAB — CBC WITH DIFFERENTIAL/PLATELET
BASOS ABS: 0.1 10*3/uL (ref 0.0–0.1)
Basophils Relative: 1 % (ref 0–1)
EOS PCT: 2 % (ref 0–5)
Eosinophils Absolute: 0.1 10*3/uL (ref 0.0–0.7)
HEMATOCRIT: 36.3 % (ref 36.0–46.0)
Hemoglobin: 12 g/dL (ref 12.0–15.0)
LYMPHS ABS: 1.8 10*3/uL (ref 0.7–4.0)
LYMPHS PCT: 34 % (ref 12–46)
MCH: 30.3 pg (ref 26.0–34.0)
MCHC: 33.1 g/dL (ref 30.0–36.0)
MCV: 91.7 fL (ref 78.0–100.0)
MPV: 10.4 fL (ref 8.6–12.4)
Monocytes Absolute: 0.4 10*3/uL (ref 0.1–1.0)
Monocytes Relative: 8 % (ref 3–12)
NEUTROS ABS: 3 10*3/uL (ref 1.7–7.7)
NEUTROS PCT: 55 % (ref 43–77)
PLATELETS: 280 10*3/uL (ref 150–400)
RBC: 3.96 MIL/uL (ref 3.87–5.11)
RDW: 14 % (ref 11.5–15.5)
WBC: 5.4 10*3/uL (ref 4.0–10.5)

## 2015-10-14 LAB — HEPATIC FUNCTION PANEL
ALK PHOS: 62 U/L (ref 33–130)
ALT: 5 U/L — AB (ref 6–29)
AST: 13 U/L (ref 10–35)
Albumin: 3.9 g/dL (ref 3.6–5.1)
BILIRUBIN DIRECT: 0.1 mg/dL (ref <=0.2)
BILIRUBIN INDIRECT: 0.2 mg/dL (ref 0.2–1.2)
TOTAL PROTEIN: 6.1 g/dL (ref 6.1–8.1)
Total Bilirubin: 0.3 mg/dL (ref 0.2–1.2)

## 2015-10-14 LAB — MAGNESIUM: Magnesium: 2.1 mg/dL (ref 1.5–2.5)

## 2015-10-14 LAB — URINE CULTURE
COLONY COUNT: NO GROWTH
ORGANISM ID, BACTERIA: NO GROWTH

## 2015-10-14 LAB — VITAMIN B12: VITAMIN B 12: 692 pg/mL (ref 211–911)

## 2015-10-14 LAB — FERRITIN: FERRITIN: 80 ng/mL (ref 10–291)

## 2015-10-14 LAB — TSH: TSH: 1.147 u[IU]/mL (ref 0.350–4.500)

## 2015-10-15 NOTE — Progress Notes (Signed)
Pt states that she will go get the X-ray of her back Monday and pt also states that she still feels bad.

## 2015-10-19 ENCOUNTER — Encounter: Payer: Self-pay | Admitting: Physician Assistant

## 2015-10-19 ENCOUNTER — Telehealth: Payer: Self-pay | Admitting: Neurology

## 2015-10-19 NOTE — Telephone Encounter (Signed)
Left message on machine for patient to call back. To see how she is doing after having some back pain and difficulty ambulating last week. Awaiting call back.

## 2015-10-19 NOTE — Telephone Encounter (Signed)
Spoke with patient and she states she is feeling better, but not 100%. She is still off Levodopa 50/200 at night. She will follow up with PCP about lab results.

## 2015-11-10 ENCOUNTER — Ambulatory Visit (INDEPENDENT_AMBULATORY_CARE_PROVIDER_SITE_OTHER): Payer: Medicare Other | Admitting: Internal Medicine

## 2015-11-10 ENCOUNTER — Encounter: Payer: Self-pay | Admitting: Internal Medicine

## 2015-11-10 VITALS — BP 114/80 | HR 76 | Temp 97.3°F | Resp 16 | Ht 59.25 in | Wt 146.2 lb

## 2015-11-10 DIAGNOSIS — R7303 Prediabetes: Secondary | ICD-10-CM | POA: Diagnosis not present

## 2015-11-10 DIAGNOSIS — Z1389 Encounter for screening for other disorder: Secondary | ICD-10-CM

## 2015-11-10 DIAGNOSIS — E559 Vitamin D deficiency, unspecified: Secondary | ICD-10-CM

## 2015-11-10 DIAGNOSIS — Z1331 Encounter for screening for depression: Secondary | ICD-10-CM

## 2015-11-10 DIAGNOSIS — R296 Repeated falls: Secondary | ICD-10-CM | POA: Diagnosis not present

## 2015-11-10 DIAGNOSIS — Z79899 Other long term (current) drug therapy: Secondary | ICD-10-CM | POA: Diagnosis not present

## 2015-11-10 DIAGNOSIS — R7309 Other abnormal glucose: Secondary | ICD-10-CM

## 2015-11-10 DIAGNOSIS — E785 Hyperlipidemia, unspecified: Secondary | ICD-10-CM

## 2015-11-10 DIAGNOSIS — K219 Gastro-esophageal reflux disease without esophagitis: Secondary | ICD-10-CM | POA: Diagnosis not present

## 2015-11-10 DIAGNOSIS — Z1212 Encounter for screening for malignant neoplasm of rectum: Secondary | ICD-10-CM

## 2015-11-10 DIAGNOSIS — G20A1 Parkinson's disease without dyskinesia, without mention of fluctuations: Secondary | ICD-10-CM

## 2015-11-10 DIAGNOSIS — Z23 Encounter for immunization: Secondary | ICD-10-CM

## 2015-11-10 DIAGNOSIS — I10 Essential (primary) hypertension: Secondary | ICD-10-CM | POA: Diagnosis not present

## 2015-11-10 DIAGNOSIS — Z9181 History of falling: Secondary | ICD-10-CM

## 2015-11-10 DIAGNOSIS — G20 Parkinson's disease: Secondary | ICD-10-CM | POA: Diagnosis not present

## 2015-11-10 LAB — HEPATIC FUNCTION PANEL
ALK PHOS: 66 U/L (ref 33–130)
ALT: 4 U/L — AB (ref 6–29)
AST: 14 U/L (ref 10–35)
Albumin: 3.8 g/dL (ref 3.6–5.1)
BILIRUBIN DIRECT: 0.1 mg/dL (ref <=0.2)
BILIRUBIN INDIRECT: 0.3 mg/dL (ref 0.2–1.2)
BILIRUBIN TOTAL: 0.4 mg/dL (ref 0.2–1.2)
Total Protein: 5.9 g/dL — ABNORMAL LOW (ref 6.1–8.1)

## 2015-11-10 LAB — BASIC METABOLIC PANEL WITH GFR
BUN: 21 mg/dL (ref 7–25)
CHLORIDE: 108 mmol/L (ref 98–110)
CO2: 25 mmol/L (ref 20–31)
Calcium: 9.4 mg/dL (ref 8.6–10.4)
Creat: 0.81 mg/dL (ref 0.60–0.93)
GFR, EST NON AFRICAN AMERICAN: 72 mL/min (ref >=60)
GFR, Est African American: 83 mL/min (ref >=60)
Glucose, Bld: 83 mg/dL (ref 65–99)
POTASSIUM: 4.3 mmol/L (ref 3.5–5.3)
SODIUM: 138 mmol/L (ref 135–146)

## 2015-11-10 LAB — LIPID PANEL
CHOL/HDL RATIO: 3.8 ratio (ref <=5.0)
CHOLESTEROL: 199 mg/dL (ref 125–200)
HDL: 52 mg/dL (ref >=46)
LDL CALC: 119 mg/dL (ref <130)
Triglycerides: 138 mg/dL (ref <150)
VLDL: 28 mg/dL (ref <30)

## 2015-11-10 NOTE — Progress Notes (Signed)
Patient ID: Alexandra Lindsey, female   DOB: [birth year], 74 y.o.   MRN: [id number]   Comprehensive Evaluation & Examination  This very nice 74 y.o. MWM presents for presents for comprehensive evaluation and management of multiple medical co-morbidities.  Patient has been followed for labile HTN, Prediabetes, Parkinson's Dz, Hyperlipidemia and Vitamin D Deficiency. Patient has Parkinson's Dz predating at least since 2002 and currently is followed by Dr [name].    Patient has prior hx/o elevated BP's and is followed expectantly. Patient's BP has been controlled and today's BP: 114/80 mmHg. Patient denies any cardiac symptoms as chest pain, palpitations, shortness of breath, dizziness or ankle swelling. Patient had negative Cardiolites in 2007 & 2011 wit EF of 78%.    Patient has hyperlipidemia and is attempting control with diet.  Last lipids were not at goal with Cholesterol 192; HDL 56; LDL 117; Triglycerides 96 on 07/21/2015.   Patient has prediabetes since 2011 with A1c 6.3% and is attempting dietary management  and patient denies reactive hypoglycemic symptoms, visual blurring, diabetic polys or paresthesias. Last A1c was improved with 5.7% on 07/21/2015.     Finally, patient has history of Vitamin D Deficiency of 23 in 2008 and last vitamin D was improved at 51 on 07/21/2015.   Medication Sig  . carbidopa-levodopa (SINEMET IR) 25-100 MG  TAKE 1 TABLET BY MOUTH 6 TIMES DAILY  . VITAMIN D 1000 UNITS tablet Take 5,000 Units by mouth daily.  . Vit B12  500 MCG  Take 500 mcg by mouth daily.  [name] ibuprofen200 MG tablet Take 200 mg by mouth every 6 (six) hours as needed.  . Magnesium Oxide 500 MG  Take 1 tablet by mouth daily.  . pramipexole (MIRAPEX) 1 MG  TAKE 1 TABLET BY MOUTH 3 TIMES DAILY  . ranitidine (ZANTAC) 150 MG  Take 150 mg by mouth daily as needed. For acid reflex   Allergies  Allergen Reactions  . Macrobid [Nitrofurantoin Macrocrystal] Swelling and Rash  . Cephalosporins Other (See Comments)     Cannot sleep  . Codeine Nausea Only    dizziness  . Darvocet [Propoxyphene N-Acetaminophen] Nausea And Vomiting  . Levaquin [Levofloxacin] Hives  . Penicillins Rash  . Selegiline Hcl Rash  . Tramadol Nausea And Vomiting  . Triamcinolone Itching   Past Medical History  Diagnosis Date  . Parkinson's disease ([place]) 10-07-12  . 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 Never smoked tobacco   . Spinal stenosis   . Costochondritis   . Hyperlipidemia 10-07-12  . Hypertension   . GERD (gastroesophageal reflux disease)   . Prediabetes   . Parkinson's disease [facility])    Health Maintenance  Topic Date Due  . PNA vac Low Risk Adult (1 of 2 - PCV13) 11/28/2007  . MAMMOGRAM  10/10/2015  . INFLUENZA VACCINE  06/27/2016  . COLONOSCOPY  12/03/2016  . TETANUS/TDAP  09/09/2023  . DEXA SCAN  Completed  . ZOSTAVAX  Completed   Immunization History  Administered Date(s) Administered  . Influenza, High Dose Seasonal PF 09/08/2014, 10/04/2015  . Pneumococcal-Unspecified 11/27/2006  . Td 09/08/2013  . Zoster 06/27/2006   Past Surgical History  Procedure Laterality Date  . Abdominal hysterectomy    . Hernia repair    . Rotator cuff repair Left 10-07-12  . Bunionectomy Bilateral 11-11-3  . Cardiac catheterization Bilateral 2013  . Left heart catheterization with coronary angiogram N/A 07/03/2012    Procedure: LEFT HEART CATHETERIZATION WITH CORONARY ANGIOGRAM;  Surgeon: Troy Sine, MD;  Location: [facility];  Service: Cardiovascular;  Laterality: N/A;   No family history on file.  Social History   Social History  . Marital Status: Married    Spouse Name: N/A  . Number of Children: N/A  . Years of Education: N/A   Occupational History  . retired     Chiropractor   Social History Main Topics  . Smoking status: Never Smoker   . Smokeless tobacco: Not on file  . Alcohol Use: No  . Drug Use: No  . Sexual Activity: Not on file   Other Topics Concern  . Not on file    Social History Narrative    ROS Constitutional: Denies fever, chills, weight loss/gain, headaches, insomnia,  night sweats or change in appetite. Does c/o fatigue. Eyes: Denies redness, blurred vision, diplopia, discharge, itchy or watery eyes.  ENT: Denies discharge, congestion, post nasal drip, epistaxis, sore throat, earache, hearing loss, dental pain, Tinnitus, Vertigo, Sinus pain or snoring.  Cardio: Denies chest pain, palpitations, irregular heartbeat, syncope, dyspnea, diaphoresis, orthopnea, PND, claudication or edema Respiratory: denies cough, dyspnea, DOE, pleurisy, hoarseness, laryngitis or wheezing.  Gastrointestinal: Denies dysphagia, heartburn, reflux, water brash, pain, cramps, nausea, vomiting, bloating, diarrhea, constipation, hematemesis, melena, hematochezia, jaundice or hemorrhoids Genitourinary: Denies dysuria, frequency, urgency, nocturia, hesitancy, discharge, hematuria or flank pain Musculoskeletal: Denies arthralgia, myalgia, stiffness, Jt. Swelling, pain, limp or strain/sprain. Denies Falls/Fx's. Skin: Denies puritis, rash, hives, warts, acne, eczema or change in skin lesion Neuro: No weakness, tremor, incoordination, spasms, paresthesia or pain Psychiatric: Denies confusion, memory loss or sensory loss. Denies Depression/Mood swings. Endocrine: Denies change in weight, skin, hair change, nocturia, and paresthesia, diabetic polys, visual blurring or hyper / hypo glycemic episodes.  Heme/Lymph: No excessive bleeding, bruising or enlarged lymph nodes.  Physical Exam  BP 114/80 mmHg  Pulse 76  Temp(Src) 97.3 F (36.3 C)  Resp 16  Ht 4' 11.25" (1.505 m)  Wt 146 lb 3.2 oz (66.316 kg)  BMI 29.28 kg/m2  General Appearance: Well nourished, in no apparent distress. Sl masked facies.  Eyes: PERRLA, EOMs, conjunctiva no swelling or erythema, normal fundi and vessels. Sinuses: No frontal/maxillary tenderness ENT/Mouth: EACs patent / TMs  nl. Nares clear without  erythema, swelling, mucoid exudates. Oral hygiene is good. No erythema, swelling, or exudate. Tongue normal, non-obstructing. Tonsils not swollen or erythematous. Hearing normal.  Neck: Supple, thyroid normal. No bruits, nodes or JVD. Respiratory: Respiratory effort normal.  BS equal and clear bilateral without rales, rhonci, wheezing or stridor. Cardio: Heart sounds are normal with regular rate and rhythm and no murmurs, rubs or gallops. Peripheral pulses are normal and equal bilaterally without edema. No aortic or femoral bruits. Chest: symmetric with normal excursions and percussion.  Abdomen: Flat, soft, with bowl sounds. Nontender, no guarding, rebound, hernias, masses, or organomegaly.  Lymphatics: Non tender without lymphadenopathy.  Genitourinary: No hernias.Testes nl. DRE - prostate nl for age - smooth & firm w/o nodules. Musculoskeletal: Full ROM all peripheral extremities, joint stability, 5/5 strength and gait w/Nl stride & arm swing. No appreciable cog wheeling. Skin: Warm and dry without rashes, lesions, cyanosis, clubbing or  ecchymosis.  Neuro: Cranial nerves intact, reflexes equal bilaterally. Normal muscle tone, no cerebellar symptoms. Sensation intact. No tremor. Pysch: Alert and oriented X 3 with normal affect, insight and judgment appropriate.   Assessment and Plan  1. Essential hypertension  - [place], RETROPERITNL ABD,  LTD  2. Hyperlipidemia  - Lipid panel  3. Prediabetes  - Hemoglobin A1c -  Insulin, random  4. Vitamin D deficiency  - VITAMIN D 25 Hydroxy   5. Parkinson's disease ([place])   6. Gastroesophageal reflux disease   7. Screening for rectal cancer  - POC Hemoccult Bld/Stl   8. Depression screen   9. At moderate risk for fall   10. Other abnormal glucose  - Hemoglobin A1c - Insulin, random  11. Need for prophylactic vaccination against Streptococcus pneumoniae (pneumococcus)  - Pneumococcal conjugate vaccine 13-valent  12. Medication  management  - BASIC METABOLIC PANEL WITH GFR - Hepatic function panel   Continue prudent diet as discussed, weight control, BP monitoring, regular exercise, and medications as discussed.  Discussed med effects and SE's. Routine screening labs and tests as requested with regular follow-up as recommended. Over 40 minutes of exam, counseling, chart review and high complex critical decision making was performed

## 2015-11-10 NOTE — Patient Instructions (Signed)

## 2015-11-11 LAB — HEMOGLOBIN A1C
Hgb A1c MFr Bld: 5.8 % — ABNORMAL HIGH (ref <5.7)
MEAN PLASMA GLUCOSE: 120 mg/dL — AB (ref <117)

## 2015-11-11 LAB — INSULIN, RANDOM: INSULIN: 9.7 u[IU]/mL (ref 2.0–19.6)

## 2015-11-11 LAB — VITAMIN D 25 HYDROXY (VIT D DEFICIENCY, FRACTURES): Vit D, 25-Hydroxy: 42 ng/mL (ref 30–100)

## 2015-11-15 ENCOUNTER — Ambulatory Visit (INDEPENDENT_AMBULATORY_CARE_PROVIDER_SITE_OTHER): Payer: Medicare Other | Admitting: Family Medicine

## 2015-11-15 ENCOUNTER — Encounter: Payer: Self-pay | Admitting: Family Medicine

## 2015-11-15 ENCOUNTER — Other Ambulatory Visit (INDEPENDENT_AMBULATORY_CARE_PROVIDER_SITE_OTHER): Payer: Medicare Other

## 2015-11-15 VITALS — BP 108/64 | HR 89 | Ht 59.25 in | Wt 148.0 lb

## 2015-11-15 DIAGNOSIS — G20 Parkinson's disease: Secondary | ICD-10-CM | POA: Diagnosis not present

## 2015-11-15 DIAGNOSIS — M7062 Trochanteric bursitis, left hip: Secondary | ICD-10-CM

## 2015-11-15 DIAGNOSIS — M25552 Pain in left hip: Secondary | ICD-10-CM

## 2015-11-15 DIAGNOSIS — G20A1 Parkinson's disease without dyskinesia, without mention of fluctuations: Secondary | ICD-10-CM

## 2015-11-15 DIAGNOSIS — Z1231 Encounter for screening mammogram for malignant neoplasm of breast: Secondary | ICD-10-CM | POA: Diagnosis not present

## 2015-11-15 DIAGNOSIS — Z01419 Encounter for gynecological examination (general) (routine) without abnormal findings: Secondary | ICD-10-CM | POA: Diagnosis not present

## 2015-11-15 NOTE — Assessment & Plan Note (Signed)
Patient given injection today. Patient states that this is the one that seemed to tolerate the mass. Patient has had the sacroiliac joint injection previously with no significant improvement. Patient has had significant tightness on this lateral hip secondary to her change in gait. Patient is going to start doing the exercises again. Topical anti-inflammatories and continue the home therapy. Patient will come back and see me again in 4-6 weeks for further evaluation and treatment.

## 2015-11-15 NOTE — Patient Instructions (Signed)
God to see you Ice is your friend pennsaid pinkie amount topically 2 times daily as needed.  I like the soap Happy holidays!  See me when you need me.

## 2015-11-15 NOTE — [facility] Notes (Signed)
Pre visit review using our clinic review tool, if applicable. No additional management support is needed unless otherwise documented below in the visit note. 

## 2015-11-15 NOTE — Progress Notes (Signed)
[facility] [address] [place], [place] [address] Phone: [phone] Subjective:     CC: Back pain and hip pain follow up  [id number] Alexandra Lindsey is a 74 y.o. female coming in with complaint of back pain and hip pain.    Pertinent history: Patient has been followed by neurology for Parkinson's disease. Patient does have a known history of spinal stenosis. Patient did have a further workup recently with MRI of the lumbar spine and hip. These were reviewed by me today. Patient's MRI of the lumbar spine on 10/23/2014 showed spondylosis with degenerative disc disease with moderate impingement at L4-L5 and mild impingement at L5-S1.     Patient for her back has had epidural steroid injections in December 2014 as well as April 2015. Patient states the epidural steroid injection did not seem to be beneficial.   Patient states that the best response to a greater trochanteric injection of the fifth multiple months ago. Patient has been doing relatively well but over the course last several weeks he started having increasing pain. Describes it as a dull, throbbing aching sensation. Worse after sitting a long amount time or first steps in the morning. Patient feels that her steroid use his been very minimal and her Parkinson's has been under control. Patient states though that this pain is waking her up at night it is starting to affect her daily activities.  Past medical history, social, surgical and family history all reviewed in electronic medical record.   Review of Systems: No headache, visual changes, nausea, vomiting, diarrhea, constipation, dizziness, abdominal pain, skin rash, fevers, chills, night sweats, weight loss, swollen lymph nodes, body aches, joint swelling, muscle aches, chest pain, shortness of breath, mood changes.   Objective Blood pressure 108/64, pulse 89, height 4' 11.25" (1.505 m), weight 148 lb (67.132 kg), SpO2 98 %.  General: No  apparent distress alert and oriented x3 mood and affect normal, dressed appropriately.  HEENT: Pupils equal, extraocular movements intact  Respiratory: Patient's speak in full sentences and does not appear short of breath  Cardiovascular: No lower extremity edema, non tender, no erythema  Skin: Warm dry intact with no signs of infection or rash on extremities or on axial skeleton.  Abdomen: Soft nontender  Neuro: Cranial nerves II through XII are intact, neurovascularly intact in all extremities with 2+ DTRs and 2+ pulses.  Lymph: No lymphadenopathy of posterior or anterior cervical chain or axillae bilaterally.  Gait mild antalgic gait with some mild shuffling gait. This is different and previous exam. MSK:  Non tender with full range of motion and good stability and symmetric strength and tone of shoulders, elbows, wrist, hip, knee and ankles bilaterally.  Back Exam:  Inspection: Mild increase in kyphosis Motion: Flexion 35 deg, Extension 25 deg, Side Bending to 25 deg bilaterally,  Rotation to 25 deg bilaterally  SLR laying: Negative  XSLR laying: Negative  Palpable tenderness: Pain over the piriformis muscle noted on the left side. Severe pain over the greater trochanteric area. FABER: Mildly positive left side with some mild improvement Sensory change: Gross sensation intact to all lumbar and sacral dermatomes.  Reflexes: 2+ at both patellar tendons, 2+ at achilles tendons, Babinski's downgoing.  Strength at foot  Plantar-flexion: 5/5 Dorsi-flexion: 5/5 Eversion: 5/5 Inversion: 5/5  Leg strength  Quad: 5/5 Hamstring: 5/5 Hip flexor: 4/5 Hip abductors: 4/5  Gait unremarkable. Hip: left  ROM IR: 15 Deg, ER: 35 Deg, Flexion: 100 Deg, Extension: 100 Deg,  Abduction: 45 Deg, Adduction: 45 Deg Strength IR: 4/5, ER: 5/5, Flexion: 5/5, Extension: 5/5, Abduction: 4/5, Adduction: 4/5 Positive Fabe with continued diffuse tenderness of the greater trochanteric area worse than previous exam.    Pelvic alignment unremarkable to inspection and palpation. Standing hip rotation and gait without trendelenburg sign / unsteadiness.  MSK US performed of: Left This study was ordered, performed, and interpreted by Charlann Boxer D.O.  Hip: Trochanteric bursa with significant hypoechoic changes and swelling Acetabular labrum visualized and without tears, displacement, or effusion in joint. Femoral neck appears unremarkable without increased power doppler signal along Cortex.  IMPRESSION:  Greater trochanter bursitis   Procedure: Real-time Ultrasound Guided Injection of left greater trochanteric bursitis secondary to patient's body habitus Device: GE Logiq E  Ultrasound guided injection is preferred based studies that show increased duration, increased effect, greater accuracy, decreased procedural pain, increased response rate, and decreased cost with ultrasound guided versus blind injection.  Verbal informed consent obtained.  Time-out conducted.  Noted no overlying erythema, induration, or other signs of local infection.  Skin prepped in a sterile fashion.  Local anesthesia: Topical Ethyl chloride.  With sterile technique and under real time ultrasound guidance:  Greater trochanteric area was visualized and patient's bursa was noted. A 22-gauge 3 inch needle was inserted and 4 cc of 0.5% Marcaine and 1 cc of Kenalog 40 mg/dL was injected. Pictures taken Completed without difficulty  Pain immediately resolved suggesting accurate placement of the medication.  Advised to call if fevers/chills, erythema, induration, drainage, or persistent bleeding.  Images permanently stored and available for review in the ultrasound unit.  Impression: Technically successful ultrasound guided injection.        Impression and Recommendations:     This case required medical decision making of moderate complexity.

## 2015-11-15 NOTE — Assessment & Plan Note (Signed)
Mild shuffling gait noted today. Hopefully this is secondary to pain. This will make treatment for this work comp located because patient cannot do the physical therapy a regular basis.

## 2015-12-01 ENCOUNTER — Other Ambulatory Visit: Payer: Self-pay | Admitting: *Deleted

## 2015-12-01 DIAGNOSIS — M5416 Radiculopathy, lumbar region: Secondary | ICD-10-CM

## 2015-12-06 ENCOUNTER — Other Ambulatory Visit: Payer: Self-pay | Admitting: *Deleted

## 2015-12-06 DIAGNOSIS — Z1212 Encounter for screening for malignant neoplasm of rectum: Secondary | ICD-10-CM

## 2015-12-06 LAB — POC HEMOCCULT BLD/STL (HOME/3-CARD/SCREEN)
FECAL OCCULT BLD: NEGATIVE
FECAL OCCULT BLD: NEGATIVE
FECAL OCCULT BLD: NEGATIVE

## 2015-12-14 ENCOUNTER — Other Ambulatory Visit: Payer: Self-pay | Admitting: Neurosurgery

## 2015-12-14 DIAGNOSIS — M5136 Other intervertebral disc degeneration, lumbar region: Secondary | ICD-10-CM | POA: Diagnosis not present

## 2015-12-14 DIAGNOSIS — M546 Pain in thoracic spine: Secondary | ICD-10-CM | POA: Diagnosis not present

## 2015-12-14 DIAGNOSIS — M549 Dorsalgia, unspecified: Secondary | ICD-10-CM | POA: Diagnosis not present

## 2015-12-14 DIAGNOSIS — M4316 Spondylolisthesis, lumbar region: Secondary | ICD-10-CM | POA: Diagnosis not present

## 2015-12-14 DIAGNOSIS — M7138 Other bursal cyst, other site: Secondary | ICD-10-CM | POA: Diagnosis not present

## 2015-12-14 DIAGNOSIS — M4806 Spinal stenosis, lumbar region: Secondary | ICD-10-CM | POA: Diagnosis not present

## 2015-12-14 DIAGNOSIS — M4726 Other spondylosis with radiculopathy, lumbar region: Secondary | ICD-10-CM | POA: Diagnosis not present

## 2015-12-14 DIAGNOSIS — M4155 Other secondary scoliosis, thoracolumbar region: Secondary | ICD-10-CM | POA: Diagnosis not present

## 2015-12-14 DIAGNOSIS — Z6828 Body mass index (BMI) 28.0-28.9, adult: Secondary | ICD-10-CM | POA: Diagnosis not present

## 2015-12-15 DIAGNOSIS — M4316 Spondylolisthesis, lumbar region: Secondary | ICD-10-CM | POA: Diagnosis not present

## 2015-12-15 DIAGNOSIS — M4806 Spinal stenosis, lumbar region: Secondary | ICD-10-CM | POA: Diagnosis not present

## 2015-12-16 ENCOUNTER — Other Ambulatory Visit (HOSPITAL_COMMUNITY): Payer: Self-pay | Admitting: *Deleted

## 2015-12-16 ENCOUNTER — Encounter (HOSPITAL_COMMUNITY): Payer: Self-pay

## 2015-12-16 ENCOUNTER — Encounter (HOSPITAL_COMMUNITY)
Admission: RE | Admit: 2015-12-16 | Discharge: 2015-12-16 | Disposition: A | Discharge status: Home / Self Care | Payer: Medicare Other | Source: Ambulatory Visit | Attending: Neurosurgery | Admitting: Neurosurgery

## 2015-12-16 DIAGNOSIS — Z01812 Encounter for preprocedural laboratory examination: Secondary | ICD-10-CM | POA: Insufficient documentation

## 2015-12-16 DIAGNOSIS — R7303 Prediabetes: Secondary | ICD-10-CM | POA: Insufficient documentation

## 2015-12-16 DIAGNOSIS — E785 Hyperlipidemia, unspecified: Secondary | ICD-10-CM | POA: Insufficient documentation

## 2015-12-16 DIAGNOSIS — Z0183 Encounter for blood typing: Secondary | ICD-10-CM | POA: Insufficient documentation

## 2015-12-16 DIAGNOSIS — M4806 Spinal stenosis, lumbar region: Secondary | ICD-10-CM | POA: Diagnosis not present

## 2015-12-16 DIAGNOSIS — K219 Gastro-esophageal reflux disease without esophagitis: Secondary | ICD-10-CM | POA: Insufficient documentation

## 2015-12-16 DIAGNOSIS — M5136 Other intervertebral disc degeneration, lumbar region: Secondary | ICD-10-CM | POA: Diagnosis not present

## 2015-12-16 DIAGNOSIS — M7138 Other bursal cyst, other site: Secondary | ICD-10-CM | POA: Diagnosis not present

## 2015-12-16 DIAGNOSIS — Z01818 Encounter for other preprocedural examination: Secondary | ICD-10-CM | POA: Insufficient documentation

## 2015-12-16 DIAGNOSIS — Z79899 Other long term (current) drug therapy: Secondary | ICD-10-CM | POA: Diagnosis not present

## 2015-12-16 DIAGNOSIS — M4316 Spondylolisthesis, lumbar region: Secondary | ICD-10-CM | POA: Diagnosis not present

## 2015-12-16 DIAGNOSIS — G20 Parkinson's disease: Secondary | ICD-10-CM | POA: Insufficient documentation

## 2015-12-16 DIAGNOSIS — M4155 Other secondary scoliosis, thoracolumbar region: Secondary | ICD-10-CM | POA: Diagnosis not present

## 2015-12-16 DIAGNOSIS — M4726 Other spondylosis with radiculopathy, lumbar region: Secondary | ICD-10-CM | POA: Diagnosis not present

## 2015-12-16 HISTORY — DX: Headache: R51

## 2015-12-16 HISTORY — DX: Constipation, unspecified: K59.00

## 2015-12-16 HISTORY — DX: Headache, unspecified: R51.9

## 2015-12-16 HISTORY — DX: Chest pain, unspecified: R07.9

## 2015-12-16 HISTORY — DX: Personal history of other diseases of the digestive system: Z87.19

## 2015-12-16 HISTORY — DX: Unspecified osteoarthritis, unspecified site: M19.90

## 2015-12-16 HISTORY — DX: Anemia, unspecified: D64.9

## 2015-12-16 LAB — BASIC METABOLIC PANEL
Anion gap: 7 (ref 5–15)
BUN: 25 mg/dL — AB (ref 6–20)
CHLORIDE: 109 mmol/L (ref 101–111)
CO2: 28 mmol/L (ref 22–32)
CREATININE: 1.16 mg/dL — AB (ref 0.44–1.00)
Calcium: 9.4 mg/dL (ref 8.9–10.3)
GFR calc non Af Amer: 46 mL/min — ABNORMAL LOW (ref >60)
GFR, EST AFRICAN AMERICAN: 53 mL/min — AB (ref >60)
Glucose, Bld: 101 mg/dL — ABNORMAL HIGH (ref 65–99)
Potassium: 4.4 mmol/L (ref 3.5–5.1)
Sodium: 144 mmol/L (ref 135–145)

## 2015-12-16 LAB — CBC
HCT: 39.9 % (ref 36.0–46.0)
Hemoglobin: 12.7 g/dL (ref 12.0–15.0)
MCH: 30.7 pg (ref 26.0–34.0)
MCHC: 31.8 g/dL (ref 30.0–36.0)
MCV: 96.4 fL (ref 78.0–100.0)
PLATELETS: 275 10*3/uL (ref 150–400)
RBC: 4.14 MIL/uL (ref 3.87–5.11)
RDW: 12.5 % (ref 11.5–15.5)
WBC: 5.8 10*3/uL (ref 4.0–10.5)

## 2015-12-16 LAB — ABO/RH: ABO/RH(D): A NEG

## 2015-12-16 LAB — TYPE AND SCREEN
ABO/RH(D): A NEG
Antibody Screen: NEGATIVE

## 2015-12-16 LAB — SURGICAL PCR SCREEN
MRSA, PCR: NEGATIVE
Staphylococcus aureus: NEGATIVE

## 2015-12-16 NOTE — Progress Notes (Signed)
Pt denies cardiac history though she states she has chest pain "a lot". States she's been worked up for it in the past and they never could find the reason, so she states she doesn't get it checked out now. She describes the pain as a piercing type pain that will radiate across chest and sometimes up into her jaw. She denies nausea or diaphoresis with this pain and states it usually only lasts for about 15 min. I asked her if she took any medication for it and she stated she sometimes would take 2 Aspirins and sit down and the pain goes away. She states her PCP is aware that she still has the pain but she never goes to the ED when she has it because "I know they're not going to do anything". She states the doctors seem to think it might be related to her Parkinson's.

## 2015-12-16 NOTE — Pre-Procedure Instructions (Signed)
Alexandra Lindsey  12/16/2015     Your procedure is scheduled on Monday, December 20, 2015 at 11:30 AM.   Report to [facility] Entrance "A" Admitting Office at 8:30 AM.   Call this number if you have problems the morning of surgery: [phone]   Remember:  Do not eat food or drink liquids after midnight Sunday, 12/19/15.  Take these medicines the morning of surgery with A SIP OF WATER: Carbidopa-Levodopa (Sinemet IR), Pramipexole (Mirapex), Ranitidine (Zantac) - if needed.  Stop NSAIDS (Ibuprofen, Aleve, etc.) as of today.   Do not wear jewelry, make-up or nail polish.  Do not wear lotions, powders, or perfumes.  You may wear deodorant.  Do not shave 48 hours prior to surgery.    Do not bring valuables to the hospital.  [facility] is not responsible for any belongings or valuables.  Contacts, dentures or bridgework may not be worn into surgery.  Leave your suitcase in the car.  After surgery it may be brought to your room.  For patients admitted to the hospital, discharge time will be determined by your treatment team.  Special instructions:  [facility] - Preparing for Surgery  Before surgery, you can play an important role.  Because skin is not sterile, your skin needs to be as free of germs as possible.  You can reduce the number of germs on you skin by washing with CHG (chlorahexidine gluconate) soap before surgery.  CHG is an antiseptic cleaner which kills germs and bonds with the skin to continue killing germs even after washing.  Please DO NOT use if you have an allergy to CHG or antibacterial soaps.  If your skin becomes reddened/irritated stop using the CHG and inform your nurse when you arrive at Short Stay.  Do not shave (including legs and underarms) for at least 48 hours prior to the first CHG shower.  You may shave your face.  Please follow these instructions carefully:   1.  Shower with CHG Soap the night before surgery and the                                 morning of Surgery.  2.  If you choose to wash your hair, wash your hair first as usual with your       normal shampoo.  3.  After you shampoo, rinse your hair and body thoroughly to remove the                      Shampoo.  4.  Use CHG as you would any other liquid soap.  You can apply chg directly       to the skin and wash gently with scrungie or a clean washcloth.  5.  Apply the CHG Soap to your body ONLY FROM THE NECK DOWN.        Do not use on open wounds or open sores.  Avoid contact with your eyes, ears, mouth and genitals (private parts).  Wash genitals (private parts) with your normal soap.  6.  Wash thoroughly, paying special attention to the area where your surgery        will be performed.  7.  Thoroughly rinse your body with warm water from the neck down.  8.  DO NOT shower/wash with your normal soap after using and rinsing off       the CHG Soap.  9.  Pat yourself dry with a clean towel.            10.  Wear clean pajamas.            11.  Place clean sheets on your bed the night of your first shower and do not        sleep with pets.  Day of Surgery  Do not apply any lotions the morning of surgery.  Please wear clean clothes to the hospital.   Please read over the following fact sheets that you were given. Pain Booklet, Coughing and Deep Breathing, Blood Transfusion Information, MRSA Information and Surgical Site Infection Prevention

## 2015-12-17 NOTE — Progress Notes (Signed)
Anesthesia Chart Review:  Pt is 75 year old female scheduled for L3-4, L4-5 decompression with PLIF on 12/20/2015 with Dr. Sherwood Gambler.   PMH includes:  Parkinson's disease, prediabetes, hyperlipidemia, REM behavior disorder, GERD. Never smoker. BMI 29.  Medications include: carbidopa-levodopa, mirapex, zantac.   Preoperative labs reviewed.    EKG 10/03/15: sinus rhythm  Cardiac cath 07/03/12:  - Normal epicardial coronary arteries. - Normal LV function  Pt reported at PAT she has chest pain "a lot". States she's been worked up for it in the past and they never could find the reason, so she states she doesn't get it checked out now. She describes the pain as a piercing type pain that will radiate across chest and sometimes up into her jaw. She denies nausea or diaphoresis with this pain and states it usually only lasts for about 15 min. She states the doctors seem to think it might be related to her Parkinson's. Given completely normal coronaries in 2013 and no change in CP symptoms over many years, it is unlikely CP is cardiac in nature.   If no changes, I anticipate pt can proceed with surgery as scheduled.   Willeen Cass, FNP-BC [facility]iology Phone: [phone] 12/17/2015 9:52 AM

## 2015-12-19 MED ORDER — VANCOMYCIN HCL IN DEXTROSE 1-5 GM/200ML-% IV SOLN
1000.0000 mg | INTRAVENOUS | Status: AC
  Administered 2015-12-20: 1000 mg via INTRAVENOUS
  Filled 2015-12-19: qty 200

## 2015-12-20 ENCOUNTER — Encounter (HOSPITAL_COMMUNITY): Payer: Self-pay | Admitting: *Deleted

## 2015-12-20 ENCOUNTER — Encounter (HOSPITAL_COMMUNITY)
Admission: RE | Disposition: A | Discharge status: Home / Self Care | Payer: Medicare Other | Source: Ambulatory Visit | Attending: Neurosurgery

## 2015-12-20 ENCOUNTER — Inpatient Hospital Stay (HOSPITAL_COMMUNITY): Payer: Medicare Other

## 2015-12-20 ENCOUNTER — Inpatient Hospital Stay (HOSPITAL_COMMUNITY): Payer: Medicare Other | Admitting: Anesthesiology

## 2015-12-20 ENCOUNTER — Inpatient Hospital Stay (HOSPITAL_COMMUNITY): Payer: Medicare Other | Admitting: Emergency Medicine

## 2015-12-20 ENCOUNTER — Inpatient Hospital Stay (HOSPITAL_COMMUNITY)
Admission: RE | Admit: 2015-12-20 | Discharge: 2015-12-22 | DRG: 460 | Disposition: A | Discharge status: Home / Self Care | Payer: Medicare Other | Source: Ambulatory Visit | Attending: Neurosurgery | Admitting: Neurosurgery

## 2015-12-20 DIAGNOSIS — M5116 Intervertebral disc disorders with radiculopathy, lumbar region: Secondary | ICD-10-CM | POA: Diagnosis not present

## 2015-12-20 DIAGNOSIS — E559 Vitamin D deficiency, unspecified: Secondary | ICD-10-CM | POA: Diagnosis present

## 2015-12-20 DIAGNOSIS — R7303 Prediabetes: Secondary | ICD-10-CM | POA: Diagnosis present

## 2015-12-20 DIAGNOSIS — G20 Parkinson's disease: Secondary | ICD-10-CM | POA: Diagnosis present

## 2015-12-20 DIAGNOSIS — M545 Low back pain: Secondary | ICD-10-CM | POA: Diagnosis not present

## 2015-12-20 DIAGNOSIS — Z79899 Other long term (current) drug therapy: Secondary | ICD-10-CM | POA: Diagnosis not present

## 2015-12-20 DIAGNOSIS — K219 Gastro-esophageal reflux disease without esophagitis: Secondary | ICD-10-CM | POA: Diagnosis present

## 2015-12-20 DIAGNOSIS — E785 Hyperlipidemia, unspecified: Secondary | ICD-10-CM | POA: Diagnosis present

## 2015-12-20 DIAGNOSIS — M4326 Fusion of spine, lumbar region: Secondary | ICD-10-CM | POA: Diagnosis not present

## 2015-12-20 DIAGNOSIS — M4726 Other spondylosis with radiculopathy, lumbar region: Secondary | ICD-10-CM | POA: Diagnosis not present

## 2015-12-20 DIAGNOSIS — M5416 Radiculopathy, lumbar region: Secondary | ICD-10-CM | POA: Diagnosis not present

## 2015-12-20 DIAGNOSIS — I10 Essential (primary) hypertension: Secondary | ICD-10-CM | POA: Diagnosis present

## 2015-12-20 DIAGNOSIS — Z9889 Other specified postprocedural states: Secondary | ICD-10-CM | POA: Diagnosis not present

## 2015-12-20 DIAGNOSIS — M48062 Spinal stenosis, lumbar region with neurogenic claudication: Secondary | ICD-10-CM | POA: Diagnosis present

## 2015-12-20 DIAGNOSIS — M4316 Spondylolisthesis, lumbar region: Secondary | ICD-10-CM | POA: Diagnosis not present

## 2015-12-20 DIAGNOSIS — M7138 Other bursal cyst, other site: Secondary | ICD-10-CM | POA: Diagnosis present

## 2015-12-20 DIAGNOSIS — Z419 Encounter for procedure for purposes other than remedying health state, unspecified: Secondary | ICD-10-CM

## 2015-12-20 DIAGNOSIS — M1990 Unspecified osteoarthritis, unspecified site: Secondary | ICD-10-CM | POA: Diagnosis not present

## 2015-12-20 DIAGNOSIS — M4806 Spinal stenosis, lumbar region: Secondary | ICD-10-CM | POA: Diagnosis not present

## 2015-12-20 HISTORY — PX: BACK SURGERY: SHX140

## 2015-12-20 LAB — URINALYSIS, ROUTINE W REFLEX MICROSCOPIC
Bilirubin Urine: NEGATIVE
Glucose, UA: NEGATIVE mg/dL
Hgb urine dipstick: NEGATIVE
Ketones, ur: NEGATIVE mg/dL
Leukocytes, UA: NEGATIVE
Nitrite: NEGATIVE
Protein, ur: NEGATIVE mg/dL
Specific Gravity, Urine: 1.018 (ref 1.005–1.030)
pH: 5.5 (ref 5.0–8.0)

## 2015-12-20 SURGERY — POSTERIOR LUMBAR FUSION 2 LEVEL
Anesthesia: General | Site: Back

## 2015-12-20 MED ORDER — SUGAMMADEX SODIUM 200 MG/2ML IV SOLN
INTRAVENOUS | Status: DC | PRN
  Administered 2015-12-20: 135 mg via INTRAVENOUS

## 2015-12-20 MED ORDER — ONDANSETRON HCL 4 MG/2ML IJ SOLN
4.0000 mg | Freq: Four times a day (QID) | INTRAMUSCULAR | Status: DC | PRN
  Administered 2015-12-20: 4 mg via INTRAVENOUS
  Filled 2015-12-20: qty 2

## 2015-12-20 MED ORDER — MIDAZOLAM HCL 5 MG/5ML IJ SOLN
INTRAMUSCULAR | Status: DC | PRN
  Administered 2015-12-20 (×2): 1 mg via INTRAVENOUS

## 2015-12-20 MED ORDER — PRAMIPEXOLE DIHYDROCHLORIDE 1 MG PO TABS
1.0000 mg | ORAL_TABLET | Freq: Three times a day (TID) | ORAL | Status: DC
  Administered 2015-12-20 – 2015-12-21 (×4): 1 mg via ORAL
  Filled 2015-12-20 (×7): qty 1

## 2015-12-20 MED ORDER — MAGNESIUM OXIDE 400 (241.3 MG) MG PO TABS
400.0000 mg | ORAL_TABLET | Freq: Every day | ORAL | Status: DC
  Administered 2015-12-21: 400 mg via ORAL
  Filled 2015-12-20 (×2): qty 1

## 2015-12-20 MED ORDER — LIDOCAINE HCL (CARDIAC) 20 MG/ML IV SOLN
INTRAVENOUS | Status: DC | PRN
  Administered 2015-12-20: 80 mg via INTRAVENOUS

## 2015-12-20 MED ORDER — BISACODYL 10 MG RE SUPP: 10.0000 mg | Freq: Every day | RECTAL | Status: DC | PRN

## 2015-12-20 MED ORDER — ONDANSETRON HCL 4 MG PO TABS: 4.0000 mg | ORAL_TABLET | Freq: Four times a day (QID) | ORAL | Status: DC | PRN

## 2015-12-20 MED ORDER — MAGNESIUM HYDROXIDE 400 MG/5ML PO SUSP
30.0000 mL | Freq: Every day | ORAL | Status: DC | PRN
Start: 2015-12-20 — End: 2015-12-22

## 2015-12-20 MED ORDER — HYDROCODONE-ACETAMINOPHEN 5-325 MG PO TABS
1.0000 | ORAL_TABLET | ORAL | Status: DC | PRN
  Administered 2015-12-22: 2 via ORAL
  Filled 2015-12-20: qty 2

## 2015-12-20 MED ORDER — STERILE WATER FOR INJECTION IJ SOLN
INTRAMUSCULAR | Status: AC
  Filled 2015-12-20: qty 10

## 2015-12-20 MED ORDER — CARBIDOPA-LEVODOPA 25-100 MG PO TABS
1.0000 | ORAL_TABLET | Freq: Every day | ORAL | Status: DC
  Administered 2015-12-20 – 2015-12-22 (×9): 1 via ORAL
  Filled 2015-12-20 (×15): qty 1

## 2015-12-20 MED ORDER — 0.9 % SODIUM CHLORIDE (POUR BTL) OPTIME
TOPICAL | Status: DC | PRN
  Administered 2015-12-20 (×2): 1000 mL

## 2015-12-20 MED ORDER — LACTATED RINGERS IV SOLN
INTRAVENOUS | Status: DC | PRN
  Administered 2015-12-20 (×4): via INTRAVENOUS

## 2015-12-20 MED ORDER — HYDROXYZINE HCL 50 MG/ML IM SOLN: 50.0000 mg | INTRAMUSCULAR | Status: DC | PRN

## 2015-12-20 MED ORDER — LIDOCAINE-EPINEPHRINE 1 %-1:100000 IJ SOLN
INTRAMUSCULAR | Status: DC | PRN
  Administered 2015-12-20: 50 mL

## 2015-12-20 MED ORDER — FENTANYL CITRATE (PF) 100 MCG/2ML IJ SOLN
INTRAMUSCULAR | Status: DC | PRN
  Administered 2015-12-20 (×2): 50 ug via INTRAVENOUS
  Administered 2015-12-20: 100 ug via INTRAVENOUS
  Administered 2015-12-20 (×4): 50 ug via INTRAVENOUS

## 2015-12-20 MED ORDER — VECURONIUM BROMIDE 10 MG IV SOLR
INTRAVENOUS | Status: DC | PRN
  Administered 2015-12-20 (×2): 2 mg via INTRAVENOUS
  Administered 2015-12-20: 1 mg via INTRAVENOUS
  Administered 2015-12-20: 3 mg via INTRAVENOUS
  Administered 2015-12-20: 1 mg via INTRAVENOUS

## 2015-12-20 MED ORDER — KETOROLAC TROMETHAMINE 15 MG/ML IJ SOLN
15.0000 mg | Freq: Four times a day (QID) | INTRAMUSCULAR | Status: DC
  Administered 2015-12-20 – 2015-12-21 (×5): 15 mg via INTRAVENOUS
  Filled 2015-12-20 (×4): qty 1

## 2015-12-20 MED ORDER — SODIUM CHLORIDE 0.9 % IV SOLN: 250.0000 mL | INTRAVENOUS | Status: DC

## 2015-12-20 MED ORDER — ACETAMINOPHEN 325 MG PO TABS: 650.0000 mg | ORAL_TABLET | ORAL | Status: DC | PRN

## 2015-12-20 MED ORDER — DEXAMETHASONE SODIUM PHOSPHATE 10 MG/ML IJ SOLN
INTRAMUSCULAR | Status: AC
  Filled 2015-12-20: qty 1

## 2015-12-20 MED ORDER — PHENOL 1.4 % MT LIQD
1.0000 | OROMUCOSAL | Status: DC | PRN
Start: 2015-12-20 — End: 2015-12-22

## 2015-12-20 MED ORDER — VITAMIN D3 25 MCG (1000 UNIT) PO TABS
5000.0000 [IU] | ORAL_TABLET | Freq: Every day | ORAL | Status: DC
  Administered 2015-12-21: 5000 [IU] via ORAL
  Filled 2015-12-20 (×3): qty 5

## 2015-12-20 MED ORDER — ARTIFICIAL TEARS OP OINT
TOPICAL_OINTMENT | OPHTHALMIC | Status: DC | PRN
  Administered 2015-12-20: 1 via OPHTHALMIC

## 2015-12-20 MED ORDER — SUGAMMADEX SODIUM 200 MG/2ML IV SOLN
INTRAVENOUS | Status: AC
  Filled 2015-12-20: qty 2

## 2015-12-20 MED ORDER — SODIUM CHLORIDE 0.9 % IR SOLN
Status: DC | PRN
  Administered 2015-12-20: 15:00:00

## 2015-12-20 MED ORDER — EPHEDRINE SULFATE 50 MG/ML IJ SOLN
INTRAMUSCULAR | Status: DC | PRN
  Administered 2015-12-20: 5 mg via INTRAVENOUS

## 2015-12-20 MED ORDER — ONDANSETRON HCL 4 MG/2ML IJ SOLN
INTRAMUSCULAR | Status: AC
  Filled 2015-12-20: qty 2

## 2015-12-20 MED ORDER — PHENYLEPHRINE HCL 10 MG/ML IJ SOLN
INTRAMUSCULAR | Status: DC | PRN
  Administered 2015-12-20 (×3): 40 ug via INTRAVENOUS
  Administered 2015-12-20 (×3): 80 ug via INTRAVENOUS
  Administered 2015-12-20: 40 ug via INTRAVENOUS
  Administered 2015-12-20 (×3): 80 ug via INTRAVENOUS
  Administered 2015-12-20: 20 ug via INTRAVENOUS

## 2015-12-20 MED ORDER — DEXTROSE-NACL 5-0.45 % IV SOLN
INTRAVENOUS | Status: DC
  Administered 2015-12-21: 13:00:00 via INTRAVENOUS

## 2015-12-20 MED ORDER — PROPOFOL 10 MG/ML IV BOLUS
INTRAVENOUS | Status: DC | PRN
  Administered 2015-12-20: 150 mg via INTRAVENOUS

## 2015-12-20 MED ORDER — SODIUM CHLORIDE 0.9 % IJ SOLN
3.0000 mL | Freq: Two times a day (BID) | INTRAMUSCULAR | Status: DC
  Administered 2015-12-21: 3 mL via INTRAVENOUS

## 2015-12-20 MED ORDER — HYDROXYZINE HCL 50 MG PO TABS
50.0000 mg | ORAL_TABLET | ORAL | Status: DC | PRN
  Filled 2015-12-20: qty 1

## 2015-12-20 MED ORDER — OXYCODONE-ACETAMINOPHEN 5-325 MG PO TABS
1.0000 | ORAL_TABLET | ORAL | Status: DC | PRN
  Administered 2015-12-20 – 2015-12-21 (×5): 2 via ORAL
  Administered 2015-12-22: 1 via ORAL
  Administered 2015-12-22: 2 via ORAL
  Filled 2015-12-20 (×6): qty 2
  Filled 2015-12-20: qty 1

## 2015-12-20 MED ORDER — KETOROLAC TROMETHAMINE 30 MG/ML IJ SOLN: 30.0000 mg | Freq: Four times a day (QID) | INTRAMUSCULAR | Status: DC

## 2015-12-20 MED ORDER — THROMBIN 20000 UNITS EX SOLR
CUTANEOUS | Status: DC | PRN
  Administered 2015-12-20 (×2): via TOPICAL

## 2015-12-20 MED ORDER — DEXAMETHASONE SODIUM PHOSPHATE 10 MG/ML IJ SOLN
INTRAMUSCULAR | Status: DC | PRN
  Administered 2015-12-20: 10 mg via INTRAVENOUS

## 2015-12-20 MED ORDER — HEMOSTATIC AGENTS (NO CHARGE) OPTIME
TOPICAL | Status: DC | PRN
  Administered 2015-12-20: 1 via TOPICAL

## 2015-12-20 MED ORDER — PHENYLEPHRINE 40 MCG/ML (10ML) SYRINGE FOR IV PUSH (FOR BLOOD PRESSURE SUPPORT)
PREFILLED_SYRINGE | INTRAVENOUS | Status: AC
  Filled 2015-12-20: qty 10

## 2015-12-20 MED ORDER — KETOROLAC TROMETHAMINE 30 MG/ML IJ SOLN
INTRAMUSCULAR | Status: AC
  Filled 2015-12-20: qty 1

## 2015-12-20 MED ORDER — HYDROMORPHONE HCL 1 MG/ML IJ SOLN
INTRAMUSCULAR | Status: AC
  Administered 2015-12-20: 0.5 mg via INTRAVENOUS
  Filled 2015-12-20: qty 1

## 2015-12-20 MED ORDER — FENTANYL CITRATE (PF) 250 MCG/5ML IJ SOLN
INTRAMUSCULAR | Status: AC
  Filled 2015-12-20: qty 5

## 2015-12-20 MED ORDER — LACTATED RINGERS IV SOLN
INTRAVENOUS | Status: DC
  Administered 2015-12-20: 09:00:00 via INTRAVENOUS

## 2015-12-20 MED ORDER — GENTAMICIN IN SALINE 1.6-0.9 MG/ML-% IV SOLN
80.0000 mg | Freq: Once | INTRAVENOUS | Status: AC
  Administered 2015-12-20: 80 mg via INTRAVENOUS
  Filled 2015-12-20: qty 50

## 2015-12-20 MED ORDER — ALBUMIN HUMAN 5 % IV SOLN
INTRAVENOUS | Status: DC | PRN
  Administered 2015-12-20: 16:00:00 via INTRAVENOUS

## 2015-12-20 MED ORDER — MIDAZOLAM HCL 2 MG/2ML IJ SOLN
INTRAMUSCULAR | Status: AC
  Filled 2015-12-20: qty 2

## 2015-12-20 MED ORDER — ACETAMINOPHEN 650 MG RE SUPP: 650.0000 mg | RECTAL | Status: DC | PRN

## 2015-12-20 MED ORDER — ONDANSETRON HCL 4 MG/2ML IJ SOLN
INTRAMUSCULAR | Status: DC | PRN
  Administered 2015-12-20: 4 mg via INTRAVENOUS

## 2015-12-20 MED ORDER — CARBIDOPA-LEVODOPA 25-100 MG PO TABS
1.0000 | ORAL_TABLET | ORAL | Status: AC
  Administered 2015-12-20: 1 via ORAL
  Filled 2015-12-20: qty 1

## 2015-12-20 MED ORDER — MORPHINE SULFATE (PF) 4 MG/ML IV SOLN: 4.0000 mg | INTRAVENOUS | Status: DC | PRN

## 2015-12-20 MED ORDER — MENTHOL 3 MG MT LOZG: 1.0000 | LOZENGE | OROMUCOSAL | Status: DC | PRN

## 2015-12-20 MED ORDER — CYANOCOBALAMIN 500 MCG PO TABS
500.0000 ug | ORAL_TABLET | Freq: Every day | ORAL | Status: DC
  Administered 2015-12-21: 500 ug via ORAL
  Filled 2015-12-20 (×3): qty 1

## 2015-12-20 MED ORDER — SODIUM CHLORIDE 0.9 % IJ SOLN: 3.0000 mL | INTRAMUSCULAR | Status: DC | PRN

## 2015-12-20 MED ORDER — VECURONIUM BROMIDE 10 MG IV SOLR
INTRAVENOUS | Status: AC
  Filled 2015-12-20: qty 10

## 2015-12-20 MED ORDER — ALUM & MAG HYDROXIDE-SIMETH 200-200-20 MG/5ML PO SUSP: 30.0000 mL | Freq: Four times a day (QID) | ORAL | Status: DC | PRN

## 2015-12-20 MED ORDER — CYCLOBENZAPRINE HCL 10 MG PO TABS
10.0000 mg | ORAL_TABLET | Freq: Three times a day (TID) | ORAL | Status: DC | PRN
  Administered 2015-12-20 – 2015-12-22 (×3): 10 mg via ORAL
  Filled 2015-12-20 (×3): qty 1

## 2015-12-20 MED ORDER — THROMBIN 5000 UNITS EX SOLR
CUTANEOUS | Status: DC | PRN
  Administered 2015-12-20 (×2): via TOPICAL

## 2015-12-20 MED ORDER — KETOROLAC TROMETHAMINE 30 MG/ML IJ SOLN
30.0000 mg | Freq: Once | INTRAMUSCULAR | Status: AC
  Administered 2015-12-20: 30 mg via INTRAVENOUS

## 2015-12-20 MED ORDER — ROCURONIUM BROMIDE 50 MG/5ML IV SOLN
INTRAVENOUS | Status: AC
  Filled 2015-12-20: qty 1

## 2015-12-20 MED ORDER — LACTATED RINGERS IV SOLN: INTRAVENOUS | Status: DC

## 2015-12-20 MED ORDER — BUPIVACAINE HCL (PF) 0.5 % IJ SOLN
INTRAMUSCULAR | Status: DC | PRN
  Administered 2015-12-20: 50 mL

## 2015-12-20 MED ORDER — ROCURONIUM BROMIDE 100 MG/10ML IV SOLN
INTRAVENOUS | Status: DC | PRN
  Administered 2015-12-20: 50 mg via INTRAVENOUS

## 2015-12-20 MED ORDER — ACETAMINOPHEN 10 MG/ML IV SOLN
INTRAVENOUS | Status: AC
  Administered 2015-12-20: 1000 mg via INTRAVENOUS
  Filled 2015-12-20: qty 100

## 2015-12-20 MED ORDER — PROPOFOL 10 MG/ML IV BOLUS
INTRAVENOUS | Status: AC
  Filled 2015-12-20: qty 20

## 2015-12-20 MED ORDER — HYDROMORPHONE HCL 1 MG/ML IJ SOLN
0.2500 mg | INTRAMUSCULAR | Status: DC | PRN
  Administered 2015-12-20 (×2): 0.5 mg via INTRAVENOUS

## 2015-12-20 SURGICAL SUPPLY — 81 items
ADH SKN CLS APL DERMABOND .7 (GAUZE/BANDAGES/DRESSINGS) ×1
APL SKNCLS STERI-STRIP NONHPOA (GAUZE/BANDAGES/DRESSINGS)
APL SRG 60D 8 XTD TIP BNDBL (TIP) ×1
BAG DECANTER FOR FLEXI CONT (MISCELLANEOUS) ×2 IMPLANT
BENZOIN TINCTURE PRP APPL 2/3 (GAUZE/BANDAGES/DRESSINGS) IMPLANT
BLADE CLIPPER SURG (BLADE) IMPLANT
BRUSH SCRUB EZ PLAIN DRY (MISCELLANEOUS) ×2 IMPLANT
BUR ACRON 5.0MM COATED (BURR) ×2 IMPLANT
BUR MATCHSTICK NEURO 3.0 LAGG (BURR) ×2 IMPLANT
CANISTER SUCT 3000ML PPV (MISCELLANEOUS) ×2 IMPLANT
CAP LCK SPNE (Orthopedic Implant) ×5 IMPLANT
CAP LOCK SPINE RADIUS (Orthopedic Implant) IMPLANT
CAP LOCKING (Orthopedic Implant) ×10 IMPLANT
CONT SPEC 4OZ CLIKSEAL STRL BL (MISCELLANEOUS) ×2 IMPLANT
COVER BACK TABLE 60X90IN (DRAPES) ×3 IMPLANT
DERMABOND ADVANCED (GAUZE/BANDAGES/DRESSINGS) ×1
DERMABOND ADVANCED .7 DNX12 (GAUZE/BANDAGES/DRESSINGS) ×2 IMPLANT
DRAPE C-ARM 42X72 X-RAY (DRAPES) ×5 IMPLANT
DRAPE LAPAROTOMY 100X72X124 (DRAPES) ×2 IMPLANT
DRAPE MICROSCOPE LEICA (MISCELLANEOUS) ×1 IMPLANT
DRAPE POUCH INSTRU U-SHP 10X18 (DRAPES) ×2 IMPLANT
DRAPE PROXIMA HALF (DRAPES) IMPLANT
DRSG EMULSION OIL 3X3 NADH (GAUZE/BANDAGES/DRESSINGS) IMPLANT
DURASEAL APPLICATOR TIP (TIP) ×1 IMPLANT
DURASEAL SPINE SEALANT 3ML (MISCELLANEOUS) ×1 IMPLANT
ELECT REM PT RETURN 9FT ADLT (ELECTROSURGICAL) ×2
ELECTRODE REM PT RTRN 9FT ADLT (ELECTROSURGICAL) ×1 IMPLANT
GAUZE SPONGE 4X4 12PLY STRL (GAUZE/BANDAGES/DRESSINGS) ×2 IMPLANT
GAUZE SPONGE 4X4 16PLY XRAY LF (GAUZE/BANDAGES/DRESSINGS) ×1 IMPLANT
GLOVE BIOGEL PI IND STRL 8 (GLOVE) ×2 IMPLANT
GLOVE BIOGEL PI INDICATOR 8 (GLOVE) ×2
GLOVE ECLIPSE 7.5 STRL STRAW (GLOVE) ×4 IMPLANT
GLOVE EXAM NITRILE LRG STRL (GLOVE) IMPLANT
GLOVE EXAM NITRILE XL STR (GLOVE) IMPLANT
GOWN STRL REUS W/ TWL LRG LVL3 (GOWN DISPOSABLE) IMPLANT
GOWN STRL REUS W/ TWL XL LVL3 (GOWN DISPOSABLE) ×2 IMPLANT
GOWN STRL REUS W/TWL 2XL LVL3 (GOWN DISPOSABLE) IMPLANT
GOWN STRL REUS W/TWL LRG LVL3 (GOWN DISPOSABLE)
GOWN STRL REUS W/TWL XL LVL3 (GOWN DISPOSABLE) ×4
HEMOSTAT POWDER SURGIFOAM 1G (HEMOSTASIS) ×1 IMPLANT
KIT BASIN OR (CUSTOM PROCEDURE TRAY) ×2 IMPLANT
KIT INFUSE MEDIUM (Orthopedic Implant) ×1 IMPLANT
KIT ROOM TURNOVER OR (KITS) ×2 IMPLANT
MILL MEDIUM DISP (BLADE) ×2 IMPLANT
NDL 18GX1X1/2 (RX/OR ONLY) (NEEDLE) ×1 IMPLANT
NDL SPNL 18GX3.5 QUINCKE PK (NEEDLE) ×1 IMPLANT
NDL SPNL 22GX3.5 QUINCKE BK (NEEDLE) ×1 IMPLANT
NEEDLE 18GX1X1/2 (RX/OR ONLY) (NEEDLE) ×2 IMPLANT
NEEDLE BONE MARROW 8GAX6 (NEEDLE) IMPLANT
NEEDLE SPNL 18GX3.5 QUINCKE PK (NEEDLE) ×2 IMPLANT
NEEDLE SPNL 22GX3.5 QUINCKE BK (NEEDLE) ×2 IMPLANT
NS IRRIG 1000ML POUR BTL (IV SOLUTION) ×2 IMPLANT
PACK LAMINECTOMY NEURO (CUSTOM PROCEDURE TRAY) ×2 IMPLANT
PAD ARMBOARD 7.5X6 YLW CONV (MISCELLANEOUS) ×6 IMPLANT
PATTIES SURGICAL .5 X.5 (GAUZE/BANDAGES/DRESSINGS) ×1 IMPLANT
PATTIES SURGICAL .5 X1 (DISPOSABLE) IMPLANT
PATTIES SURGICAL 1X1 (DISPOSABLE) ×2 IMPLANT
PEEK PLIF AVS 11X25X4 (Peek) ×4 IMPLANT
ROD 5.5X60MM GREEN (Rod) ×1 IMPLANT
ROD RADIUS 35MM (Rod) ×1 IMPLANT
RUBBERBAND STERILE (MISCELLANEOUS) ×2 IMPLANT
SCREW 5.75X40M (Screw) ×5 IMPLANT
SPONGE LAP 4X18 X RAY DECT (DISPOSABLE) ×1 IMPLANT
SPONGE NEURO XRAY DETECT 1X3 (DISPOSABLE) ×2 IMPLANT
SPONGE SURGIFOAM ABS GEL 100 (HEMOSTASIS) ×2 IMPLANT
STAPLER SKIN PROX WIDE 3.9 (STAPLE) IMPLANT
STRIP BIOACTIVE VITOSS 25X100X (Neuro Prosthesis/Implant) ×2 IMPLANT
STRIP CLOSURE SKIN 1/2X4 (GAUZE/BANDAGES/DRESSINGS) IMPLANT
SUT PROLENE 6 0 BV (SUTURE) ×3 IMPLANT
SUT VIC AB 1 CT1 18XBRD ANBCTR (SUTURE) ×2 IMPLANT
SUT VIC AB 1 CT1 8-18 (SUTURE) ×4
SUT VIC AB 2-0 CP2 18 (SUTURE) ×4 IMPLANT
SYR 3ML LL SCALE MARK (SYRINGE) ×8 IMPLANT
SYR 5ML LL (SYRINGE) IMPLANT
SYR CONTROL 10ML LL (SYRINGE) ×2 IMPLANT
TAPE CLOTH SURG 4X10 WHT LF (GAUZE/BANDAGES/DRESSINGS) ×1 IMPLANT
TOWEL OR 17X24 6PK STRL BLUE (TOWEL DISPOSABLE) ×2 IMPLANT
TOWEL OR 17X26 10 PK STRL BLUE (TOWEL DISPOSABLE) ×2 IMPLANT
TRAP SPECIMEN MUCOUS 40CC (MISCELLANEOUS) IMPLANT
TRAY FOLEY W/METER SILVER 14FR (SET/KITS/TRAYS/PACK) ×2 IMPLANT
WATER STERILE IRR 1000ML POUR (IV SOLUTION) ×2 IMPLANT

## 2015-12-20 NOTE — H&P (Signed)
Subjective: Patient is a 75 y.o. female who is admitted for treatment of multilevel multifactorial lumbar stenosis, contributed to by her spondylosis, lumbar degenerative disc disease, lumbar synovial cyst, and multilevel dynamic degenerative spondylolisthesis, grade 1 at L3-4 and grade 2 at L4-5. Patient's symptoms are that primarily of left lumbar radicular pain. Worse with standing and walking, typically relieved by lying. She is admitted now for a L3-L5 decompressive lumbar laminectomy with discectomy and foraminotomy, resection of synovial cyst, L3-4 and L4-5 posterior lumbar interbody arthrodesis with interbody implants and bone graft and an L3-L5 posterior lateral arthrodesis with posterior instrumentation and bone graft.   Patient Active Problem List   Diagnosis Date Noted  . Lumbar radiculopathy 09/13/2015  . Greater trochanteric bursitis of right hip 08/04/2015  . Greater trochanteric bursitis of left hip 02/17/2015  . Piriformis syndrome of left side 11/05/2014  . SI (sacroiliac) joint dysfunction 11/05/2014  . Vitamin D deficiency 09/06/2014  . Medication management 09/06/2014  . Hypertension   . GERD (gastroesophageal reflux disease)   . Prediabetes   . 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 Parkinson's disease ([place])   . 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 Hyperlipidemia 10/07/2012   Past Medical History  Diagnosis Date  . Parkinson's disease ([place]) 10-07-12  . 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 Never smoked tobacco   . Spinal stenosis   . Costochondritis   . Hyperlipidemia 10-07-12  . GERD (gastroesophageal reflux disease)   . Prediabetes   . Parkinson's disease ([place])   . Chest pain, unspecified   . History of hiatal hernia   . Headache     ocular migraines  . Arthritis   . Anemia     as young girl and teenager  . Constipation     Past Surgical History  Procedure Laterality Date  . Abdominal hysterectomy    . Hernia repair    . Rotator cuff repair Left  10-07-12  . Bunionectomy Bilateral 11-11-3  . Cardiac catheterization Bilateral 2013  . Left heart catheterization with coronary angiogram N/A 07/03/2012    Procedure: LEFT HEART CATHETERIZATION WITH CORONARY ANGIOGRAM;  Surgeon: Troy Sine, MD;  Location: [facility] CATH LAB;  Service: Cardiovascular;  Laterality: N/A;  . Appendectomy    . Colonoscopy      Prescriptions prior to admission  Medication Sig Dispense Refill Last Dose  . carbidopa-levodopa (SINEMET IR) 25-100 MG tablet TAKE 1 TABLET BY MOUTH 6 TIMES DAILY 540 tablet 1 12/20/2015 at Unknown time  . cholecalciferol (VITAMIN D) 1000 UNITS tablet Take 5,000 Units by mouth daily.   12/19/2015 at Unknown time  . cyanocobalamin 500 MCG tablet Take 500 mcg by mouth daily.   12/19/2015 at Unknown time  . ibuprofen (ADVIL,MOTRIN) 200 MG tablet Take 200 mg by mouth every 6 (six) hours as needed.   ~ 1 week  . Magnesium Oxide 500 MG TABS Take 1 tablet by mouth daily.   12/19/2015 at Unknown time  . pramipexole (MIRAPEX) 1 MG tablet TAKE 1 TABLET BY MOUTH 3 TIMES DAILY 270 tablet 1 12/20/2015 at Unknown time  . ranitidine (ZANTAC) 150 MG tablet Take 150 mg by mouth daily as needed. For acid reflex   Past Week at Unknown time   Allergies  Allergen Reactions  . Macrobid [Nitrofurantoin Macrocrystal] Swelling and Rash  . Baclofen Other (See Comments)    Kept her awake, affected memory  . Gabapentin Other (See Comments)    Weak feeling  . Cephalosporins Other (See Comments)  Cannot sleep  . Codeine Nausea Only    dizziness  . Darvocet [Propoxyphene N-Acetaminophen] Nausea And Vomiting  . Levaquin [Levofloxacin] Hives  . Penicillins Rash  . Selegiline Hcl Rash  . Tramadol Nausea And Vomiting  . Triamcinolone Itching    Social History  Substance Use Topics  . Smoking status: Never Smoker   . Smokeless tobacco: Never Used  . Alcohol Use: No    Family History  Problem Relation Age of Onset  . Heart attack Mother   . Esophageal cancer  Mother      Review of Systems Pertinent items noted in HPI and remainder of comprehensive ROS otherwise negative.  Objective: Vital signs in last 24 hours: Temp:  [97.9 F (36.6 C)] 97.9 F (36.6 C) (01/23 0837) Pulse Rate:  [78] 78 (01/23 0837) Resp:  [16] 16 (01/23 0837) BP: (120)/(51) 120/51 mmHg (01/23 0837) SpO2:  [97 %] 97 % (01/23 0837)  EXAM: Patient is a well-developed well-nourished white female in no acute distress. Lungs are clear to auscultation , the patient has symmetrical respiratory excursion. Heart has a regular rate and rhythm normal S1 and S2 no murmur.   Abdomen is soft nontender nondistended bowel sounds are present. Extremity examination shows no clubbing cyanosis or edema. Motor examination shows 5 over 5 strength in the lower extremities including the iliopsoas quadriceps dorsiflexor extensor hallicus  longus and plantar flexor bilaterally. Sensation is intact to pinprick in the distal lower extremities. Reflexes are symmetrical bilaterally. No pathologic reflexes are present. Patient has a normal gait and stance.   Data Review:CBC    Component Value Date/Time   WBC 5.8 12/16/2015 1541   RBC 4.14 12/16/2015 1541   HGB 12.7 12/16/2015 1541   HCT 39.9 12/16/2015 1541   PLT 275 12/16/2015 1541   MCV 96.4 12/16/2015 1541   MCH 30.7 12/16/2015 1541   MCHC 31.8 12/16/2015 1541   RDW 12.5 12/16/2015 1541   LYMPHSABS 1.8 10/13/2015 1649   MONOABS 0.4 10/13/2015 1649   EOSABS 0.1 10/13/2015 1649   BASOSABS 0.1 10/13/2015 1649                          BMET    Component Value Date/Time   NA 144 12/16/2015 1541   K 4.4 12/16/2015 1541   CL 109 12/16/2015 1541   CO2 28 12/16/2015 1541   GLUCOSE 101* 12/16/2015 1541   BUN 25* 12/16/2015 1541   CREATININE 1.16* 12/16/2015 1541   CREATININE 0.81 11/10/2015 1244   CALCIUM 9.4 12/16/2015 1541   GFRNONAA 46* 12/16/2015 1541   GFRNONAA 72 11/10/2015 1244   GFRAA 53* 12/16/2015 1541   GFRAA 83 11/10/2015 1244      Assessment/Plan: Patient admitted for lumbar decompression and stabilization for multilevel multifactorial lumbar stenosis and multilevel dynamic degenerative spondylolisthesis.  I've discussed with the patient the nature of his condition, the nature the surgical procedure, the typical length of surgery, hospital stay, and overall recuperation, the limitations postoperatively, and risks of surgery. I discussed risks including risks of infection, bleeding, possibly need for transfusion, the risk of nerve root dysfunction with pain, weakness, numbness, or paresthesias, the risk of dural tear and CSF leakage and possible need for further surgery, the risk of failure of the arthrodesis and possibly for further surgery, the risk of anesthetic complications including myocardial infarction, stroke, pneumonia, and death. We discussed the need for postoperative immobilization in a lumbar brace. Understanding all this the patient does  wish to proceed with surgery and is admitted for such.   Hosie Spangle, MD 12/20/2015 12:42 PM

## 2015-12-20 NOTE — Anesthesia Preprocedure Evaluation (Addendum)
Anesthesia Evaluation  Patient identified by MRN, date of birth, ID band Patient awake    Reviewed: Allergy & Precautions, H&P , NPO status , Patient's Chart, lab work & pertinent test results  Airway Mallampati: II  TM Distance: >3 FB Neck ROM: full    Dental  (+) Dental Advisory Given, Caps All upper front capped:   Pulmonary neg pulmonary ROS,    Pulmonary exam normal breath sounds clear to auscultation       Cardiovascular hypertension, Normal cardiovascular exam Rhythm:regular Rate:Normal     Neuro/Psych Parkinson's  Neuromuscular disease negative psych ROS   GI/Hepatic negative GI ROS, Neg liver ROS, hiatal hernia, GERD  Medicated and Controlled,  Endo/Other  negative endocrine ROSprediabetes  Renal/GU negative Renal ROS  negative genitourinary   Musculoskeletal   Abdominal   Peds  Hematology negative hematology ROS (+)   Anesthesia Other Findings   Reproductive/Obstetrics negative OB ROS                            Anesthesia Physical Anesthesia Plan  ASA: III  Anesthesia Plan: General   Post-op Pain Management:    Induction: Intravenous  Airway Management Planned: Oral ETT  Additional Equipment:   Intra-op Plan:   Post-operative Plan: Extubation in OR  Informed Consent: I have reviewed the patients History and Physical, chart, labs and discussed the procedure including the risks, benefits and alternatives for the proposed anesthesia with the patient or authorized representative who has indicated his/her understanding and acceptance.   Dental Advisory Given  Plan Discussed with: CRNA and Surgeon  Anesthesia Plan Comments:         Anesthesia Quick Evaluation

## 2015-12-20 NOTE — Anesthesia Procedure Notes (Signed)
Procedure Name: Intubation Date/Time: 12/20/2015 1:25 PM Performed by: Suzy Bouchard Pre-anesthesia Checklist: Patient identified, Emergency Drugs available, Suction available, Timeout performed and Patient being monitored Patient Re-evaluated:Patient Re-evaluated prior to inductionOxygen Delivery Method: Circle system utilized Preoxygenation: Pre-oxygenation with 100% oxygen Intubation Type: IV induction Ventilation: Mask ventilation without difficulty Laryngoscope Size: Miller and 2 Grade View: Grade I Tube type: Oral Laser Tube: Cuffed inflated with minimal occlusive pressure - saline Tube size: 7.0 mm Number of attempts: 1 Airway Equipment and Method: Stylet Placement Confirmation: ETT inserted through vocal cords under direct vision,  positive ETCO2 and breath sounds checked- equal and bilateral Secured at: 22 cm Tube secured with: Tape Dental Injury: Teeth and Oropharynx as per pre-operative assessment

## 2015-12-20 NOTE — Op Note (Signed)
12/20/2015  6:44 PM  PATIENT:  Alexandra Lindsey  75 y.o. female  PRE-OPERATIVE DIAGNOSIS:  Multilevel, multifactorial lumbar stenosis with neurogenic claudication, multilevel dynamic degenerative spondylolisthesis, lumbar spondylosis, lumbar degenerative disc disease, lumbar radiculopathy, lumbago  POST-OPERATIVE DIAGNOSIS:  Multilevel, multifactorial lumbar stenosis with neurogenic claudication, multilevel dynamic degenerative spondylolisthesis, lumbar spondylosis, lumbar degenerative disc disease, lumbar radiculopathy, lumbago  PROCEDURE:  Procedure(s):  L3, L4, and L5 decompressive lumbar laminectomy with bilateral L3-4 and L4-5 facetectomy and bilateral foraminotomies for decompression of the stenotic compression of the L3, L4, and L5 nerve roots, with decompression beyond that required for interbody arthrodesis; bilateral L3-4 and L4-5 posterior lumbar interbody arthrodesis with AVS peek interbody implants, Vitoss BA with bone marrow aspirate, and infuse; bilateral L3-L5 posterior lateral arthrodesis with segmental radius posterior instrumentation, locally harvested morcellized autograft, Vitoss BA with bone marrow aspirate, and infuse  SURGEON:  Surgeon(s): Jovita Gamma, MD Kary Kos, MD  ASSISTANTS: Kary Kos, M.D.  ANESTHESIA:   general  EBL:  Total I/O In: 2370 [I.V.:2000; Blood:120; IV Piggyback:250] Out: 1350 [Urine:1000; Blood:350]  BLOOD ADMINISTERED:120 CC CELLSAVER  COUNT:  Correct per nursing staff  DICTATION: Patient was brought to the operating room placed under general endotracheal anesthesia. The patient was turned to prone position, the lumbar region was prepped with Betadine soap and solution and draped in a sterile fashion. The midline was infiltrated with local anesthesia with epinephrine. A localizing x-ray was taken and then a midline incision was made and carried down through the subcutaneous tissue, bipolar cautery and electrocautery were used to maintain  hemostasis. Dissection was carried down to the lumbar fascia. The fascia was incised bilaterally and the paraspinal muscles were dissected with a spinous process and lamina in a subperiosteal fashion. Another x-ray was taken for localization and the L3-4 and L4-5 levels were localized. Dissection was then carried out laterally over the facet complexes and the transverse processes of L3, L4, and L5 were exposed and decorticated.   Decompression was begun by performing a decompressive lumbar laminectomy of L3, L4, and L5. Double-action rongeurs, the high-speed drill, Kerrison punches were used. Dissection was carried out laterally including facetectomy and foraminotomies with decompression of the stenotic compression of the L3, L4, and L5rve roots. Once the decompression of the stenotic compression of the thecal sac and exiting nerve roots was completed we proceeded with the posterior lumbar interbody arthrodesis. The annulus at each level was incised bilaterally and the disc space entered. A thorough discectomy was performed using pituitary rongeurs and curettes. Once the discectomy was completed we began to prepare the endplate surfaces, removing the cartilaginous endplates surface. We then measured the height of the intervertebral disc space. We selected 11 x 25 x 4 AVS peek interbody implants for the L3-4 level, and 11 x 25 x 4 AVS peek interbody implants for the L4-5 level.  The C-arm fluoroscope was then draped and brought in the field and we identified the pedicle entry points bilaterally at the L3, L4, and L5 levels. Each of the 6 pedicles was probed, we aspirated bone marrow aspirate from the vertebral bodies, this was injected over 2 10 cc strips of Vitoss BA. Then each of the pedicles was examined with the ball probe, good bony surfaces were found and no bony cuts were found. each pedicle was then tapped with a 5.25 mm tap. On the right side of L3, and bilaterally at L4 and L5 good threading was found  and no bony cuts were found. We then placed 5.75  x 40 mm screws in each of these pedicles.   However on the left side at L3, after the pedicle was tapped, and the tap removed, CSF was seen coming up through the screw hole, as well as from under the laminectomy edge.  An attempt was made to place a screw from a more superior and lateral entry point, but that was also unsuccessful. It was decided to forego a screw on the left side at L3.   The operating microscope was draped and brought in the field to provide additional magnification, visualization, and illumination. The left L3 laminectomy was extended rostrally, and we found that the tapped hole had breech the medial aspect the pedicle. We examined the left L3 nerve root and found a dural tear and the lateral aspect of the nerve root sleeve. The margins of the tear were defined, and then we were able to place a single 6-0 Prolene suture and approximated the dural edges. A good closure was achieved and no CSF leakage was seen when tested with a Valsalva.   We then packed the AVS peek interbody implants with Vitoss BA with bone marrow aspirate and infuse , and then placed the first implant at the L4-5 level on the right side, carefully retracting the thecal sac and nerve root medially. We then went back to the left side and packed the midline with additional Vitoss BA with bone marrow aspirate and infuse, and then placed a second implant on the left side again retracting the thecal sac and nerve root medially. Then at the L3-4 level, we placed the first implant on the right side, carefully retracting the thecal sac and nerve root medially. We then went back to the left side and packed the midline with additional Vitoss BA with bone marrow aspirate and infuse, and then placed a second implant on the left side, again retracting the thecal sac and nerve root medially.  We then packed the lateral gutter over the transverse processes and intertransverse space with  locally harvested bone graft, Vitoss BA with bone marrow aspirate, and infuse. We then selected  prelordosed rods, using a 35 mm rod in the left and a 60 mm rod on the right. They were placed within the screw heads and secured with locking caps once all 5 locking caps were placed final tightening was performed against a counter torque.  The wound had been irrigated multiple times during the procedure with saline solution and bacitracin solution, good hemostasis was established with a combination of bipolar cautery and Gelfoam with thrombin. Once good hemostasis was confirmed, DuraSeal was injected over the dural repair. Surgifoam was then placed in the epidural space against the margins of the laminectomy.  We then proceeded with closure paraspinal muscles deep fascia and Scarpa's fascia were closed with interrupted undyed 1 Vicryl sutures the subcutaneous and subcuticular closed with interrupted inverted 2-0 undyed Vicryl sutures the skin edges were approximated with Dermabond.  A dressing of sterile gauze and Hypafix was applied.   Following surgery the patient was turned back to the supine position to be reversed and the anesthetic extubated and transferred to the recovery room for further care.    PLAN OF CARE: Admit to inpatient   PATIENT DISPOSITION:  PACU - hemodynamically stable.   Delay start of Pharmacological VTE agent (>24hrs) due to surgical blood loss or risk of bleeding:  yes

## 2015-12-20 NOTE — Progress Notes (Addendum)
Patient reports that last night she urinated 6- 7 times when she normally urinates 2- 3 times. Patient denies burning with urination, hematuria, pyuria, or pubic pain. Notified Dr. Sherwood Gambler orders received.

## 2015-12-20 NOTE — Progress Notes (Signed)
Filed Vitals:   12/20/15 1916 12/20/15 1932 12/20/15 1946 12/20/15 1958  BP: 130/61 125/68 115/61   Pulse: 84 86 80   Temp:    97.5 F (36.4 C)  TempSrc:      Resp: 20 15 11    SpO2: 94% 91% 97%     Patient resting in her room, flat in bed, with the head of bed flat. Dressing clean and dry. Foley to straight drainage. Following commands, moving lower extremities well.  Plan: Will keep on bedrest for the morning, and then begin to mobilize. We'll DC Foley once mobilized. Spoke with patient's husband and her 2 nieces after surgery and again at the bedside. Their questions were answered for them.  Hosie Spangle, MD 12/20/2015, 8:23 PM

## 2015-12-20 NOTE — Transfer of Care (Signed)
Immediate Anesthesia Transfer of Care Note  Patient: Alexandra Lindsey  Procedure(s) Performed: Procedure(s) with comments: Lumbar three- four, lumbar four- five decompression wit posterior lumbar interbody fusion with interbody prosthesis, posterior lateral arthrodesis and  posterior segmental instrumentations (N/A) - L34 L45 decompression with posterior lumbar interbody fusion interbody prosthesis posterior lateral arthrodesis and posterior segmental instrumentation  Patient Location: PACU  Anesthesia Type:General  Level of Consciousness: awake, alert , oriented and patient cooperative  Airway & Oxygen Therapy: Patient Spontanous Breathing and Patient connected to face mask oxygen  Post-op Assessment: Report given to RN, Post -op Vital signs reviewed and stable and Patient moving all extremities  Post vital signs: Reviewed and stable  Last Vitals:  Filed Vitals:   12/20/15 0837  BP: 120/51  Pulse: 78  Temp: 36.6 C  Resp: 16    Complications: No apparent anesthesia complications

## 2015-12-21 LAB — URINE CULTURE

## 2015-12-21 NOTE — Anesthesia Postprocedure Evaluation (Signed)
Anesthesia Post Note  Patient: Alexandra Lindsey  Procedure(s) Performed: Procedure(s) (LRB): Lumbar three- four, lumbar four- five decompression wit posterior lumbar interbody fusion with interbody prosthesis, posterior lateral arthrodesis and  posterior segmental instrumentations (N/A)  Patient location during evaluation: PACU Anesthesia Type: General Level of consciousness: awake Pain management: pain level controlled Vital Signs Assessment: post-procedure vital signs reviewed and stable Respiratory status: spontaneous breathing, patient connected to nasal cannula oxygen and respiratory function stable Cardiovascular status: stable Postop Assessment: no signs of nausea or vomiting Anesthetic complications: no    Last Vitals:  Filed Vitals:   12/20/15 2320 12/21/15 0329  BP: 135/57 115/83  Pulse: 62 90  Temp: 36.6 C 36.5 C  Resp: 18 18    Last Pain:  Filed Vitals:   12/21/15 0504  PainSc: Asleep                 Carola Viramontes

## 2015-12-21 NOTE — Progress Notes (Signed)
Utilization review completed.  

## 2015-12-21 NOTE — Progress Notes (Signed)
Filed Vitals:   12/20/15 2011 12/20/15 2320 12/21/15 0329 12/21/15 0738  BP: 128/60 135/57 115/83 95/48  Pulse: 83 62 90 80  Temp: 97.5 F (36.4 C) 97.8 F (36.6 C) 97.7 F (36.5 C) 97.8 F (36.6 C)  TempSrc: Oral Oral Oral   Resp: 16 18 18 16   SpO2: 95% 97% 97% 94%    Patient up and ambulating in the halls with the staff. Dressing clean and dry. Foley to straight drainage, we'll have staff remove Foley, and monitor voiding function. Good relief of left lumbar radicular pain. Comfortable.  Plan: Patient doing well following surgery. Encouraged to ambulate in the halls. We'll continue to progress through postoperative recovery.  Hosie Spangle, MD 12/21/2015, 10:06 AM

## 2015-12-22 MED ORDER — HYDROCODONE-ACETAMINOPHEN 5-325 MG PO TABS: 1.0000 | ORAL_TABLET | ORAL | Status: DC | PRN

## 2015-12-22 MED ORDER — CYCLOBENZAPRINE HCL 10 MG PO TABS: 5.0000 mg | ORAL_TABLET | Freq: Three times a day (TID) | ORAL | Status: DC | PRN

## 2015-12-22 MED FILL — Sodium Chloride IV Soln 0.9%: INTRAVENOUS | Qty: 2000 | Status: AC

## 2015-12-22 MED FILL — Heparin Sodium (Porcine) Inj 1000 Unit/ML: INTRAMUSCULAR | Qty: 30 | Status: AC

## 2015-12-22 NOTE — Discharge Instructions (Signed)

## 2015-12-22 NOTE — Discharge Summary (Signed)
Physician Discharge Summary  Patient ID: Alexandra Lindsey MRN: [id number] DOB/AGE: May 30, 1941 76 y.o.  Admit date: 12/20/2015 Discharge date: 12/22/2015  Admission Diagnoses:  Multilevel, multifactorial lumbar stenosis with neurogenic claudication, multilevel dynamic degenerative spondylolisthesis, lumbar spondylosis, lumbar degenerative disc disease, lumbar radiculopathy, lumbago  Discharge Diagnoses:  Multilevel, multifactorial lumbar stenosis with neurogenic claudication, multilevel dynamic degenerative spondylolisthesis, lumbar spondylosis, lumbar degenerative disc disease, lumbar radiculopathy, lumbago Active Problems:   Lumbar stenosis with neurogenic claudication   Discharged Condition: good  Hospital Course: Patient was admitted, underwent an L3-5 decompressive lumbar laminectomy, bilateral L3-4 and L4-5 PLIF, and L3-L5 posterior lateral arthrodesis.  Postoperatively she has done well. She is voiding well since surgery she is up and him to actively in the halls. Her dressing was removed, and her incision is healing nicely. There is no erythema, swelling, or drainage. She and her husband have been given instructions regarding wound care and activities following discharge. She is scheduled to see me in the office for follow-up in 3 weeks.  Discharge Exam: Blood pressure 97/39, pulse 79, temperature 98.3 F (36.8 C), temperature source Oral, resp. rate 16, SpO2 95 %.  Disposition: 01-Home or Self Care     Medication List    TAKE these medications        carbidopa-levodopa 25-100 MG tablet  Commonly known as:  SINEMET IR  TAKE 1 TABLET BY MOUTH 6 TIMES DAILY     cholecalciferol 1000 units tablet  Commonly known as:  VITAMIN D  Take 5,000 Units by mouth daily.     cyanocobalamin 500 MCG tablet  Take 500 mcg by mouth daily.     cyclobenzaprine 10 MG tablet  Commonly known as:  FLEXERIL  Take 0.5 tablets (5 mg total) by mouth 3 (three) times daily as needed for muscle spasms.      HYDROcodone-acetaminophen 5-325 MG tablet  Commonly known as:  NORCO/VICODIN  Take 1-2 tablets by mouth every 4 (four) hours as needed (pain).     ibuprofen 200 MG tablet  Commonly known as:  ADVIL,MOTRIN  Take 200 mg by mouth every 6 (six) hours as needed.     Magnesium Oxide 500 MG Tabs  Take 1 tablet by mouth daily.     pramipexole 1 MG tablet  Commonly known as:  MIRAPEX  TAKE 1 TABLET BY MOUTH 3 TIMES DAILY     ranitidine 150 MG tablet  Commonly known as:  ZANTAC  Take 150 mg by mouth daily as needed. For acid reflex         Signed: NUDELMAN,ROBERT W 12/22/2015, 11:16 AM

## 2015-12-22 NOTE — Progress Notes (Signed)
Patient alert and oriented, mae's well, voiding adequate amount of urine, swallowing without difficulty, no c/o pain. Patient discharged home with family. Script and discharged instructions given to patient. Patient and family stated understanding of d/c instructions given and has an appointment with MD. 

## 2015-12-24 ENCOUNTER — Emergency Department (HOSPITAL_COMMUNITY): Payer: Medicare Other

## 2015-12-24 ENCOUNTER — Encounter (HOSPITAL_COMMUNITY): Payer: Self-pay | Admitting: Family Medicine

## 2015-12-24 ENCOUNTER — Encounter (HOSPITAL_COMMUNITY)
Admission: EM | Disposition: A | Discharge status: Skilled Nursing Facility | Payer: Self-pay | Source: Home / Self Care | Attending: Neurosurgery

## 2015-12-24 ENCOUNTER — Inpatient Hospital Stay (HOSPITAL_COMMUNITY)
Admission: EM | Admit: 2015-12-24 | Discharge: 2016-01-03 | DRG: 908 | Disposition: A | Discharge status: Skilled Nursing Facility | Payer: Medicare Other | Attending: Neurosurgery | Admitting: Neurosurgery

## 2015-12-24 DIAGNOSIS — M47816 Spondylosis without myelopathy or radiculopathy, lumbar region: Secondary | ICD-10-CM | POA: Diagnosis not present

## 2015-12-24 DIAGNOSIS — T8189XA Other complications of procedures, not elsewhere classified, initial encounter: Secondary | ICD-10-CM | POA: Diagnosis not present

## 2015-12-24 DIAGNOSIS — Y793 Surgical instruments, materials and orthopedic devices (including sutures) associated with adverse incidents: Secondary | ICD-10-CM | POA: Diagnosis present

## 2015-12-24 DIAGNOSIS — G9782 Other postprocedural complications and disorders of nervous system: Secondary | ICD-10-CM | POA: Diagnosis present

## 2015-12-24 DIAGNOSIS — K449 Diaphragmatic hernia without obstruction or gangrene: Secondary | ICD-10-CM | POA: Diagnosis present

## 2015-12-24 DIAGNOSIS — G20 Parkinson's disease: Secondary | ICD-10-CM | POA: Diagnosis not present

## 2015-12-24 DIAGNOSIS — R32 Unspecified urinary incontinence: Secondary | ICD-10-CM

## 2015-12-24 DIAGNOSIS — S064XAA Epidural hemorrhage with loss of consciousness status unknown, initial encounter: Secondary | ICD-10-CM

## 2015-12-24 DIAGNOSIS — G834 Cauda equina syndrome: Secondary | ICD-10-CM | POA: Diagnosis present

## 2015-12-24 DIAGNOSIS — M549 Dorsalgia, unspecified: Secondary | ICD-10-CM | POA: Diagnosis not present

## 2015-12-24 DIAGNOSIS — Y838 Other surgical procedures as the cause of abnormal reaction of the patient, or of later complication, without mention of misadventure at the time of the procedure: Secondary | ICD-10-CM | POA: Diagnosis present

## 2015-12-24 DIAGNOSIS — I621 Nontraumatic extradural hemorrhage: Secondary | ICD-10-CM | POA: Diagnosis not present

## 2015-12-24 DIAGNOSIS — S064X9A Epidural hemorrhage with loss of consciousness of unspecified duration, initial encounter: Secondary | ICD-10-CM

## 2015-12-24 DIAGNOSIS — G960 Cerebrospinal fluid leak: Secondary | ICD-10-CM | POA: Diagnosis not present

## 2015-12-24 DIAGNOSIS — E785 Hyperlipidemia, unspecified: Secondary | ICD-10-CM | POA: Diagnosis present

## 2015-12-24 DIAGNOSIS — G9529 Other cord compression: Secondary | ICD-10-CM | POA: Diagnosis present

## 2015-12-24 DIAGNOSIS — K219 Gastro-esophageal reflux disease without esophagitis: Secondary | ICD-10-CM | POA: Diagnosis present

## 2015-12-24 DIAGNOSIS — G9619 Other disorders of meninges, not elsewhere classified: Secondary | ICD-10-CM | POA: Diagnosis present

## 2015-12-24 HISTORY — PX: REPAIR OF CEREBROSPINAL FLUID LEAK: SHX6322

## 2015-12-24 LAB — BASIC METABOLIC PANEL
Anion gap: 12 (ref 5–15)
BUN: 12 mg/dL (ref 6–20)
CALCIUM: 9.1 mg/dL (ref 8.9–10.3)
CO2: 26 mmol/L (ref 22–32)
Chloride: 100 mmol/L — ABNORMAL LOW (ref 101–111)
Creatinine, Ser: 0.85 mg/dL (ref 0.44–1.00)
GFR calc Af Amer: >60 mL/min (ref >60)
GLUCOSE: 107 mg/dL — AB (ref 65–99)
POTASSIUM: 4.1 mmol/L (ref 3.5–5.1)
Sodium: 138 mmol/L (ref 135–145)

## 2015-12-24 LAB — CBC
HCT: 33.4 % — ABNORMAL LOW (ref 36.0–46.0)
HEMOGLOBIN: 10.7 g/dL — AB (ref 12.0–15.0)
MCH: 30.4 pg (ref 26.0–34.0)
MCHC: 32 g/dL (ref 30.0–36.0)
MCV: 94.9 fL (ref 78.0–100.0)
PLATELETS: 281 10*3/uL (ref 150–400)
RBC: 3.52 MIL/uL — ABNORMAL LOW (ref 3.87–5.11)
RDW: 12.5 % (ref 11.5–15.5)
WBC: 7.3 10*3/uL (ref 4.0–10.5)

## 2015-12-24 SURGERY — REPAIR OF CEREBROSPINAL FLUID LEAK
Anesthesia: General | Site: Back

## 2015-12-24 MED ORDER — OXYCODONE-ACETAMINOPHEN 5-325 MG PO TABS
ORAL_TABLET | ORAL | Status: AC
  Filled 2015-12-24: qty 1

## 2015-12-24 MED ORDER — MORPHINE SULFATE (PF) 4 MG/ML IV SOLN
4.0000 mg | Freq: Once | INTRAVENOUS | Status: AC
  Administered 2015-12-24: 4 mg via INTRAVENOUS
  Filled 2015-12-24: qty 1

## 2015-12-24 MED ORDER — GADOBENATE DIMEGLUMINE 529 MG/ML IV SOLN: 15.0000 mL | Freq: Once | INTRAVENOUS | Status: DC | PRN

## 2015-12-24 MED ORDER — HYDROMORPHONE HCL 1 MG/ML IJ SOLN
0.5000 mg | Freq: Once | INTRAMUSCULAR | Status: AC
  Administered 2015-12-24: 0.5 mg via INTRAVENOUS
  Filled 2015-12-24: qty 1

## 2015-12-24 MED ORDER — OXYCODONE-ACETAMINOPHEN 5-325 MG PO TABS
1.0000 | ORAL_TABLET | Freq: Once | ORAL | Status: AC
  Administered 2015-12-24: 1 via ORAL

## 2015-12-24 SURGICAL SUPPLY — 53 items
APL SKNCLS STERI-STRIP NONHPOA (GAUZE/BANDAGES/DRESSINGS)
APL SRG 60D 8 XTD TIP BNDBL (TIP) ×1
BAG DECANTER FOR FLEXI CONT (MISCELLANEOUS) ×3 IMPLANT
BENZOIN TINCTURE PRP APPL 2/3 (GAUZE/BANDAGES/DRESSINGS) IMPLANT
BLADE CLIPPER SURG (BLADE) IMPLANT
CANISTER SUCT 3000ML PPV (MISCELLANEOUS) ×3 IMPLANT
CLOSURE WOUND 1/2 X4 (GAUZE/BANDAGES/DRESSINGS)
DECANTER SPIKE VIAL GLASS SM (MISCELLANEOUS) ×3 IMPLANT
DRAPE LAPAROTOMY 100X72X124 (DRAPES) ×3 IMPLANT
DRAPE MICROSCOPE LEICA (MISCELLANEOUS) ×2 IMPLANT
DRAPE POUCH INSTRU U-SHP 10X18 (DRAPES) ×3 IMPLANT
DRAPE SURG 17X23 STRL (DRAPES) ×3 IMPLANT
DRSG MEPILEX BORDER 4X12 (GAUZE/BANDAGES/DRESSINGS) ×2 IMPLANT
DURAPREP 26ML APPLICATOR (WOUND CARE) ×3 IMPLANT
DURASEAL APPLICATOR TIP (TIP) ×2 IMPLANT
DURASEAL SPINE SEALANT 3ML (MISCELLANEOUS) ×2 IMPLANT
ELECT REM PT RETURN 9FT ADLT (ELECTROSURGICAL) ×3
ELECTRODE REM PT RTRN 9FT ADLT (ELECTROSURGICAL) ×1 IMPLANT
GAUZE SPONGE 4X4 12PLY STRL (GAUZE/BANDAGES/DRESSINGS) IMPLANT
GAUZE SPONGE 4X4 16PLY XRAY LF (GAUZE/BANDAGES/DRESSINGS) IMPLANT
GLOVE BIO SURGEON STRL SZ 6.5 (GLOVE) ×1 IMPLANT
GLOVE BIO SURGEON STRL SZ7 (GLOVE) ×6 IMPLANT
GLOVE BIO SURGEONS STRL SZ 6.5 (GLOVE) ×1
GLOVE ECLIPSE 6.5 STRL STRAW (GLOVE) ×3 IMPLANT
GLOVE EXAM NITRILE LRG STRL (GLOVE) IMPLANT
GLOVE EXAM NITRILE MD LF STRL (GLOVE) IMPLANT
GLOVE EXAM NITRILE XL STR (GLOVE) IMPLANT
GLOVE EXAM NITRILE XS STR PU (GLOVE) IMPLANT
GOWN STRL REUS W/ TWL LRG LVL3 (GOWN DISPOSABLE) ×2 IMPLANT
GOWN STRL REUS W/ TWL XL LVL3 (GOWN DISPOSABLE) IMPLANT
GOWN STRL REUS W/TWL 2XL LVL3 (GOWN DISPOSABLE) IMPLANT
GOWN STRL REUS W/TWL LRG LVL3 (GOWN DISPOSABLE) ×6
GOWN STRL REUS W/TWL XL LVL3 (GOWN DISPOSABLE)
KIT BASIN OR (CUSTOM PROCEDURE TRAY) ×3 IMPLANT
KIT ROOM TURNOVER OR (KITS) ×3 IMPLANT
NS IRRIG 1000ML POUR BTL (IV SOLUTION) ×3 IMPLANT
PACK LAMINECTOMY NEURO (CUSTOM PROCEDURE TRAY) ×3 IMPLANT
PAD ARMBOARD 7.5X6 YLW CONV (MISCELLANEOUS) ×9 IMPLANT
SPONGE LAP 4X18 X RAY DECT (DISPOSABLE) IMPLANT
SPONGE SURGIFOAM ABS GEL SZ50 (HEMOSTASIS) ×3 IMPLANT
STRIP CLOSURE SKIN 1/2X4 (GAUZE/BANDAGES/DRESSINGS) IMPLANT
SUT ETHILON 2 0 FS 18 (SUTURE) ×4 IMPLANT
SUT ETHILON 3 0 FSL (SUTURE) ×4 IMPLANT
SUT ETHILON 3 0 PS 1 (SUTURE) ×6 IMPLANT
SUT PROLENE 6 0 BV (SUTURE) ×4 IMPLANT
SUT SILK 3 0 TIES 10X30 (SUTURE) ×2 IMPLANT
SUT VIC AB 0 CT1 18XCR BRD8 (SUTURE) ×1 IMPLANT
SUT VIC AB 0 CT1 8-18 (SUTURE) ×12
SUT VIC AB 2-0 CT1 18 (SUTURE) ×3 IMPLANT
SUT VIC AB 3-0 SH 8-18 (SUTURE) ×3 IMPLANT
TOWEL OR 17X24 6PK STRL BLUE (TOWEL DISPOSABLE) ×3 IMPLANT
TOWEL OR 17X26 10 PK STRL BLUE (TOWEL DISPOSABLE) ×3 IMPLANT
WATER STERILE IRR 1000ML POUR (IV SOLUTION) ×3 IMPLANT

## 2015-12-24 NOTE — ED Notes (Signed)
Pt here for back pain that is worsening since surgery. sts that she has been taking her pain pills with minimal relief. Recent surgery.

## 2015-12-24 NOTE — ED Provider Notes (Signed)
CSN: [id number]     Arrival date & time 12/24/15  1638 History   First MD Initiated Contact with Patient 12/24/15 1954     Chief Complaint  Patient presents with  . Back Pain     (Consider location/radiation/quality/duration/timing/severity/associated sxs/prior Treatment) Patient is a 75 y.o. female presenting with back pain. The history is provided by the patient.  Back Pain Location:  Lumbar spine Quality:  Aching Radiates to: right buttock. Pain severity:  Severe Pain is:  Same all the time Onset quality:  Gradual Duration:  5 days Timing:  Constant Progression:  Worsening Chronicity:  New Context comment:  Recent lumbar spine surgery Relieved by:  Nothing Worsened by:  Movement Ineffective treatments:  Narcotics Associated symptoms: bladder incontinence   Associated symptoms: no abdominal pain, no dysuria, no fever, no headaches, no numbness, no paresthesias and no perianal numbness   Risk factors: recent surgery     Past Medical History  Diagnosis Date  . Parkinson's disease ([place]) 10-07-12  . 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 Never smoked tobacco   . Spinal stenosis   . Costochondritis   . Hyperlipidemia 10-07-12  . GERD (gastroesophageal reflux disease)   . Prediabetes   . Parkinson's disease ([place])   . Chest pain, unspecified   . History of hiatal hernia   . Headache     ocular migraines  . Arthritis   . Anemia     as young girl and teenager  . Constipation    Past Surgical History  Procedure Laterality Date  . Abdominal hysterectomy    . Hernia repair    . Rotator cuff repair Left 10-07-12  . Bunionectomy Bilateral 11-11-3  . Cardiac catheterization Bilateral 2013  . Left heart catheterization with coronary angiogram N/A 07/03/2012    Procedure: LEFT HEART CATHETERIZATION WITH CORONARY ANGIOGRAM;  Surgeon: Troy Sine, MD;  Location: [facility] CATH LAB;  Service: Cardiovascular;  Laterality: N/A;  . Appendectomy    . Colonoscopy    . Back  surgery     Family History  Problem Relation Age of Onset  . Heart attack Mother   . Esophageal cancer Mother    Social History  Substance Use Topics  . Smoking status: Never Smoker   . Smokeless tobacco: Never Used  . Alcohol Use: No   OB History    No data available     Review of Systems  Constitutional: Negative for fever.  HENT: Negative.   Eyes: Negative for visual disturbance.  Respiratory: Negative.   Cardiovascular: Negative.   Gastrointestinal: Negative for abdominal pain.  Genitourinary: Positive for bladder incontinence. Negative for dysuria.  Musculoskeletal: Positive for back pain.  Skin: Positive for wound. Negative for pallor and rash.  Neurological: Negative for syncope, numbness, headaches and paresthesias.      Allergies  Macrobid; Baclofen; Gabapentin; Cephalosporins; Codeine; Darvocet; Levaquin; Penicillins; Selegiline hcl; Tramadol; and Triamcinolone  Home Medications   Prior to Admission medications   Medication Sig Start Date End Date Taking? Authorizing Provider  carbidopa-levodopa (SINEMET IR) 25-100 MG tablet TAKE 1 TABLET BY MOUTH 6 TIMES DAILY 09/28/15  Yes Rebecca S [name], DO  cholecalciferol (VITAMIN D) 1000 UNITS tablet Take 5,000 Units by mouth daily.   Yes Historical Provider, MD  cyanocobalamin 500 MCG tablet Take 500 mcg by mouth daily.   Yes Historical Provider, MD  cyclobenzaprine (FLEXERIL) 10 MG tablet Take 0.5 tablets (5 mg total) by mouth 3 (three) times daily as needed for muscle spasms. 12/22/15  Yes Jovita Gamma, MD  HYDROcodone-acetaminophen (NORCO/VICODIN) 5-325 MG tablet Take 1-2 tablets by mouth every 4 (four) hours as needed (pain). 12/22/15  Yes Jovita Gamma, MD  Magnesium Oxide 500 MG TABS Take 1 tablet by mouth daily.   Yes Historical Provider, MD  polyethylene glycol (MIRALAX) packet Take 17 g by mouth daily.   Yes Historical Provider, MD  pramipexole (MIRAPEX) 1 MG tablet TAKE 1 TABLET BY MOUTH 3 TIMES DAILY 09/28/15   Yes Rebecca S [name], DO  ranitidine (ZANTAC) 150 MG tablet Take 150 mg by mouth daily as needed. For acid reflex   Yes Historical Provider, MD  ibuprofen (ADVIL,MOTRIN) 200 MG tablet Take 200 mg by mouth every 6 (six) hours as needed. Reported on 12/24/2015    Historical Provider, MD   BP 134/68 mmHg  Pulse 97  Temp(Src) 97.8 F (36.6 C) (Oral)  Resp 18  SpO2 96% Physical Exam  Constitutional: She is oriented to person, place, and time. She appears well-developed and well-nourished. No distress.  HENT:  Head: Normocephalic and atraumatic.  Mouth/Throat: No oropharyngeal exudate.  Eyes: Pupils are equal, round, and reactive to light. No scleral icterus.  Neck: Normal range of motion. Neck supple.  Cardiovascular: Normal rate, regular rhythm, normal heart sounds and intact distal pulses.   Pulmonary/Chest: Effort normal and breath sounds normal. No respiratory distress. She exhibits no tenderness.  Abdominal: Soft. She exhibits no distension. There is no tenderness. There is no rebound and no guarding.  Genitourinary:  Good rectal tone and no gross blood. No perineal numbness  Musculoskeletal: She exhibits edema (bilateral LE).  Lumbar spine surgical scar clean dry and intact without erythema or drainage but with some bruising. Moderately tender throughout.  Neurological: She is alert and oriented to person, place, and time. She displays no tremor. No cranial nerve deficit or sensory deficit. She exhibits normal muscle tone. GCS eye subscore is 4. GCS verbal subscore is 5. GCS motor subscore is 6.  4 out of 5 strength in bilateral lower extremities likely secondary to pain in her lower back. There is no clonus  Skin: Skin is warm and dry. No rash noted. She is not diaphoretic. No erythema. No pallor.  Psychiatric: She has a normal mood and affect.  Nursing note and vitals reviewed.   ED Course  Procedures (including critical care time) Labs Review Labs Reviewed  CBC - Abnormal; Notable  for the following:    RBC 3.52 (*)    Hemoglobin 10.7 (*)    HCT 33.4 (*)    All other components within normal limits  BASIC METABOLIC PANEL - Abnormal; Notable for the following:    Chloride 100 (*)    Glucose, Bld 107 (*)    All other components within normal limits    Imaging Review Mr Lumbar Spine W Wo Contrast  12/24/2015  CLINICAL DATA:  Initial evaluation for severe back pain status post recent surgery. EXAM: MRI LUMBAR SPINE WITHOUT AND WITH CONTRAST TECHNIQUE: Multiplanar and multiecho pulse sequences of the lumbar spine were obtained without and with intravenous contrast. CONTRAST:  20 cc of MultiHance. COMPARISON:  Prior study from 12/15/2015. FINDINGS: 5 mm anterolisthesis of L3 on L4 is stable. Vertebral bodies otherwise normally aligned with preservation of the normal lumbar lordosis. Vertebral body heights maintained. No fracture. Signal intensity within the vertebral body bone marrow is normal. No abnormal enhancement. Conus medullaris terminates at approximately the L2 level, stable. The visualized cord is shifted anteriorly within the thecal sac. Postoperative changes  from recent posterior spinal decompression with fusion present at L3 through L5. Interbody spacers in place at L3-4 and L4-5. Bilateral transpedicular screws at L4 and L5, with unilateral right-sided screw at L3. Screw tract present on the left at L3, consistent with screw removal. There is a T2/STIR hyperintense collection at the laminectomy site that measures approximately 7.6 x 3.1 x 2.6 cm at the level of L3 through L5. This collection extends superiorly within the dorsal epidural space to the level of T11 (series 4, image 7 on sagittal, series 5, image 7 on axial). Collection appears to communicate with the left L3 screw tract. There is secondary relatively severe canal stenosis with displacement of the distal cord and nerve roots of the cauda equina throughout the lumbar spine. Stenosis most severe at the level of  L3 (series 4, image 8). There is minimal enhancement about this collection, with no overt evidence to suggest infection. Fluid extends along the midline incision within the lower back. Postoperative changes seen within the paraspinous musculature is well. There is edema within the posterior left psoas muscle at the level of L3 through L5. T11-12: Moderate to severe canal stenosis due to epidural collection with displacement of the distal cord anteriorly. No disc bulge or disc protrusion. Foramina patent. T12-L1: Moderate to severe canal stenosis due to epidural collection with anterior displacement of the distal cord. No degenerative changes. No foraminal stenosis. L1-2: Severe canal stenosis due to epidural collection with displacement and compression of the distal cord/nerve roots of the cauda equina anteriorly. Minimal annular disc bulge without protrusion. Foramina widely patent. L2-3: Moderate to severe canal stenosis with epidural collection within the left dorsolateral epidural space. Asymmetric fluid within the left L2-3 facet. Mild disc bulge without focal disc protrusion. No significant foraminal stenosis. L3-4: Status post decompression with fusion. Postoperative collection at the laminectomy site with resultant severe canal stenosis and compression of the nerve roots of the cauda equina. Interbody spacer in place and appears appropriate position. No significant foraminal narrowing. Stable 5 mm anterolisthesis. L4-5: Status post decompression with fusion. Postoperative collection at the laminectomy site with moderate to severe canal narrowing. Interbody spacer in place and well-positioned. Mild right foraminal narrowing. L5-S1: Mild diffuse disc bulge with disc desiccation. No focal disc herniation. No significant canal stenosis. Foramina remain widely patent. IMPRESSION: 1. Postoperative changes from recent posterior spinal decompression with fusion at L3 through L5. There is a postoperative collection  at the laminectomy site, which extends cephalad within the dorsal epidural space to the level of T11. There is fairly severe canal stenosis with anterior displacement of the distal cord and compression of the nerve roots of the cauda equina, most severe at L3. No overt signs for acute infection at this time. Possible CSF leak not excluded. 2. Additional mild multilevel degenerative spondylolysis as above. Critical Value/emergent results were called by telephone at the time of interpretation on 12/24/2015 at 11:25 pm to Dr. Venora Maples , who verbally acknowledged these results. Electronically Signed   By: Jeannine Boga M.D.   On: 12/24/2015 23:36   I have personally reviewed and evaluated these images and lab results as part of my medical decision-making.   EKG Interpretation None      MDM   Final diagnoses:  Urinary incontinence  Epidural hematoma [facility])    Patient is a 75 year old female with history of lumbar back surgery on Monday who presents today with severe lower back pain radiating to her right buttock as well as urinary incontinence that is  new since the surgery. She denies any fevers, cough, chest pain, or abdominal pain. Further history and exam as above notable for stable vital signs. Back wound appears clean dry and intact with some swelling but otherwise no drainage and there is good rectal tone. Given concerns of possible cord compression MRI obtained. MRI revealed a significant fluid collection along the surgical area with severe canal stenosis with anterior displacement of the distal cord and nerve roots. Neurosurgery consulted. Patient will be taken to the OR for further surgical management with neurosurgery.    Heriberto Antigua, MD 12/25/15 [place], MD 12/25/15 [phone]

## 2015-12-24 NOTE — ED Notes (Signed)
Pt not in room yet.

## 2015-12-24 NOTE — ED Notes (Signed)
Patient transported to MRI 

## 2015-12-25 ENCOUNTER — Emergency Department (HOSPITAL_COMMUNITY): Payer: Medicare Other | Admitting: Anesthesiology

## 2015-12-25 DIAGNOSIS — M1990 Unspecified osteoarthritis, unspecified site: Secondary | ICD-10-CM | POA: Diagnosis not present

## 2015-12-25 DIAGNOSIS — M4806 Spinal stenosis, lumbar region: Secondary | ICD-10-CM | POA: Diagnosis not present

## 2015-12-25 DIAGNOSIS — R2689 Other abnormalities of gait and mobility: Secondary | ICD-10-CM | POA: Diagnosis not present

## 2015-12-25 DIAGNOSIS — K219 Gastro-esophageal reflux disease without esophagitis: Secondary | ICD-10-CM | POA: Diagnosis present

## 2015-12-25 DIAGNOSIS — K449 Diaphragmatic hernia without obstruction or gangrene: Secondary | ICD-10-CM | POA: Diagnosis present

## 2015-12-25 DIAGNOSIS — Y793 Surgical instruments, materials and orthopedic devices (including sutures) associated with adverse incidents: Secondary | ICD-10-CM | POA: Diagnosis present

## 2015-12-25 DIAGNOSIS — M6281 Muscle weakness (generalized): Secondary | ICD-10-CM | POA: Diagnosis not present

## 2015-12-25 DIAGNOSIS — G960 Cerebrospinal fluid leak: Secondary | ICD-10-CM | POA: Diagnosis present

## 2015-12-25 DIAGNOSIS — Z981 Arthrodesis status: Secondary | ICD-10-CM | POA: Diagnosis not present

## 2015-12-25 DIAGNOSIS — G9782 Other postprocedural complications and disorders of nervous system: Secondary | ICD-10-CM | POA: Diagnosis present

## 2015-12-25 DIAGNOSIS — G8929 Other chronic pain: Secondary | ICD-10-CM | POA: Diagnosis not present

## 2015-12-25 DIAGNOSIS — R32 Unspecified urinary incontinence: Secondary | ICD-10-CM | POA: Diagnosis not present

## 2015-12-25 DIAGNOSIS — E785 Hyperlipidemia, unspecified: Secondary | ICD-10-CM | POA: Diagnosis present

## 2015-12-25 DIAGNOSIS — G9619 Other disorders of meninges, not elsewhere classified: Secondary | ICD-10-CM | POA: Diagnosis present

## 2015-12-25 DIAGNOSIS — E7800 Pure hypercholesterolemia, unspecified: Secondary | ICD-10-CM | POA: Diagnosis not present

## 2015-12-25 DIAGNOSIS — G834 Cauda equina syndrome: Secondary | ICD-10-CM | POA: Diagnosis present

## 2015-12-25 DIAGNOSIS — M62838 Other muscle spasm: Secondary | ICD-10-CM | POA: Diagnosis not present

## 2015-12-25 DIAGNOSIS — T8189XA Other complications of procedures, not elsewhere classified, initial encounter: Secondary | ICD-10-CM | POA: Diagnosis not present

## 2015-12-25 DIAGNOSIS — E569 Vitamin deficiency, unspecified: Secondary | ICD-10-CM | POA: Diagnosis not present

## 2015-12-25 DIAGNOSIS — M549 Dorsalgia, unspecified: Secondary | ICD-10-CM | POA: Diagnosis not present

## 2015-12-25 DIAGNOSIS — M545 Low back pain: Secondary | ICD-10-CM | POA: Diagnosis not present

## 2015-12-25 DIAGNOSIS — G9529 Other cord compression: Secondary | ICD-10-CM | POA: Diagnosis present

## 2015-12-25 DIAGNOSIS — S064X9A Epidural hemorrhage with loss of consciousness of unspecified duration, initial encounter: Secondary | ICD-10-CM | POA: Diagnosis not present

## 2015-12-25 DIAGNOSIS — Y838 Other surgical procedures as the cause of abnormal reaction of the patient, or of later complication, without mention of misadventure at the time of the procedure: Secondary | ICD-10-CM | POA: Diagnosis present

## 2015-12-25 DIAGNOSIS — G20 Parkinson's disease: Secondary | ICD-10-CM | POA: Diagnosis present

## 2015-12-25 LAB — BASIC METABOLIC PANEL
Anion gap: 5 (ref 5–15)
BUN: 8 mg/dL (ref 6–20)
CHLORIDE: 108 mmol/L (ref 101–111)
CO2: 27 mmol/L (ref 22–32)
Calcium: 8.4 mg/dL — ABNORMAL LOW (ref 8.9–10.3)
Creatinine, Ser: 0.74 mg/dL (ref 0.44–1.00)
GFR calc Af Amer: >60 mL/min (ref >60)
GLUCOSE: 104 mg/dL — AB (ref 65–99)
POTASSIUM: 3.7 mmol/L (ref 3.5–5.1)
Sodium: 140 mmol/L (ref 135–145)

## 2015-12-25 LAB — CBC
HEMATOCRIT: 29 % — AB (ref 36.0–46.0)
HEMOGLOBIN: 9.5 g/dL — AB (ref 12.0–15.0)
MCH: 31.1 pg (ref 26.0–34.0)
MCHC: 32.8 g/dL (ref 30.0–36.0)
MCV: 95.1 fL (ref 78.0–100.0)
PLATELETS: 238 10*3/uL (ref 150–400)
RBC: 3.05 MIL/uL — AB (ref 3.87–5.11)
RDW: 12.4 % (ref 11.5–15.5)
WBC: 5.5 10*3/uL (ref 4.0–10.5)

## 2015-12-25 MED ORDER — HYDROCODONE-ACETAMINOPHEN 5-325 MG PO TABS: 1.0000 | ORAL_TABLET | ORAL | Status: DC | PRN

## 2015-12-25 MED ORDER — HYDROCODONE-ACETAMINOPHEN 5-325 MG PO TABS
1.0000 | ORAL_TABLET | ORAL | Status: DC | PRN
  Administered 2015-12-25: 2 via ORAL
  Administered 2015-12-27 – 2015-12-30 (×3): 1 via ORAL
  Administered 2015-12-31: 2 via ORAL
  Administered 2015-12-31: 1 via ORAL
  Administered 2016-01-01: 2 via ORAL
  Filled 2015-12-25: qty 2
  Filled 2015-12-25: qty 1
  Filled 2015-12-25 (×3): qty 2
  Filled 2015-12-25: qty 1
  Filled 2015-12-25: qty 2
  Filled 2015-12-25: qty 1

## 2015-12-25 MED ORDER — VANCOMYCIN HCL IN DEXTROSE 1-5 GM/200ML-% IV SOLN
INTRAVENOUS | Status: AC
  Administered 2015-12-25: 1000 mg via INTRAVENOUS
  Filled 2015-12-25: qty 200

## 2015-12-25 MED ORDER — GLYCOPYRROLATE 0.2 MG/ML IJ SOLN
INTRAMUSCULAR | Status: DC | PRN
  Administered 2015-12-25: 0.4 mg via INTRAVENOUS

## 2015-12-25 MED ORDER — BISACODYL 5 MG PO TBEC: 5.0000 mg | DELAYED_RELEASE_TABLET | Freq: Every day | ORAL | Status: DC | PRN

## 2015-12-25 MED ORDER — SODIUM CHLORIDE 0.9 % IJ SOLN
INTRAMUSCULAR | Status: AC
  Filled 2015-12-25: qty 10

## 2015-12-25 MED ORDER — ONDANSETRON HCL 4 MG/2ML IJ SOLN: 4.0000 mg | INTRAMUSCULAR | Status: DC | PRN

## 2015-12-25 MED ORDER — VITAMIN B-12 100 MCG PO TABS
500.0000 ug | ORAL_TABLET | Freq: Every day | ORAL | Status: DC
  Administered 2015-12-25 – 2016-01-03 (×10): 500 ug via ORAL
  Filled 2015-12-25 (×2): qty 1
  Filled 2015-12-25: qty 5
  Filled 2015-12-25: qty 1
  Filled 2015-12-25: qty 5
  Filled 2015-12-25 (×5): qty 1

## 2015-12-25 MED ORDER — KETOROLAC TROMETHAMINE 30 MG/ML IJ SOLN
INTRAMUSCULAR | Status: AC
  Filled 2015-12-25: qty 1

## 2015-12-25 MED ORDER — METHOCARBAMOL 500 MG PO TABS
500.0000 mg | ORAL_TABLET | Freq: Four times a day (QID) | ORAL | Status: DC | PRN
  Administered 2015-12-25: 500 mg via ORAL
  Filled 2015-12-25: qty 1

## 2015-12-25 MED ORDER — POLYETHYLENE GLYCOL 3350 17 G PO PACK
17.0000 g | PACK | Freq: Every day | ORAL | Status: DC
  Administered 2015-12-25 – 2016-01-03 (×10): 17 g via ORAL
  Filled 2015-12-25 (×10): qty 1

## 2015-12-25 MED ORDER — FENTANYL CITRATE (PF) 100 MCG/2ML IJ SOLN
INTRAMUSCULAR | Status: AC
  Filled 2015-12-25: qty 2

## 2015-12-25 MED ORDER — PANTOPRAZOLE SODIUM 40 MG IV SOLR
40.0000 mg | Freq: Every day | INTRAVENOUS | Status: DC
Start: 2015-12-25 — End: 2015-12-26
  Administered 2015-12-25: 40 mg via INTRAVENOUS
  Filled 2015-12-25: qty 40

## 2015-12-25 MED ORDER — ONDANSETRON HCL 4 MG/2ML IJ SOLN
INTRAMUSCULAR | Status: AC
  Filled 2015-12-25: qty 2

## 2015-12-25 MED ORDER — SENNA 8.6 MG PO TABS
1.0000 | ORAL_TABLET | Freq: Two times a day (BID) | ORAL | Status: DC
  Administered 2015-12-25 – 2016-01-03 (×19): 8.6 mg via ORAL
  Filled 2015-12-25 (×18): qty 1

## 2015-12-25 MED ORDER — PHENOL 1.4 % MT LIQD: 1.0000 | OROMUCOSAL | Status: DC | PRN

## 2015-12-25 MED ORDER — SODIUM CHLORIDE 0.9 % IV SOLN
250.0000 mL | INTRAVENOUS | Status: DC
Start: 2015-12-25 — End: 2016-01-03

## 2015-12-25 MED ORDER — DIAZEPAM 5 MG PO TABS
5.0000 mg | ORAL_TABLET | Freq: Four times a day (QID) | ORAL | Status: DC | PRN
  Administered 2015-12-25 – 2015-12-27 (×5): 5 mg via ORAL
  Filled 2015-12-25 (×5): qty 1

## 2015-12-25 MED ORDER — GLYCOPYRROLATE 0.2 MG/ML IJ SOLN
INTRAMUSCULAR | Status: AC
  Filled 2015-12-25: qty 2

## 2015-12-25 MED ORDER — HYDROMORPHONE HCL 1 MG/ML IJ SOLN
0.5000 mg | INTRAMUSCULAR | Status: DC | PRN
  Administered 2015-12-25 – 2015-12-27 (×10): 1 mg via INTRAVENOUS
  Filled 2015-12-25 (×10): qty 1

## 2015-12-25 MED ORDER — VITAMIN D 1000 UNITS PO TABS
5000.0000 [IU] | ORAL_TABLET | Freq: Every day | ORAL | Status: DC
  Administered 2015-12-25 – 2016-01-03 (×10): 5000 [IU] via ORAL
  Filled 2015-12-25 (×10): qty 5

## 2015-12-25 MED ORDER — FAMOTIDINE 20 MG PO TABS
20.0000 mg | ORAL_TABLET | Freq: Two times a day (BID) | ORAL | Status: DC
  Administered 2015-12-25 – 2016-01-03 (×19): 20 mg via ORAL
  Filled 2015-12-25 (×19): qty 1

## 2015-12-25 MED ORDER — SUFENTANIL CITRATE 50 MCG/ML IV SOLN
INTRAVENOUS | Status: AC
  Filled 2015-12-25: qty 1

## 2015-12-25 MED ORDER — SUCCINYLCHOLINE CHLORIDE 20 MG/ML IJ SOLN
INTRAMUSCULAR | Status: DC | PRN
  Administered 2015-12-25: 120 mg via INTRAVENOUS

## 2015-12-25 MED ORDER — SODIUM CHLORIDE 0.9 % IV SOLN
INTRAVENOUS | Status: DC
  Administered 2015-12-25 (×3): via INTRAVENOUS

## 2015-12-25 MED ORDER — KETOROLAC TROMETHAMINE 30 MG/ML IJ SOLN
INTRAMUSCULAR | Status: DC | PRN
  Administered 2015-12-25: 15 mg via INTRAVENOUS

## 2015-12-25 MED ORDER — LIDOCAINE HCL (CARDIAC) 20 MG/ML IV SOLN
INTRAVENOUS | Status: AC
  Filled 2015-12-25: qty 5

## 2015-12-25 MED ORDER — LIDOCAINE HCL (CARDIAC) 20 MG/ML IV SOLN
INTRAVENOUS | Status: DC | PRN
  Administered 2015-12-25: 75 mg via INTRAVENOUS

## 2015-12-25 MED ORDER — CYCLOBENZAPRINE HCL 10 MG PO TABS
5.0000 mg | ORAL_TABLET | Freq: Three times a day (TID) | ORAL | Status: DC | PRN
  Administered 2015-12-25 – 2015-12-27 (×5): 5 mg via ORAL
  Filled 2015-12-25 (×6): qty 1

## 2015-12-25 MED ORDER — ACETAMINOPHEN 325 MG PO TABS
650.0000 mg | ORAL_TABLET | ORAL | Status: DC | PRN
  Administered 2015-12-31: 650 mg via ORAL
  Filled 2015-12-25: qty 2

## 2015-12-25 MED ORDER — LACTATED RINGERS IV SOLN
INTRAVENOUS | Status: DC | PRN
  Administered 2015-12-25 (×2): via INTRAVENOUS

## 2015-12-25 MED ORDER — FENTANYL CITRATE (PF) 100 MCG/2ML IJ SOLN
25.0000 ug | INTRAMUSCULAR | Status: DC | PRN
  Administered 2015-12-25 (×4): 25 ug via INTRAVENOUS

## 2015-12-25 MED ORDER — MIDAZOLAM HCL 2 MG/2ML IJ SOLN
INTRAMUSCULAR | Status: AC
  Filled 2015-12-25: qty 2

## 2015-12-25 MED ORDER — NEOSTIGMINE METHYLSULFATE 10 MG/10ML IV SOLN
INTRAVENOUS | Status: AC
  Filled 2015-12-25: qty 1

## 2015-12-25 MED ORDER — CARBIDOPA-LEVODOPA 25-100 MG PO TABS
1.0000 | ORAL_TABLET | ORAL | Status: DC
  Administered 2015-12-25 – 2015-12-27 (×12): 1 via ORAL
  Filled 2015-12-25 (×12): qty 1

## 2015-12-25 MED ORDER — METHOCARBAMOL 1000 MG/10ML IJ SOLN
500.0000 mg | Freq: Four times a day (QID) | INTRAVENOUS | Status: DC | PRN
  Administered 2015-12-25 – 2015-12-26 (×2): 500 mg via INTRAVENOUS
  Filled 2015-12-25 (×2): qty 5

## 2015-12-25 MED ORDER — MIDAZOLAM HCL 5 MG/5ML IJ SOLN
INTRAMUSCULAR | Status: DC | PRN
Start: 2015-12-25 — End: 2015-12-25
  Administered 2015-12-25: 2 mg via INTRAVENOUS

## 2015-12-25 MED ORDER — PROPOFOL 10 MG/ML IV BOLUS
INTRAVENOUS | Status: DC | PRN
  Administered 2015-12-25: 150 mg via INTRAVENOUS

## 2015-12-25 MED ORDER — WHITE PETROLATUM GEL
Status: AC
  Administered 2015-12-25: 0.2
  Filled 2015-12-25: qty 1

## 2015-12-25 MED ORDER — 0.9 % SODIUM CHLORIDE (POUR BTL) OPTIME
TOPICAL | Status: DC | PRN
  Administered 2015-12-25: 1000 mL

## 2015-12-25 MED ORDER — FLEET ENEMA 7-19 GM/118ML RE ENEM: 1.0000 | ENEMA | Freq: Once | RECTAL | Status: DC | PRN

## 2015-12-25 MED ORDER — THROMBIN 20000 UNITS EX SOLR
CUTANEOUS | Status: DC | PRN
  Administered 2015-12-25: 20 mL via TOPICAL

## 2015-12-25 MED ORDER — MENTHOL 3 MG MT LOZG: 1.0000 | LOZENGE | OROMUCOSAL | Status: DC | PRN

## 2015-12-25 MED ORDER — ONDANSETRON HCL 4 MG/2ML IJ SOLN
INTRAMUSCULAR | Status: DC | PRN
  Administered 2015-12-25: 4 mg via INTRAVENOUS

## 2015-12-25 MED ORDER — ROCURONIUM BROMIDE 100 MG/10ML IV SOLN
INTRAVENOUS | Status: DC | PRN
  Administered 2015-12-25: 25 mg via INTRAVENOUS

## 2015-12-25 MED ORDER — SODIUM CHLORIDE 0.9% FLUSH
3.0000 mL | Freq: Two times a day (BID) | INTRAVENOUS | Status: DC
  Administered 2015-12-25 – 2015-12-29 (×6): 3 mL via INTRAVENOUS
  Administered 2015-12-29: 11:00:00 via INTRAVENOUS
  Administered 2015-12-30 – 2016-01-03 (×4): 3 mL via INTRAVENOUS

## 2015-12-25 MED ORDER — SUCCINYLCHOLINE CHLORIDE 20 MG/ML IJ SOLN
INTRAMUSCULAR | Status: AC
  Filled 2015-12-25: qty 1

## 2015-12-25 MED ORDER — VANCOMYCIN HCL IN DEXTROSE 1-5 GM/200ML-% IV SOLN
1000.0000 mg | INTRAVENOUS | Status: DC
  Administered 2015-12-25 – 2015-12-29 (×4): 1000 mg via INTRAVENOUS
  Filled 2015-12-25 (×5): qty 200

## 2015-12-25 MED ORDER — SODIUM CHLORIDE 0.9% FLUSH
3.0000 mL | INTRAVENOUS | Status: DC | PRN
Start: 2015-12-25 — End: 2016-01-03

## 2015-12-25 MED ORDER — ACETAMINOPHEN 650 MG RE SUPP: 650.0000 mg | RECTAL | Status: DC | PRN

## 2015-12-25 MED ORDER — DOCUSATE SODIUM 100 MG PO CAPS
100.0000 mg | ORAL_CAPSULE | Freq: Two times a day (BID) | ORAL | Status: DC
Start: 2015-12-25 — End: 2016-01-03
  Administered 2015-12-25 – 2016-01-03 (×19): 100 mg via ORAL
  Filled 2015-12-25 (×19): qty 1

## 2015-12-25 MED ORDER — SUFENTANIL CITRATE 50 MCG/ML IV SOLN
INTRAVENOUS | Status: DC | PRN
  Administered 2015-12-25 (×3): 5 ug via INTRAVENOUS

## 2015-12-25 MED ORDER — OXYCODONE-ACETAMINOPHEN 5-325 MG PO TABS
1.0000 | ORAL_TABLET | ORAL | Status: DC | PRN
  Administered 2015-12-25 – 2015-12-26 (×2): 2 via ORAL
  Filled 2015-12-25 (×3): qty 2

## 2015-12-25 MED ORDER — NEOSTIGMINE METHYLSULFATE 10 MG/10ML IV SOLN
INTRAVENOUS | Status: DC | PRN
  Administered 2015-12-25: 3.5 mg via INTRAVENOUS

## 2015-12-25 MED ORDER — ROCURONIUM BROMIDE 50 MG/5ML IV SOLN
INTRAVENOUS | Status: AC
  Filled 2015-12-25: qty 1

## 2015-12-25 MED ORDER — PROPOFOL 10 MG/ML IV BOLUS
INTRAVENOUS | Status: AC
  Filled 2015-12-25: qty 20

## 2015-12-25 MED ORDER — PRAMIPEXOLE DIHYDROCHLORIDE 1 MG PO TABS
1.0000 mg | ORAL_TABLET | Freq: Three times a day (TID) | ORAL | Status: DC
  Administered 2015-12-25 – 2016-01-03 (×28): 1 mg via ORAL
  Filled 2015-12-25: qty 1
  Filled 2015-12-25: qty 8
  Filled 2015-12-25 (×5): qty 1
  Filled 2015-12-25: qty 8
  Filled 2015-12-25 (×3): qty 1
  Filled 2015-12-25: qty 8
  Filled 2015-12-25 (×19): qty 1
  Filled 2015-12-25 (×2): qty 8

## 2015-12-25 MED ORDER — MAGNESIUM OXIDE 400 (241.3 MG) MG PO TABS
400.0000 mg | ORAL_TABLET | Freq: Every day | ORAL | Status: DC
  Administered 2015-12-25 – 2016-01-03 (×10): 400 mg via ORAL
  Filled 2015-12-25 (×10): qty 1

## 2015-12-25 NOTE — Op Note (Signed)
12/24/2015 - 12/25/2015  2:40 AM  PATIENT:  Alexandra Lindsey  75 y.o. female  PRE-OPERATIVE DIAGNOSIS:  Compressive pseudomeningocele  POST-OPERATIVE DIAGNOSIS:  Same  PROCEDURE:  Wound exploration, repair of CSF leak, placement of lumbar drain  SURGEON:  Aldean Ast, MD  ASSISTANTS: None  ANESTHESIA:   General  DRAINS: Lumbar drain  SPECIMEN:  None  INDICATION FOR PROCEDURE: This patient is a 75 year old Caucasian woman who is 4 days status post L3-5 posterior lumbar interbody fusion. She presents with severe back pain, urinary incontinence, lower extremity weakness, and imaging findings suggestive of a compressive pseudomeningocele. Patient and family understood the risks, benefits, and alternatives and potential outcomes and wished to proceed.  PROCEDURE DETAILS: After smooth induction of general endotracheal anesthesia the patient was turned prone on a Wilson frame. The operative site was prepped and draped in the usual sterile fashion. Her incision was reopened. After opening the fascia CSF under pressure was encountered. I inspected the site of the previous durotomy repair and found a very subtle CSF leak only during Valsalva. I repaired this with a simple interrupted Prolene stitch and a muscle patch. I then sealed this with DuraSeal and did not appreciate any more CSF egress during Valsalva. I attempted to place the lumbar drain at the L5-S1 interspace but was unable to obtain CSF flow, likely due to the angle of approach. I did not think it was safe to place it superior to the L3 lamina due to the risk of conus injury. I tunneled it through the undisturbed ligamentum flavum just below the residual L3 lamina on the right. I was able to pass a lumbar drain with ease. It flowed spontaneously. It was tunneled out the skin and secured. The wound was irrigated vigorously with bacitracin saline. It was then closed in routine anatomic layers in as watertight a fashion as possible. The  skin was closed with a running locked 2-0 nylon suture. The patient was returned to the supine position the stretcher and allowed to awaken from anesthesia.  PATIENT DISPOSITION:  ICU   Delay start of Pharmacological VTE agent (>24hrs) due to surgical blood loss or risk of bleeding:  Yes

## 2015-12-25 NOTE — Progress Notes (Signed)
ANTIBIOTIC CONSULT NOTE - INITIAL  Pharmacy Consult for Vancomycin Indication: lumbar wound  Allergies  Allergen Reactions  . Macrobid [Nitrofurantoin Macrocrystal] Swelling and Rash  . Baclofen Other (See Comments)    Kept her awake, affected memory  . Gabapentin Other (See Comments)    Weak feeling  . Cephalosporins Other (See Comments)    Cannot sleep  . Codeine Nausea Only    dizziness  . Darvocet [Propoxyphene N-Acetaminophen] Nausea And Vomiting  . Levaquin [Levofloxacin] Hives  . Penicillins Rash  . Selegiline Hcl Rash  . Tramadol Nausea And Vomiting  . Triamcinolone Itching    Vital Signs: Temp: 97.5 F (36.4 C) (01/28 0400) Temp Source: Oral (01/27 1653) BP: 100/47 mmHg (01/28 0400) Pulse Rate: 93 (01/28 0400) Intake/Output from previous day: 01/27 0701 - 01/28 0700 In: 1500 [I.V.:1500] Out: 600 [Urine:575; Blood:25] Intake/Output from this shift: Total I/O In: 1500 [I.V.:1500] Out: 600 [Urine:575; Blood:25]  Labs:  Recent Labs  12/24/15 2040  WBC 7.3  HGB 10.7*  PLT 281  CREATININE 0.85   Estimated Creatinine Clearance: 49.7 mL/min (by C-G formula based on Cr of 0.85). No results for input(s): VANCOTROUGH, VANCOPEAK, VANCORANDOM, GENTTROUGH, GENTPEAK, GENTRANDOM, TOBRATROUGH, TOBRAPEAK, TOBRARND, AMIKACINPEAK, AMIKACINTROU, AMIKACIN in the last 72 hours.   Microbiology: Recent Results (from the past 720 hour(s))  Surgical pcr screen     Status: None   Collection Time: 12/16/15  3:41 PM  Result Value Ref Range Status   MRSA, PCR NEGATIVE NEGATIVE Final   Staphylococcus aureus NEGATIVE NEGATIVE Final    Comment:        The Xpert SA Assay (FDA approved for NASAL specimens in patients over 16 years of age), is one component of a comprehensive surveillance program.  Test performance has been validated by [facility] for patients greater than or equal to 32 year old. It is not intended to diagnose infection nor to guide or monitor treatment.   Urine culture     Status: None   Collection Time: 12/20/15  9:07 AM  Result Value Ref Range Status   Specimen Description URINE, CLEAN CATCH  Final   Special Requests NONE  Final   Culture MULTIPLE SPECIES PRESENT, SUGGEST RECOLLECTION  Final   Report Status 12/21/2015 FINAL  Final    Medical History: Past Medical History  Diagnosis Date  . Parkinson's disease ([place]) 10-07-12  . 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 Never smoked tobacco   . Spinal stenosis   . Costochondritis   . Hyperlipidemia 10-07-12  . GERD (gastroesophageal reflux disease)   . Prediabetes   . Parkinson's disease ([place])   . Chest pain, unspecified   . History of hiatal hernia   . Headache     ocular migraines  . Arthritis   . Anemia     as young girl and teenager  . Constipation     Assessment: 75 year old female to begin Vancomycin s/p repair of CSF leak, placement of lumbar drain  Goal of Therapy:  Vancomycin trough level 10-15 mcg/ml  Plan:  Vancomycin 1 gram iv Q 24 hours Follow up Scr, cultures, fever trend  Thank you Anette Guarneri, PharmD [phone]  12/25/2015,4:21 AM

## 2015-12-25 NOTE — H&P (Signed)
CC:  Chief Complaint  Patient presents with  . Back Pain    HPI: Alexandra Lindsey is a 75 y.o. female year old woman who underwent L3-5 fusion by Dr. Sherwood Gambler on 12/20/15.  She presents with severely worsened back pain, urinary incontinence, and lower extremity weakness.  Of note, her surgery was complicated by a cereberospinal fluid leak which was repaired intraoperatively.  PMH: Past Medical History  Diagnosis Date  . Parkinson's disease ([place]) 10-07-12  . 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 Never smoked tobacco   . Spinal stenosis   . Costochondritis   . Hyperlipidemia 10-07-12  . GERD (gastroesophageal reflux disease)   . Prediabetes   . Parkinson's disease ([place])   . Chest pain, unspecified   . History of hiatal hernia   . Headache     ocular migraines  . Arthritis   . Anemia     as young girl and teenager  . Constipation     PSH: Past Surgical History  Procedure Laterality Date  . Abdominal hysterectomy    . Hernia repair    . Rotator cuff repair Left 10-07-12  . Bunionectomy Bilateral 11-11-3  . Cardiac catheterization Bilateral 2013  . Left heart catheterization with coronary angiogram N/A 07/03/2012    Procedure: LEFT HEART CATHETERIZATION WITH CORONARY ANGIOGRAM;  Surgeon: Troy Sine, MD;  Location[facility] CATH LAB;  Service: Cardiovascular;  Laterality: N/A;  . Appendectomy    . Colonoscopy    . Back surgery      SH: Social History  Substance Use Topics  . Smoking status: Never Smoker   . Smokeless tobacco: Never Used  . Alcohol Use: No    MEDS: Prior to Admission medications   Medication Sig Start Date End Date Taking? Authorizing Provider  carbidopa-levodopa (SINEMET IR) 25-100 MG tablet TAKE 1 TABLET BY MOUTH 6 TIMES DAILY 09/28/15  Yes Rebecca S [name], DO  cholecalciferol (VITAMIN D) 1000 UNITS tablet Take 5,000 Units by mouth daily.   Yes Historical Provider, MD  cyanocobalamin 500 MCG tablet Take 500 mcg by mouth daily.   Yes Historical  Provider, MD  cyclobenzaprine (FLEXERIL) 10 MG tablet Take 0.5 tablets (5 mg total) by mouth 3 (three) times daily as needed for muscle spasms. 12/22/15  Yes Jovita Gamma, MD  HYDROcodone-acetaminophen (NORCO/VICODIN) 5-325 MG tablet Take 1-2 tablets by mouth every 4 (four) hours as needed (pain). 12/22/15  Yes Jovita Gamma, MD  Magnesium Oxide 500 MG TABS Take 1 tablet by mouth daily.   Yes Historical Provider, MD  polyethylene glycol (MIRALAX) packet Take 17 g by mouth daily.   Yes Historical Provider, MD  pramipexole (MIRAPEX) 1 MG tablet TAKE 1 TABLET BY MOUTH 3 TIMES DAILY 09/28/15  Yes Rebecca S [name], DO  ranitidine (ZANTAC) 150 MG tablet Take 150 mg by mouth daily as needed. For acid reflex   Yes Historical Provider, MD  ibuprofen (ADVIL,MOTRIN) 200 MG tablet Take 200 mg by mouth every 6 (six) hours as needed. Reported on 12/24/2015    Historical Provider, MD    ALLERGY: Allergies  Allergen Reactions  . Macrobid [Nitrofurantoin Macrocrystal] Swelling and Rash  . Baclofen Other (See Comments)    Kept her awake, affected memory  . Gabapentin Other (See Comments)    Weak feeling  . Cephalosporins Other (See Comments)    Cannot sleep  . Codeine Nausea Only    dizziness  . Darvocet [Propoxyphene N-Acetaminophen] Nausea And Vomiting  . Levaquin [Levofloxacin] Hives  . Penicillins  Rash  . Selegiline Hcl Rash  . Tramadol Nausea And Vomiting  . Triamcinolone Itching    ROS: ROS  NEUROLOGIC EXAM: Awake, alert, oriented Memory and concentration grossly intact Speech fluent, appropriate CN grossly intact Motor exam: Upper Extremities Deltoid Bicep Tricep Grip  Right 5/5 5/5 5/5 5/5  Left 5/5 5/5 5/5 5/5   Lower Extremity IP Quad PF DF EHL  Right 5/5 4/5 4/5 4/5 4/5  Left 5/5 4/5 4/5 4/5 4/5   Sensation grossly intact to LT  IMAGING: Pseudomeningocele which appears to flatten the thecal sac and compress the cauda equina.  IMPRESSION: - 75 y.o. female with s/s  consistent with cauda equina syndrome.  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  PLAN: - To OR for wound exploration, possible CSF leak repair, placement of lumbar drain - ICU post-op

## 2015-12-25 NOTE — Anesthesia Preprocedure Evaluation (Addendum)
Anesthesia Evaluation  Patient identified by MRN, date of birth, ID band Patient awake    Airway Mallampati: II  TM Distance: >3 FB     Dental   Pulmonary neg pulmonary ROS,    breath sounds clear to auscultation       Cardiovascular hypertension,  Rhythm:Regular Rate:Normal     Neuro/Psych    GI/Hepatic Neg liver ROS, hiatal hernia, GERD  ,  Endo/Other    Renal/GU negative Renal ROS     Musculoskeletal   Abdominal   Peds  Hematology   Anesthesia Other Findings   Reproductive/Obstetrics                            Anesthesia Physical Anesthesia Plan  ASA: III and emergent  Anesthesia Plan: General   Post-op Pain Management:    Induction: Intravenous  Airway Management Planned: Oral ETT  Additional Equipment:   Intra-op Plan:   Post-operative Plan: Possible Post-op intubation/ventilation  Informed Consent: I have reviewed the patients History and Physical, chart, labs and discussed the procedure including the risks, benefits and alternatives for the proposed anesthesia with the patient or authorized representative who has indicated his/her understanding and acceptance.   Dental advisory given  Plan Discussed with: CRNA and Anesthesiologist  Anesthesia Plan Comments:        Anesthesia Quick Evaluation

## 2015-12-25 NOTE — Anesthesia Procedure Notes (Signed)
Procedure Name: Intubation Date/Time: 12/25/2015 12:49 AM Performed by: Achille Xiang S Pre-anesthesia Checklist: Patient identified, Timeout performed, Emergency Drugs available, Suction available and Patient being monitored Patient Re-evaluated:Patient Re-evaluated prior to inductionOxygen Delivery Method: Circle system utilized Preoxygenation: Pre-oxygenation with 100% oxygen Intubation Type: IV induction, Rapid sequence and Cricoid Pressure applied Ventilation: Mask ventilation without difficulty Laryngoscope Size: Mac and 3 Grade View: Grade I Tube type: Oral Tube size: 7.0 mm Number of attempts: 1 Airway Equipment and Method: Stylet Placement Confirmation: ETT inserted through vocal cords under direct vision,  positive ETCO2 and breath sounds checked- equal and bilateral Secured at: 22 cm Tube secured with: Tape Dental Injury: Teeth and Oropharynx as per pre-operative assessment

## 2015-12-25 NOTE — Progress Notes (Signed)
No acute post operative events.  States back pain improved. AVSS Sleeping on arrival but arouses, somewhat confused Appears to be full strength in lower extremities but she is somewhat groggy Sensation intact to light touch BLE Bandage in place Drain functioning Stable/improved Continue lumbar drain Head of bed flat through weekend

## 2015-12-25 NOTE — ED Notes (Signed)
Transported to Neuro OR by Olen Cordial, EMT

## 2015-12-25 NOTE — Progress Notes (Signed)
Orthopedic Tech Progress Note Patient Details:  Alexandra Lindsey 11-30-1940 [id number]  Patient ID: Alexandra Lindsey, female   DOB: May 12, 1941, 75 y.o.   MRN: [id number]   [place] Pink 12/25/2015, 9:27 AMCalled [employer] for TLSO.

## 2015-12-25 NOTE — Transfer of Care (Signed)
Immediate Anesthesia Transfer of Care Note  Patient: Alexandra Lindsey  Procedure(s) Performed: Procedure(s): LUMBAR WOUND EXPLORATION, REPAIR OF CEREBROSPINAL FLUID LEAK, PLACEMENT OF LUMBAR DRAIN (N/A)  Patient Location: PACU  Anesthesia Type:General  Level of Consciousness: awake  Airway & Oxygen Therapy: Patient Spontanous Breathing and Patient connected to face mask oxygen  Post-op Assessment: Report given to RN and Post -op Vital signs reviewed and stable  Post vital signs: Reviewed and stable  Last Vitals:  Filed Vitals:   12/24/15 2330 12/25/15 0000  BP: 120/69 134/68  Pulse: 99 97  Temp:    Resp:      Complications: No apparent anesthesia complications

## 2015-12-26 MED ORDER — PANTOPRAZOLE SODIUM 40 MG PO TBEC
40.0000 mg | DELAYED_RELEASE_TABLET | Freq: Every day | ORAL | Status: DC
  Administered 2015-12-26 – 2016-01-02 (×8): 40 mg via ORAL
  Filled 2015-12-26 (×8): qty 1

## 2015-12-26 MED ORDER — OXYCODONE HCL ER 20 MG PO T12A
20.0000 mg | EXTENDED_RELEASE_TABLET | Freq: Two times a day (BID) | ORAL | Status: DC
  Administered 2015-12-26 – 2015-12-27 (×3): 20 mg via ORAL
  Filled 2015-12-26 (×3): qty 1

## 2015-12-26 MED ORDER — METHOCARBAMOL 1000 MG/10ML IJ SOLN
500.0000 mg | Freq: Four times a day (QID) | INTRAMUSCULAR | Status: DC | PRN
  Administered 2015-12-27: 500 mg via INTRAVENOUS
  Filled 2015-12-26 (×4): qty 5

## 2015-12-26 MED ORDER — METHOCARBAMOL 750 MG PO TABS
750.0000 mg | ORAL_TABLET | Freq: Four times a day (QID) | ORAL | Status: DC | PRN
  Administered 2015-12-26 – 2015-12-28 (×2): 750 mg via ORAL
  Filled 2015-12-26 (×2): qty 2

## 2015-12-26 NOTE — Anesthesia Postprocedure Evaluation (Signed)
Anesthesia Post Note  Patient: Alexandra Lindsey  Procedure(s) Performed: Procedure(s) (LRB): LUMBAR WOUND EXPLORATION, REPAIR OF CEREBROSPINAL FLUID LEAK, PLACEMENT OF LUMBAR DRAIN (N/A)  Patient location during evaluation: PACU Level of consciousness: awake Pain management: pain level controlled Vital Signs Assessment: post-procedure vital signs reviewed and stable Respiratory status: spontaneous breathing Cardiovascular status: stable Anesthetic complications: no    Last Vitals:  Filed Vitals:   12/26/15 1156 12/26/15 1200  BP:  130/70  Pulse:  93  Temp: 37.1 C   Resp:  16    Last Pain:  Filed Vitals:   12/26/15 1204  PainSc: Asleep                 EDWARDS,Love Chowning

## 2015-12-26 NOTE — Progress Notes (Signed)
No acute events Continues to complain of leg pain/spasms AVSS Awake, alert, confused Full strength lower extremities Drain dripping very slowly Continue head of bed flat Add oxycontin to assist pain control Continue lumbar drainage

## 2015-12-26 NOTE — Progress Notes (Signed)
Utilization review completed.  

## 2015-12-27 ENCOUNTER — Encounter (HOSPITAL_COMMUNITY): Payer: Self-pay | Admitting: Neurological Surgery

## 2015-12-27 MED ORDER — GABAPENTIN 100 MG PO CAPS
200.0000 mg | ORAL_CAPSULE | Freq: Two times a day (BID) | ORAL | Status: DC
  Administered 2015-12-27 – 2015-12-28 (×3): 200 mg via ORAL
  Filled 2015-12-27 (×5): qty 2

## 2015-12-27 MED ORDER — CARBIDOPA-LEVODOPA 25-100 MG PO TABS
1.0000 | ORAL_TABLET | ORAL | Status: DC
  Administered 2015-12-27 – 2016-01-03 (×42): 1 via ORAL
  Filled 2015-12-27 (×42): qty 1

## 2015-12-27 MED ORDER — KCL IN DEXTROSE-NACL 20-5-0.45 MEQ/L-%-% IV SOLN
INTRAVENOUS | Status: DC
  Administered 2015-12-27: 50 mL/h via INTRAVENOUS
  Administered 2015-12-28 – 2016-01-01 (×6): via INTRAVENOUS
  Filled 2015-12-27 (×8): qty 1000

## 2015-12-27 MED ORDER — OXYCODONE HCL ER 15 MG PO T12A
15.0000 mg | EXTENDED_RELEASE_TABLET | Freq: Two times a day (BID) | ORAL | Status: DC
  Administered 2015-12-27 – 2015-12-28 (×3): 15 mg via ORAL
  Filled 2015-12-27 (×3): qty 1

## 2015-12-27 MED ORDER — CETYLPYRIDINIUM CHLORIDE 0.05 % MT LIQD
7.0000 mL | Freq: Two times a day (BID) | OROMUCOSAL | Status: DC
  Administered 2015-12-27 – 2016-01-03 (×13): 7 mL via OROMUCOSAL

## 2015-12-27 NOTE — Progress Notes (Signed)
RN questioned pt on what Neurontin does to her because it is on her allergy list and Dr. Sherwood Gambler wants to better manage her pain and feels like she needs something to help with neuropathic pain.  Pt stated that "it makes me feel loopy" and when I asked her to be more specific she stated "it just makes me feel bad"   Dr Donnella Bi plan is to start her on a low dose of neurontin if she is willing so that we can see if that will better control her pain in addition to reducing the amount of narcotic she is on due to her mental status throughout the day.   Pt stated that she was willing to give it a try. Dr. Sherwood Gambler will place the orders. I spoke with pharmacy about the allergy being more of an intolerance issue than a true drug allergy.  Due to the patient's LOC, Dr. Sherwood Gambler would like [place] to reserve the use of IV pain medicine for only severe, unretractable pain.

## 2015-12-27 NOTE — Progress Notes (Signed)
Dr. Sherwood Gambler by to visit patient, gave verbal instructions to keep foley catheter in until pt is off of bedrest and no longer needs to lie flat. Pt is barely able to tolerate minimal movement with turns and bathing.

## 2015-12-27 NOTE — Clinical Social Work Note (Signed)
Clinical Social Worker received standing order referral for possible ST-SNF placement.  Chart reviewed. Patient confused at this time and PT/OT pending due to patient head of bed remaining flat and continuing lumbar drain. CSW signing off - please re consult if CSW needs arise when patient medically appropriate.  Alexandra Lindsey, [place]

## 2015-12-27 NOTE — Progress Notes (Signed)
Subjective: Patient sleeping, easily aroused by voice. Still having pain in the back and lower extremities. Dressing dry and intact for RN. Also reports variable, but continued drainage into lumbar drain.  Objective: Vital signs in last 24 hours: Filed Vitals:   12/27/15 1000 12/27/15 1100 12/27/15 1200 12/27/15 1300  BP: 118/58 139/76 118/69 116/65  Pulse: 95 93 97 95  Temp:   98.1 F (36.7 C)   TempSrc:   Oral   Resp: 13 15 10 11   Height:      Weight:      SpO2: 96% 94% 97% 98%    Intake/Output from previous day: 01/29 0701 - 01/30 0700 In: 1857.7 [P.O.:1080; I.V.:522.7; IV Piggyback:255] Out: [id number] [Urine:4010; Drains:52] Intake/Output this shift: Total I/O In: 343 [P.O.:340; I.V.:3] Out: 533.5 [Urine:520; Drains:13.5]  Physical Exam:  Strength 5/5 in iliopsoas, quadriceps, dorsi flexor, and plantar flexor.  CBC  Recent Labs  12/24/15 2040 12/25/15 0610  WBC 7.3 5.5  HGB 10.7* 9.5*  HCT 33.4* 29.0*  PLT 281 238   BMET  Recent Labs  12/24/15 2040 12/25/15 0610  NA 138 140  K 4.1 3.7  CL 100* 108  CO2 26 27  GLUCOSE 107* 104*  BUN 12 8  CREATININE 0.85 0.74  CALCIUM 9.1 8.4*    Assessment/Plan: Seems sedated by OxyContin 20 mg twice a day, we'll reduce dose to 15 mg twice a day. We'll try and speak with the patient's husband about possible use of gabapentin (there is a reported allergy or intolerance, but I like to know more about the nature of that).  We'll continue lumbar drain for now, and continue to work on pain management.  We'll keep on strict bedrest, with head of bed flat.  Spoke with the pharmacist about adjusting Sinemet dose to the patient's usual home schedule of 6 times per day at 0530, 0900, 1100, 1400, 1600, and 1800. They'll add a custom schedule.   Hosie Spangle, MD 12/27/2015, 3:25 PM

## 2015-12-28 NOTE — Progress Notes (Signed)
Patient ID: Alexandra Lindsey, female   DOB: [birth year], 75 y.o.   MRN: [id number] Contacted because patient having clear drainage that has saturated her dressing and bed sheets. Lumbar drain is still draining. However with so much drainage coming around catheter at skin exit site, I removed drain. Incsion itself was clean and dry. After removing catheter I dressed the area with betadine and clean dry guaze.

## 2015-12-28 NOTE — Progress Notes (Addendum)
Went in to reposition patient and noticed lumbar drain had no volume in last hour.  Rolled patient and found bed pad saturated with CSF. Paged Dr. Sherwood Gambler to notify.  Waiting on reply for further orders. Maliq Pilley C 12/28/2015 1:57 PM  Called Dr. Sherwood Gambler at the office at 1440.  He said drain needed to be removed so consult Dr. Cyndy Freeze and/or Dr. Ellene Route to remove the drain.  Dr. Ellene Route was informed of Dr. Donnella Bi intention to remove drain and came to bedside to remove at 1445. Dasiah Hooley C 3:21 PM

## 2015-12-28 NOTE — Progress Notes (Signed)
Subjective: Patient seen on morning rounds.  She was more alert and comfortable.  Seen again at midday after 1000 meds including OxyContin and gabapentin, and was drowsier and complaining of more pain.  I did have an opportunity to speak with the patient's husband at that time as well.  Lumbar drain had continued to drain well, but this afternoon I was called by the patient's nurse because there was leakage around the lumbar drain catheter.  Because I was out of the hospital, her nurse was able to have Dr. Ellene Route remove the lumbar drain.  She explains that they prepped the lumbar drain site with Betadine and a dressing was applied.  She also reported that the the lumbar wound incision itself looks great and that the patient is actually more comfortable and more alert this afternoon.  Objective: Vital signs in last 24 hours: Filed Vitals:   12/28/15 1100 12/28/15 1200 12/28/15 1300 12/28/15 1400  BP: 134/77 133/84 108/66 109/64  Pulse: 92 91 94 87  Temp:  98.2 F (36.8 C)    TempSrc:  Oral    Resp: 13 15 13 11   Height:      Weight:      SpO2: 91% 95% 92% 94%    Intake/Output from previous day: 01/30 0701 - 01/31 0700 In: 2013.8 [P.O.:1070; I.V.:743.8; IV Piggyback:200] Out: [id number] [Urine:3070; Drains:218] Intake/Output this shift: Total I/O In: 850 [P.O.:500; I.V.:350] Out: 1264 [Urine:1200; Drains:64]  Physical Exam:  Strength 5/5 in iliopsoas, dorsal flexor, and plantar flexor laterally.  Assessment/Plan: Stable neurologically.  We'll monitor now that lumbar drain is out. We'll also continue to monitor her pain management, with the consideration of gradually increasing her gabapentin, and gradually decreasing her OxyContin.   Hosie Spangle, MD 12/28/2015, 3:51 PM

## 2015-12-28 NOTE — Care Management Note (Signed)
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  PTA, pt independent, lives with spouse.                    Action/Plan: Will follow for discharge planning as pt progresses.    Expected Discharge Date:                  Expected Discharge Plan:   Home with [facility] services  In-House Referral:     Discharge planning Services   CM referral  Post Acute Care Choice:    Choice offered to:     DME Arranged:    DME Agency:     HH Arranged:    HH Agency:     Status of Service:   In process, will continue to follow  Medicare Important Message Given:    Date Medicare IM Given:    Medicare IM give by:    Date Additional Medicare IM Given:    Additional Medicare Important Message give by:     If discussed at [place] of Stay Meetings, dates discussed:    Additional Comments:  Reinaldo Raddle, RN, BSN  Trauma/Neuro ICU Case Manager [phone]

## 2015-12-28 NOTE — Progress Notes (Signed)
Pharmacy Antibiotic Note  Alexandra Lindsey is a 75 y.o. female admitted on 12/24/2015 with surgical prophylaxis.  Pharmacy has been consulted for vancomycin dosing.  Plan:  34 yof s/p wound exploration and repair of CSF leak with lumbar drain placement continues on post-op vancomycin for prophylaxis. Pt is afebrile and no recent labs or cultures.   - Continue vancomycin 1gm IV Q24H - F/u renal fxn, C&S, clinical status and trough at SS  Height: 4\' 11"  (149.9 cm) Weight: 160 lb 11.5 oz (72.9 kg) IBW/kg (Calculated) : 43.2  Temp (24hrs), Avg:97.6 F (36.4 C), Min:97.3 F (36.3 C), Max:98.1 F (36.7 C)   Recent Labs Lab 12/24/15 2040 12/25/15 0610  WBC 7.3 5.5  CREATININE 0.85 0.74    Estimated Creatinine Clearance: 54.5 mL/min (by C-G formula based on Cr of 0.74).    Allergies  Allergen Reactions  . Macrobid [Nitrofurantoin Macrocrystal] Swelling and Rash  . Baclofen Other (See Comments)    Kept her awake, affected memory  . Gabapentin Other (See Comments)    Weak feeling; made her feel " loopy".   . Cephalosporins Other (See Comments)    Cannot sleep  . Codeine Nausea Only    dizziness  . Darvocet [Propoxyphene N-Acetaminophen] Nausea And Vomiting  . Levaquin [Levofloxacin] Hives  . Penicillins Rash  . Selegiline Hcl Rash  . Tramadol Nausea And Vomiting  . Triamcinolone Itching    Antimicrobials this admission: Vanc 1/28>>  Dose adjustments this admission: N/A  Microbiology results: None  Thank you for allowing pharmacy to be a part of this patient's care.  Horald Birky, Rande Lawman 12/28/2015 8:44 AM

## 2015-12-29 LAB — CBC
HCT: 33.2 % — ABNORMAL LOW (ref 36.0–46.0)
Hemoglobin: 11.1 g/dL — ABNORMAL LOW (ref 12.0–15.0)
MCH: 31.1 pg (ref 26.0–34.0)
MCHC: 33.4 g/dL (ref 30.0–36.0)
MCV: 93 fL (ref 78.0–100.0)
Platelets: 412 10*3/uL — ABNORMAL HIGH (ref 150–400)
RBC: 3.57 MIL/uL — ABNORMAL LOW (ref 3.87–5.11)
RDW: 12.1 % (ref 11.5–15.5)
WBC: 6 10*3/uL (ref 4.0–10.5)

## 2015-12-29 LAB — BASIC METABOLIC PANEL
Anion gap: 9 (ref 5–15)
BUN: 9 mg/dL (ref 6–20)
CO2: 28 mmol/L (ref 22–32)
Calcium: 9 mg/dL (ref 8.9–10.3)
Chloride: 100 mmol/L — ABNORMAL LOW (ref 101–111)
Creatinine, Ser: 0.73 mg/dL (ref 0.44–1.00)
GFR calc Af Amer: >60 mL/min (ref >60)
GFR calc non Af Amer: >60 mL/min (ref >60)
Glucose, Bld: 107 mg/dL — ABNORMAL HIGH (ref 65–99)
Potassium: 4.5 mmol/L (ref 3.5–5.1)
Sodium: 137 mmol/L (ref 135–145)

## 2015-12-29 MED ORDER — GABAPENTIN 300 MG PO CAPS
300.0000 mg | ORAL_CAPSULE | Freq: Two times a day (BID) | ORAL | Status: DC
  Administered 2015-12-29 – 2016-01-03 (×11): 300 mg via ORAL
  Filled 2015-12-29 (×11): qty 1

## 2015-12-29 MED ORDER — OXYCODONE HCL ER 10 MG PO T12A
10.0000 mg | EXTENDED_RELEASE_TABLET | Freq: Two times a day (BID) | ORAL | Status: DC
  Administered 2015-12-29 – 2016-01-03 (×11): 10 mg via ORAL
  Filled 2015-12-29 (×12): qty 1

## 2015-12-29 NOTE — Progress Notes (Signed)
Subjective: Patient resting in bed, comfortable. Pain overall seems to be under much better control. Small amount of drainage from lumbar drain site, dressing changed per Dr. Clarice Pole order.  Objective: Vital signs in last 24 hours: Filed Vitals:   12/29/15 0300 12/29/15 0400 12/29/15 0500 12/29/15 0600  BP: 92/46 106/51 104/62 103/52  Pulse: 83 81 79 75  Temp:  97.6 F (36.4 C)    TempSrc:  Oral    Resp: 18 13 12 12   Height:      Weight:      SpO2: 99% 98% 96% 100%    Intake/Output from previous day: 01/31 0701 - 02/01 0700 In: 1850 [P.O.:500; I.V.:1150; IV Piggyback:200] Out: [id number] [Urine:3350; Drains:64] Intake/Output this shift:    Physical Exam:  Awake and alert, moving all extremities well.   CBC  Recent Labs  12/29/15 0550  WBC 6.0  HGB 11.1*  HCT 33.2*  PLT 412*   BMET  Recent Labs  12/29/15 0550  NA 137  K 4.5  CL 100*  CO2 28  GLUCOSE 107*  BUN 9  CREATININE 0.73  CALCIUM 9.0    Assessment/Plan: Continued improvement in pain control. We will decrease OxyContin to 10 mg every 12 hours, and increase gabapentin to 3 mg every 12 hours. We'll begin to mobilize tomorrow a.m. Orders written, we'll consult PT and OT once mobilizing. Have explained to patient and her husband that I will be out of town starting tomorrow and will return on Tuesday, February 7. Dr. Suezanne Jacquet Ditty, whom they know, will cover while I'm out of town.   Hosie Spangle, MD 12/29/2015, 7:57 AM

## 2015-12-29 NOTE — Progress Notes (Signed)
On hourly assessment and in turning patient, found bed pad to be saturated with CSF.  Cleansed surgical site, applied betadine and redressed.   Paged Dr. Sherwood Gambler at his office to notify of patient status at 1230. Waiting on reply. [place], [place]

## 2015-12-29 NOTE — Progress Notes (Signed)
Called Dr. Ellene Route and notified him that patient's bed pad is dampened with CSF fluid. Dressing is clean and dry but  dressing is dampened to corner of drain site. Orders received to clean area with betadine and redress if dressing becomes saturated. Bed pad changed, will continue to assess for drainage.

## 2015-12-30 LAB — VANCOMYCIN, TROUGH: Vancomycin Tr: 6 ug/mL — ABNORMAL LOW (ref 10.0–20.0)

## 2015-12-30 MED ORDER — SODIUM CHLORIDE 0.9 % IV BOLUS (SEPSIS)
500.0000 mL | Freq: Once | INTRAVENOUS | Status: AC
  Administered 2015-12-30: 500 mL via INTRAVENOUS

## 2015-12-30 MED ORDER — VANCOMYCIN HCL IN DEXTROSE 1-5 GM/200ML-% IV SOLN
1000.0000 mg | Freq: Two times a day (BID) | INTRAVENOUS | Status: DC
  Administered 2015-12-30 – 2016-01-01 (×5): 1000 mg via INTRAVENOUS
  Filled 2015-12-30 (×6): qty 200

## 2015-12-30 NOTE — Progress Notes (Signed)
No acute events.  No headache. AVSS Moving legs well Incision c/d/i No leak Continue observation in ICU

## 2015-12-30 NOTE — Care Management Important Message (Signed)
Important Message  Patient Details  Name: Alexandra Lindsey MRN: [id number] Date of Birth: Jun 22, 1941   Medicare Important Message Given:  Yes    Nathen May 12/30/2015, 11:11 AM

## 2015-12-30 NOTE — Progress Notes (Signed)
Pharmacy Antibiotic Note  Alexandra Lindsey is a 75 y.o. female admitted on 12/24/2015 .  Pharmacy has been consulted for vancomycin dosing for surgical prophylaxis while lumbar drain still in place. Vancomycin trough tonight is low.   Plan: -Increase vancomycin to 1000 mg IV q12h -Drain in now OUT, f/u this AM regarding discontinuation of vancomycin if desired by neurosurgeon   Height: 4\' 11"  (149.9 cm) Weight: 160 lb 11.5 oz (72.9 kg) IBW/kg (Calculated) : 43.2  Temp (24hrs), Avg:98 F (36.7 C), Min:97 F (36.1 C), Max:99.1 F (37.3 C)   Recent Labs Lab 12/24/15 2040 12/25/15 0610 12/29/15 0550 12/29/15 2315  WBC 7.3 5.5 6.0  --   CREATININE 0.85 0.74 0.73  --   VANCOTROUGH  --   --   --  6*    Estimated Creatinine Clearance: 54.5 mL/min (by C-G formula based on Cr of 0.73).    Allergies  Allergen Reactions  . Macrobid [Nitrofurantoin Macrocrystal] Swelling and Rash  . Baclofen Other (See Comments)    Kept her awake, affected memory  . Gabapentin Other (See Comments)    Weak feeling; made her feel " loopy".   . Cephalosporins Other (See Comments)    Cannot sleep  . Codeine Nausea Only    dizziness  . Darvocet [Propoxyphene N-Acetaminophen] Nausea And Vomiting  . Levaquin [Levofloxacin] Hives  . Penicillins Rash  . Selegiline Hcl Rash  . Tramadol Nausea And Vomiting  . Triamcinolone Itching    Thank you for allowing pharmacy to be a part of this patient's care.  Narda Bonds 12/30/2015 12:09 AM

## 2015-12-31 NOTE — Progress Notes (Signed)
No acute events No headaches AVSS Awake and alert Moving legs well Incision c/d/i and flat No evidence of CSF leak One more day of observation in the ICU to make sure if she does leak it is detected

## 2015-12-31 NOTE — Progress Notes (Signed)
Upon assessment, pt hypotensive 98/55 MAP (68), prior BP at 20:00, 79/37 MAP (49). HR 89. Pt awake, a/o x 4. Spoke with Dr Cyndy Freeze. Orders given for 500cc NS bolus. Will administer and continue to monitor.

## 2015-12-31 NOTE — Progress Notes (Signed)
Went to reposition patient and found bed pad saturated. After further assessment of the pt's back, dressing clean/dry/intact, no drainage coming from site or dressing. Pt foley leaking. Notified neurosurgery MD. Orders given to continue to monitor dressing to back closely.

## 2016-01-01 ENCOUNTER — Other Ambulatory Visit: Payer: Self-pay | Admitting: Internal Medicine

## 2016-01-01 LAB — BASIC METABOLIC PANEL
Anion gap: 10 (ref 5–15)
BUN: 11 mg/dL (ref 6–20)
CO2: 28 mmol/L (ref 22–32)
Calcium: 9.4 mg/dL (ref 8.9–10.3)
Chloride: 100 mmol/L — ABNORMAL LOW (ref 101–111)
Creatinine, Ser: 0.72 mg/dL (ref 0.44–1.00)
GFR calc Af Amer: >60 mL/min (ref >60)
GFR calc non Af Amer: >60 mL/min (ref >60)
Glucose, Bld: 114 mg/dL — ABNORMAL HIGH (ref 65–99)
Potassium: 4.3 mmol/L (ref 3.5–5.1)
Sodium: 138 mmol/L (ref 135–145)

## 2016-01-01 MED ORDER — ENSURE ENLIVE PO LIQD
237.0000 mL | Freq: Two times a day (BID) | ORAL | Status: DC
  Administered 2016-01-01 – 2016-01-03 (×5): 237 mL via ORAL
  Filled 2016-01-01 (×7): qty 237

## 2016-01-01 NOTE — Progress Notes (Signed)
Pt seen and examined. No issues overnight. No new complaints.  EXAM: Temp:  [97.4 F (36.3 C)-98.3 F (36.8 C)] 97.4 F (36.3 C) (02/04 0749) Pulse Rate:  [79-96] 79 (02/04 0900) Resp:  [9-20] 18 (02/04 0900) BP: (77-123)/(34-82) 78/48 mmHg (02/04 0900) SpO2:  [93 %-100 %] 99 % (02/04 0900) Intake/Output      02/03 0701 - 02/04 0700 02/04 0701 - 02/05 0700   P.O. 120    I.V. (mL/kg) 1200 (16.5) 100 (1.4)   IV Piggyback 400    Total Intake(mL/kg) 1720 (23.6) 100 (1.4)   Urine (mL/kg/hr) 2275 (1.3) 255 (1.3)   Stool 0 (0)    Total Output 2275 255   Net -555 -155        Stool Occurrence 1 x     Awake, alert, oriented Speech fluent CN intact Good strength Wound dry, no leak  LABS: Lab Results  Component Value Date   CREATININE 0.72 01/01/2016   BUN 11 01/01/2016   NA 138 01/01/2016   K 4.3 01/01/2016   CL 100* 01/01/2016   CO2 28 01/01/2016   Lab Results  Component Value Date   WBC 6.0 12/29/2015   HGB 11.1* 12/29/2015   HCT 33.2* 12/29/2015   MCV 93.0 12/29/2015   PLT 412* 12/29/2015    IMPRESSION: - 75 y.o. female POD# 6 s/p pseudomeningocele repair. LD out, no further leak.  PLAN: - transfer to floor - d/c foley - mobilize with PT/OT

## 2016-01-01 NOTE — Progress Notes (Signed)
Initial Nutrition Assessment   INTERVENTION:  Provide Ensure Enlive po BID, each supplement provides 350 kcal and 20 grams of protein   NUTRITION DIAGNOSIS:   Inadequate oral intake related to poor appetite as evidenced by energy intake < or equal to 50% for > or equal to 5 days.   GOAL:   Patient will meet greater than or equal to 90% of their needs   MONITOR:   PO intake, Supplement acceptance, Labs, Weight trends, Skin  REASON FOR ASSESSMENT:   Malnutrition Screening Tool    ASSESSMENT:   75 y.o. female year old woman who underwent L3-5 fusion by Dr. Sherwood Gambler on 12/20/15. She presents with severely worsened back pain, urinary incontinence, and lower extremity weakness. Of note, her surgery was complicated by a cereberospinal fluid leak which was repaired intraoperatively.  Pt reports that she has had a poor appetite since a few days PTA. Per nursing notes pt is eating 10-50% of most meals; pt reports eating 25% of most meals. Despite poor PO intake, she denies weight loss, stating that she usually weighs 136 lbs. She is agreeable to drinking Ensure until appetite improves. She denies any nausea or abdominal pain. States that she was constipated, but had a BM last night which may help her appetite improve.    Labs reviewed.   Diet Order:  Diet regular Room service appropriate?: Yes; Fluid consistency:: Thin  Skin:  Wound (see comment) (closed incision on back)  Last BM:  2/3  Height:   Ht Readings from Last 1 Encounters:  12/25/15 4\' 11"  (1.499 m)    Weight:   Wt Readings from Last 1 Encounters:  12/25/15 160 lb 11.5 oz (72.9 kg)    Ideal Body Weight:  44.7 kg  BMI:  Body mass index is 32.44 kg/(m^2).  Estimated Nutritional Needs:   Kcal:  1500-1700  Protein:  80-90 grams  Fluid:  1.7 L/day  EDUCATION NEEDS:   No education needs identified at this time  [place], LDN Inpatient Clinical Dietitian Pager: [phone] After Hours Pager:  [phone]

## 2016-01-02 NOTE — Progress Notes (Signed)
Occupational Therapy Evaluation Patient Details Name: Alexandra Lindsey MRN: [id number] DOB: Jun 18, 1941 Today's Date: 01/02/2016    History of Present Illness Pt underwent L3-5 spinal fusion with intraoperative repair of CSF leak. Pt was discharged on 12/22/15 and returned with increased pain, weakness and incontinence. Returned to OR on 1/28 for repair of pseudomeningocele. PMH: Parkinsons   Clinical Impression   PTA, pt was recovering from a recent back surgery and required assist from husband for all ADLs and used rollator for mobility. Pt currently presents with severe lethargy, decreased short-term memory and ability to follow commands,  back pain, and bilateral LE weakness. Pt required min assist for mobility due to lethargy and balance impairments and min assist-mod assist for ADLs. Pt will benefit from continued acute OT to increase independence and safety with ADLs and mobility. Recommending SNF for post-acute rehab stay - pt and husband in agreement as pt's husband cannot provide the required level of assist at this time. Recommending 3in1 and RW to facilitate safe mobility and transfers at home.    Follow Up Recommendations  SNF;Supervision/Assistance - 24 hour    Equipment Recommendations  3 in 1 bedside comode;Other (comment) (RW-2 wheeled)    Recommendations for Other Services       Precautions / Restrictions Precautions Precautions: Fall;Back Precaution Booklet Issued: No Precaution Comments: pt needing review of precautions because she is very lethargic and having STM issues Required Braces or Orthoses: Spinal Brace Spinal Brace: Lumbar corset;Applied in sitting position Restrictions Weight Bearing Restrictions: No      Mobility Bed Mobility Overal bed mobility: Needs Assistance Bed Mobility: Sit to Supine     Supine to sit: Min assist Sit to supine: Min guard   General bed mobility comments: Min guard assist for safety. Pt did not complete log roll technique  despite verbal and tactile cues to do so.   Transfers Overall transfer level: Needs assistance Equipment used: Rolling walker (2 wheeled) Transfers: Sit to/from Stand Sit to Stand: Min assist         General transfer comment: Min assist for balance and safety. Verbal cues for safe hand placement and use of RW. Pt with severe L knee buckling and required min assist to maintain balance and for RW management this session.    Balance Overall balance assessment: Needs assistance Sitting-balance support: No upper extremity supported;Feet supported Sitting balance-Leahy Scale: Fair Sitting balance - Comments: able to sit without support but close guarding given b/c pt falling asleep every few seconds   Standing balance support: Bilateral upper extremity supported;During functional activity Standing balance-Leahy Scale: Poor Standing balance comment: Required bilateral UE support and cues to do so due to severe L knee buckling                            ADL Overall ADL's : Needs assistance/impaired     Grooming: Wash/dry hands;Wash/dry face;Minimal assistance;Cueing for safety;Standing Grooming Details (indicate cue type and reason): Cues for L knee buckling and to use RW for UE support to regain balance         Upper Body Dressing : Min guard;Sitting Upper Body Dressing Details (indicate cue type and reason): to don/doff back brace     Toilet Transfer: Minimal assistance;Cueing for safety;Ambulation;BSC;RW Toilet Transfer Details (indicate cue type and reason): Cues for safe hand placement and proper use of RW during transfers.  Toileting- Clothing Manipulation and Hygiene: Moderate assistance;Sit to/from stand;Cueing for safety Toileting - Clothing Manipulation  Details (indicate cue type and reason): For pericare - pt required bilateral UE support to maintain balance     Functional mobility during ADLs: Minimal assistance;Rolling walker;Cueing for safety General ADL  Comments: Pt extremely lethargic and has severe L knee buckling causing balance impairments. Pt needs min assist for balance. Difficulty following commands and maintaining increased level of arousal to assess LB ADLs safely.     Vision Additional Comments: Difficult to assess due to level of arousal and difficulty following commands   Perception     Praxis      Pertinent Vitals/Pain Pain Assessment: Faces Faces Pain Scale: Hurts little more Pain Location: back Pain Descriptors / Indicators: Sore Pain Intervention(s): Limited activity within patient's tolerance;Monitored during session;Premedicated before session;Repositioned     Hand Dominance Right   Extremity/Trunk Assessment Upper Extremity Assessment Upper Extremity Assessment: Generalized weakness   Lower Extremity Assessment Lower Extremity Assessment: Generalized weakness   Cervical / Trunk Assessment Cervical / Trunk Assessment: Kyphotic   Communication Communication Communication: No difficulties   Cognition Arousal/Alertness: Lethargic;Suspect due to medications Behavior During Therapy: Flat affect Overall Cognitive Status: Impaired/Different from baseline Area of Impairment: Attention;Memory;Following commands;Safety/judgement;Problem solving   Current Attention Level: Sustained Memory: Decreased short-term memory;Decreased recall of precautions Following Commands: Follows one step commands with increased time;Follows multi-step commands with increased time Safety/Judgement: Decreased awareness of safety;Decreased awareness of deficits   Problem Solving: Slow processing;Decreased initiation;Requires tactile cues;Requires verbal cues General Comments: Pt continues to be very lethargic and slow with thought processes movement patterns. Pt repeatedly asked therapist for name - decreased STM. Husband reports that pt is not at her baseline.   General Comments       Exercises       Shoulder Instructions       Home Living Family/patient expects to be discharged to:: Private residence Living Arrangements: Spouse/significant other Available Help at Discharge: Family;Available 24 hours/day Type of Home: House Home Access: Stairs to enter [employer] of Steps: 1 Entrance Stairs-Rails: None Home Layout: One level     Bathroom Shower/Tub: Tub/shower unit;Walk-in shower Shower/tub characteristics: Architectural technologist: Standard Bathroom Accessibility: Yes How Accessible: Accessible via walker Home Equipment: [place] - 4 wheels;Toilet riser          Prior Functioning/Environment Level of Independence: Needs assistance  Gait / Transfers Assistance Needed: Ambulating with rollator since surgery, husband says the pain was so severe she was uanble to mobilize very much ADL's / Homemaking Assistance Needed: husband has been assisting        OT Diagnosis: Generalized weakness;Acute pain;Cognitive deficits   OT Problem List: Decreased strength;Decreased range of motion;Decreased activity tolerance;Impaired balance (sitting and/or standing);Decreased coordination;Decreased safety awareness;Decreased knowledge of use of DME or AE;Decreased knowledge of precautions;Pain   OT Treatment/Interventions: Self-care/ADL training;Therapeutic exercise;Energy conservation;DME and/or AE instruction;Therapeutic activities;Patient/family education;Balance training;Cognitive remediation/compensation    OT Goals(Current goals can be found in the care plan section) Acute Rehab OT Goals Patient Stated Goal: to get better OT Goal Formulation: With patient Time For Goal Achievement: 01/16/16 Potential to Achieve Goals: Good ADL Goals Pt Will Perform Grooming: with supervision;standing Pt Will Perform Lower Body Bathing: with supervision;with adaptive equipment;sitting/lateral leans;sit to/from stand Pt Will Perform Lower Body Dressing: with supervision;with adaptive equipment;sitting/lateral  leans;sit to/from stand Pt Will Transfer to Toilet: with supervision;ambulating;bedside commode Pt Will Perform Toileting - Clothing Manipulation and hygiene: with supervision;with adaptive equipment;sitting/lateral leans;sit to/from stand Pt Will Perform Tub/Shower Transfer: Tub transfer;ambulating;3 in 1;rolling walker Additional ADL Goal #1: Pt will independently don/doff back  brace to increase independence with ADLs and mobility. Additional ADL Goal #2: Pt will demonstrate adherence to 3/3 back precautions to increase safety with ADLs and mobility.  OT Frequency: Min 2X/week   Barriers to D/C: Decreased caregiver support  Pt's husband is available 24/7, but cannot provide this level of assistance he reports       Co-evaluation              End of Session Equipment Utilized During Treatment: Gait belt;Rolling walker;Back brace Nurse Communication: Mobility status;Precautions  Activity Tolerance: Patient limited by lethargy Patient left: in bed;with call bell/phone within reach;with bed alarm set;with family/visitor present   Time: 1315-1344 OT Time Calculation (min): 29 min Charges:  OT General Charges $OT Visit: 1 Procedure OT Evaluation $OT Eval Moderate Complexity: 1 Procedure OT Treatments $Self Care/Home Management : 8-22 mins G-Codes:    Redmond Baseman, OTR/L [phone] 01/02/2016, 2:05 PM

## 2016-01-02 NOTE — Progress Notes (Signed)
No issues overnight. Pt has back soreness.  EXAM:  BP 100/42 mmHg  Pulse 88  Temp(Src) 98.9 F (37.2 C) (Oral)  Resp 18  Ht 4\' 11"  (1.499 m)  Wt 72.9 kg (160 lb 11.5 oz)  BMI 32.44 kg/m2  SpO2 96%  Awake, alert, oriented  Speech fluent, appropriate  CN grossly intact  5/5 BUE/BLE  Wound dry, no leak  IMPRESSION:  75 y.o. female s/p pseudomeningocele repair, wound remains dry  PLAN: - Will need to mobilize with PT/OT - May go home with HHPT/OT vs placement

## 2016-01-02 NOTE — Evaluation (Signed)
Physical Therapy Evaluation Patient Details Name: Alexandra Lindsey MRN: [id number] DOB: [birth year] Today's Date: 01/02/2016   History of Present Illness  Pt underwent L3-5 spinal fusion with intraoperative repair of CSF leak. Pt was discharged on 12/22/15 and returned with increased pain, weakness and incontinence. Returned to OR on 1/28 for repair of pseudomeningocele. PMH: Parkinsons  Clinical Impression  Pt admitted with above diagnosis. Pt currently with functional limitations due to the deficits listed below (see PT Problem List). Pt very lethargic throughout evaluation, required min A for safety with all activity and falling asleep every few seconds whenever sitting. Pt incontinent of bowel upon PT arrival.   Pt will benefit from skilled PT to increase their independence and safety with mobility to allow discharge to the venue listed below.       Follow Up Recommendations Supervision/Assistance - 24 hour;SNF, unless pt family prepared to give 24 hour assist and handle incontinence issues, then recommend HHPT.    Equipment Recommendations  None recommended by PT    Recommendations for Other Services       Precautions / Restrictions Precautions Precautions: Fall;Back Precaution Booklet Issued: No Precaution Comments: pt needing review of precautions because she is very lethargic and having STM issues Required Braces or Orthoses: Spinal Brace Spinal Brace: Lumbar corset;Applied in sitting position Restrictions Weight Bearing Restrictions: No      Mobility  Bed Mobility Overal bed mobility: Needs Assistance Bed Mobility: Supine to Sit     Supine to sit: Min assist     General bed mobility comments: pt unsure of how to begin transition. vc's for bridging knees, log rolling, grasping rail, pt needed cues for each step as well as min A to complete task  Transfers Overall transfer level: Needs assistance Equipment used: Rolling walker (2 wheeled) Transfers: Sit to/from  Stand Sit to Stand: Min assist         General transfer comment: pt stood from bed and BSC over toilet multiple times for bowel clean up, min A to steady but pt with sufficient power to stand. vc's multiple times for hand placement. Pt fatigues quickly in standing  Ambulation/Gait Ambulation/Gait assistance: Min assist Ambulation Distance (Feet): 20 Feet (10' x2) Assistive device: Rolling walker (2 wheeled) Gait Pattern/deviations: Step-through pattern;Shuffle;Decreased stride length;Trunk flexed;Festinating Gait velocity: very slow Gait velocity interpretation: <1.8 ft/sec, indicative of risk for recurrent falls General Gait Details: pt with typical parkinsonian gait pattern but able to increase step length and step height with vc's, as well as vc's for erect posture.   Stairs            Wheelchair Mobility    Modified Rankin (Stroke Patients Only)       Balance Overall balance assessment: Needs assistance Sitting-balance support: Single extremity supported Sitting balance-Leahy Scale: Fair Sitting balance - Comments: able to sit without support but close guarding given b/c pt falling asleep every few seconds   Standing balance support: Bilateral upper extremity supported Standing balance-Leahy Scale: Poor Standing balance comment: difficult to accurately assess with lethargy, required UE support at all times on eval                             Pertinent Vitals/Pain Pain Assessment: Faces Faces Pain Scale: Hurts little more Pain Location: back Pain Descriptors / Indicators: Sore (pulling) Pain Intervention(s): Limited activity within patient's tolerance;Monitored during session    Home Living Family/patient expects to be discharged to:: Private residence Living Arrangements:  Spouse/significant other Available Help at Discharge: Family;Available 24 hours/day Type of Home: House Home Access: Stairs to enter Entrance Stairs-Rails: None Entrance  Stairs-Number of Steps: 1 Home Layout: One level Home Equipment: Environmental consultant - 2 wheels      Prior Function Level of Independence: Needs assistance   Gait / Transfers Assistance Needed: pt has been ambulating with RW since surgery, she can't remember how much assistance she needed  ADL's / Homemaking Assistance Needed: husband has been assisting        Hand Dominance        Extremity/Trunk Assessment   Upper Extremity Assessment: Generalized weakness;Defer to OT evaluation           Lower Extremity Assessment: Generalized weakness      Cervical / Trunk Assessment: Kyphotic  Communication   Communication: No difficulties  Cognition Arousal/Alertness: Lethargic;Suspect due to medications Behavior During Therapy: Flat affect Overall Cognitive Status: Impaired/Different from baseline Area of Impairment: Memory;Problem solving;Following commands     Memory: Decreased short-term memory;Decreased recall of precautions Following Commands: Follows one step commands with increased time;Follows multi-step commands with increased time     Problem Solving: Slow processing General Comments: pt very lethargic and slow with all thought processes and mvmt patterns. Some of this may have been pre existing with Parkinsons but she relays that she is not at her baseline    General Comments General comments (skin integrity, edema, etc.): when pt stood from bed it was noted that she had begun to have a BM in bed, she was unaware. Ambulated to bathroom where she continued to have BM, at times knowing that she was going and at times unsure. Pt requires tot A for BM clean up. New gown donned    Exercises        Assessment/Plan    PT Assessment Patient needs continued PT services  PT Diagnosis Altered mental status;Difficulty walking;Abnormality of gait;Generalized weakness;Acute pain   PT Problem List Decreased strength;Decreased activity tolerance;Decreased balance;Decreased  mobility;Decreased cognition;Decreased knowledge of precautions;Pain  PT Treatment Interventions DME instruction;Gait training;Functional mobility training;Therapeutic activities;Therapeutic exercise;Balance training;Cognitive remediation;Patient/family education   PT Goals (Current goals can be found in the Care Plan section) Acute Rehab PT Goals Patient Stated Goal: none stated PT Goal Formulation: Patient unable to participate in goal setting Time For Goal Achievement: 01/16/16 Potential to Achieve Goals: Fair    Frequency Min 3X/week   Barriers to discharge        Co-evaluation               End of Session Equipment Utilized During Treatment: Gait belt;Back brace Activity Tolerance: Patient limited by lethargy Patient left: in chair;with chair alarm set;with call bell/phone within reach Nurse Communication: Mobility status;Other (comment) (bowel incontinence)         Time: [id number] PT Time Calculation (min) (ACUTE ONLY): 28 min   Charges:   PT Evaluation $PT Eval Moderate Complexity: 1 Procedure PT Treatments $Therapeutic Activity: 8-22 mins   PT G Codes:       Leighton Roach, PT  Acute Rehab Services  [place], [place] 01/02/2016, 1:18 PM

## 2016-01-02 NOTE — Progress Notes (Signed)
OT Cancellation Note  Patient Details Name: Alexandra Lindsey MRN: [id number] DOB: 1941-05-10   Cancelled Treatment:    Reason Eval/Treat Not Completed: Pain limiting ability to participate (pt reporting 8/10 pain). Pt requesting OT come back later today. Will attempt to see later today if time allows.  Redmond Baseman, OTR/L Pager: [phone] 01/02/2016, 9:46 AM

## 2016-01-03 DIAGNOSIS — G8929 Other chronic pain: Secondary | ICD-10-CM | POA: Diagnosis not present

## 2016-01-03 DIAGNOSIS — G20 Parkinson's disease: Secondary | ICD-10-CM | POA: Diagnosis not present

## 2016-01-03 DIAGNOSIS — Z981 Arthrodesis status: Secondary | ICD-10-CM | POA: Diagnosis not present

## 2016-01-03 DIAGNOSIS — M6281 Muscle weakness (generalized): Secondary | ICD-10-CM | POA: Diagnosis not present

## 2016-01-03 DIAGNOSIS — M545 Low back pain: Secondary | ICD-10-CM | POA: Diagnosis not present

## 2016-01-03 DIAGNOSIS — M62838 Other muscle spasm: Secondary | ICD-10-CM | POA: Diagnosis not present

## 2016-01-03 DIAGNOSIS — M4806 Spinal stenosis, lumbar region: Secondary | ICD-10-CM | POA: Diagnosis not present

## 2016-01-03 DIAGNOSIS — R32 Unspecified urinary incontinence: Secondary | ICD-10-CM | POA: Diagnosis not present

## 2016-01-03 DIAGNOSIS — M5136 Other intervertebral disc degeneration, lumbar region: Secondary | ICD-10-CM | POA: Diagnosis not present

## 2016-01-03 DIAGNOSIS — E7800 Pure hypercholesterolemia, unspecified: Secondary | ICD-10-CM | POA: Diagnosis not present

## 2016-01-03 DIAGNOSIS — S064X9A Epidural hemorrhage with loss of consciousness of unspecified duration, initial encounter: Secondary | ICD-10-CM | POA: Diagnosis not present

## 2016-01-03 DIAGNOSIS — R261 Paralytic gait: Secondary | ICD-10-CM | POA: Diagnosis not present

## 2016-01-03 DIAGNOSIS — M25551 Pain in right hip: Secondary | ICD-10-CM | POA: Diagnosis not present

## 2016-01-03 DIAGNOSIS — R2689 Other abnormalities of gait and mobility: Secondary | ICD-10-CM | POA: Diagnosis not present

## 2016-01-03 DIAGNOSIS — M549 Dorsalgia, unspecified: Secondary | ICD-10-CM | POA: Diagnosis not present

## 2016-01-03 DIAGNOSIS — K219 Gastro-esophageal reflux disease without esophagitis: Secondary | ICD-10-CM | POA: Diagnosis not present

## 2016-01-03 DIAGNOSIS — E569 Vitamin deficiency, unspecified: Secondary | ICD-10-CM | POA: Diagnosis not present

## 2016-01-03 DIAGNOSIS — M4726 Other spondylosis with radiculopathy, lumbar region: Secondary | ICD-10-CM | POA: Diagnosis not present

## 2016-01-03 MED ORDER — BISACODYL 5 MG PO TBEC: 5.0000 mg | DELAYED_RELEASE_TABLET | Freq: Every day | ORAL | Status: DC | PRN

## 2016-01-03 MED ORDER — OXYCODONE-ACETAMINOPHEN 5-325 MG PO TABS: 1.0000 | ORAL_TABLET | ORAL | Status: DC | PRN

## 2016-01-03 MED ORDER — SENNA 8.6 MG PO TABS: 1.0000 | ORAL_TABLET | Freq: Two times a day (BID) | ORAL | Status: DC

## 2016-01-03 MED ORDER — DIAZEPAM 5 MG PO TABS: 5.0000 mg | ORAL_TABLET | Freq: Four times a day (QID) | ORAL | Status: DC | PRN

## 2016-01-03 MED ORDER — DOCUSATE SODIUM 100 MG PO CAPS: 100.0000 mg | ORAL_CAPSULE | Freq: Two times a day (BID) | ORAL | Status: DC

## 2016-01-03 NOTE — Discharge Summary (Signed)
Date of Admission: 12/24/15  Date of Discharge: 01/03/2016  Admission Diagnosis: Pseudomeningocele, cauda equina syndrome  Discharge Diagnosis: Same  Procedure Performed: Complex wound revision, repair of CSF leak, placement of lumbar drain  Attending: Keegan Bensch/Nudelman  Hospital Course:  She was admitted from the ER with the above diagnosis.  She was taken to the OR for the above procedure.  She was maintained flat in the ICU and allowed to drain from the lumbar drain.  After several days her lumbar drain was removed.  She had some leaking from around the lumbar drain exit site prior to removal as well as after it was removed.  Her head was gradually allowed to be elevated and her wound remained flat and dry.  Therapy has recommended a nursing facility.   She is discharged in stable medical condition.  Discharge Medications: Resume home meds, percocet for pain, flexeril and valium for spasms  Follow up: Dr. Sherwood Gambler in 2-3 weeks

## 2016-01-03 NOTE — Care Management Important Message (Signed)
Important Message  Patient Details  Name: Alexandra Lindsey MRN: [id number] Date of Birth: Feb 20, 1941   Medicare Important Message Given:  Yes    Louanne Belton 01/03/2016, 11:46 AMImportant Message  Patient Details  Name: Alexandra Lindsey MRN: [id number] Date of Birth: 1941/10/13   Medicare Important Message Given:  Yes    Medhansh Brinkmeier G 01/03/2016, 11:46 AM

## 2016-01-03 NOTE — [place] FL2 (Signed)
[place] MEDICAID FL2 LEVEL OF CARE SCREENING TOOL     IDENTIFICATION  Patient Name: Alexandra Lindsey Birthdate: 03-28-1941 Sex: female Admission Date (Current Location): 12/24/2015  [facility] and [place] Number:  Herbalist and Address:  [facility] [place], [place] [address], [place], [place] [address]      Provider Number: [id number]  Attending Physician Name and Address:  Jovita Gamma, MD  Relative Name and Phone Number:       Current Level of Care: Hospital Recommended Level of Care: [place] Prior Approval Number:    Date Approved/Denied:   PASRR Number: [id number]  Discharge Plan: SNF    Current Diagnoses: Patient Active Problem List   Diagnosis Date Noted  . 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 Lumbar radiculopathy 09/13/2015  . Greater trochanteric bursitis of right hip 08/04/2015  . Greater trochanteric bursitis of left hip 02/17/2015  . Piriformis syndrome of left side 11/05/2014  . SI (sacroiliac) joint dysfunction 11/05/2014  . Vitamin D deficiency 09/06/2014  . Medication management 09/06/2014  . Hypertension   . GERD (gastroesophageal reflux disease)   . Prediabetes   . 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 Parkinson's disease ([place])   . 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 Hyperlipidemia 10/07/2012    Orientation RESPIRATION BLADDER Height & Weight     Self, Time, Situation, Place  Normal Continent Weight: 160 lb 11.5 oz (72.9 kg) Height:  4\' 11"  (149.9 cm)  BEHAVIORAL SYMPTOMS/MOOD NEUROLOGICAL BOWEL NUTRITION STATUS   (NONE )  (NONE) Continent Diet (REGULAR )  AMBULATORY STATUS COMMUNICATION OF NEEDS Skin   Extensive Assist Verbally Surgical wounds                       Personal Care Assistance Level of Assistance  Dressing, Bathing Bathing Assistance: Maximum assistance   Dressing Assistance: Maximum assistance      Functional Limitations Info   (NONE)          SPECIAL CARE FACTORS FREQUENCY  PT (By licensed PT), OT (By licensed OT)     PT Frequency: 3 OT Frequency: 2            Contractures      Additional Factors Info  Code Status, Allergies Code Status Info: FULL CODE  Allergies Info: Macrobid, Baclofen, Gabapentin, Cephalosporins, Codeine, Darvocet, Levaquin, Penicillins, Selegiline Hcl, Tramadol, Triamcinolone           Current Medications (01/03/2016):  This is the current hospital active medication list Current Facility-Administered Medications  Medication Dose Route Frequency Provider Last Rate Last Dose  . 0.9 %  sodium chloride infusion  250 mL Intravenous Continuous Kevan Ny Ditty, MD      . acetaminophen (TYLENOL) tablet 650 mg  650 mg Oral Q4H PRN Kevan Ny Ditty, MD   650 mg at 12/31/15 0003   Or  . acetaminophen (TYLENOL) suppository 650 mg  650 mg Rectal Q4H PRN Kevan Ny Ditty, MD      . antiseptic oral rinse (CPC / CETYLPYRIDINIUM CHLORIDE 0.05%) solution 7 mL  7 mL Mouth Rinse BID Kevan Ny Ditty, MD   7 mL at 01/03/16 0921  . bisacodyl (DULCOLAX) EC tablet 5 mg  5 mg Oral Daily PRN Kevan Ny Ditty, MD      . carbidopa-levodopa (SINEMET IR) 25-100 MG per tablet immediate release 1 tablet  1 tablet Oral 6 times per day Jovita Gamma, MD  1 tablet at 01/03/16 0803  . cholecalciferol (VITAMIN D) tablet 5,000 Units  5,000 Units Oral Daily Kevan Ny Ditty, MD   5,000 Units at 01/03/16 [id number]  . cyanocobalamin tablet 500 mcg  500 mcg Oral Daily Kevan Ny Ditty, MD   500 mcg at 01/03/16 [id number]  . cyclobenzaprine (FLEXERIL) tablet 5 mg  5 mg Oral TID PRN Kevan Ny Ditty, MD   5 mg at 12/27/15 2233  . dextrose 5 % and 0.45 % NaCl with KCl 20 mEq/L infusion   Intravenous Continuous Jovita Gamma, MD 50 mL/hr at 01/01/16 2258    . diazepam (VALIUM) tablet 5 mg  5 mg Oral Q6H PRN Ashok Pall, MD   5 mg at 12/27/15 0600  . docusate  sodium (COLACE) capsule 100 mg  100 mg Oral BID Kevan Ny Ditty, MD   100 mg at 01/03/16 [id number]  . famotidine (PEPCID) tablet 20 mg  20 mg Oral BID Kevan Ny Ditty, MD   20 mg at 01/03/16 [id number]  . feeding supplement (ENSURE ENLIVE) (ENSURE ENLIVE) liquid 237 mL  237 mL Oral BID BM Consuella Lose, MD   237 mL at 01/03/16 0922  . gabapentin (NEURONTIN) capsule 300 mg  300 mg Oral BID Jovita Gamma, MD   300 mg at 01/03/16 [id number]  . HYDROcodone-acetaminophen (NORCO/VICODIN) 5-325 MG per tablet 1-2 tablet  1-2 tablet Oral Q4H PRN Tamala Fothergill, MD   2 tablet at 01/01/16 1342  . HYDROmorphone (DILAUDID) injection 0.5-1 mg  0.5-1 mg Intravenous Q2H PRN Kevan Ny Ditty, MD   1 mg at 12/27/15 1113  . magnesium oxide (MAG-OX) tablet 400 mg  400 mg Oral Daily Kevan Ny Ditty, MD   400 mg at 01/03/16 [id number]  . menthol-cetylpyridinium (CEPACOL) lozenge 3 mg  1 lozenge Oral PRN Kevan Ny Ditty, MD       Or  . phenol (CHLORASEPTIC) mouth spray 1 spray  1 spray Mouth/Throat PRN Kevan Ny Ditty, MD      . methocarbamol (ROBAXIN) tablet 750 mg  750 mg Oral Q6H PRN Kevan Ny Ditty, MD   750 mg at 12/28/15 0957   Or  . methocarbamol (ROBAXIN) 500 mg in dextrose 5 % 50 mL IVPB  500 mg Intravenous Q6H PRN Kevan Ny Ditty, MD   500 mg at 12/27/15 0106  . ondansetron (ZOFRAN) injection 4 mg  4 mg Intravenous Q4H PRN Kevan Ny Ditty, MD      . oxyCODONE (OXYCONTIN) 12 hr tablet 10 mg  10 mg Oral Q12H Jovita Gamma, MD   10 mg at 01/03/16 [id number]  . oxyCODONE-acetaminophen (PERCOCET/ROXICET) 5-325 MG per tablet 1-2 tablet  1-2 tablet Oral Q4H PRN Tamala Fothergill, MD   2 tablet at 12/26/15 0617  . pantoprazole (PROTONIX) EC tablet 40 mg  40 mg Oral Daily Jake Church Masters, RPH   40 mg at 01/02/16 2300  . polyethylene glycol (MIRALAX / GLYCOLAX) packet 17 g  17 g Oral Daily Kevan Ny Ditty, MD   17 g at 01/03/16 [id number]  . pramipexole (MIRAPEX) tablet 1 mg  1 mg Oral TID  Kevan Ny Ditty, MD   1 mg at 01/03/16 [id number]  . senna (SENOKOT) tablet 8.6 mg  1 tablet Oral BID Kevan Ny Ditty, MD   8.6 mg at 01/03/16 [id number]  . sodium chloride flush (NS) 0.9 % injection 3 mL  3 mL Intravenous Q12H Kevan Ny Ditty, MD   3 mL at 01/03/16 [id number]  . sodium chloride flush (  NS) 0.9 % injection 3 mL  3 mL Intravenous PRN Kevan Ny Ditty, MD      . sodium phosphate (FLEET) 7-19 GM/118ML enema 1 enema  1 enema Rectal Once PRN Tamala Fothergill, MD         Discharge Medications: Please see discharge summary for a list of discharge medications.  Relevant Imaging Results:  Relevant Lab Results:   Additional Information [id number]  Rozell Searing, LCSW

## 2016-01-03 NOTE — Clinical Social Work Placement (Signed)
   CLINICAL SOCIAL WORK PLACEMENT  NOTE  Date:  01/03/2016  Patient Details  Name: Alexandra Lindsey MRN: [id number] Date of Birth: 1940/12/27  Clinical Social Work is seeking post-discharge placement for this patient at the [place] level of care (*CSW will initial, date and re-position this form in  chart as items are completed):  Yes   Patient/family provided with [place] Work Department's list of facilities offering this level of care within the geographic area requested by the patient (or if unable, by the patient's family).  Yes   Patient/family informed of their freedom to choose among providers that offer the needed level of care, that participate in Medicare, Medicaid or managed care program needed by the patient, have an available bed and are willing to accept the patient.  Yes   Patient/family informed of [facility]'s ownership interest in [facility] and [facility], as well as of the fact that they are under no obligation to receive care at these facilities.  PASRR submitted to EDS on 01/03/16     PASRR number received on 01/03/16     Existing PASRR number confirmed on       FL2 transmitted to all facilities in geographic area requested by pt/family on 01/03/16     FL2 transmitted to all facilities within larger geographic area on 01/03/16     Patient informed that his/her managed care company has contracts with or will negotiate with certain facilities, including the following:        Yes   Patient/family informed of bed offers received.  Patient chooses bed at  [facility] )     Physician recommends and patient chooses bed at      Patient to be transferred to  [facility] ) on 01/03/16.  Patient to be transferred to facility by  [facility] )     Patient family notified on 01/03/16 of transfer.  Name of family member notified:   (Pt's husband )     PHYSICIAN Please prepare priority discharge summary,  including medications, Please sign FL2     Additional Comment:    _______________________________________________ Rozell Searing, LCSW 01/03/2016, 1:42 PM

## 2016-01-03 NOTE — Progress Notes (Signed)
No acute events AVSS Awake and alert Full strength in legs Incision c/d/i and flat Stable Hopefully to SNF today

## 2016-01-03 NOTE — Clinical Social Work Note (Signed)
Clinical Social Worker met with patient and husband present at bedside in reference to post-acute placement for SNF. CSW reviewed and presented SNF list with bed offers. Patient chooses semi-private room at [facility]. Pt and husband polite, pleasant and appreciated social work intervention. Patient is NOT appropriate for CIR/IP REHAB. Per MD, patient will d/c after 4PM for monitoring.   No further concerns reported by patient of family at this time.   Clinical Social Worker facilitated patient discharge including contacting patient family and facility to confirm patient discharge plans.  Clinical information faxed to facility and family agreeable with plan.  CSW arranged ambulance transport via [place] to [facility].  RN to call report prior to discharge.  Clinical Social Worker will sign off for now as social work intervention is no longer needed. Please consult [place] again if new need arises.  Glendon Axe, MSW, LCSWA [phone] 01/03/2016 1:43 PM

## 2016-01-03 NOTE — Progress Notes (Signed)
Writer called into patient's room at 1330 and found patient being assisted back to bed by husband and Education officer, museum. Patient's husband stated he stepped out of the room to make a phone call and went back in room to find patient on the floor besides bed and sink. Assessed and no apparent injury noted at the time. MD on call notified. Patient reminded to call for assistance before trying to get up and also husband educated about fall risk and patient's safety.

## 2016-01-03 NOTE — Progress Notes (Signed)
I was asked to review chart to assess if pt would be appropriate for an inpt rehab admission rather than SNF. Also discussed with SW. Feel pt is most appropriate for SNF level of intensity of therapy as recommended by PT and OT. [id number]

## 2016-01-03 NOTE — Progress Notes (Signed)
Patient is discharged from room 5M03 at this time. Alert and in stable condition. Assessed prior discharge for any post fall injury and non noted. Patient stated she is doing fine. Report called to receiving nurse Trilby Leaver, RN at [facility]. Transported out of unit via stretcher by PT[facility] with husband and all belongings at side.

## 2016-01-03 NOTE — Clinical Social Work Note (Signed)
[facility] arranged for 4:30PM.  Glendon Axe, MSW, LCSWA [phone] 01/03/2016 1:48 PM

## 2016-01-04 DIAGNOSIS — G20 Parkinson's disease: Secondary | ICD-10-CM | POA: Diagnosis not present

## 2016-01-04 DIAGNOSIS — R261 Paralytic gait: Secondary | ICD-10-CM | POA: Diagnosis not present

## 2016-01-04 DIAGNOSIS — M4726 Other spondylosis with radiculopathy, lumbar region: Secondary | ICD-10-CM | POA: Diagnosis not present

## 2016-01-06 DIAGNOSIS — M4726 Other spondylosis with radiculopathy, lumbar region: Secondary | ICD-10-CM | POA: Diagnosis not present

## 2016-01-06 DIAGNOSIS — M5136 Other intervertebral disc degeneration, lumbar region: Secondary | ICD-10-CM | POA: Diagnosis not present

## 2016-01-06 DIAGNOSIS — Z981 Arthrodesis status: Secondary | ICD-10-CM | POA: Diagnosis not present

## 2016-01-15 DIAGNOSIS — M6281 Muscle weakness (generalized): Secondary | ICD-10-CM | POA: Diagnosis not present

## 2016-01-15 DIAGNOSIS — S064X9A Epidural hemorrhage with loss of consciousness of unspecified duration, initial encounter: Secondary | ICD-10-CM | POA: Diagnosis not present

## 2016-01-15 DIAGNOSIS — Z4789 Encounter for other orthopedic aftercare: Secondary | ICD-10-CM | POA: Diagnosis not present

## 2016-01-15 DIAGNOSIS — G20 Parkinson's disease: Secondary | ICD-10-CM | POA: Diagnosis not present

## 2016-01-15 DIAGNOSIS — G8929 Other chronic pain: Secondary | ICD-10-CM | POA: Diagnosis not present

## 2016-01-15 DIAGNOSIS — M4326 Fusion of spine, lumbar region: Secondary | ICD-10-CM | POA: Diagnosis not present

## 2016-01-17 DIAGNOSIS — G20 Parkinson's disease: Secondary | ICD-10-CM | POA: Diagnosis not present

## 2016-01-17 DIAGNOSIS — S064X9A Epidural hemorrhage with loss of consciousness of unspecified duration, initial encounter: Secondary | ICD-10-CM | POA: Diagnosis not present

## 2016-01-17 DIAGNOSIS — M4326 Fusion of spine, lumbar region: Secondary | ICD-10-CM | POA: Diagnosis not present

## 2016-01-17 DIAGNOSIS — G8929 Other chronic pain: Secondary | ICD-10-CM | POA: Diagnosis not present

## 2016-01-17 DIAGNOSIS — Z4789 Encounter for other orthopedic aftercare: Secondary | ICD-10-CM | POA: Diagnosis not present

## 2016-01-17 DIAGNOSIS — M6281 Muscle weakness (generalized): Secondary | ICD-10-CM | POA: Diagnosis not present

## 2016-01-18 DIAGNOSIS — M4726 Other spondylosis with radiculopathy, lumbar region: Secondary | ICD-10-CM | POA: Diagnosis not present

## 2016-01-18 DIAGNOSIS — M5416 Radiculopathy, lumbar region: Secondary | ICD-10-CM | POA: Diagnosis not present

## 2016-01-18 DIAGNOSIS — M5136 Other intervertebral disc degeneration, lumbar region: Secondary | ICD-10-CM | POA: Diagnosis not present

## 2016-01-18 DIAGNOSIS — Z981 Arthrodesis status: Secondary | ICD-10-CM | POA: Diagnosis not present

## 2016-01-19 DIAGNOSIS — M6281 Muscle weakness (generalized): Secondary | ICD-10-CM | POA: Diagnosis not present

## 2016-01-19 DIAGNOSIS — S064X9A Epidural hemorrhage with loss of consciousness of unspecified duration, initial encounter: Secondary | ICD-10-CM | POA: Diagnosis not present

## 2016-01-19 DIAGNOSIS — G8929 Other chronic pain: Secondary | ICD-10-CM | POA: Diagnosis not present

## 2016-01-19 DIAGNOSIS — G20 Parkinson's disease: Secondary | ICD-10-CM | POA: Diagnosis not present

## 2016-01-19 DIAGNOSIS — M4326 Fusion of spine, lumbar region: Secondary | ICD-10-CM | POA: Diagnosis not present

## 2016-01-19 DIAGNOSIS — Z4789 Encounter for other orthopedic aftercare: Secondary | ICD-10-CM | POA: Diagnosis not present

## 2016-01-21 DIAGNOSIS — G8929 Other chronic pain: Secondary | ICD-10-CM | POA: Diagnosis not present

## 2016-01-21 DIAGNOSIS — S064X9A Epidural hemorrhage with loss of consciousness of unspecified duration, initial encounter: Secondary | ICD-10-CM | POA: Diagnosis not present

## 2016-01-21 DIAGNOSIS — Z4789 Encounter for other orthopedic aftercare: Secondary | ICD-10-CM | POA: Diagnosis not present

## 2016-01-21 DIAGNOSIS — M6281 Muscle weakness (generalized): Secondary | ICD-10-CM | POA: Diagnosis not present

## 2016-01-21 DIAGNOSIS — M4326 Fusion of spine, lumbar region: Secondary | ICD-10-CM | POA: Diagnosis not present

## 2016-01-21 DIAGNOSIS — G20 Parkinson's disease: Secondary | ICD-10-CM | POA: Diagnosis not present

## 2016-01-27 DIAGNOSIS — S064X9A Epidural hemorrhage with loss of consciousness of unspecified duration, initial encounter: Secondary | ICD-10-CM | POA: Diagnosis not present

## 2016-01-27 DIAGNOSIS — G8929 Other chronic pain: Secondary | ICD-10-CM | POA: Diagnosis not present

## 2016-01-27 DIAGNOSIS — G20 Parkinson's disease: Secondary | ICD-10-CM | POA: Diagnosis not present

## 2016-01-27 DIAGNOSIS — Z4789 Encounter for other orthopedic aftercare: Secondary | ICD-10-CM | POA: Diagnosis not present

## 2016-01-27 DIAGNOSIS — M4326 Fusion of spine, lumbar region: Secondary | ICD-10-CM | POA: Diagnosis not present

## 2016-01-27 DIAGNOSIS — M6281 Muscle weakness (generalized): Secondary | ICD-10-CM | POA: Diagnosis not present

## 2016-01-28 DIAGNOSIS — G8929 Other chronic pain: Secondary | ICD-10-CM | POA: Diagnosis not present

## 2016-01-28 DIAGNOSIS — G20 Parkinson's disease: Secondary | ICD-10-CM | POA: Diagnosis not present

## 2016-01-28 DIAGNOSIS — S064X9A Epidural hemorrhage with loss of consciousness of unspecified duration, initial encounter: Secondary | ICD-10-CM | POA: Diagnosis not present

## 2016-01-28 DIAGNOSIS — M6281 Muscle weakness (generalized): Secondary | ICD-10-CM | POA: Diagnosis not present

## 2016-01-28 DIAGNOSIS — Z4789 Encounter for other orthopedic aftercare: Secondary | ICD-10-CM | POA: Diagnosis not present

## 2016-01-28 DIAGNOSIS — M4326 Fusion of spine, lumbar region: Secondary | ICD-10-CM | POA: Diagnosis not present

## 2016-01-31 DIAGNOSIS — M5416 Radiculopathy, lumbar region: Secondary | ICD-10-CM | POA: Diagnosis not present

## 2016-01-31 DIAGNOSIS — M4726 Other spondylosis with radiculopathy, lumbar region: Secondary | ICD-10-CM | POA: Diagnosis not present

## 2016-01-31 DIAGNOSIS — Z981 Arthrodesis status: Secondary | ICD-10-CM | POA: Diagnosis not present

## 2016-01-31 DIAGNOSIS — M5136 Other intervertebral disc degeneration, lumbar region: Secondary | ICD-10-CM | POA: Diagnosis not present

## 2016-02-01 DIAGNOSIS — S064X9A Epidural hemorrhage with loss of consciousness of unspecified duration, initial encounter: Secondary | ICD-10-CM | POA: Diagnosis not present

## 2016-02-01 DIAGNOSIS — M6281 Muscle weakness (generalized): Secondary | ICD-10-CM | POA: Diagnosis not present

## 2016-02-01 DIAGNOSIS — G8929 Other chronic pain: Secondary | ICD-10-CM | POA: Diagnosis not present

## 2016-02-01 DIAGNOSIS — G20 Parkinson's disease: Secondary | ICD-10-CM | POA: Diagnosis not present

## 2016-02-01 DIAGNOSIS — Z4789 Encounter for other orthopedic aftercare: Secondary | ICD-10-CM | POA: Diagnosis not present

## 2016-02-01 DIAGNOSIS — M4326 Fusion of spine, lumbar region: Secondary | ICD-10-CM | POA: Diagnosis not present

## 2016-02-02 DIAGNOSIS — M4326 Fusion of spine, lumbar region: Secondary | ICD-10-CM | POA: Diagnosis not present

## 2016-02-02 DIAGNOSIS — M6281 Muscle weakness (generalized): Secondary | ICD-10-CM | POA: Diagnosis not present

## 2016-02-02 DIAGNOSIS — Z4789 Encounter for other orthopedic aftercare: Secondary | ICD-10-CM | POA: Diagnosis not present

## 2016-02-02 DIAGNOSIS — S064X9A Epidural hemorrhage with loss of consciousness of unspecified duration, initial encounter: Secondary | ICD-10-CM | POA: Diagnosis not present

## 2016-02-02 DIAGNOSIS — G20 Parkinson's disease: Secondary | ICD-10-CM | POA: Diagnosis not present

## 2016-02-02 DIAGNOSIS — G8929 Other chronic pain: Secondary | ICD-10-CM | POA: Diagnosis not present

## 2016-02-03 ENCOUNTER — Ambulatory Visit: Payer: Medicare Other | Admitting: Neurology

## 2016-02-14 ENCOUNTER — Encounter: Payer: Self-pay | Admitting: Internal Medicine

## 2016-02-14 ENCOUNTER — Ambulatory Visit (INDEPENDENT_AMBULATORY_CARE_PROVIDER_SITE_OTHER): Payer: Medicare Other | Admitting: Internal Medicine

## 2016-02-14 VITALS — BP 116/64 | HR 78 | Temp 98.0°F | Resp 16 | Ht 59.25 in | Wt 142.0 lb

## 2016-02-14 DIAGNOSIS — Z79899 Other long term (current) drug therapy: Secondary | ICD-10-CM

## 2016-02-14 DIAGNOSIS — R7309 Other abnormal glucose: Secondary | ICD-10-CM | POA: Diagnosis not present

## 2016-02-14 DIAGNOSIS — E785 Hyperlipidemia, unspecified: Secondary | ICD-10-CM | POA: Diagnosis not present

## 2016-02-14 DIAGNOSIS — I10 Essential (primary) hypertension: Secondary | ICD-10-CM | POA: Diagnosis not present

## 2016-02-14 DIAGNOSIS — R7303 Prediabetes: Secondary | ICD-10-CM | POA: Diagnosis not present

## 2016-02-14 DIAGNOSIS — G972 Intracranial hypotension following ventricular shunting: Secondary | ICD-10-CM

## 2016-02-14 DIAGNOSIS — G9782 Other postprocedural complications and disorders of nervous system: Secondary | ICD-10-CM

## 2016-02-14 DIAGNOSIS — E559 Vitamin D deficiency, unspecified: Secondary | ICD-10-CM | POA: Diagnosis not present

## 2016-02-14 LAB — TSH: TSH: 1.41 mIU/L

## 2016-02-14 LAB — CBC WITH DIFFERENTIAL/PLATELET
BASOS ABS: 0.1 10*3/uL (ref 0.0–0.1)
BASOS PCT: 1 % (ref 0–1)
Eosinophils Absolute: 0.1 10*3/uL (ref 0.0–0.7)
Eosinophils Relative: 2 % (ref 0–5)
HCT: 34 % — ABNORMAL LOW (ref 36.0–46.0)
HEMOGLOBIN: 11.1 g/dL — AB (ref 12.0–15.0)
Lymphocytes Relative: 34 % (ref 12–46)
Lymphs Abs: 2 10*3/uL (ref 0.7–4.0)
MCH: 29.8 pg (ref 26.0–34.0)
MCHC: 32.6 g/dL (ref 30.0–36.0)
MCV: 91.4 fL (ref 78.0–100.0)
MONOS PCT: 10 % (ref 3–12)
MPV: 9.9 fL (ref 8.6–12.4)
Monocytes Absolute: 0.6 10*3/uL (ref 0.1–1.0)
NEUTROS ABS: 3.1 10*3/uL (ref 1.7–7.7)
NEUTROS PCT: 53 % (ref 43–77)
PLATELETS: 325 10*3/uL (ref 150–400)
RBC: 3.72 MIL/uL — ABNORMAL LOW (ref 3.87–5.11)
RDW: 14 % (ref 11.5–15.5)
WBC: 5.8 10*3/uL (ref 4.0–10.5)

## 2016-02-14 LAB — HEPATIC FUNCTION PANEL
ALBUMIN: 3.9 g/dL (ref 3.6–5.1)
ALK PHOS: 94 U/L (ref 33–130)
ALT: 4 U/L — ABNORMAL LOW (ref 6–29)
AST: 12 U/L (ref 10–35)
BILIRUBIN INDIRECT: 0.3 mg/dL (ref 0.2–1.2)
BILIRUBIN TOTAL: 0.4 mg/dL (ref 0.2–1.2)
Bilirubin, Direct: 0.1 mg/dL (ref <=0.2)
TOTAL PROTEIN: 6 g/dL — AB (ref 6.1–8.1)

## 2016-02-14 LAB — BASIC METABOLIC PANEL WITH GFR
BUN: 23 mg/dL (ref 7–25)
CHLORIDE: 106 mmol/L (ref 98–110)
CO2: 25 mmol/L (ref 20–31)
CREATININE: 0.8 mg/dL (ref 0.60–0.93)
Calcium: 9.2 mg/dL (ref 8.6–10.4)
GFR, EST AFRICAN AMERICAN: 85 mL/min (ref >=60)
GFR, EST NON AFRICAN AMERICAN: 73 mL/min (ref >=60)
Glucose, Bld: 85 mg/dL (ref 65–99)
POTASSIUM: 4.2 mmol/L (ref 3.5–5.3)
SODIUM: 140 mmol/L (ref 135–146)

## 2016-02-14 LAB — LIPID PANEL
CHOL/HDL RATIO: 3.4 ratio (ref <=5.0)
Cholesterol: 182 mg/dL (ref 125–200)
HDL: 54 mg/dL (ref >=46)
LDL CALC: 107 mg/dL (ref <130)
Triglycerides: 106 mg/dL (ref <150)
VLDL: 21 mg/dL (ref <30)

## 2016-02-14 LAB — HEMOGLOBIN A1C
HEMOGLOBIN A1C: 5.8 % — AB (ref <5.7)
MEAN PLASMA GLUCOSE: 120 mg/dL — AB (ref <117)

## 2016-02-14 NOTE — Progress Notes (Signed)
Assessment and Plan:  Hypertension:  -Continue medication,  -monitor blood pressure at home.  -Continue DASH diet.   -Reminder to go to the ER if any CP, SOB, nausea, dizziness, severe HA, changes vision/speech, left arm numbness and tingling, and jaw pain.  Cholesterol: -Continue diet and exercise.  -Check cholesterol.   Pre-diabetes: -Continue diet and exercise.  -Check A1C  Vitamin D Def: -check level -continue medications.   Recent lumbar fusion -encouraged continued PT -incision well healing -follow-up with neurosurgeon -likely source of recent weight loss  Insomnia -try benadryl 50 mg or melatonin 10 mg 30 min prior to bedtime -refuses prescriptions currently.    Continue diet and meds as discussed. Further disposition pending results of labs.  HPI 75 y.o. female  presents for 3 month follow up with hypertension, hyperlipidemia, prediabetes and vitamin D   Her blood pressure has been controlled at home, today their BP is BP: 116/64 mmHg.   She does not workout. She denies chest pain, shortness of breath, dizziness.   She is on cholesterol medication and denies myalgias. Her cholesterol is at goal. The cholesterol last visit was:   Lab Results  Component Value Date   CHOL 199 11/10/2015   HDL 52 11/10/2015   LDLCALC 119 11/10/2015   TRIG 138 11/10/2015   CHOLHDL 3.8 11/10/2015     She has been working on diet and exercise for prediabetes, and denies foot ulcerations, hyperglycemia, hypoglycemia , increased appetite, nausea, paresthesia of the feet, polydipsia, polyuria, visual disturbances, vomiting and weight loss. Last A1C in the office was:  Lab Results  Component Value Date   HGBA1C 5.8* 11/10/2015    Patient is on Vitamin D supplement.  Lab Results  Component Value Date   VD25OH 42 11/10/2015     She did have lumbar fusion and had a hemangioma removed and did have a CSF leak after the procedure.  She did have to have some emergency surgery and stayed  several days in the ICU and also had to go to rehab after.  She reports that since having the surgery she has been pain free.    She is struggling with sleeping.  She wakes up at 12 or 2:30.  She does get up and goes to the recliner and sleeps the rest of the evening in recliner.  She reports that she is more comfortable in the recliner.  She is not sure whether her breathing is waking her up at nighttime.  She does take magnesium but has never tried taking anything.    Current Medications:  Current Outpatient Prescriptions on File Prior to Visit  Medication Sig Dispense Refill  . carbidopa-levodopa (SINEMET IR) 25-100 MG tablet TAKE 1 TABLET BY MOUTH 6 TIMES DAILY 540 tablet 1  . cholecalciferol (VITAMIN D) 1000 UNITS tablet Take 5,000 Units by mouth daily.    . cyanocobalamin 500 MCG tablet Take 500 mcg by mouth daily.    [name] ibuprofen (ADVIL,MOTRIN) 200 MG tablet Take 200 mg by mouth every 6 (six) hours as needed. Reported on 12/24/2015    . Magnesium Oxide 500 MG TABS Take 1 tablet by mouth daily.    . polyethylene glycol (MIRALAX) packet Take 17 g by mouth daily.    . pramipexole (MIRAPEX) 1 MG tablet TAKE 1 TABLET BY MOUTH 3 TIMES DAILY 270 tablet 1  . ranitidine (ZANTAC) 150 MG tablet Take 150 mg by mouth daily as needed. For acid reflex     No current facility-administered medications on file  prior to visit.    Medical History:  Past Medical History  Diagnosis Date  . Parkinson's disease ([place]) 10-07-12  . 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 Never smoked tobacco   . Spinal stenosis   . Costochondritis   . Hyperlipidemia 10-07-12  . GERD (gastroesophageal reflux disease)   . Prediabetes   . Parkinson's disease ([place])   . Chest pain, unspecified   . History of hiatal hernia   . Headache     ocular migraines  . Arthritis   . Anemia     as young girl and teenager  . Constipation     Allergies:  Allergies  Allergen Reactions  . Macrobid [Nitrofurantoin Macrocrystal]  Swelling and Rash  . Baclofen Other (See Comments)    Kept her awake, affected memory  . Gabapentin Other (See Comments)    Weak feeling; made her feel " loopy".   . Cephalosporins Other (See Comments)    Cannot sleep  . Codeine Nausea Only    dizziness  . Darvocet [Propoxyphene N-Acetaminophen] Nausea And Vomiting  . Levaquin [Levofloxacin] Hives  . Penicillins Rash  . Selegiline Hcl Rash  . Tramadol Nausea And Vomiting  . Triamcinolone Itching     Review of Systems:  Review of Systems  Constitutional: Negative for fever, chills and malaise/fatigue.  HENT: Negative for congestion, ear pain and sore throat.   Eyes: Negative.   Respiratory: Negative for cough, shortness of breath and wheezing.   Cardiovascular: Negative for chest pain, palpitations and leg swelling.  Gastrointestinal: Positive for heartburn. Negative for abdominal pain, diarrhea, constipation, blood in stool and melena.  Genitourinary: Negative.   Musculoskeletal: Positive for back pain.  Neurological: Negative for dizziness, loss of consciousness and headaches.  Psychiatric/Behavioral: Negative for depression. The patient is not nervous/anxious and does not have insomnia.     Family history- Review and unchanged  Social history- Review and unchanged  Physical Exam: BP 116/64 mmHg  Pulse 78  Temp(Src) 98 F (36.7 C) (Temporal)  Resp 16  Ht 4' 11.25" (1.505 m)  Wt 142 lb (64.411 kg)  BMI 28.44 kg/m2 Wt Readings from Last 3 Encounters:  02/14/16 142 lb (64.411 kg)  12/25/15 160 lb 11.5 oz (72.9 kg)  12/16/15 147 lb 3.2 oz (66.769 kg)    General Appearance: Well nourished well developed, in no apparent distress. Eyes: PERRLA, EOMs, conjunctiva no swelling or erythema ENT/Mouth: Ear canals normal without obstruction, swelling, erythma, discharge.  TMs normal bilaterally.  Oropharynx moist, clear, without exudate, or postoropharyngeal swelling. Neck: Supple, thyroid normal,no cervical adenopathy   Respiratory: Respiratory effort normal, Breath sounds clear A&P without rhonchi, wheeze, or rale.  No retractions, no accessory usage. Cardio: RRR with no MRGs. Brisk peripheral pulses without edema.  Abdomen: Soft, + BS,  Non tender, no guarding, rebound, hernias, masses. Musculoskeletal: Full ROM, 5/5 strength, Normal gait Skin: Warm, dry without rashes, lesions, ecchymosis.  Neuro: Awake and oriented X 3, Cranial nerves intact. Normal muscle tone, no cerebellar symptoms. Psych: Normal affect, Insight and Judgment appropriate.    Starlyn Skeans, PA-C 10:36 AM [facility]

## 2016-02-17 ENCOUNTER — Ambulatory Visit (INDEPENDENT_AMBULATORY_CARE_PROVIDER_SITE_OTHER): Payer: Medicare Other | Admitting: Neurology

## 2016-02-17 ENCOUNTER — Encounter: Payer: Self-pay | Admitting: Neurology

## 2016-02-17 VITALS — BP 128/72 | HR 80 | Ht 60.0 in | Wt 144.0 lb

## 2016-02-17 DIAGNOSIS — G4752 REM sleep behavior disorder: Secondary | ICD-10-CM

## 2016-02-17 DIAGNOSIS — G20 Parkinson's disease: Secondary | ICD-10-CM | POA: Diagnosis not present

## 2016-02-17 DIAGNOSIS — H25813 Combined forms of age-related cataract, bilateral: Secondary | ICD-10-CM | POA: Diagnosis not present

## 2016-02-17 DIAGNOSIS — H40013 Open angle with borderline findings, low risk, bilateral: Secondary | ICD-10-CM | POA: Diagnosis not present

## 2016-02-17 DIAGNOSIS — H40019 Open angle with borderline findings, low risk, unspecified eye: Secondary | ICD-10-CM | POA: Diagnosis not present

## 2016-02-17 DIAGNOSIS — H5203 Hypermetropia, bilateral: Secondary | ICD-10-CM | POA: Diagnosis not present

## 2016-02-17 DIAGNOSIS — H52223 Regular astigmatism, bilateral: Secondary | ICD-10-CM | POA: Diagnosis not present

## 2016-02-17 NOTE — Progress Notes (Signed)
Alexandra Lindsey was seen today in the movement disorders clinic for neurologic consultation at the request of MCKEOWN,WILLIAM DAVID, MD.  The patient has previously seen both Dr. Erling Cruz and Dr. Rexene Alberts.  The records that were available to me were reviewed.  The consultation is for the evaluation and treatment of Parkinson's disease.  The patient has had a diagnosis of Parkinson's disease since at least 2002.  The first symptom was inability to get out of a chair and dragging the L leg and shuffling.  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    The patient was placed on Mirapex in 2002 and remains on that medication.  Once she was started on that medication she could immediately clog again.  She is currently on 1 mg 3 times a day.  She was placed on Stalevo 3 or 4 years later and is currently on Stalevo 100 mg, one tablet at 6 AM, half a tablet at 9 AM, 1 tablet at noon, half a tablet at 3 PM and one tablet at 6 PM.  In addition, she takes a regular carbidopa/levodopa 25/100 in the AM and chews it.  If she doesn't do that, she shuffles.  She chews that pill and then 45 mins later starts the Stalevo regimen.  She does state that Stalevo has become quite expensive for her and her insurance is going to change and asks about alternatives.    10/06/14 update:  The patient presents today, accompanied by her husband to supplement the history.  Last visit, I discontinued the patient's Stalevo, primarily because of cost and changed her to cupboard up a/levodopa 25/100, and we decided to hold the entacapone component.  She chews one tablet in the morning followed by carbidopa/levodopa 25/100 one tablet at 6 AM, half a tablet at 9 AM, 1 tablet at noon, half a tablet at 3 PM and one tablet at 6 PM.  She admits that she did not even take her 3 PM dose today, and is not sure that she even needs it.  Overall, from a Parkinson standpoint she feels great.  She admits to  some dyskinesia in the lower legs, but states that that is not bothersome.  Her biggest issue continues to be low back pain and left hip pain.  She has been back to Dr. Neomia Dear but did not get much relief.  She subsequently saw Dr. Lorin Mercy at [facility].  She was started on 100 mg of Neurontin at bedtime, but that only helped her sleep.  She states that first thing in the morning she has such pain in the left hip and back that she can barely walk.  01/07/15 update:  Pt f/u today re: PD.  She is on carbidopa/levodopa 25/100.  She chews one tablet in the morning followed by carbidopa/levodopa 25/100 one tablet at 6 AM, half a tablet at 9 AM, 1 tablet at noon, half a tablet at 3 PM and one tablet at 6 PM.  She is also on mirapex 1 mg tid.  She came off of meds today so that I could see what she looks like off.  She feels very stiff.  She feels that she isnt moving well.  She did have one fall since last visit.  She fell off of the stool at her vanity.   She didn't get hurt.   She saw Dr. Tamala Julian since last visit.  I  reviewed those records.  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  She is doing much better in that regard.  03/29/15 update:  Pt states that she is not doing as well as she was because of the back/leg pain again.  She continues to see Dr. Tamala Julian and has had further injections.  This one was in the bursa and she states that this one didn't help.  She is not walking as well.   She states that her PD meds are wearing off before they are due for the next dosing.  She takes one full tablet of levodopa at 5 AM (chewed), one tablet at 8 AM, half a tablet at 11 AM, 1 tablet at 1 PM, half a tablet at 4 PM and one tablet at 6 PM.  She remains on pramipexole 1.0 mg 3 times per day.  She just does not feel like she has the energy that she use to.  She has very little dyskinesia.  She has not had any falls.  05/25/15 update:  Pt is f/u today. I increased her  carbidopa/levodopa 25/100 to one tablet 6 times a day and she is on pramipexole 1.0 mg tid.  I referred her for neurorehab but she declined and she did not want therapy.  She has been to see Dr Tamala Julian and she had a SI joint injection.  She states that she uses a bar of soap in sheets for leg cramps and started putting that on her hip.  She now lathers soap on her hip and thinks that is why her hip is virtually pain free.  She also states that she was having weak spells and her gabapentin was d/c and she is doing better.  She is trying to exercise some at home (leg lifts, sit ups, dancing/clogging).  She is overall feeling great and better than she has is over a year.  She is very pleased.  10/07/15 update:  The patient is following up today regarding her Parkinson's disease.  She remains on pramipexole, 1.0 mg 3 times a day.  She is also on carbidopa/levodopa 1 tablet at 5 AM/8 AM/11 AM/1 PM/4 PM/6 PM, for a total of 600 mg of levodopa per day.  She has had some shuffling and the AM is the worst.  Also, whenever she gets up in the middle of the night to go to the bathroom, she can hardly get off of the bed and has trouble moving.  No falls since our last visit.  She has been trying to exercise/dance but that has been more troublesome lately.  No hallucinations but does have some "bad dreams."   I did review her records since our last visit.  She has seen Dr. Hulan Saas 2 times since our last visit regarding her hip and low back pain as well as SI joint dysfunction.  He ordered an EMG which was done by Dr. Posey Pronto on 09/21/2015.  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  She states that she is rubbing bar soap on it and she feels that this helps tremendously.  She did d/c baclofen because it was causing cognitive dulling.  She states that she d/c gabapentin as well as made her feel weak and without energy.    02/17/16 update:  The patient is following up today regarding her Parkinson's disease.  She remains on  pramipexole, 1.0 mg 3 times a day.  She is also on carbidopa/levodopa 1 tablet at 5 AM/8 AM/11 AM/1 PM/4  PM/6 PM, and I added carbidopa/levodopa 50/200 last visit.  She called me and stated that this made her worse, and I did not think that this made much sense, but held the medication and she still felt worse, so I asked her to follow-up with her primary care physician to make sure nothing else was going wrong.  She did follow-up with her primary care physician, but ultimately ended up staying off of the medication.  I did review her records since our last visit.  She underwent lumbar decompression surgery and was hospitalized from 12/20/2015 to 12/22/2015.  This was done by Dr. Sherwood Gambler.  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  She reports that she doesn't remember 13 days but she is much better.  She never experienced a set back with her Parkinsons disease.  She did fall during that 2nd hospital stay attempting to get up by herself and then she fell at the SNF during rehab but didn't get hurt.  After SNF rehab, she had [facility] at the home and they just stopped therapy a week and a half ago.    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 Macrobid [Nitrofurantoin Macrocrystal] Swelling and Rash  . Baclofen Other (See Comments)    Kept her awake, affected memory  . Gabapentin Other (See Comments)    Weak feeling; made her feel " loopy".   . Cephalosporins Other (See Comments)    Cannot sleep  . Codeine Nausea Only    dizziness  . Darvocet [Propoxyphene N-Acetaminophen] Nausea And  Vomiting  . Levaquin [Levofloxacin] Hives  . Penicillins Rash  . Selegiline Hcl Rash  . Tramadol Nausea And Vomiting  . Triamcinolone Itching    CURRENT MEDICATIONS:  Outpatient Encounter Prescriptions as of 02/17/2016  Medication Sig  . carbidopa-levodopa (SINEMET IR) 25-100 MG tablet TAKE 1 TABLET BY MOUTH 6 TIMES DAILY  . cholecalciferol (VITAMIN D) 1000 UNITS tablet Take 5,000 Units by mouth daily.  . cyanocobalamin 500 MCG tablet Take 500 mcg by mouth daily.  [name] ibuprofen (ADVIL,MOTRIN) 200 MG tablet Take 200 mg by mouth every 6 (six) hours as needed. Reported on 12/24/2015  . Magnesium Oxide 500 MG TABS Take 1 tablet by mouth daily.  . polyethylene glycol (MIRALAX) packet Take 17 g by mouth daily.  . pramipexole (MIRAPEX) 1 MG tablet TAKE 1 TABLET BY MOUTH 3 TIMES DAILY  . ranitidine (ZANTAC) 150 MG tablet Take 150 mg by mouth daily as needed. For acid reflex   No facility-administered encounter medications on file as of 02/17/2016.    PAST MEDICAL HISTORY:   Past Medical History  Diagnosis Date  . Parkinson's disease ([place]) 10-07-12  . 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 Never smoked tobacco   . Spinal stenosis   . Costochondritis   . Hyperlipidemia 10-07-12  . GERD (gastroesophageal reflux disease)   . Prediabetes   . Parkinson's disease ([place])   . Chest pain, unspecified   . History of hiatal hernia   . Headache     ocular migraines  . Arthritis   . Anemia  as young girl and teenager  . Constipation     PAST SURGICAL HISTORY:   Past Surgical History  Procedure Laterality Date  . Abdominal hysterectomy    . Hernia repair    . Rotator cuff repair Left 10-07-12  . Bunionectomy Bilateral 11-11-3  . Cardiac catheterization Bilateral 2013  . Left heart catheterization with coronary angiogram N/A 07/03/2012    Procedure: LEFT HEART CATHETERIZATION WITH CORONARY ANGIOGRAM;  Surgeon: Troy Sine, MD;  Location: [facility] CATH LAB;  Service: Cardiovascular;   Laterality: N/A;  . Appendectomy    . Colonoscopy    . Back surgery  12/20/2015  . Repair of cerebrospinal fluid leak N/A 12/24/2015    Procedure: LUMBAR WOUND EXPLORATION, REPAIR OF CEREBROSPINAL FLUID LEAK, PLACEMENT OF LUMBAR DRAIN;  Surgeon: Kevan Ny Ditty, MD;  Location: [facility];  Service: Neurosurgery;  Laterality: N/A;    SOCIAL HISTORY:   Social History   Social History  . Marital Status: Married    Spouse Name: N/A  . Number of Children: N/A  . Years of Education: N/A   Occupational History  . retired     Chiropractor   Social History Main Topics  . Smoking status: Never Smoker   . Smokeless tobacco: Never Used  . Alcohol Use: No  . Drug Use: No  . Sexual Activity: Not on file   Other Topics Concern  . Not on file   Social History Narrative    FAMILY HISTORY:   Family Status  Relation Status Death Age  . Mother Deceased     esophageal cancer, heart attack  . Father Deceased     suicide  . Brother Deceased     suicide  . Brother Alive     HTN, hypercholesterolemia  . Brother Alive     mental retardation    ROS:      A complete 10 system review of systems was obtained and was unremarkable apart from what is mentioned above.  PHYSICAL EXAMINATION:    VITALS:   Filed Vitals:   02/17/16 0959  BP: 128/72  Pulse: 80  Height: 5' (1.524 m)  Weight: 144 lb (65.318 kg)    GEN:  The patient appears stated age and is in NAD. HEENT:  Normocephalic, atraumatic.  The mucous membranes are moist. The superficial temporal arteries are without ropiness or tenderness. CV:  RRR Lungs:  CTAB Neck/HEME:  There are no carotid bruits bilaterally. Skin:  Back incision is healing and no erythema or drainage.  Neurological examination:  Orientation: The patient is alert and oriented x3. Fund of knowledge is appropriate.  Recent and remote memory are intact.  Attention and concentration are normal.    Able to name objects and repeat phrases. Cranial  nerves: There is good facial symmetry.  No significant facial hypomimia.  Pupils are equal round and reactive to light bilaterally. Fundoscopic exam reveals clear margins bilaterally. Extraocular muscles are intact.  There are no square wave jerks.  The visual fields are full to confrontational testing. The speech is fluent and clear. Soft palate rises symmetrically and there is no tongue deviation. Hearing is intact to conversational tone. Sensation: Sensation is intact to light touch throughout. Motor: Strength is 5/5 in the bilateral upper and lower extremities.   Shoulder shrug is equal and symmetric.  There is no pronator drift.  Movement examination: Tone: There is no rigidity in the UE/LE. Abnormal movements: there is some mild dyskinesia  Coordination:  There is no decremation  today with RAM's, with any form of RAMS, including alternating supination and pronation of the forearm, hand opening and closing, finger taps, heel taps and toe taps. Gait and Station: The patient has no significant difficulty arising out of a deep-seated chair without the use of the hands. The patient stride length is greatly improved.  She actually danced down the hall again today (last visit as well).  ASSESSMENT/PLAN:  1.  Idiopathic PD.    -She looks good today and will contine with carbidopa/levodopa 25/100 on the following schedule: 1 tablet at 5 AM, 1 tablet at 8 AM, 1 tablet at 11 AM, 1 tablet at 1 PM, 1 tablet at 4 PM and one tablet at 6 PM.  She does have some dyskinesia but it is not bothering her.  -Talked to her about risks of mirapex 1 mg tid as she ages but didn't change that today.  She has no SE with it. 2.  Back pain  -She is doing much better after back surgery.  Did have complication of cauda equina syndrome but much better now.  Wound is healing and looks good 3.  REM behavior disorder.  -  She did not want to take the clonazepam, primarily because of its addictive properties.  She did try it for  a short period of time.  She tried Seroquel as well, but it caused diplopia.  She does not want to try anything further right now. 4 .  Follow-up with me in 3-4 months, sooner should new issues arise.  Much greater than 50% of this visit was spent in counseling with the patient and the family.  Total face to face time:  35 min

## 2016-04-04 DIAGNOSIS — M5136 Other intervertebral disc degeneration, lumbar region: Secondary | ICD-10-CM | POA: Diagnosis not present

## 2016-04-04 DIAGNOSIS — M4726 Other spondylosis with radiculopathy, lumbar region: Secondary | ICD-10-CM | POA: Diagnosis not present

## 2016-04-04 DIAGNOSIS — Z981 Arthrodesis status: Secondary | ICD-10-CM | POA: Diagnosis not present

## 2016-04-13 ENCOUNTER — Other Ambulatory Visit: Payer: Self-pay | Admitting: Neurology

## 2016-04-13 NOTE — Telephone Encounter (Signed)
Carbidopa Levodopa and Mirapex refill requested. Per last office note- patient to remain on medication. Refill approved and sent to patient's pharmacy.   

## 2016-04-25 ENCOUNTER — Telehealth: Payer: Self-pay | Admitting: Neurology

## 2016-04-25 NOTE — Telephone Encounter (Signed)
Patient last had PT in February. She was made aware of recommendations and will let us know if this doesn't help.

## 2016-04-25 NOTE — Telephone Encounter (Signed)
Spoke with patient - she states that since her back surgeries in January she has good days and bad days. Over the last week every day has been bad. She states trouble walking. Feeling weak. Unable to go from sitting to standing without help. She just feels stiff and achy all over. She is currently taking Carbidopa Levodopa 25/100 IR - 1 tablet 7 times a day and Mirapex 1 tablet 3 times a day. She spoke with someone at her support group who is on Carbidopa Levodopa 50/200 at night and doing well. We had tried her on this a while back and she thought it made her worse. She tried this for the last two nights and she did better getting up for the bathroom in the middle of the night and could walk better in the morning. She feels that her medication is wearing off too soon. She has not had any recent UTI's or urinary symptoms and has had no other recent infections or illnesses. Please advise.

## 2016-04-25 NOTE — Telephone Encounter (Signed)
Have her take one extra levodopa in day as needed (can even split up 1/2 bid).  Continue the carbidopa/levodopa 50/200 at night.  Therapy?

## 2016-04-25 NOTE — Telephone Encounter (Signed)
Alexandra Lindsey [id number] she called this morning. She said that she has Parkinson's and that she has had an awful few days because of it. She would like to speak with you. Her number is [phone].  Thank you

## 2016-05-01 DIAGNOSIS — R29898 Other symptoms and signs involving the musculoskeletal system: Secondary | ICD-10-CM | POA: Diagnosis not present

## 2016-05-01 DIAGNOSIS — Z6828 Body mass index (BMI) 28.0-28.9, adult: Secondary | ICD-10-CM | POA: Diagnosis not present

## 2016-05-01 DIAGNOSIS — I10 Essential (primary) hypertension: Secondary | ICD-10-CM | POA: Diagnosis not present

## 2016-05-01 DIAGNOSIS — M5136 Other intervertebral disc degeneration, lumbar region: Secondary | ICD-10-CM | POA: Diagnosis not present

## 2016-05-01 DIAGNOSIS — M4726 Other spondylosis with radiculopathy, lumbar region: Secondary | ICD-10-CM | POA: Diagnosis not present

## 2016-05-01 DIAGNOSIS — M5416 Radiculopathy, lumbar region: Secondary | ICD-10-CM | POA: Diagnosis not present

## 2016-05-05 DIAGNOSIS — R29898 Other symptoms and signs involving the musculoskeletal system: Secondary | ICD-10-CM | POA: Diagnosis not present

## 2016-05-05 DIAGNOSIS — M4806 Spinal stenosis, lumbar region: Secondary | ICD-10-CM | POA: Diagnosis not present

## 2016-05-10 ENCOUNTER — Telehealth: Payer: Self-pay | Admitting: Neurology

## 2016-05-10 NOTE — Telephone Encounter (Signed)
Patient made aware - she signed up for water aerobics on Monday and wants to complete this first before starting PT. She will let us know if she would like [place] to order.

## 2016-05-10 NOTE — Telephone Encounter (Signed)
Spoke with patient - she is still having issues with weakness and walking. After her last call she did not continue Carbidopa Levodopa 50/200 at night after instructed because she said she tried it one more night and it didn't help that time. She did try it last night and it helped, but she felt good this morning so she didn't take her Levodopa at 5:00 am and 6:00 am as she usually does but waited until 7:15 am and is now stiff and feels bad. I explained that she should stay on a medication regimen even if she has a bad day. Advised she should still take 50/200 CR Levodopa at night and if she feels good in the morning she could not take 5 am and 6 am dose and instead combine it to one tablet at 5:30 am. She can not yet do therapy because her last PT was February and it hasn't been 6 months. She did recently have an MR of her back and is seeing Dr. Rita Ohara on Friday. Please advise if any other advise for patient.

## 2016-05-10 NOTE — Telephone Encounter (Signed)
Needs to talk to [facility] about medication.  Thinks she is taking too much. Hasn't been walking good the last month.

## 2016-05-10 NOTE — Telephone Encounter (Signed)
Agree but if she has decompensated since last therapy, insurance should pay for it early

## 2016-05-11 DIAGNOSIS — M4726 Other spondylosis with radiculopathy, lumbar region: Secondary | ICD-10-CM | POA: Diagnosis not present

## 2016-05-11 DIAGNOSIS — M5136 Other intervertebral disc degeneration, lumbar region: Secondary | ICD-10-CM | POA: Diagnosis not present

## 2016-05-11 DIAGNOSIS — Z6828 Body mass index (BMI) 28.0-28.9, adult: Secondary | ICD-10-CM | POA: Diagnosis not present

## 2016-05-11 DIAGNOSIS — R030 Elevated blood-pressure reading, without diagnosis of hypertension: Secondary | ICD-10-CM | POA: Diagnosis not present

## 2016-05-11 DIAGNOSIS — Z981 Arthrodesis status: Secondary | ICD-10-CM | POA: Diagnosis not present

## 2016-05-16 ENCOUNTER — Ambulatory Visit (INDEPENDENT_AMBULATORY_CARE_PROVIDER_SITE_OTHER): Payer: Medicare Other | Admitting: Neurology

## 2016-05-16 ENCOUNTER — Other Ambulatory Visit: Payer: Medicare Other

## 2016-05-16 ENCOUNTER — Encounter: Payer: Self-pay | Admitting: Neurology

## 2016-05-16 VITALS — BP 120/80 | HR 63 | Ht 60.0 in | Wt 146.0 lb

## 2016-05-16 DIAGNOSIS — Z5181 Encounter for therapeutic drug level monitoring: Secondary | ICD-10-CM | POA: Diagnosis not present

## 2016-05-16 DIAGNOSIS — R350 Frequency of micturition: Secondary | ICD-10-CM | POA: Diagnosis not present

## 2016-05-16 DIAGNOSIS — G20 Parkinson's disease: Secondary | ICD-10-CM

## 2016-05-16 LAB — COMPREHENSIVE METABOLIC PANEL
ALBUMIN: 3.9 g/dL (ref 3.6–5.1)
ALT: 5 U/L — ABNORMAL LOW (ref 6–29)
AST: 13 U/L (ref 10–35)
Alkaline Phosphatase: 91 U/L (ref 33–130)
BILIRUBIN TOTAL: 0.3 mg/dL (ref 0.2–1.2)
BUN: 25 mg/dL (ref 7–25)
CALCIUM: 9 mg/dL (ref 8.6–10.4)
CHLORIDE: 107 mmol/L (ref 98–110)
CO2: 25 mmol/L (ref 20–31)
CREATININE: 1.01 mg/dL — AB (ref 0.60–0.93)
Glucose, Bld: 93 mg/dL (ref 65–99)
Potassium: 4 mmol/L (ref 3.5–5.3)
SODIUM: 139 mmol/L (ref 135–146)
TOTAL PROTEIN: 5.9 g/dL — AB (ref 6.1–8.1)

## 2016-05-16 NOTE — Progress Notes (Signed)
Alexandra Lindsey was seen today in the movement disorders clinic for neurologic consultation at the request of MCKEOWN,WILLIAM DAVID, MD.  The patient has previously seen both Dr. Erling Cruz and Dr. Rexene Alberts.  The records that were available to me were reviewed.  The consultation is for the evaluation and treatment of Parkinson's disease.  The patient has had a diagnosis of Parkinson's disease since at least 2002.  The first symptom was inability to get out of a chair and dragging the L leg and shuffling.  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    The patient was placed on Mirapex in 2002 and remains on that medication.  Once she was started on that medication she could immediately clog again.  She is currently on 1 mg 3 times a day.  She was placed on Stalevo 3 or 4 years later and is currently on Stalevo 100 mg, one tablet at 6 AM, half a tablet at 9 AM, 1 tablet at noon, half a tablet at 3 PM and one tablet at 6 PM.  In addition, she takes a regular carbidopa/levodopa 25/100 in the AM and chews it.  If she doesn't do that, she shuffles.  She chews that pill and then 45 mins later starts the Stalevo regimen.  She does state that Stalevo has become quite expensive for her and her insurance is going to change and asks about alternatives.    10/06/14 update:  The patient presents today, accompanied by her husband to supplement the history.  Last visit, I discontinued the patient's Stalevo, primarily because of cost and changed her to cupboard up a/levodopa 25/100, and we decided to hold the entacapone component.  She chews one tablet in the morning followed by carbidopa/levodopa 25/100 one tablet at 6 AM, half a tablet at 9 AM, 1 tablet at noon, half a tablet at 3 PM and one tablet at 6 PM.  She admits that she did not even take her 3 PM dose today, and is not sure that she even needs it.  Overall, from a Parkinson standpoint she feels great.  She admits to  some dyskinesia in the lower legs, but states that that is not bothersome.  Her biggest issue continues to be low back pain and left hip pain.  She has been back to Dr. Neomia Dear but did not get much relief.  She subsequently saw Dr. Lorin Mercy at [facility].  She was started on 100 mg of Neurontin at bedtime, but that only helped her sleep.  She states that first thing in the morning she has such pain in the left hip and back that she can barely walk.  01/07/15 update:  Pt f/u today re: PD.  She is on carbidopa/levodopa 25/100.  She chews one tablet in the morning followed by carbidopa/levodopa 25/100 one tablet at 6 AM, half a tablet at 9 AM, 1 tablet at noon, half a tablet at 3 PM and one tablet at 6 PM.  She is also on mirapex 1 mg tid.  She came off of meds today so that I could see what she looks like off.  She feels very stiff.  She feels that she isnt moving well.  She did have one fall since last visit.  She fell off of the stool at her vanity.   She didn't get hurt.   She saw Dr. Tamala Julian since last visit.  I  reviewed those records.  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  She is doing much better in that regard.  03/29/15 update:  Pt states that she is not doing as well as she was because of the back/leg pain again.  She continues to see Dr. Tamala Julian and has had further injections.  This one was in the bursa and she states that this one didn't help.  She is not walking as well.   She states that her PD meds are wearing off before they are due for the next dosing.  She takes one full tablet of levodopa at 5 AM (chewed), one tablet at 8 AM, half a tablet at 11 AM, 1 tablet at 1 PM, half a tablet at 4 PM and one tablet at 6 PM.  She remains on pramipexole 1.0 mg 3 times per day.  She just does not feel like she has the energy that she use to.  She has very little dyskinesia.  She has not had any falls.  05/25/15 update:  Pt is f/u today. I increased her  carbidopa/levodopa 25/100 to one tablet 6 times a day and she is on pramipexole 1.0 mg tid.  I referred her for neurorehab but she declined and she did not want therapy.  She has been to see Dr Tamala Julian and she had a SI joint injection.  She states that she uses a bar of soap in sheets for leg cramps and started putting that on her hip.  She now lathers soap on her hip and thinks that is why her hip is virtually pain free.  She also states that she was having weak spells and her gabapentin was d/c and she is doing better.  She is trying to exercise some at home (leg lifts, sit ups, dancing/clogging).  She is overall feeling great and better than she has is over a year.  She is very pleased.  10/07/15 update:  The patient is following up today regarding her Parkinson's disease.  She remains on pramipexole, 1.0 mg 3 times a day.  She is also on carbidopa/levodopa 1 tablet at 5 AM/8 AM/11 AM/1 PM/4 PM/6 PM, for a total of 600 mg of levodopa per day.  She has had some shuffling and the AM is the worst.  Also, whenever she gets up in the middle of the night to go to the bathroom, she can hardly get off of the bed and has trouble moving.  No falls since our last visit.  She has been trying to exercise/dance but that has been more troublesome lately.  No hallucinations but does have some "bad dreams."   I did review her records since our last visit.  She has seen Dr. Hulan Saas 2 times since our last visit regarding her hip and low back pain as well as SI joint dysfunction.  He ordered an EMG which was done by Dr. Posey Pronto on 09/21/2015.  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  She states that she is rubbing bar soap on it and she feels that this helps tremendously.  She did d/c baclofen because it was causing cognitive dulling.  She states that she d/c gabapentin as well as made her feel weak and without energy.    02/17/16 update:  The patient is following up today regarding her Parkinson's disease.  She remains on  pramipexole, 1.0 mg 3 times a day.  She is also on carbidopa/levodopa 1 tablet at 5 AM/8 AM/11 AM/1 PM/4  PM/6 PM, and I added carbidopa/levodopa 50/200 last visit.  She called me and stated that this made her worse, and I did not think that this made much sense, but held the medication and she still felt worse, so I asked her to follow-up with her primary care physician to make sure nothing else was going wrong.  She did follow-up with her primary care physician, but ultimately ended up staying off of the medication.  I did review her records since our last visit.  She underwent lumbar decompression surgery and was hospitalized from 12/20/2015 to 12/22/2015.  This was done by Dr. Sherwood Gambler.  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  She reports that she doesn't remember 13 days but she is much better.  She never experienced a set back with her Parkinsons disease.  She did fall during that 2nd hospital stay attempting to get up by herself and then she fell at the SNF during rehab but didn't get hurt.  After SNF rehab, she had [facility] at the home and they just stopped therapy a week and a half ago.    05/16/16 update:  The patient is following up today regarding her Parkinson's disease.  She remains on pramipexole, 1.0 mg 3 times a day.  She was on carbidopa/levodopa 1 tablet at 5 AM/8 AM/11 AM/1 PM/4 PM/6 PM but since last visit she changed the timing of the dosage (5am/6am/11am/12pm/4pm/6pm).  She had restarted her carbidopa/levodopa 50/200 at night, but was only on it for about 3 days and then she discontinued it.  She then called back and stated that she was worse, but also admits that she has held her morning 2 dosages of levodopa.  She stayed off of the night levodopa as she said that it made worse, even though she had called [place] after that was d/c  to say she was worse.  I told her she needed to stay on a regular schedule and recommended physical therapy.  She wanted to try water aerobics instead of formal physical therapy.   She states that she is better today but overall feels worse over the last month or so.  She is stiff and having trouble moving.  Mornings are definitely the biggest problem.  Can have issue in the afternoon but that timing/frequency isn't consistent like the AM.  Uses walker when goes out.  Saw Dr. Rita Ohara last week and told that back issues were not cause of her problem, although when she bends over or tries to get up, she still has pain.    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 Macrobid [Nitrofurantoin Macrocrystal] Swelling and Rash  . Baclofen Other (See Comments)    Kept her awake, affected memory  . Gabapentin Other (See Comments)    Weak feeling; made her feel " loopy".   . Cephalosporins Other (See Comments)    Cannot sleep  . Codeine Nausea Only    dizziness  . Darvocet [Propoxyphene N-Acetaminophen] Nausea And Vomiting  . Levaquin [Levofloxacin] Hives  . Penicillins Rash  . Selegiline Hcl Rash  . Tramadol Nausea And Vomiting  .  Triamcinolone Itching    CURRENT MEDICATIONS:  Outpatient Encounter Prescriptions as of 05/16/2016  Medication Sig  . carbidopa-levodopa (SINEMET IR) 25-100 MG tablet TAKE 1 TABLET BY MOUTH 6 TIMES DAILY  . cholecalciferol (VITAMIN D) 1000 UNITS tablet Take 5,000 Units by mouth daily.  [name] ibuprofen (ADVIL,MOTRIN) 200 MG tablet Take 200 mg by mouth every 6 (six) hours as needed. Reported on 12/24/2015  . Magnesium Oxide 500 MG TABS Take 1 tablet by mouth daily.  . polyethylene glycol (MIRALAX) packet Take 17 g by mouth  daily.  . pramipexole (MIRAPEX) 1 MG tablet TAKE 1 TABLET BY MOUTH 3 TIMES DAILY  . ranitidine (ZANTAC) 150 MG tablet Take 150 mg by mouth daily as needed. For acid reflex  . [DISCONTINUED] cyanocobalamin 500 MCG tablet Take 500 mcg by mouth daily.   No facility-administered encounter medications on file as of 05/16/2016.    PAST MEDICAL HISTORY:   Past Medical History  Diagnosis Date  . Parkinson's disease ([place]) 10-07-12  . 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 Never smoked tobacco   . Spinal stenosis   . Costochondritis   . Hyperlipidemia 10-07-12  . GERD (gastroesophageal reflux disease)   . Prediabetes   . Parkinson's disease ([place])   . Chest pain, unspecified   . History of hiatal hernia   . Headache     ocular migraines  . Arthritis   . Anemia     as young girl and teenager  . Constipation     PAST SURGICAL HISTORY:   Past Surgical History  Procedure Laterality Date  . Abdominal hysterectomy    . Hernia repair    . Rotator cuff repair Left 10-07-12  . Bunionectomy Bilateral 11-11-3  . Cardiac catheterization Bilateral 2013  . Left heart catheterization with coronary angiogram N/A 07/03/2012    Procedure: LEFT HEART CATHETERIZATION WITH CORONARY ANGIOGRAM;  Surgeon: Troy Sine, MD;  Location: [facility] CATH LAB;  Service: Cardiovascular;  Laterality: N/A;  . Appendectomy    . Colonoscopy    . Back surgery  12/20/2015  . Repair of cerebrospinal fluid leak N/A 12/24/2015    Procedure: LUMBAR WOUND EXPLORATION, REPAIR OF CEREBROSPINAL FLUID LEAK, PLACEMENT OF LUMBAR DRAIN;  Surgeon: Kevan Ny Ditty, MD;  Location: [facility];  Service: Neurosurgery;  Laterality: N/A;    SOCIAL HISTORY:   Social History   Social History  . Marital Status: Married    Spouse Name: N/A  . Number of Children: N/A  . Years of Education: N/A   Occupational History  . retired     Chiropractor   Social History Main Topics  . Smoking status: Never Smoker   . Smokeless  tobacco: Never Used  . Alcohol Use: No  . Drug Use: No  . Sexual Activity: Not on file   Other Topics Concern  . Not on file   Social History Narrative    FAMILY HISTORY:   Family Status  Relation Status Death Age  . Mother Deceased     esophageal cancer, heart attack  . Father Deceased     suicide  . Brother Deceased     suicide  . Brother Alive     HTN, hypercholesterolemia  . Brother Alive     mental retardation    ROS:      A complete 10 system review of systems was obtained and was unremarkable apart from what is mentioned above.  PHYSICAL EXAMINATION:    VITALS:   Filed Vitals:  05/16/16 1236  BP: 120/80  Pulse: 63  Height: 5' (1.524 m)  Weight: 146 lb (66.225 kg)    GEN:  The patient appears stated age and is in NAD. HEENT:  Normocephalic, atraumatic.  The mucous membranes are moist. The superficial temporal arteries are without ropiness or tenderness. CV:  RRR Lungs:  CTAB Neck/HEME:  There are no carotid bruits bilaterally. Skin:  Back incision is healing and no erythema or drainage.  Neurological examination:  Orientation: The patient is alert and oriented x3.  Cranial nerves: There is good facial symmetry.  No significant facial hypomimia.   Extraocular muscles are intact.  There are no square wave jerks.  The visual fields are full to confrontational testing. The speech is fluent and clear. Soft palate rises symmetrically and there is no tongue deviation. Hearing is intact to conversational tone. Sensation: Sensation is intact to light touch throughout. Motor: Strength is 5/5 in the bilateral upper and lower extremities.   Shoulder shrug is equal and symmetric.  There is no pronator drift.  Movement examination: Tone: There is only a hint of rigidity in the LUE Abnormal movements: there is some mild dyskinesia  Coordination:  There is no decremation today with RAM's, with any form of RAMS, including alternating supination and pronation of the  forearm, hand opening and closing, finger taps, heel taps and toe taps. Gait and Station: The patient has difficulty arising out of a deep-seated chair without the use of the hands and makes several attempts and then is able to do this. The patient stride length is short and shuffling.  ASSESSMENT/PLAN:  1.  Idiopathic PD.    -She looks worse today and she thinks that it was a precipitous decline.  Will check cmp and UA today  -Talked to her about risks of mirapex 1 mg tid as she ages but didn't change that today.  She has no SE with it.  -think she would be great apokyn candidate but she refused this.  If nasal form of levodopa and SL apomorphine comes to the market, this would be an option.    -could be DBS candidate but she has refused  -asked me about neuroimaging.  Told her that I really see nothing focal/lateralizing on her exam and sx's are suggestive of PD progression.  If above doesn't help, told her that I would be agreeable to order 2.  Back pain  -She is doing somewhat better but reports that she is still having back pain.  Saw Dr. Rita Ohara and didn't think issues were surgical. 3.  REM behavior disorder.  -  She did not want to take the clonazepam, primarily because of its addictive properties.  She did try it for a short period of time.  She tried Seroquel as well, but it caused diplopia.  She does not want to try anything further right now. 4 .  Follow-up with me in 3-4 months, sooner should new issues arise.  Will be in contact with her re: blood work and if not clinically better, she will let me know.

## 2016-05-16 NOTE — Patient Instructions (Signed)
1. Your provider has requested that you have labwork completed today. Please go to [facility] ([id number]) on the second floor of this building before leaving the office today. You do not need to check in. If you are not called within 15 minutes please check with the front desk.  2. Change Carbidopa Levodopa as follows: 3 tablets at 5:00 am, 2 tablets at 9:00 am, 1 tablet at 1:00 pm, 2 tablets at 5:00 pm

## 2016-05-17 ENCOUNTER — Telehealth: Payer: Self-pay | Admitting: Neurology

## 2016-05-17 LAB — URINALYSIS
Bilirubin Urine: NEGATIVE
Glucose, UA: NEGATIVE
HGB URINE DIPSTICK: NEGATIVE
KETONES UR: NEGATIVE
Leukocytes, UA: NEGATIVE
NITRITE: NEGATIVE
Protein, ur: NEGATIVE
SPECIFIC GRAVITY, URINE: 1.018 (ref 1.001–1.035)
pH: 5.5 (ref 5.0–8.0)

## 2016-05-17 NOTE — Telephone Encounter (Signed)
-----   Message from [place], DO sent at 05/17/2016  7:29 AM EDT ----- Let pt know that urine looks okay.  Could be little dehydrated based on kidney function.  Make sure that she is drinking plenty of water

## 2016-05-17 NOTE — Telephone Encounter (Signed)
Left message on machine for patient to call back.

## 2016-05-17 NOTE — Telephone Encounter (Signed)
[facility] left message that they were returning your call/Dawn CB# [phone]

## 2016-05-17 NOTE — Telephone Encounter (Signed)
Patient made aware. She is feeling better today with medication time changes. She will let us know if anything changes.

## 2016-05-19 LAB — URINE CULTURE: Colony Count: 50000

## 2016-05-23 ENCOUNTER — Encounter: Payer: Self-pay | Admitting: Internal Medicine

## 2016-05-23 ENCOUNTER — Ambulatory Visit (INDEPENDENT_AMBULATORY_CARE_PROVIDER_SITE_OTHER): Payer: Medicare Other | Admitting: Internal Medicine

## 2016-05-23 VITALS — BP 112/66 | HR 76 | Temp 97.4°F | Resp 16 | Ht 60.5 in | Wt 146.6 lb

## 2016-05-23 DIAGNOSIS — E785 Hyperlipidemia, unspecified: Secondary | ICD-10-CM

## 2016-05-23 DIAGNOSIS — G20 Parkinson's disease: Secondary | ICD-10-CM

## 2016-05-23 DIAGNOSIS — K219 Gastro-esophageal reflux disease without esophagitis: Secondary | ICD-10-CM | POA: Diagnosis not present

## 2016-05-23 DIAGNOSIS — I10 Essential (primary) hypertension: Secondary | ICD-10-CM

## 2016-05-23 DIAGNOSIS — E559 Vitamin D deficiency, unspecified: Secondary | ICD-10-CM

## 2016-05-23 DIAGNOSIS — R7303 Prediabetes: Secondary | ICD-10-CM

## 2016-05-23 DIAGNOSIS — Z79899 Other long term (current) drug therapy: Secondary | ICD-10-CM | POA: Diagnosis not present

## 2016-05-23 LAB — CBC WITH DIFFERENTIAL/PLATELET
BASOS PCT: 1 %
Basophils Absolute: 55 cells/uL (ref 0–200)
EOS ABS: 165 {cells}/uL (ref 15–500)
EOS PCT: 3 %
HCT: 35.6 % (ref 35.0–45.0)
Hemoglobin: 11.7 g/dL (ref 11.7–15.5)
LYMPHS ABS: 1705 {cells}/uL (ref 850–3900)
Lymphocytes Relative: 31 %
MCH: 29.3 pg (ref 27.0–33.0)
MCHC: 32.9 g/dL (ref 32.0–36.0)
MCV: 89.2 fL (ref 80.0–100.0)
MONOS PCT: 9 %
MPV: 10.5 fL (ref 7.5–12.5)
Monocytes Absolute: 495 cells/uL (ref 200–950)
NEUTROS ABS: 3080 {cells}/uL (ref 1500–7800)
Neutrophils Relative %: 56 %
PLATELETS: 266 10*3/uL (ref 140–400)
RBC: 3.99 MIL/uL (ref 3.80–5.10)
RDW: 13.8 % (ref 11.0–15.0)
WBC: 5.5 10*3/uL (ref 3.8–10.8)

## 2016-05-23 LAB — HEMOGLOBIN A1C
HEMOGLOBIN A1C: 5.8 % — AB (ref <5.7)
MEAN PLASMA GLUCOSE: 120 mg/dL

## 2016-05-23 LAB — TSH: TSH: 1.47 m[IU]/L

## 2016-05-23 NOTE — Patient Instructions (Signed)

## 2016-05-23 NOTE — Progress Notes (Signed)
Patient ID: Alexandra Lindsey, female   DOB: 09-30-1941, 75 y.o.   MRN: [id number]  [facility] ADULT & [facility]OLESC[facility] INTERNAL MEDICINE                       Unk Pinto, M.D.        Uvaldo Bristle. Silverio Lay, P.A.-C       Starlyn Skeans, P.A.-C   [facility]                [address] [place] [id number] Telephone [phone] Telefax [phone] ______________________________________________________________________     This very nice 75 y.o. MWF presents for 6 month follow up with Hypertension, Hyperlipidemia, Pre-Diabetes and Vitamin D Deficiency. Further note that patient had Cx disc surgery  in Jan and has done surprising well.      Patient is monitored expectantly  for labile HTN & BP has been controlled and today's BP is  112/66 mmHg. Patient had negative/normal Cardiolite scans in 2007 and 2013 with an EF 78%.  Patient has had no complaints of any cardiac type chest pain, palpitations, dyspnea/orthopnea/PND, dizziness, claudication, or dependent edema. Patient also has a significant Dx/o Parkinson's since 2002 and has done surprising well and currently is followed by Dr [name].      Hyperlipidemia is near controlled with diet & meds. Patient denies myalgias or other med SE's. Last Lipids were Cholesterol 182; HDL 54; LDL Cholesterol 107; Triglycerides 106 on 02/14/2016.     Also, the patient has history of PreDiabetes with A1c 6.3% in 2011 and then 5.9% in 2013 and has had no symptoms of reactive hypoglycemia, diabetic polys, paresthesias or visual blurring.  Last A1c was  5.8% on 02/14/2016.     Further, the patient also has history of Vitamin D Deficiency  Of "23" in 2008 and supplements vitamin D without any suspected side-effects. Last vitamin D was  42 on 11/10/2015.    Medication Sig  . SINEMET IR 25-100 MG  TAKE 1 TABLET BY MOUTH 6 TIMES DAILY (Patient taking differently: TAKE 1 TABLET BY MOUTH 8 TIMES DAILY)  . VITAMIN D 1000 UNITS  Take 5,000 Units by mouth daily.  . Ibuprofen 200 MG tablet Take 200 mg by mouth every 6 (six) hours as needed. Reported on 12/24/2015  . Magnesium  500 MG  Take 1 tablet by mouth daily.  [name] MIRALAX pak Take 17 g by mouth daily.  [name] MIRAPEX 1 MG tablet TAKE 1 TABLET BY MOUTH 3 TIMES DAILY  . ranitidine  150 MG tablet Take 150 mg by mouth daily as needed. For acid reflex   Allergies  Allergen Reactions  . Macrobid [Nitrofurantoin Macrocrystal] Swelling and Rash  . Baclofen Other (See Comments)    Kept her awake, affected memory  . Gabapentin Other (See Comments)    Weak feeling; made her feel " loopy".   . Cephalosporins Other (See Comments)    Cannot sleep  . Codeine Nausea Only    dizziness  . Darvocet [Propoxyphene N-Acetaminophen] Nausea And Vomiting  . Levaquin [Levofloxacin] Hives  . Penicillins Rash  . Selegiline Hcl Rash  . Tramadol Nausea And Vomiting  . Triamcinolone Itching   PMHx:   Past Medical History  Diagnosis Date  . Parkinson's disease ([place]) 10-07-12  . 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 Never smoked tobacco   . Spinal stenosis   .  Costochondritis   . Hyperlipidemia 10-07-12  . GERD (gastroesophageal reflux disease)   . Prediabetes   . Parkinson's disease ([place])   . Chest pain, unspecified   . History of hiatal hernia   . Headache     ocular migraines  . Arthritis   . Anemia     as young girl and teenager  . Constipation    Immunization History  Administered Date(s) Administered  . Influenza, High Dose Seasonal PF 09/08/2014, 10/04/2015  . Pneumococcal Conjugate-13 11/10/2015  . Pneumococcal-Unspecified 11/27/2006  . Td 09/08/2013  . Zoster 06/27/2006   Past Surgical History  Procedure Laterality Date  . Abdominal hysterectomy    . Hernia repair    . Rotator cuff repair Left 10-07-12  . Bunionectomy Bilateral 11-11-3  . Cardiac catheterization Bilateral 2013  . Left heart catheterization with coronary angiogram N/A 07/03/2012     Procedure: LEFT HEART CATHETERIZATION WITH CORONARY ANGIOGRAM;  Troy Sine, MD  . Appendectomy    . Colonoscopy    . Back surgery  12/20/2015  . Repair of cerebrospinal fluid leak N/A 12/24/2015    LUMBAR WOUND EXPLORATION, RPR OF CSF FLUID LEAK, PLACEMENT OF LUMBAR DRAIN;  Surgeon: Kevan Ny Ditty, MD   FHx:    Reviewed / unchanged  SHx:    Reviewed / unchanged  Systems Review:  Constitutional: Denies fever, chills, wt changes, headaches, insomnia, fatigue, night sweats, change in appetite. Eyes: Denies redness, blurred vision, diplopia, discharge, itchy, watery eyes.  ENT: Denies discharge, congestion, post nasal drip, epistaxis, sore throat, earache, hearing loss, dental pain, tinnitus, vertigo, sinus pain, snoring.  CV: Denies chest pain, palpitations, irregular heartbeat, syncope, dyspnea, diaphoresis, orthopnea, PND, claudication or edema. Respiratory: denies cough, dyspnea, DOE, pleurisy, hoarseness, laryngitis, wheezing.  Gastrointestinal: Denies dysphagia, odynophagia, heartburn, reflux, water brash, abdominal pain or cramps, nausea, vomiting, bloating, diarrhea, constipation, hematemesis, melena, hematochezia  or hemorrhoids. Genitourinary: Denies dysuria, frequency, urgency, nocturia, hesitancy, discharge, hematuria or flank pain. Musculoskeletal: Denies arthralgias, myalgias, stiffness, jt. swelling, pain, limping or strain/sprain.  Skin: Denies pruritus, rash, hives, warts, acne, eczema or change in skin lesion(s). Neuro: No weakness, tremor, incoordination, spasms, paresthesia or pain. Psychiatric: Denies confusion, memory loss or sensory loss. Endo: Denies change in weight, skin or hair change.  Heme/Lymph: No excessive bleeding, bruising or enlarged lymph nodes.  Physical Exam  BP 112/66   Pulse 76  Temp  97.4 F   Resp 16  Ht 5' 0.5"   Wt 146 lb 9.6 oz     BMI 28.15  Appears well nourished and in no distress. Eyes: PERRLA, EOMs, conjunctiva no swelling or  erythema. Sinuses: No frontal/maxillary tenderness ENT/Mouth: EAC's clear, TM's nl w/o erythema, bulging. Nares clear w/o erythema, swelling, exudates. Oropharynx clear without erythema or exudates. Oral hygiene is good. Tongue normal, non obstructing. Hearing intact.  Neck: Supple. Thyroid nl. Car 2+/2+ without bruits, nodes or JVD. Chest: Respirations nl with BS clear & equal w/o rales, rhonchi, wheezing or stridor.  Cor: Heart sounds normal w/ regular rate and rhythm without sig. murmurs, gallops, clicks, or rubs. Peripheral pulses normal and equal  without edema.  Abdomen: Soft & bowel sounds normal. Non-tender w/o guarding, rebound, hernias, masses, or organomegaly.  Lymphatics: Unremarkable.  Musculoskeletal: Full ROM all peripheral extremities, joint stability, 5/5 strength, and normal gait. No cogwheeling .  Skin: Warm, dry without exposed rashes, lesions or ecchymosis apparent.  Neuro: Cranial nerves intact, reflexes equal bilaterally. Sensory-motor testing grossly intact. Tendon reflexes grossly intact. No tremor.  Pysch: Alert & oriented x 3.  Insight and judgement nl & appropriate. No ideations.  Assessment and Plan:  1. Essential hypertension  - Continue monitor blood pressure at home. Continue diet/meds same. - TSH  2. Hyperlipidemia  - Continue diet/meds, exercise,& lifestyle modifications. Continue monitor periodic cholesterol/liver & renal functions  - Lipid panel - TSH  3. Prediabetes  - Continue diet, exercise, lifestyle modifications. Monitor appropriate labs. - Hemoglobin A1c - Insulin, random  4. Vitamin D deficiency  - Continue supplementation. - VITAMIN D 25 Hydroxy   5. Parkinson's disease ([place])   6. Gastroesophageal reflux disease   7. Medication management  - CBC with Differential/Platelet - Magnesium   Recommended regular exercise, BP monitoring, weight control, and discussed med and SE's. Recommended labs to assess and monitor clinical  status. Further disposition pending results of labs. Over 30 minutes of exam, counseling, chart review was performed

## 2016-05-24 LAB — LIPID PANEL
Cholesterol: 185 mg/dL (ref 125–200)
HDL: 51 mg/dL (ref >=46)
LDL CALC: 109 mg/dL (ref <130)
TRIGLYCERIDES: 124 mg/dL (ref <150)
Total CHOL/HDL Ratio: 3.6 Ratio (ref <=5.0)
VLDL: 25 mg/dL (ref <30)

## 2016-05-24 LAB — MAGNESIUM: MAGNESIUM: 2.1 mg/dL (ref 1.5–2.5)

## 2016-05-24 LAB — INSULIN, RANDOM: INSULIN: 7.1 u[IU]/mL (ref 2.0–19.6)

## 2016-05-24 LAB — VITAMIN D 25 HYDROXY (VIT D DEFICIENCY, FRACTURES): Vit D, 25-Hydroxy: 59 ng/mL (ref 30–100)

## 2016-06-20 ENCOUNTER — Ambulatory Visit: Payer: Medicare Other | Admitting: Neurology

## 2016-07-04 DIAGNOSIS — Z981 Arthrodesis status: Secondary | ICD-10-CM | POA: Diagnosis not present

## 2016-07-04 DIAGNOSIS — M4726 Other spondylosis with radiculopathy, lumbar region: Secondary | ICD-10-CM | POA: Diagnosis not present

## 2016-07-04 DIAGNOSIS — Z6828 Body mass index (BMI) 28.0-28.9, adult: Secondary | ICD-10-CM | POA: Diagnosis not present

## 2016-07-04 DIAGNOSIS — M5136 Other intervertebral disc degeneration, lumbar region: Secondary | ICD-10-CM | POA: Diagnosis not present

## 2016-08-11 DIAGNOSIS — N3090 Cystitis, unspecified without hematuria: Secondary | ICD-10-CM | POA: Diagnosis not present

## 2016-08-11 DIAGNOSIS — N390 Urinary tract infection, site not specified: Secondary | ICD-10-CM | POA: Diagnosis not present

## 2016-08-15 NOTE — Progress Notes (Signed)
Alexandra Lindsey was seen today in the movement disorders clinic for neurologic consultation at the request of MCKEOWN,WILLIAM DAVID, MD.  The patient has previously seen both Dr. Erling Cruz and Dr. Rexene Alberts.  The records that were available to me were reviewed.  The consultation is for the evaluation and treatment of Parkinson's disease.  The patient has had a diagnosis of Parkinson's disease since at least 2002.  The first symptom was inability to get out of a chair and dragging the L leg and shuffling.  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    The patient was placed on Mirapex in 2002 and remains on that medication.  Once she was started on that medication she could immediately clog again.  She is currently on 1 mg 3 times a day.  She was placed on Stalevo 3 or 4 years later and is currently on Stalevo 100 mg, one tablet at 6 AM, half a tablet at 9 AM, 1 tablet at noon, half a tablet at 3 PM and one tablet at 6 PM.  In addition, she takes a regular carbidopa/levodopa 25/100 in the AM and chews it.  If she doesn't do that, she shuffles.  She chews that pill and then 45 mins later starts the Stalevo regimen.  She does state that Stalevo has become quite expensive for her and her insurance is going to change and asks about alternatives.    10/06/14 update:  The patient presents today, accompanied by her husband to supplement the history.  Last visit, I discontinued the patient's Stalevo, primarily because of cost and changed her to cupboard up a/levodopa 25/100, and we decided to hold the entacapone component.  She chews one tablet in the morning followed by carbidopa/levodopa 25/100 one tablet at 6 AM, half a tablet at 9 AM, 1 tablet at noon, half a tablet at 3 PM and one tablet at 6 PM.  She admits that she did not even take her 3 PM dose today, and is not sure that she even needs it.  Overall, from a Parkinson standpoint she feels great.  She admits to  some dyskinesia in the lower legs, but states that that is not bothersome.  Her biggest issue continues to be low back pain and left hip pain.  She has been back to Dr. Neomia Dear but did not get much relief.  She subsequently saw Dr. Lorin Mercy at [facility].  She was started on 100 mg of Neurontin at bedtime, but that only helped her sleep.  She states that first thing in the morning she has such pain in the left hip and back that she can barely walk.  01/07/15 update:  Pt f/u today re: PD.  She is on carbidopa/levodopa 25/100.  She chews one tablet in the morning followed by carbidopa/levodopa 25/100 one tablet at 6 AM, half a tablet at 9 AM, 1 tablet at noon, half a tablet at 3 PM and one tablet at 6 PM.  She is also on mirapex 1 mg tid.  She came off of meds today so that I could see what she looks like off.  She feels very stiff.  She feels that she isnt moving well.  She did have one fall since last visit.  She fell off of the stool at her vanity.   She didn't get hurt.   She saw Dr. Tamala Julian since last visit.  I  reviewed those records.  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  She is doing much better in that regard.  03/29/15 update:  Pt states that she is not doing as well as she was because of the back/leg pain again.  She continues to see Dr. Tamala Julian and has had further injections.  This one was in the bursa and she states that this one didn't help.  She is not walking as well.   She states that her PD meds are wearing off before they are due for the next dosing.  She takes one full tablet of levodopa at 5 AM (chewed), one tablet at 8 AM, half a tablet at 11 AM, 1 tablet at 1 PM, half a tablet at 4 PM and one tablet at 6 PM.  She remains on pramipexole 1.0 mg 3 times per day.  She just does not feel like she has the energy that she use to.  She has very little dyskinesia.  She has not had any falls.  05/25/15 update:  Pt is f/u today. I increased her  carbidopa/levodopa 25/100 to one tablet 6 times a day and she is on pramipexole 1.0 mg tid.  I referred her for neurorehab but she declined and she did not want therapy.  She has been to see Dr Tamala Julian and she had a SI joint injection.  She states that she uses a bar of soap in sheets for leg cramps and started putting that on her hip.  She now lathers soap on her hip and thinks that is why her hip is virtually pain free.  She also states that she was having weak spells and her gabapentin was d/c and she is doing better.  She is trying to exercise some at home (leg lifts, sit ups, dancing/clogging).  She is overall feeling great and better than she has is over a year.  She is very pleased.  10/07/15 update:  The patient is following up today regarding her Parkinson's disease.  She remains on pramipexole, 1.0 mg 3 times a day.  She is also on carbidopa/levodopa 1 tablet at 5 AM/8 AM/11 AM/1 PM/4 PM/6 PM, for a total of 600 mg of levodopa per day.  She has had some shuffling and the AM is the worst.  Also, whenever she gets up in the middle of the night to go to the bathroom, she can hardly get off of the bed and has trouble moving.  No falls since our last visit.  She has been trying to exercise/dance but that has been more troublesome lately.  No hallucinations but does have some "bad dreams."   I did review her records since our last visit.  She has seen Dr. Hulan Saas 2 times since our last visit regarding her hip and low back pain as well as SI joint dysfunction.  He ordered an EMG which was done by Dr. Posey Pronto on 09/21/2015.  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  She states that she is rubbing bar soap on it and she feels that this helps tremendously.  She did d/c baclofen because it was causing cognitive dulling.  She states that she d/c gabapentin as well as made her feel weak and without energy.    02/17/16 update:  The patient is following up today regarding her Parkinson's disease.  She remains on  pramipexole, 1.0 mg 3 times a day.  She is also on carbidopa/levodopa 1 tablet at 5 AM/8 AM/11 AM/1 PM/4  PM/6 PM, and I added carbidopa/levodopa 50/200 last visit.  She called me and stated that this made her worse, and I did not think that this made much sense, but held the medication and she still felt worse, so I asked her to follow-up with her primary care physician to make sure nothing else was going wrong.  She did follow-up with her primary care physician, but ultimately ended up staying off of the medication.  I did review her records since our last visit.  She underwent lumbar decompression surgery and was hospitalized from 12/20/2015 to 12/22/2015.  This was done by Dr. Sherwood Gambler.  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  She reports that she doesn't remember 13 days but she is much better.  She never experienced a set back with her Parkinsons disease.  She did fall during that 2nd hospital stay attempting to get up by herself and then she fell at the SNF during rehab but didn't get hurt.  After SNF rehab, she had [facility] at the home and they just stopped therapy a week and a half ago.    05/16/16 update:  The patient is following up today regarding her Parkinson's disease.  She remains on pramipexole, 1.0 mg 3 times a day.  She was on carbidopa/levodopa 1 tablet at 5 AM/8 AM/11 AM/1 PM/4 PM/6 PM but since last visit she changed the timing of the dosage (5am/6am/11am/12pm/4pm/6pm).  She had restarted her carbidopa/levodopa 50/200 at night, but was only on it for about 3 days and then she discontinued it.  She then called back and stated that she was worse, but also admits that she has held her morning 2 dosages of levodopa.  She stayed off of the night levodopa as she said that it made worse, even though she had called [place] after that was d/c  to say she was worse.  I told her she needed to stay on a regular schedule and recommended physical therapy.  She wanted to try water aerobics instead of formal physical therapy.   She states that she is better today but overall feels worse over the last month or so.  She is stiff and having trouble moving.  Mornings are definitely the biggest problem.  Can have issue in the afternoon but that timing/frequency isn't consistent like the AM.  Uses walker when goes out.  Saw Dr. Rita Ohara last week and told that back issues were not cause of her problem, although when she bends over or tries to get up, she still has pain.    08/16/16 update:   The patient follows up today regarding Parkinson's disease.  We changed the way she dosed her carbidopa/levodopa 25/100 to, 3 tablets between 5-6am/2 at 9am/1 at 1pm/2 at 5pm.  Reports that this was helpful.  She is also on pramipexole, 1 mg 3 times per day.   Reports that she is much better.  She thinks that all of her deterioration was just from taking a nighttime CR of levodopa.  She is walking somewhat.   She denies sleep attacks.  Denies compulsive behaviors.  She denies hallucinations.  She has had no falls since our last visit.  She fell a few times.  With one time her feet froze and she fell forward.  With one, she was in the grass and went to stand up and fell back.  With one, she was in the kitchen and fell.  Can't get her walker through doors and asks me for new RX.  She had lab work after our last visit including a urinalysis and chemistry which were normal.  She had a TSH by her primary care physician about a week after our visit and that was normal.  States that for the last 2 weeks she has had urinary frequency.  Went to UC and started on doxycyline and then had call after the cx and told she didn't need it.  Has had urinary incontinence.  Reports that her back is very slowly getting better.    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 Macrobid [Nitrofurantoin Macrocrystal] Swelling and Rash  . Baclofen Other (See Comments)    Kept her awake, affected memory  . Gabapentin Other (See Comments)    Weak feeling; made her feel " loopy".   . Cephalosporins Other (See Comments)    Cannot sleep  . Codeine Nausea Only    dizziness  . Darvocet [Propoxyphene N-Acetaminophen] Nausea And Vomiting  . Levaquin [Levofloxacin] Hives  . Penicillins Rash  . Selegiline Hcl Rash  . Tramadol Nausea And Vomiting  . Triamcinolone Itching    CURRENT MEDICATIONS:  Outpatient Encounter Prescriptions as of 08/16/2016  Medication Sig  . carbidopa-levodopa (SINEMET IR) 25-100 MG tablet TAKE 1 TABLET BY MOUTH 6 TIMES DAILY (Patient taking differently: TAKE 1 TABLET BY MOUTH 8 TIMES DAILY)  . cholecalciferol (VITAMIN D) 1000 UNITS tablet Take 5,000 Units by mouth daily.  [name] ibuprofen (ADVIL,MOTRIN) 200 MG tablet Take 200 mg by mouth every 6 (six) hours as needed. Reported on 12/24/2015  . Magnesium Oxide 500 MG TABS Take 1 tablet by mouth daily.  . polyethylene glycol (MIRALAX) packet Take 17 g by mouth daily.  . pramipexole (MIRAPEX) 1 MG tablet TAKE 1 TABLET BY MOUTH 3 TIMES DAILY  . ranitidine (ZANTAC) 150 MG tablet Take 150 mg by mouth daily as needed. For acid reflex   No facility-administered encounter medications on file as of 08/16/2016.     PAST MEDICAL HISTORY:   Past Medical History:  Diagnosis Date  . Anemia    as young girl and teenager  . Arthritis   . Chest pain, unspecified   . Constipation   . Costochondritis   . GERD (gastroesophageal reflux disease)   . Headache    ocular migraines  . History of hiatal hernia   . Hyperlipidemia 10-07-12  . Never smoked tobacco   . Parkinson's disease  ([place]) 10-07-12  . Parkinson's disease ([place])   . Prediabetes   . 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 Spinal stenosis     PAST SURGICAL HISTORY:   Past Surgical History:  Procedure Laterality Date  . ABDOMINAL HYSTERECTOMY    . APPENDECTOMY    . BACK SURGERY  12/20/2015  . BUNIONECTOMY Bilateral 11-11-3  . CARDIAC CATHETERIZATION Bilateral 2013  . COLONOSCOPY    . HERNIA REPAIR    . LEFT HEART CATHETERIZATION WITH CORONARY ANGIOGRAM N/A 07/03/2012   Procedure: LEFT HEART CATHETERIZATION WITH CORONARY ANGIOGRAM;  Surgeon: Troy Sine, MD;  Location: [facility] CATH LAB;  Service: Cardiovascular;  Laterality: N/A;  . REPAIR OF CEREBROSPINAL  FLUID LEAK N/A 12/24/2015   Procedure: LUMBAR WOUND EXPLORATION, REPAIR OF CEREBROSPINAL FLUID LEAK, PLACEMENT OF LUMBAR DRAIN;  Surgeon: Kevan Ny Ditty, MD;  Location: [facility]URO ORS;  Service: Neurosurgery;  Laterality: N/A;  . ROTATOR CUFF REPAIR Left 10-07-12    SOCIAL HISTORY:   Social History   Social History  . Marital status: Married    Spouse name: N/A  . Number of children: N/A  . Years of education: N/A   Occupational History  . retired     Chiropractor   Social History Main Topics  . Smoking status: Never Smoker  . Smokeless tobacco: Never Used  . Alcohol use No  . Drug use: No  . Sexual activity: Not on file   Other Topics Concern  . Not on file   Social History Narrative  . No narrative on file    FAMILY HISTORY:   Family Status  Relation Status  . Mother Deceased   esophageal cancer, heart attack  . Father Deceased   suicide  . Brother Deceased   suicide  . Brother Alive   HTN, hypercholesterolemia  . Brother Alive   mental retardation    ROS:      A complete 10 system review of systems was obtained and was unremarkable apart from what is mentioned above.  PHYSICAL EXAMINATION:    VITALS:   There were no vitals filed for this visit.  GEN:  The patient appears stated age and is in NAD. HEENT:   Normocephalic, atraumatic.  The mucous membranes are moist. The superficial temporal arteries are without ropiness or tenderness. CV:  RRR Lungs:  CTAB Neck/HEME:  There are no carotid bruits bilaterally. Skin:  Back incision is healing and no erythema or drainage.  Neurological examination:  Orientation: The patient is alert and oriented x3.  Cranial nerves: There is good facial symmetry.  No significant facial hypomimia.   Extraocular muscles are intact.  There are no square wave jerks.  The visual fields are full to confrontational testing. The speech is fluent and clear. Soft palate rises symmetrically and there is no tongue deviation. Hearing is intact to conversational tone. Sensation: Sensation is intact to light touch throughout. Motor: Strength is 5/5 in the bilateral upper and lower extremities.   Shoulder shrug is equal and symmetric.  There is no pronator drift.  Movement examination: Tone: There is normal tone in the UE/LE Abnormal movements: there is no dyskinesia today or tremor Coordination:  There is no decremation today with RAM's, with any form of RAMS, including alternating supination and pronation of the forearm, hand opening and closing, finger taps, heel taps and toe taps. Gait and Station: The patient has no difficulty getting out of the chair.  Stride length is much better and she actually dances in the hall for me to show me how well she is doing.  ASSESSMENT/PLAN:  1.  Idiopathic PD.    -continue Carbidopa Levodopa as follows: 3 tablets at 5:00 am, 2 tablets at 9:00 am, 1 tablet at 1:00 pm, 2 tablets at 5:00 pm  -having freezing at night when up multiple times to use the BR but convinced that the CR formulation q hs caused her to deteriorate.  Asked if she can just take extra carbidopa/levodopa 25/100 at night and I have no objection to that.  -Talked to her about risks of mirapex 1 mg tid as she ages but didn't change that today.  She has no SE with it.  -think  she would be great apokyn candidate but she refused this.  If nasal form of levodopa and SL apomorphine comes to the market, this would be an option.    -could be DBS candidate but she has refused  -RX for walker 2.  Urinary incontinence and frequency  -refer to urology.  Made appt for tomorrow. 3.  Back pain  -She is doing much better.  She is considering starting rock steady boxing in [place] and got approval from Dr. Rita Ohara 4.  RBD.  -  She did not want to take the clonazepam, primarily because of its addictive properties.  She did try it for a short period of time.  She tried Seroquel as well, but it caused diplopia.  She does not want to try anything further right now. 5 .  Follow-up with me in 3-4 months, sooner should new issues arise.  Time in the room with the patient was 30 min.  Greater than 50% in counseling.

## 2016-08-16 ENCOUNTER — Ambulatory Visit (INDEPENDENT_AMBULATORY_CARE_PROVIDER_SITE_OTHER): Payer: Medicare Other | Admitting: Neurology

## 2016-08-16 ENCOUNTER — Encounter: Payer: Self-pay | Admitting: Neurology

## 2016-08-16 VITALS — BP 112/60 | HR 74 | Ht 60.0 in | Wt 140.0 lb

## 2016-08-16 DIAGNOSIS — G20 Parkinson's disease: Secondary | ICD-10-CM

## 2016-08-16 DIAGNOSIS — N39498 Other specified urinary incontinence: Secondary | ICD-10-CM

## 2016-08-16 DIAGNOSIS — R350 Frequency of micturition: Secondary | ICD-10-CM | POA: Diagnosis not present

## 2016-08-16 NOTE — Patient Instructions (Signed)
Appt made with Dr. Loel Lofty at [facility] for 08/17/16 at 9:30 AM. Please call [phone] if this is not a good date/time.

## 2016-08-17 DIAGNOSIS — R351 Nocturia: Secondary | ICD-10-CM | POA: Diagnosis not present

## 2016-08-17 DIAGNOSIS — N3281 Overactive bladder: Secondary | ICD-10-CM | POA: Diagnosis not present

## 2016-08-24 ENCOUNTER — Ambulatory Visit: Payer: Self-pay | Admitting: Physician Assistant

## 2016-09-07 DIAGNOSIS — S60221A Contusion of right hand, initial encounter: Secondary | ICD-10-CM | POA: Diagnosis not present

## 2016-09-13 ENCOUNTER — Other Ambulatory Visit: Payer: Self-pay | Admitting: Neurology

## 2016-09-14 ENCOUNTER — Telehealth: Payer: Self-pay | Admitting: Neurology

## 2016-09-14 NOTE — Telephone Encounter (Signed)
Good advice. Thank you 

## 2016-09-14 NOTE — Telephone Encounter (Signed)
Spoke with patient and she states he feels fine some days and other times it feels like a "rubber band" when she is trying to walk. This happens mainly in the afternoon before her 5 pm dose of Levodopa and in the middle of the night.   Patient advised she could take an extra half - whole tablet of Levodopa if needed during these times if she feels herself freezing up (aware she could dissolve this to help it act faster). She is only on 800 mg of Levodopa right now.   I also spoke to her again about Apokyn injections which she refused at her last office visit. I explained the way they work and that she would be a good candidate for them. She has agreed to have me submit paperwork to the insurance company to see how expensive this would be - and if it isn't too costly she would be okay with trying this.   Will submit paperwork and let her know. She will call back sooner if needed.

## 2016-09-14 NOTE — Telephone Encounter (Signed)
Alexandra Lindsey [birth year]. She would like you to please call her. She said she can't hardly walk. She is getting like she was in June.  She wants to know if other patient's of yours are having these issues like her and is there anything that can help? Her cell is [phone] and home [phone]. Thank you

## 2016-09-15 DIAGNOSIS — Z23 Encounter for immunization: Secondary | ICD-10-CM | POA: Diagnosis not present

## 2016-09-18 ENCOUNTER — Telehealth: Payer: Self-pay | Admitting: Neurology

## 2016-09-18 NOTE — Telephone Encounter (Signed)
Patient states she received release of information sheet in the mail and she is going to send back to them.

## 2016-09-18 NOTE — Telephone Encounter (Signed)
Left message on machine for patient to call back.  To make her aware I need her signature to release information to [employer]. Awaiting call back.

## 2016-10-04 DIAGNOSIS — R030 Elevated blood-pressure reading, without diagnosis of hypertension: Secondary | ICD-10-CM | POA: Diagnosis not present

## 2016-10-04 DIAGNOSIS — M5416 Radiculopathy, lumbar region: Secondary | ICD-10-CM | POA: Diagnosis not present

## 2016-10-04 DIAGNOSIS — Z981 Arthrodesis status: Secondary | ICD-10-CM | POA: Diagnosis not present

## 2016-10-04 DIAGNOSIS — M4726 Other spondylosis with radiculopathy, lumbar region: Secondary | ICD-10-CM | POA: Diagnosis not present

## 2016-10-04 DIAGNOSIS — M5136 Other intervertebral disc degeneration, lumbar region: Secondary | ICD-10-CM | POA: Diagnosis not present

## 2016-10-04 DIAGNOSIS — Z6827 Body mass index (BMI) 27.0-27.9, adult: Secondary | ICD-10-CM | POA: Diagnosis not present

## 2016-11-17 ENCOUNTER — Ambulatory Visit (INDEPENDENT_AMBULATORY_CARE_PROVIDER_SITE_OTHER): Payer: Medicare Other | Admitting: Internal Medicine

## 2016-11-17 ENCOUNTER — Encounter: Payer: Self-pay | Admitting: Internal Medicine

## 2016-11-17 VITALS — BP 104/60 | HR 64 | Temp 97.3°F | Resp 16 | Ht 60.0 in | Wt 141.2 lb

## 2016-11-17 DIAGNOSIS — G972 Intracranial hypotension following ventricular shunting: Secondary | ICD-10-CM | POA: Diagnosis not present

## 2016-11-17 DIAGNOSIS — K219 Gastro-esophageal reflux disease without esophagitis: Secondary | ICD-10-CM | POA: Diagnosis not present

## 2016-11-17 DIAGNOSIS — E559 Vitamin D deficiency, unspecified: Secondary | ICD-10-CM | POA: Diagnosis not present

## 2016-11-17 DIAGNOSIS — Z79899 Other long term (current) drug therapy: Secondary | ICD-10-CM | POA: Diagnosis not present

## 2016-11-17 DIAGNOSIS — G20 Parkinson's disease: Secondary | ICD-10-CM | POA: Diagnosis not present

## 2016-11-17 DIAGNOSIS — I10 Essential (primary) hypertension: Secondary | ICD-10-CM

## 2016-11-17 DIAGNOSIS — Z136 Encounter for screening for cardiovascular disorders: Secondary | ICD-10-CM

## 2016-11-17 DIAGNOSIS — R7303 Prediabetes: Secondary | ICD-10-CM

## 2016-11-17 DIAGNOSIS — E782 Mixed hyperlipidemia: Secondary | ICD-10-CM

## 2016-11-17 DIAGNOSIS — M7702 Medial epicondylitis, left elbow: Secondary | ICD-10-CM

## 2016-11-17 DIAGNOSIS — Z1212 Encounter for screening for malignant neoplasm of rectum: Secondary | ICD-10-CM

## 2016-11-17 LAB — CBC WITH DIFFERENTIAL/PLATELET
BASOS ABS: 0 {cells}/uL (ref 0–200)
Basophils Relative: 0 %
Eosinophils Absolute: 128 cells/uL (ref 15–500)
Eosinophils Relative: 2 %
HEMATOCRIT: 37.3 % (ref 35.0–45.0)
HEMOGLOBIN: 12 g/dL (ref 11.7–15.5)
LYMPHS ABS: 1728 {cells}/uL (ref 850–3900)
LYMPHS PCT: 27 %
MCH: 29.6 pg (ref 27.0–33.0)
MCHC: 32.2 g/dL (ref 32.0–36.0)
MCV: 92.1 fL (ref 80.0–100.0)
MONO ABS: 576 {cells}/uL (ref 200–950)
MPV: 10.4 fL (ref 7.5–12.5)
Monocytes Relative: 9 %
NEUTROS PCT: 62 %
Neutro Abs: 3968 cells/uL (ref 1500–7800)
Platelets: 300 10*3/uL (ref 140–400)
RBC: 4.05 MIL/uL (ref 3.80–5.10)
RDW: 14.4 % (ref 11.0–15.0)
WBC: 6.4 10*3/uL (ref 3.8–10.8)

## 2016-11-17 LAB — TSH: TSH: 1.33 m[IU]/L

## 2016-11-17 LAB — HEMOGLOBIN A1C
HEMOGLOBIN A1C: 5.4 % (ref <5.7)
Mean Plasma Glucose: 108 mg/dL

## 2016-11-17 MED ORDER — DEXAMETHASONE SODIUM PHOSPHATE 10 MG/ML IJ SOLN: 10.0000 mg | Freq: Once | INTRAMUSCULAR | Status: DC

## 2016-11-17 NOTE — Progress Notes (Signed)
[facility] Unk Pinto, M.D.    Uvaldo Bristle. Silverio Lay, P.A.-C      Starlyn Skeans, P.A.-C  [facility]                [address] [place], [place] [id number] Telephone [phone] Telefax [phone]  Annual Screening/Preventative Visit & Comprehensive Evaluation &  Examination     This very nice 75 y.o. MWFpresents for a Screening/Preventative Visit & comprehensive evaluation and management of multiple medical co-morbidities.  Patient has been followed for HTN, Prediabetes, Hyperlipidemia and Vitamin D Deficiency. She also has GERD controlled mostly with diet as she infrequently takes her Ranitidine. In Jan 2016, she had a Cx epidural removed and has done well since.      Patient also is c/o recurrent pain of the Left elbow which is limiting her activity and use of her LUE.      Patient also has  Parkinson's (2002) and has done very well and currently is followed by Dr [name].      Patient has hx/o labile HTN predates circa 2005 and had been treated in the past with diuretics. In 2008 and 2013 she had negative Cardiolite scans. Patient's BP has been controlled at home and patient denies any cardiac symptoms as chest pain, palpitations, shortness of breath, dizziness or ankle swelling. Today's BP is at goal - 104/60.      Patient's hyperlipidemia is controlled with diet and medications. Patient denies myalgias or other medication SE's. Last lipids were  Lab Results  Component Value Date   CHOL 185 05/23/2016   HDL 51 05/23/2016   LDLCALC 109 05/23/2016   TRIG 124 05/23/2016   CHOLHDL 3.6 05/23/2016      Patient has prediabetes predating since 2011 with A1c 6.3% and then 5.9% in 2013.      She denies reactive hypoglycemic symptoms, visual blurring, diabetic polys, or paresthesias. Last A1c was not at goal: Lab Results  Component Value Date   HGBA1C 5.8 (H) 05/23/2016      Finally, patient has  history of Vitamin D Deficiency in 2008 of "23" and last Vitamin D was at goal: Lab Results  Component Value Date   VD25OH 59 05/23/2016   Current Outpatient Prescriptions on File Prior to Visit  Medication Sig  . carbidopa-levodopa (SINEMET IR) 25-100 MG tablet TAKE 1 TABLET BY MOUTH 6 TIMES DAILY  . cholecalciferol (VITAMIN D) 1000 UNITS tablet Take 5,000 Units by mouth daily.  [name] ibuprofen (ADVIL,MOTRIN) 200 MG tablet Take 200 mg by mouth every 6 (six) hours as needed. Reported on 12/24/2015  . polyethylene glycol (MIRALAX) packet Take 17 g by mouth daily.  . pramipexole (MIRAPEX) 1 MG tablet TAKE 1 TABLET BY MOUTH 3 TIMES DAILY  . ranitidine (ZANTAC) 150 MG tablet Take 150 mg by mouth daily as needed. For acid reflex  . vitamin B-12 (CYANOCOBALAMIN) 1000 MCG tablet Take 1,000 mcg by mouth daily.   No current facility-administered medications on file prior to visit.    Allergies  Allergen Reactions  . Macrobid [Nitrofurantoin Macrocrystal] Swelling and Rash  . Baclofen Other (See Comments)    Kept her awake, affected memory  . Gabapentin Other (See Comments)    Weak feeling; made her feel " loopy".   . Cephalosporins Other (See Comments)    Cannot sleep  . Codeine Nausea Only    dizziness  .  Darvocet [Propoxyphene N-Acetaminophen] Nausea And Vomiting  . Levaquin [Levofloxacin] Hives  . Penicillins Rash  . Selegiline Hcl Rash  . Tramadol Nausea And Vomiting  . Triamcinolone Itching   Past Medical History:  Diagnosis Date  . Anemia    as young girl and teenager  . Arthritis   . Chest pain, unspecified   . Constipation   . Costochondritis   . GERD (gastroesophageal reflux disease)   . Headache    ocular migraines  . History of hiatal hernia   . Hyperlipidemia 10-07-12  . Never smoked tobacco   . Parkinson's disease ([place]) 10-07-12  . Parkinson's disease ([place])   . Prediabetes   . 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 Spinal stenosis    Health Maintenance   Topic Date Due  . PNA vac Low Risk Adult (2 of 2 - PPSV23) 11/09/2016  . COLONOSCOPY  12/03/2016  . MAMMOGRAM  09/27/2017  . TETANUS/TDAP  09/09/2023  . INFLUENZA VACCINE  Completed  . DEXA SCAN  Completed  . ZOSTAVAX  Completed   Immunization History  Administered Date(s) Administered  . Influenza, High Dose Seasonal PF 09/08/2014, 10/04/2015  . Influenza-Unspecified 09/29/2016  . Pneumococcal Conjugate-13 11/10/2015  . Pneumococcal-Unspecified 11/27/2006  . Td 09/08/2013  . Zoster 06/27/2006   Past Surgical History:  Procedure Laterality Date  . ABDOMINAL HYSTERECTOMY    . APPENDECTOMY    . BACK SURGERY  12/20/2015  . BUNIONECTOMY Bilateral 11-11-3  . CARDIAC CATHETERIZATION Bilateral 2013  . COLONOSCOPY    . HERNIA REPAIR    . LEFT HEART CATHETERIZATION WITH CORONARY ANGIOGRAM N/A 07/03/2012   Procedure: LEFT HEART CATHETERIZATION WITH CORONARY ANGIOGRAM;  Surgeon: Troy Sine, MD;  Location: [facility] CATH LAB;  Service: Cardiovascular;  Laterality: N/A;  . REPAIR OF CEREBROSPINAL FLUID LEAK N/A 12/24/2015   Procedure: LUMBAR WOUND EXPLORATION, REPAIR OF CEREBROSPINAL FLUID LEAK, PLACEMENT OF LUMBAR DRAIN;  Surgeon: Kevan Ny Ditty, MD;  Location: [facility];  Service: Neurosurgery;  Laterality: N/A;  . ROTATOR CUFF REPAIR Left 10-07-12   Family History  Problem Relation Age of Onset  . Heart attack Mother   . Esophageal cancer Mother   . CVA Brother    Social History  Substance Use Topics  . Smoking status: Never Smoker  . Smokeless tobacco: Never Used  . Alcohol use No    ROS Constitutional: Denies fever, chills, weight loss/gain, headaches, insomnia,  night sweats, and change in appetite. Does c/o fatigue. Eyes: Denies redness, blurred vision, diplopia, discharge, itchy, watery eyes.  ENT: Denies discharge, congestion, post nasal drip, epistaxis, sore throat, earache, hearing loss, dental pain, Tinnitus, Vertigo, Sinus pain, snoring.  Cardio: Denies chest  pain, palpitations, irregular heartbeat, syncope, dyspnea, diaphoresis, orthopnea, PND, claudication, edema Respiratory: denies cough, dyspnea, DOE, pleurisy, hoarseness, laryngitis, wheezing.  Gastrointestinal: Denies dysphagia, heartburn, reflux, water brash, pain, cramps, nausea, vomiting, bloating, diarrhea, constipation, hematemesis, melena, hematochezia, jaundice, hemorrhoids Genitourinary: Denies dysuria, frequency, urgency, nocturia, hesitancy, discharge, hematuria, flank pain Breast: Breast lumps, nipple discharge, bleeding.  Musculoskeletal: Denies arthralgia, myalgia, stiffness, Jt. Swelling, pain, limp, and strain/sprain. Denies falls. Skin: Denies puritis, rash, hives, warts, acne, eczema, changing in skin lesion Neuro: No weakness, tremor, incoordination, spasms, paresthesia, pain Psychiatric: Denies confusion, memory loss, sensory loss. Denies Depression. Endocrine: Denies change in weight, skin, hair change, nocturia, and paresthesia, diabetic polys, visual blurring, hyper / hypo glycemic episodes.  Heme/Lymph: No excessive bleeding, bruising, enlarged lymph nodes.  Physical Exam  BP 104/60   Pulse 64  Temp 97.3 F (36.3 C)   Resp 16   Ht 5' (1.524 m)   Wt 141 lb 3.2 oz (64 kg)   BMI 27.58 kg/m   General Appearance: Well nourished and in no apparent distress.  Eyes: PERRLA, EOMs, conjunctiva no swelling or erythema, normal fundi and vessels. Sinuses: No frontal/maxillary tenderness ENT/Mouth: EACs patent / TMs  nl. Nares clear without erythema, swelling, mucoid exudates. Oral hygiene is good. No erythema, swelling, or exudate. Tongue normal, non-obstructing. Tonsils not swollen or erythematous. Hearing normal.  Neck: Supple, thyroid normal. No bruits, nodes or JVD. Respiratory: Respiratory effort normal.  BS equal and clear bilateral without rales, rhonci, wheezing or stridor. Cardio: Heart sounds are normal with regular rate and rhythm and no murmurs, rubs or gallops.  Peripheral pulses are normal and equal bilaterally without edema. No aortic or femoral bruits. Chest: symmetric with normal excursions and percussion. Breasts: Symmetric, without lumps, nipple discharge, retractions, or fibrocystic changes.  Abdomen: Flat, soft with bowel sounds active. Nontender, no guarding, rebound, hernias, masses, or organomegaly.  Lymphatics: Non tender without lymphadenopathy.  Genitourinary:  Musculoskeletal: Full ROM all peripheral extremities, joint stability, 5/5 strength, and normal gait. No cog wheeling. Exquisite point tenderness over the Left medial epicondyle of the Left elbow.  Skin: Warm and dry without rashes, lesions, cyanosis, clubbing or  ecchymosis.  Neuro: Cranial nerves intact, reflexes equal bilaterally. Normal muscle tone, no cerebellar symptoms. Sensation intact.  Pysch: Alert and oriented X 3, normal affect, Insight and Judgment appropriate.    Procedure: after informed consent and aseptic prep with alcohol, a mixture of 1 ml Lidocaine 1% and 1 ml of Dexamethasone (1o mg) was infiltrated into the left elbow medial epicondyle tendon insertion with immediate relief of patient's tenderness/pain.  Sterile band-aid was applied.   Assessment and Plan  1. Essential hypertension  - Microalbumin / creatinine urine ratio - EKG 12-Lead - Urinalysis, Routine w reflex microscopic - CBC with Differential/Platelet - BASIC METABOLIC PANEL WITH GFR - TSH  2. Mixed hyperlipidemia  - EKG 12-Lead - Hepatic function panel - Lipid panel - TSH  3. Prediabetes  - EKG 12-Lead - Hemoglobin A1c - Insulin, random  4. Vitamin D deficiency  - VITAMIN D 25 Hydroxy   5. Gastroesophageal reflux disease, esophagitis presence not specified   6. Parkinson's disease ([place])   7. Screening for rectal cancer  - POC Hemoccult Bld/Stl  8. Screening for ischemic heart disease  - EKG 12-Lead  9. Medication management  - Urinalysis, Routine w reflex  microscopic - CBC with Differential/Platelet - BASIC METABOLIC PANEL WITH GFR - Hepatic function panel - Magnesium       Continue prudent diet as discussed, weight control, BP monitoring, regular exercise, and medications. Discussed med's effects and SE's. Screening labs and tests as requested with regular follow-up as recommended. Over 40 minutes of exam, counseling, chart review and high complex critical decision making was performed.

## 2016-11-17 NOTE — Patient Instructions (Signed)

## 2016-11-18 LAB — HEPATIC FUNCTION PANEL
ALBUMIN: 3.8 g/dL (ref 3.6–5.1)
ALT: 6 U/L (ref 6–29)
AST: 11 U/L (ref 10–35)
Alkaline Phosphatase: 94 U/L (ref 33–130)
BILIRUBIN INDIRECT: 0.3 mg/dL (ref 0.2–1.2)
Bilirubin, Direct: 0.1 mg/dL (ref <=0.2)
TOTAL PROTEIN: 5.9 g/dL — AB (ref 6.1–8.1)
Total Bilirubin: 0.4 mg/dL (ref 0.2–1.2)

## 2016-11-18 LAB — URINALYSIS, ROUTINE W REFLEX MICROSCOPIC
BILIRUBIN URINE: NEGATIVE
GLUCOSE, UA: NEGATIVE
Hgb urine dipstick: NEGATIVE
Leukocytes, UA: NEGATIVE
Nitrite: NEGATIVE
PROTEIN: NEGATIVE
SPECIFIC GRAVITY, URINE: 1.023 (ref 1.001–1.035)
pH: 5 (ref 5.0–8.0)

## 2016-11-18 LAB — LIPID PANEL
CHOLESTEROL: 204 mg/dL — AB (ref <200)
HDL: 54 mg/dL (ref >50)
LDL CALC: 131 mg/dL — AB (ref <100)
TRIGLYCERIDES: 97 mg/dL (ref <150)
Total CHOL/HDL Ratio: 3.8 Ratio (ref <5.0)
VLDL: 19 mg/dL (ref <30)

## 2016-11-18 LAB — MICROALBUMIN / CREATININE URINE RATIO
CREATININE, URINE: 164 mg/dL (ref 20–320)
MICROALB/CREAT RATIO: 2 ug/mg{creat} (ref <30)
Microalb, Ur: 0.3 mg/dL

## 2016-11-18 LAB — BASIC METABOLIC PANEL WITH GFR
BUN: 25 mg/dL (ref 7–25)
CALCIUM: 9 mg/dL (ref 8.6–10.4)
CO2: 18 mmol/L — AB (ref 20–31)
CREATININE: 0.79 mg/dL (ref 0.60–0.93)
Chloride: 106 mmol/L (ref 98–110)
GFR, EST AFRICAN AMERICAN: 85 mL/min (ref >=60)
GFR, EST NON AFRICAN AMERICAN: 74 mL/min (ref >=60)
GLUCOSE: 92 mg/dL (ref 65–99)
Potassium: 4.3 mmol/L (ref 3.5–5.3)
Sodium: 142 mmol/L (ref 135–146)

## 2016-11-18 LAB — MAGNESIUM: MAGNESIUM: 2 mg/dL (ref 1.5–2.5)

## 2016-11-18 LAB — VITAMIN D 25 HYDROXY (VIT D DEFICIENCY, FRACTURES): Vit D, 25-Hydroxy: 58 ng/mL (ref 30–100)

## 2016-11-18 LAB — INSULIN, RANDOM: Insulin: 6.6 u[IU]/mL (ref 2.0–19.6)

## 2016-11-27 DIAGNOSIS — K529 Noninfective gastroenteritis and colitis, unspecified: Secondary | ICD-10-CM | POA: Diagnosis not present

## 2016-11-29 ENCOUNTER — Other Ambulatory Visit: Payer: Self-pay

## 2016-11-29 DIAGNOSIS — Z1212 Encounter for screening for malignant neoplasm of rectum: Secondary | ICD-10-CM

## 2016-11-29 LAB — POC HEMOCCULT BLD/STL (HOME/3-CARD/SCREEN)
Card #2 Fecal Occult Blod, POC: NEGATIVE
FECAL OCCULT BLD: NEGATIVE
FECAL OCCULT BLD: NEGATIVE

## 2016-12-18 DIAGNOSIS — J209 Acute bronchitis, unspecified: Secondary | ICD-10-CM | POA: Diagnosis not present

## 2017-01-09 ENCOUNTER — Ambulatory Visit: Payer: Medicare Other | Admitting: Neurology

## 2017-01-09 DIAGNOSIS — M4726 Other spondylosis with radiculopathy, lumbar region: Secondary | ICD-10-CM | POA: Diagnosis not present

## 2017-01-09 DIAGNOSIS — M5136 Other intervertebral disc degeneration, lumbar region: Secondary | ICD-10-CM | POA: Diagnosis not present

## 2017-01-09 DIAGNOSIS — G8929 Other chronic pain: Secondary | ICD-10-CM | POA: Diagnosis not present

## 2017-01-09 DIAGNOSIS — Z981 Arthrodesis status: Secondary | ICD-10-CM | POA: Diagnosis not present

## 2017-01-09 DIAGNOSIS — M545 Low back pain: Secondary | ICD-10-CM | POA: Diagnosis not present

## 2017-01-10 ENCOUNTER — Other Ambulatory Visit: Payer: Self-pay | Admitting: Neurosurgery

## 2017-01-10 DIAGNOSIS — M545 Low back pain: Secondary | ICD-10-CM

## 2017-01-10 DIAGNOSIS — Z981 Arthrodesis status: Secondary | ICD-10-CM

## 2017-01-11 NOTE — Progress Notes (Signed)
Alexandra Lindsey was seen today in the movement disorders clinic for neurologic consultation at the request of MCKEOWN,WILLIAM DAVID, MD.  The patient has previously seen both Dr. Erling Cruz and Dr. Rexene Alberts.  The records that were available to me were reviewed.  The consultation is for the evaluation and treatment of Parkinson's disease.  The patient has had a diagnosis of Parkinson's disease since at least 2002.  The first symptom was inability to get out of a chair and dragging the L leg and shuffling.  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    The patient was placed on Mirapex in 2002 and remains on that medication.  Once she was started on that medication she could immediately clog again.  She is currently on 1 mg 3 times a day.  She was placed on Stalevo 3 or 4 years later and is currently on Stalevo 100 mg, one tablet at 6 AM, half a tablet at 9 AM, 1 tablet at noon, half a tablet at 3 PM and one tablet at 6 PM.  In addition, she takes a regular carbidopa/levodopa 25/100 in the AM and chews it.  If she doesn't do that, she shuffles.  She chews that pill and then 45 mins later starts the Stalevo regimen.  She does state that Stalevo has become quite expensive for her and her insurance is going to change and asks about alternatives.    10/06/14 update:  The patient presents today, accompanied by her husband to supplement the history.  Last visit, I discontinued the patient's Stalevo, primarily because of cost and changed her to cupboard up a/levodopa 25/100, and we decided to hold the entacapone component.  She chews one tablet in the morning followed by carbidopa/levodopa 25/100 one tablet at 6 AM, half a tablet at 9 AM, 1 tablet at noon, half a tablet at 3 PM and one tablet at 6 PM.  She admits that she did not even take her 3 PM dose today, and is not sure that she even needs it.  Overall, from a Parkinson standpoint she feels great.  She admits to  some dyskinesia in the lower legs, but states that that is not bothersome.  Her biggest issue continues to be low back pain and left hip pain.  She has been back to Dr. Neomia Dear but did not get much relief.  She subsequently saw Dr. Lorin Mercy at [facility].  She was started on 100 mg of Neurontin at bedtime, but that only helped her sleep.  She states that first thing in the morning she has such pain in the left hip and back that she can barely walk.  01/07/15 update:  Pt f/u today re: PD.  She is on carbidopa/levodopa 25/100.  She chews one tablet in the morning followed by carbidopa/levodopa 25/100 one tablet at 6 AM, half a tablet at 9 AM, 1 tablet at noon, half a tablet at 3 PM and one tablet at 6 PM.  She is also on mirapex 1 mg tid.  She came off of meds today so that I could see what she looks like off.  She feels very stiff.  She feels that she isnt moving well.  She did have one fall since last visit.  She fell off of the stool at her vanity.   She didn't get hurt.   She saw Dr. Tamala Julian since last visit.  I  reviewed those records.  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  She is doing much better in that regard.  03/29/15 update:  Pt states that she is not doing as well as she was because of the back/leg pain again.  She continues to see Dr. Tamala Julian and has had further injections.  This one was in the bursa and she states that this one didn't help.  She is not walking as well.   She states that her PD meds are wearing off before they are due for the next dosing.  She takes one full tablet of levodopa at 5 AM (chewed), one tablet at 8 AM, half a tablet at 11 AM, 1 tablet at 1 PM, half a tablet at 4 PM and one tablet at 6 PM.  She remains on pramipexole 1.0 mg 3 times per day.  She just does not feel like she has the energy that she use to.  She has very little dyskinesia.  She has not had any falls.  05/25/15 update:  Pt is f/u today. I increased her  carbidopa/levodopa 25/100 to one tablet 6 times a day and she is on pramipexole 1.0 mg tid.  I referred her for neurorehab but she declined and she did not want therapy.  She has been to see Dr Tamala Julian and she had a SI joint injection.  She states that she uses a bar of soap in sheets for leg cramps and started putting that on her hip.  She now lathers soap on her hip and thinks that is why her hip is virtually pain free.  She also states that she was having weak spells and her gabapentin was d/c and she is doing better.  She is trying to exercise some at home (leg lifts, sit ups, dancing/clogging).  She is overall feeling great and better than she has is over a year.  She is very pleased.  10/07/15 update:  The patient is following up today regarding her Parkinson's disease.  She remains on pramipexole, 1.0 mg 3 times a day.  She is also on carbidopa/levodopa 1 tablet at 5 AM/8 AM/11 AM/1 PM/4 PM/6 PM, for a total of 600 mg of levodopa per day.  She has had some shuffling and the AM is the worst.  Also, whenever she gets up in the middle of the night to go to the bathroom, she can hardly get off of the bed and has trouble moving.  No falls since our last visit.  She has been trying to exercise/dance but that has been more troublesome lately.  No hallucinations but does have some "bad dreams."   I did review her records since our last visit.  She has seen Dr. Hulan Saas 2 times since our last visit regarding her hip and low back pain as well as SI joint dysfunction.  He ordered an EMG which was done by Dr. Posey Pronto on 09/21/2015.  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  She states that she is rubbing bar soap on it and she feels that this helps tremendously.  She did d/c baclofen because it was causing cognitive dulling.  She states that she d/c gabapentin as well as made her feel weak and without energy.    02/17/16 update:  The patient is following up today regarding her Parkinson's disease.  She remains on  pramipexole, 1.0 mg 3 times a day.  She is also on carbidopa/levodopa 1 tablet at 5 AM/8 AM/11 AM/1 PM/4  PM/6 PM, and I added carbidopa/levodopa 50/200 last visit.  She called me and stated that this made her worse, and I did not think that this made much sense, but held the medication and she still felt worse, so I asked her to follow-up with her primary care physician to make sure nothing else was going wrong.  She did follow-up with her primary care physician, but ultimately ended up staying off of the medication.  I did review her records since our last visit.  She underwent lumbar decompression surgery and was hospitalized from 12/20/2015 to 12/22/2015.  This was done by Dr. Sherwood Gambler.  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  She reports that she doesn't remember 13 days but she is much better.  She never experienced a set back with her Parkinsons disease.  She did fall during that 2nd hospital stay attempting to get up by herself and then she fell at the SNF during rehab but didn't get hurt.  After SNF rehab, she had [facility] at the home and they just stopped therapy a week and a half ago.    05/16/16 update:  The patient is following up today regarding her Parkinson's disease.  She remains on pramipexole, 1.0 mg 3 times a day.  She was on carbidopa/levodopa 1 tablet at 5 AM/8 AM/11 AM/1 PM/4 PM/6 PM but since last visit she changed the timing of the dosage (5am/6am/11am/12pm/4pm/6pm).  She had restarted her carbidopa/levodopa 50/200 at night, but was only on it for about 3 days and then she discontinued it.  She then called back and stated that she was worse, but also admits that she has held her morning 2 dosages of levodopa.  She stayed off of the night levodopa as she said that it made worse, even though she had called [place] after that was d/c  to say she was worse.  I told her she needed to stay on a regular schedule and recommended physical therapy.  She wanted to try water aerobics instead of formal physical therapy.   She states that she is better today but overall feels worse over the last month or so.  She is stiff and having trouble moving.  Mornings are definitely the biggest problem.  Can have issue in the afternoon but that timing/frequency isn't consistent like the AM.  Uses walker when goes out.  Saw Dr. Rita Ohara last week and told that back issues were not cause of her problem, although when she bends over or tries to get up, she still has pain.    08/16/16 update:   The patient follows up today regarding Parkinson's disease.  We changed the way she dosed her carbidopa/levodopa 25/100 to, 3 tablets between 5-6am/2 at 9am/1 at 1pm/2 at 5pm.  Reports that this was helpful.  She is also on pramipexole, 1 mg 3 times per day.   Reports that she is much better.  She thinks that all of her deterioration was just from taking a nighttime CR of levodopa.  She is walking somewhat.   She denies sleep attacks.  Denies compulsive behaviors.  She denies hallucinations.  She has had no falls since our last visit.  She fell a few times.  With one time her feet froze and she fell forward.  With one, she was in the grass and went to stand up and fell back.  With one, she was in the kitchen and fell.  Can't get her walker through doors and asks me for new RX.  She had lab work after our last visit including a urinalysis and chemistry which were normal.  She had a TSH by her primary care physician about a week after our visit and that was normal.  States that for the last 2 weeks she has had urinary frequency.  Went to UC and started on doxycyline and then had call after the cx and told she didn't need it.  Has had urinary incontinence.  Reports that her back is very slowly getting better.    01/12/17 update:  Patient follows up today.  She is on  carbidopa/levodopa 25/100, 3 tablets between 5 and 6 AM/2 tablets at 9 AM/1 tablet at 1 PM/2 tablets at 5 PM and then will take another at bedtime.  She is on pramipexole 1 mg 3 times per day.  Last visit, I talked to her about Apokyn injections for freezing, and she did not think that she was interested, but called back after her last visit and stated that she was not doing well and was interested in the [place].  We sent her the release of information form to get her started on it but she didn't send it back.  She states that she just decided she didn't want to do that.  She has had no hallucinations.  No lightheadedness or near syncope.  She did see [facility] on 08/17/2016.  I reviewed those notes, which I received just yesterday (called and requested them).  She was started on Myrbetriq.  She states that she didn't need it and the bladder got better.  She did put a "potty chair" next to her bed so she didn't have to walk so far in the middle of the night.  She is having more problems with her back and she an MRI and CT of the back with Dr. Rita Ohara.  She has fallen several times.  She thinks her back is the reason for the falls.  Not doing therapy and Dr. Rita Ohara told her to wait on that for the scans.  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 Macrobid [Nitrofurantoin Macrocrystal] Swelling and Rash  . Baclofen Other (See Comments)    Kept her awake, affected memory  . Gabapentin Other (See Comments)    Weak feeling; made her feel " loopy".   . Cephalosporins Other (See Comments)    Cannot sleep  . Codeine Nausea Only    dizziness  . Darvocet [Propoxyphene N-Acetaminophen] Nausea And Vomiting  . Levaquin [Levofloxacin] Hives  .  Penicillins Rash  . Selegiline Hcl Rash  . Tramadol Nausea And Vomiting  . Triamcinolone Itching    CURRENT MEDICATIONS:  Outpatient Encounter Prescriptions as of 01/12/2017  Medication Sig  . carbidopa-levodopa (SINEMET IR) 25-100 MG tablet TAKE 1 TABLET BY MOUTH 6 TIMES DAILY  . cholecalciferol (VITAMIN D) 1000 UNITS tablet Take 5,000 Units by mouth daily.  [name] ibuprofen (ADVIL,MOTRIN) 200 MG tablet Take 200 mg by mouth every 6 (six) hours as needed. Reported on 12/24/2015  . polyethylene glycol (MIRALAX) packet Take 17 g by mouth daily.  . pramipexole (MIRAPEX) 1 MG tablet TAKE  1 TABLET BY MOUTH 3 TIMES DAILY  . ranitidine (ZANTAC) 150 MG tablet Take 150 mg by mouth daily as needed. For acid reflex  . vitamin B-12 (CYANOCOBALAMIN) 1000 MCG tablet Take 1,000 mcg by mouth daily.   Facility-Administered Encounter Medications as of 01/12/2017  Medication  . dexamethasone (DECADRON) injection 10 mg    PAST MEDICAL HISTORY:   Past Medical History:  Diagnosis Date  . Anemia    as young girl and teenager  . Arthritis   . Chest pain, unspecified   . Constipation   . Costochondritis   . GERD (gastroesophageal reflux disease)   . Headache    ocular migraines  . History of hiatal hernia   . Hyperlipidemia 10-07-12  . Never smoked tobacco   . Parkinson's disease ([place]) 10-07-12  . Parkinson's disease ([place])   . Prediabetes   . 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 Spinal stenosis     PAST SURGICAL HISTORY:   Past Surgical History:  Procedure Laterality Date  . ABDOMINAL HYSTERECTOMY    . APPENDECTOMY    . BACK SURGERY  12/20/2015  . BUNIONECTOMY Bilateral 11-11-3  . CARDIAC CATHETERIZATION Bilateral 2013  . COLONOSCOPY    . HERNIA REPAIR    . LEFT HEART CATHETERIZATION WITH CORONARY ANGIOGRAM N/A 07/03/2012   Procedure: LEFT HEART CATHETERIZATION WITH CORONARY ANGIOGRAM;  Surgeon: Troy Sine, MD;  Location: [facility] CATH LAB;  Service: Cardiovascular;  Laterality: N/A;  .  REPAIR OF CEREBROSPINAL FLUID LEAK N/A 12/24/2015   Procedure: LUMBAR WOUND EXPLORATION, REPAIR OF CEREBROSPINAL FLUID LEAK, PLACEMENT OF LUMBAR DRAIN;  Surgeon: Kevan Ny Ditty, MD;  Location: [facility]URO ORS;  Service: Neurosurgery;  Laterality: N/A;  . ROTATOR CUFF REPAIR Left 10-07-12    SOCIAL HISTORY:   Social History   Social History  . Marital status: Married    Spouse name: N/A  . Number of children: N/A  . Years of education: N/A   Occupational History  . retired     Chiropractor   Social History Main Topics  . Smoking status: Never Smoker  . Smokeless tobacco: Never Used  . Alcohol use No  . Drug use: No  . Sexual activity: Not on file   Other Topics Concern  . Not on file   Social History Narrative  . No narrative on file    FAMILY HISTORY:   Family Status  Relation Status  . Mother Deceased   esophageal cancer, heart attack  . Father Deceased   suicide  . Brother Deceased   suicide  . Brother Deceased   HTN, hypercholesterolemia  . Brother Alive   mental retardation    ROS:      A complete 10 system review of systems was obtained and was unremarkable apart from what is mentioned above.  PHYSICAL EXAMINATION:    VITALS:   Vitals:   01/12/17 1243  BP: 100/64  Pulse: 60  SpO2: 97%  Weight: 141 lb 1 oz (64 kg)  Height: 5' (1.524 m)    GEN:  The patient appears stated age and is in NAD. HEENT:  Normocephalic, atraumatic.  The mucous membranes are moist. The superficial temporal arteries are without ropiness or tenderness. CV:  RRR Lungs:  CTAB Neck/HEME:  There are no carotid bruits bilaterally. Skin:  Back incision is healing and no erythema or drainage.  Neurological examination:  Orientation: The patient is alert and oriented x3.  Cranial nerves: There is good facial symmetry.  No significant facial  hypomimia.   Extraocular muscles are intact.  There are no square wave jerks.  The visual fields are full to confrontational testing.  The speech is fluent and clear. Soft palate rises symmetrically and there is no tongue deviation. Hearing is intact to conversational tone. Sensation: Sensation is intact to light touch throughout. Motor: Strength is 5/5 in the bilateral upper and lower extremities.   Shoulder shrug is equal and symmetric.  There is no pronator drift.  Movement examination: Tone: There is normal tone in the UE/LE Abnormal movements: there is moderate amount of dyskinesia in the trunk and legs Coordination:  There is no decremation today with RAM's, with any form of RAMS, including alternating supination and pronation of the forearm, hand opening and closing, finger taps, heel taps and toe taps. Gait and Station: The patient has no difficulty getting out of the chair.  Stride length is much better and she again dances in the hall for me.  ASSESSMENT/PLAN:  1.  Idiopathic PD.    -continue Carbidopa Levodopa as follows: 3 tablets at 5:00 am, 2 tablets at 9:00 am, 1 tablet at 1:00 pm, 2 tablets at 5:00 pm and one at night.  -take extra carbidopa/levodopa 25/100 prn since she doesn't want to try apokyn  -Talked to her about risks of mirapex 1 mg tid as she ages but didn't change that today.  She has no SE with it.  -just doesn't want apokyn but having unpredictable freezing episodes.  If nasal form of levodopa and SL apomorphine comes to the market, this would be an option and I would suspect that coming to the market in next 6 months if goes well as have applied for new drug application.    -could be DBS candidate but she has refused  -encouraged to use walker more often  -let me know when wants PT 2.  Urinary incontinence and frequency  -doing better in this regard 3.  Back pain  -She is doing worse again and seeing Dr. Rita Ohara and having MRI.  Has had several falls since her back surgery. 4.  RBD.  -  She did not want to take the clonazepam, primarily because of its addictive properties.  She did try it for a  short period of time.  She tried Seroquel as well, but it caused diplopia.  She does not want to try anything further right now. 5 .  Follow-up with me in 4 months, sooner should new issues arise.  Time in the room with the patient was 25 min.  Greater than 50% in counseling.

## 2017-01-12 ENCOUNTER — Encounter: Payer: Self-pay | Admitting: Neurology

## 2017-01-12 ENCOUNTER — Ambulatory Visit (INDEPENDENT_AMBULATORY_CARE_PROVIDER_SITE_OTHER): Payer: Medicare Other | Admitting: Neurology

## 2017-01-12 VITALS — BP 100/64 | HR 60 | Ht 60.0 in | Wt 141.1 lb

## 2017-01-12 DIAGNOSIS — M5441 Lumbago with sciatica, right side: Secondary | ICD-10-CM | POA: Diagnosis not present

## 2017-01-12 DIAGNOSIS — G20B1 Parkinson's disease with dyskinesia, without mention of fluctuations: Secondary | ICD-10-CM

## 2017-01-12 DIAGNOSIS — G20 Parkinson's disease: Secondary | ICD-10-CM | POA: Diagnosis not present

## 2017-01-12 DIAGNOSIS — G8929 Other chronic pain: Secondary | ICD-10-CM | POA: Diagnosis not present

## 2017-01-12 DIAGNOSIS — M5442 Lumbago with sciatica, left side: Secondary | ICD-10-CM | POA: Diagnosis not present

## 2017-01-12 DIAGNOSIS — G249 Dystonia, unspecified: Secondary | ICD-10-CM | POA: Diagnosis not present

## 2017-01-12 DIAGNOSIS — G20A1 Parkinson's disease without dyskinesia, without mention of fluctuations: Secondary | ICD-10-CM

## 2017-01-17 ENCOUNTER — Ambulatory Visit
Admission: RE | Admit: 2017-01-17 | Discharge: 2017-01-17 | Disposition: A | Discharge status: Home / Self Care | Payer: Medicare Other | Source: Ambulatory Visit | Attending: Neurosurgery | Admitting: Neurosurgery

## 2017-01-17 DIAGNOSIS — M5126 Other intervertebral disc displacement, lumbar region: Secondary | ICD-10-CM | POA: Diagnosis not present

## 2017-01-17 DIAGNOSIS — M545 Low back pain: Secondary | ICD-10-CM

## 2017-01-17 DIAGNOSIS — Z981 Arthrodesis status: Secondary | ICD-10-CM

## 2017-01-17 MED ORDER — GADOBENATE DIMEGLUMINE 529 MG/ML IV SOLN
12.0000 mL | Freq: Once | INTRAVENOUS | Status: AC | PRN
  Administered 2017-01-17: 12 mL via INTRAVENOUS

## 2017-01-18 DIAGNOSIS — M4726 Other spondylosis with radiculopathy, lumbar region: Secondary | ICD-10-CM | POA: Diagnosis not present

## 2017-01-18 DIAGNOSIS — M5136 Other intervertebral disc degeneration, lumbar region: Secondary | ICD-10-CM | POA: Diagnosis not present

## 2017-01-18 DIAGNOSIS — Z981 Arthrodesis status: Secondary | ICD-10-CM | POA: Diagnosis not present

## 2017-01-18 DIAGNOSIS — M545 Low back pain: Secondary | ICD-10-CM | POA: Diagnosis not present

## 2017-01-26 ENCOUNTER — Other Ambulatory Visit: Payer: Self-pay | Admitting: Neurology

## 2017-02-13 DIAGNOSIS — M7710 Lateral epicondylitis, unspecified elbow: Secondary | ICD-10-CM | POA: Diagnosis not present

## 2017-02-26 ENCOUNTER — Encounter: Payer: Self-pay | Admitting: Internal Medicine

## 2017-02-26 ENCOUNTER — Ambulatory Visit (INDEPENDENT_AMBULATORY_CARE_PROVIDER_SITE_OTHER): Payer: Medicare Other | Admitting: Internal Medicine

## 2017-02-26 VITALS — BP 122/60 | HR 78 | Temp 98.0°F | Resp 16 | Ht 60.0 in | Wt 138.0 lb

## 2017-02-26 DIAGNOSIS — G5702 Lesion of sciatic nerve, left lower limb: Secondary | ICD-10-CM | POA: Diagnosis not present

## 2017-02-26 DIAGNOSIS — Z0001 Encounter for general adult medical examination with abnormal findings: Secondary | ICD-10-CM

## 2017-02-26 DIAGNOSIS — Z23 Encounter for immunization: Secondary | ICD-10-CM | POA: Diagnosis not present

## 2017-02-26 DIAGNOSIS — G9782 Other postprocedural complications and disorders of nervous system: Secondary | ICD-10-CM

## 2017-02-26 DIAGNOSIS — Z136 Encounter for screening for cardiovascular disorders: Secondary | ICD-10-CM | POA: Diagnosis not present

## 2017-02-26 DIAGNOSIS — Z79899 Other long term (current) drug therapy: Secondary | ICD-10-CM

## 2017-02-26 DIAGNOSIS — M533 Sacrococcygeal disorders, not elsewhere classified: Secondary | ICD-10-CM

## 2017-02-26 DIAGNOSIS — G20 Parkinson's disease: Secondary | ICD-10-CM

## 2017-02-26 DIAGNOSIS — M7061 Trochanteric bursitis, right hip: Secondary | ICD-10-CM

## 2017-02-26 DIAGNOSIS — I10 Essential (primary) hypertension: Secondary | ICD-10-CM | POA: Diagnosis not present

## 2017-02-26 DIAGNOSIS — E782 Mixed hyperlipidemia: Secondary | ICD-10-CM

## 2017-02-26 DIAGNOSIS — G4752 REM sleep behavior disorder: Secondary | ICD-10-CM

## 2017-02-26 DIAGNOSIS — K219 Gastro-esophageal reflux disease without esophagitis: Secondary | ICD-10-CM

## 2017-02-26 DIAGNOSIS — E559 Vitamin D deficiency, unspecified: Secondary | ICD-10-CM

## 2017-02-26 DIAGNOSIS — M7062 Trochanteric bursitis, left hip: Secondary | ICD-10-CM

## 2017-02-26 DIAGNOSIS — R7303 Prediabetes: Secondary | ICD-10-CM

## 2017-02-26 DIAGNOSIS — R6889 Other general symptoms and signs: Secondary | ICD-10-CM

## 2017-02-26 DIAGNOSIS — G479 Sleep disorder, unspecified: Secondary | ICD-10-CM

## 2017-02-26 DIAGNOSIS — M5416 Radiculopathy, lumbar region: Secondary | ICD-10-CM

## 2017-02-26 DIAGNOSIS — M48062 Spinal stenosis, lumbar region with neurogenic claudication: Secondary | ICD-10-CM

## 2017-02-26 DIAGNOSIS — Z0000 Encounter for general adult medical examination without abnormal findings: Secondary | ICD-10-CM

## 2017-02-26 NOTE — Progress Notes (Signed)
MEDICARE ANNUAL WELLNESS VISIT AND FOLLOW UP Assessment:   1. Need for prophylactic vaccination against Streptococcus pneumoniae (pneumococcus)  - Pneumococcal polysaccharide vaccine 23-valent greater than or equal to 75yo subcutaneous/IM  2. Essential hypertension -cont meds -well controlled -dash diet -exercise as tolerated -monitor at home  3. Gastroesophageal reflux disease, esophagitis presence not specified -cont medications -avoid trigger foods -stay upright for at least 2 hours after meals  4. Lumbar radiculopathy -followed by Dr. Sherwood Gambler  5. Parkinson's disease ([place]) -followed by Dr. Erling Cruz  6. Piriformis syndrome of left side -followed by Dr. Rita Ohara  7. Postprocedural pseudomeningocele -resolved after surgery  8. Greater trochanteric bursitis of left hip -followed by ortho  9. Greater trochanteric bursitis of right hip -followed by ortho  10. SI (sacroiliac) joint dysfunction -followed by ortho  11. Mixed hyperlipidemia -currently diet controlled -not interested in using medications -not at goal  12. Lumbar stenosis with neurogenic claudication -followed by neurosurgery  13. Medication management -obtain labs at next visit  14. Prediabetes -cont diet and exercise  15. RBD (REM behavioral disorder) -followed by Dr. Erling Cruz  16. Sleep disorder -followed by Dr. Erling Cruz  17. Vitamin D deficiency -cont Vit D    Over 30 minutes of exam, counseling, chart review, and critical decision making was performed  Future Appointments Date Time Provider [place]  02/26/2017 10:30 AM Starlyn Skeans, PA-C [facility] None  05/16/2017 8:15 AM Eustace Quail [name], DO [facility] None  06/05/2017 10:30 AM Unk Pinto, MD [facility] None  12/26/2017 10:00 AM Unk Pinto, MD [facility] None     Plan:   During the course of the visit the patient was educated and counseled about appropriate screening and preventive services including:    Pneumococcal  vaccine   Influenza vaccine  Prevnar 13  Td vaccine  Screening electrocardiogram  Colorectal cancer screening  Diabetes screening  Glaucoma screening  Nutrition counseling    Subjective:  Alexandra Lindsey is a 76 y.o. female who presents for Medicare Annual Wellness Visit and 3 month follow up for HTN, hyperlipidemia, prediabetes, and vitamin D Def.   Her blood pressure has been controlled at home, today their BP is BP: 122/60 She does workout. She denies chest pain, shortness of breath, dizziness.    She is not on cholesterol medication and denies myalgias. Her cholesterol is at goal. The cholesterol last visit was:   Lab Results  Component Value Date   CHOL 204 (H) 11/17/2016   HDL 54 11/17/2016   LDLCALC 131 (H) 11/17/2016   TRIG 97 11/17/2016   CHOLHDL 3.8 11/17/2016   :  Lab Results  Component Value Date   HGBA1C 5.4 11/17/2016   Last GFR Lab Results  Component Value Date   GFRNONAA 74 11/17/2016     Lab Results  Component Value Date   GFRAA 85 11/17/2016   Patient is on Vitamin D supplement.   Lab Results  Component Value Date   VD25OH 70 11/17/2016     She has been doing well with her carbidopa levodopa.  She reports that she has had multiple falls.  She reports that she has not had any injuries but she does fall.  She is doing PT.  She does tend to use a cane at home.  She also has some walkers.  She uses a bedside commode.  She reports that it is working well for her.       Medication Review: Current Outpatient Prescriptions on File Prior to Visit  Medication Sig Dispense  Refill  . carbidopa-levodopa (SINEMET IR) 25-100 MG tablet 3 tablets at 5:00 am, 2 tablets at 9:00 am, 1 tablet at 1:00 pm, 2 tablets at 5:00 pm and one at night, extra PRN 900 tablet 1  . cholecalciferol (VITAMIN D) 1000 UNITS tablet Take 5,000 Units by mouth daily.    [name] ibuprofen (ADVIL,MOTRIN) 200 MG tablet Take 200 mg by mouth every 6 (six) hours as needed. Reported on  12/24/2015    . pramipexole (MIRAPEX) 1 MG tablet TAKE 1 TABLET BY MOUTH 3 TIMES DAILY 270 tablet 1  . ranitidine (ZANTAC) 150 MG tablet Take 150 mg by mouth daily as needed. For acid reflex    . vitamin B-12 (CYANOCOBALAMIN) 1000 MCG tablet Take 1,000 mcg by mouth daily.     No current facility-administered medications on file prior to visit.     Allergies: Allergies  Allergen Reactions  . Macrobid [Nitrofurantoin Macrocrystal] Swelling and Rash  . Baclofen Other (See Comments)    Kept her awake, affected memory  . Gabapentin Other (See Comments)    Weak feeling; made her feel " loopy".   . Cephalosporins Other (See Comments)    Cannot sleep  . Codeine Nausea Only    dizziness  . Darvocet [Propoxyphene N-Acetaminophen] Nausea And Vomiting  . Levaquin [Levofloxacin] Hives  . Penicillins Rash  . Selegiline Hcl Rash  . Tramadol Nausea And Vomiting  . Triamcinolone Itching    Current Problems (verified) has Parkinson's disease ([place]); Sleep disorder; RBD (REM behavioral disorder); Hyperlipidemia; Hypertension; GERD (gastroesophageal reflux disease); Prediabetes; Vitamin D deficiency; Medication management; Piriformis syndrome of left side; SI (sacroiliac) joint dysfunction; Greater trochanteric bursitis of left hip; Greater trochanteric bursitis of right hip; Lumbar radiculopathy; Lumbar stenosis with neurogenic claudication; and Postprocedural pseudomeningocele on her problem list.  Screening Tests Immunization History  Administered Date(s) Administered  . Influenza, High Dose Seasonal PF 09/08/2014, 10/04/2015  . Influenza-Unspecified 09/29/2016  . Pneumococcal Conjugate-13 11/10/2015  . Pneumococcal Polysaccharide-23 02/26/2017  . Pneumococcal-Unspecified 11/27/2006  . Td 09/08/2013  . Zoster 06/27/2006    Preventative care: Last colonoscopy: 2008 DEXA: 2014 Mammogram: 2016, Dr. Ulanda Edison   Names of Other Physician/Practitioners you currently use: 1. [facility] here for primary care 2. Dr. Sabra Heck, eye doctor, last visit 2017 3. Dr. Kathryne Sharper, dentist, last visit 2018 Patient Care Team: Unk Pinto, MD as PCP - General (Internal Medicine) Teena Irani, MD as Consulting Physician (Gastroenterology) Troy Sine, MD as Consulting Physician (Cardiology) Lyndal Pulley, DO as Attending Physician (Family Medicine) Ludwig Clarks, DO as Consulting Physician (Neurology) Marybelle Killings, MD as Consulting Physician (Orthopedic Surgery)  Surgical: She  has a past surgical history that includes Abdominal hysterectomy; Hernia repair; Rotator cuff repair (Left, 10-07-12); Bunionectomy (Bilateral, 11-11-3); Cardiac catheterization (Bilateral, 2013); left heart catheterization with coronary angiogram (N/A, 07/03/2012); Appendectomy; Colonoscopy; Back surgery (12/20/2015); and Repair of cerebrospinal fluid leak (N/A, 12/24/2015). Family Her family history includes CVA in her brother; Esophageal cancer in her mother; Heart attack in her mother. Social history  She reports that she has never smoked. She has never used smokeless tobacco. She reports that she does not drink alcohol or use drugs.  MEDICARE WELLNESS OBJECTIVES: Physical activity: Current Exercise Habits: The patient does not participate in regular exercise at present, Exercise limited by: neurologic condition(s) Cardiac risk factors: Cardiac Risk Factors include: advanced age (>40men, >76 women);hypertension;sedentary lifestyle Depression/mood screen:   Depression screen [facility] 2/9 02/26/2017  Decreased Interest 0  Down, Depressed,  Hopeless 0  PHQ - 2 Score 0    ADLs:  In your present state of health, do you have any difficulty performing the following activities: 02/26/2017 11/17/2016  Hearing? N N  Vision? N N  Difficulty concentrating or making decisions? N N  Walking or climbing stairs? N N  Dressing or bathing? N N  Doing errands, shopping? N N  Preparing Food and eating ?  N -  Using the Toilet? N -  In the past six months, have you accidently leaked urine? N -  Do you have problems with loss of bowel control? N -  Managing your Medications? N -  Managing your Finances? N -  Housekeeping or managing your Housekeeping? N -  Some recent data might be hidden     Cognitive Testing  Alert? Yes  Normal Appearance?Yes  Oriented to person? Yes  Place? Yes   Time? Yes  Recall of three objects?  Yes  Can perform simple calculations? Yes  Displays appropriate judgment?Yes  Can read the correct time from a watch face?Yes  EOL planning: Does Patient Have a Medical Advance Directive?: Yes Type of Advance Directive: Healthcare Power of Attorney, Living will Copy of [place] in Chart?: Yes   Objective:   Today's Vitals   02/26/17 1003  BP: 122/60  Pulse: 78  Resp: 16  Temp: 98 F (36.7 C)  TempSrc: Temporal  Weight: 138 lb (62.6 kg)  Height: 5' (1.524 m)   Body mass index is 26.95 kg/m.  General appearance: alert, no distress, WD/WN, female HEENT: normocephalic, sclerae anicteric, TMs pearly, nares patent, no discharge or erythema, pharynx normal Oral cavity: MMM, no lesions Neck: supple, no lymphadenopathy, no thyromegaly, no masses Heart: RRR, normal S1, S2, no murmurs Lungs: CTA bilaterally, no wheezes, rhonchi, or rales Abdomen: +bs, soft, non tender, non distended, no masses, no hepatomegaly, no splenomegaly Musculoskeletal: nontender, no swelling, no obvious deformity Extremities: no edema, no cyanosis, no clubbing Pulses: 2+ symmetric, upper and lower extremities, normal cap refill Neurological: alert, oriented x 3, CN2-12 intact, strength normal upper extremities and lower extremities, sensation normal throughout, DTRs 2+ throughout, no cerebellar signs, gait normal Psychiatric: normal affect, behavior normal, pleasant   Medicare Attestation I have personally reviewed: The patient's medical and social history Their  use of alcohol, tobacco or illicit drugs Their current medications and supplements The patient's functional ability including ADLs,fall risks, home safety risks, cognitive, and hearing and visual impairment Diet and physical activities Evidence for depression or mood disorders  The patient's weight, height, BMI, and visual acuity have been recorded in the chart.  I have made referrals, counseling, and provided education to the patient based on review of the above and I have provided the patient with a written personalized care plan for preventive services.     Starlyn Skeans, PA-C   02/26/2017

## 2017-03-01 ENCOUNTER — Ambulatory Visit (INDEPENDENT_AMBULATORY_CARE_PROVIDER_SITE_OTHER): Payer: Medicare Other

## 2017-03-01 ENCOUNTER — Encounter: Payer: Self-pay | Admitting: Sports Medicine

## 2017-03-01 ENCOUNTER — Ambulatory Visit (INDEPENDENT_AMBULATORY_CARE_PROVIDER_SITE_OTHER): Payer: Medicare Other | Admitting: Sports Medicine

## 2017-03-01 VITALS — BP 134/57 | HR 67 | Resp 18

## 2017-03-01 DIAGNOSIS — M778 Other enthesopathies, not elsewhere classified: Secondary | ICD-10-CM

## 2017-03-01 DIAGNOSIS — G5782 Other specified mononeuropathies of left lower limb: Secondary | ICD-10-CM

## 2017-03-01 DIAGNOSIS — M779 Enthesopathy, unspecified: Principal | ICD-10-CM

## 2017-03-01 DIAGNOSIS — G5762 Lesion of plantar nerve, left lower limb: Secondary | ICD-10-CM

## 2017-03-01 DIAGNOSIS — M79672 Pain in left foot: Secondary | ICD-10-CM | POA: Diagnosis not present

## 2017-03-01 DIAGNOSIS — M7752 Other enthesopathy of left foot: Secondary | ICD-10-CM | POA: Diagnosis not present

## 2017-03-01 MED ORDER — TRIAMCINOLONE ACETONIDE 10 MG/ML IJ SUSP: 10.0000 mg | Freq: Once | INTRAMUSCULAR | Status: DC

## 2017-03-01 NOTE — Progress Notes (Signed)
Subjective: Alexandra Lindsey is a 76 y.o. female patient who presents to office for evaluation of left foot pain. Patient complains of progressive pain especially over the last few days in the left foot between the second and third toes and especially at ball. Admits to a history of a neuroma that was removed by Dr. Paulla Dolly, and a bunion surgery that was done 10 years ago on the left foot. States that pain is of burning and stinging sensation that her toes have been swollen. Denies any trauma, injury, trip, sprain or fall.  Patient Active Problem List   Diagnosis Date Noted  . 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 Lumbar radiculopathy 09/13/2015  . Greater trochanteric bursitis of right hip 08/04/2015  . Greater trochanteric bursitis of left hip 02/17/2015  . Piriformis syndrome of left side 11/05/2014  . SI (sacroiliac) joint dysfunction 11/05/2014  . Vitamin D deficiency 09/06/2014  . Medication management 09/06/2014  . Hypertension   . GERD (gastroesophageal reflux disease)   . Prediabetes   . 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 Parkinson's disease ([place])   . 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 Hyperlipidemia 10/07/2012    Current Outpatient Prescriptions on File Prior to Visit  Medication Sig Dispense Refill  . carbidopa-levodopa (SINEMET IR) 25-100 MG tablet 3 tablets at 5:00 am, 2 tablets at 9:00 am, 1 tablet at 1:00 pm, 2 tablets at 5:00 pm and one at night, extra PRN 900 tablet 1  . cholecalciferol (VITAMIN D) 1000 UNITS tablet Take 5,000 Units by mouth daily.    [name] ibuprofen (ADVIL,MOTRIN) 200 MG tablet Take 200 mg by mouth every 6 (six) hours as needed. Reported on 12/24/2015    . pramipexole (MIRAPEX) 1 MG tablet TAKE 1 TABLET BY MOUTH 3 TIMES DAILY 270 tablet 1  . ranitidine (ZANTAC) 150 MG tablet Take 150 mg by mouth daily as needed. For acid reflex    . vitamin B-12 (CYANOCOBALAMIN) 1000 MCG tablet Take 1,000  mcg by mouth daily.     No current facility-administered medications on file prior to visit.     Allergies  Allergen Reactions  . Macrobid [Nitrofurantoin Macrocrystal] Swelling and Rash  . Baclofen Other (See Comments)    Kept her awake, affected memory  . Gabapentin Other (See Comments)    Weak feeling; made her feel " loopy".   . Cephalosporins Other (See Comments)    Cannot sleep  . Codeine Nausea Only    dizziness  . Darvocet [Propoxyphene N-Acetaminophen] Nausea And Vomiting  . Levaquin [Levofloxacin] Hives  . Penicillins Rash  . Selegiline Hcl Rash  . Tramadol Nausea And Vomiting  . Triamcinolone Itching    Objective:  General: Alert and oriented x3 in no acute distress  Dermatology: Old surgical scars well-healed No open lesions bilateral lower extremities, no webspace macerations, no ecchymosis bilateral, all nails x 10 are well manicured.  Vascular: Dorsalis Pedis and Posterior Tibial pedal pulses palpable, Capillary Fill Time 3 seconds,(+) scant pedal hair growth bilateral, focal edema , left foot at second and third toes, Temperature gradient within normal limits.  Neurology: Gross sensation intact via light touch bilateral. (- )Tinels sign bilateral.   Musculoskeletal: Mild tenderness with palpation at base of the second toe and medial webspace. Corresponding between the second and third toes, likely consistent with capsulitis with hammertoe deformity on left. Strength within normal limits in all groups bilateral.   Gait: Antalgic gait  Xrays  Left Foot  Impression: Normal osseous mineralization, hardware intact first metatarsal. There is mild joint space narrowing at the intermetatarsal space with dorsal dislocation of the second toe and hammertoe contracture. No fracture, soft tissue swelling is mild, over the second and third toes. No other acute findings.  Assessment and Plan: Problem List Items Addressed This Visit    None    Visit Diagnoses     Capsulitis of foot, left    -  Primary   Relevant Medications   triamcinolone acetonide (KENALOG) 10 MG/ML injection 10 mg   Other Relevant Orders   DG Foot Complete Left   Neuroma digital nerve, left       Relevant Medications   triamcinolone acetonide (KENALOG) 10 MG/ML injection 10 mg   Left foot pain       Relevant Medications   triamcinolone acetonide (KENALOG) 10 MG/ML injection 10 mg       -Complete examination performed -Xrays reviewed -Discussed treatement options for likely capsulitis with hammertoe versus recurrent neuroma -After oral consent and aseptic prep, injected a mixture containing 1 ml of 2%  plain lidocaine, 1 ml 0.5% plain marcaine, 0.5 ml of kenalog 10 and 0.5 ml of dexamethasone phosphate into second metatarsophalangeal joint at point of maximal tenderness on left foot without complication. Post-injection care discussed with patient.  -Applied metatarsal strapping to left foot and Instructed on use Recommend good supportive shoes. Protection, rest, ice, elevation as needed- -Patient to return to office as needed or sooner if condition worsens.  Landis Martins, DPM

## 2017-03-01 NOTE — Progress Notes (Signed)
   Subjective:    Patient ID: Alexandra Lindsey, female    DOB: Jul 12, 1941, 76 y.o.   MRN: [id number]  HPI   I have some pain and burning in my 2nd and 3rd toes on my left foot and swollen some and stings and started last Monday and has a pinching to it   Review of Systems  All other systems reviewed and are negative.      Objective:   Physical Exam        Assessment & Plan:

## 2017-03-07 ENCOUNTER — Telehealth: Payer: Self-pay | Admitting: *Deleted

## 2017-03-07 ENCOUNTER — Ambulatory Visit (INDEPENDENT_AMBULATORY_CARE_PROVIDER_SITE_OTHER): Payer: Medicare Other | Admitting: Sports Medicine

## 2017-03-07 ENCOUNTER — Encounter: Payer: Self-pay | Admitting: Sports Medicine

## 2017-03-07 DIAGNOSIS — M779 Enthesopathy, unspecified: Principal | ICD-10-CM

## 2017-03-07 DIAGNOSIS — M778 Other enthesopathies, not elsewhere classified: Secondary | ICD-10-CM

## 2017-03-07 DIAGNOSIS — G5762 Lesion of plantar nerve, left lower limb: Secondary | ICD-10-CM

## 2017-03-07 DIAGNOSIS — M7752 Other enthesopathy of left foot: Secondary | ICD-10-CM | POA: Diagnosis not present

## 2017-03-07 DIAGNOSIS — M2042 Other hammer toe(s) (acquired), left foot: Secondary | ICD-10-CM | POA: Diagnosis not present

## 2017-03-07 DIAGNOSIS — D3610 Benign neoplasm of peripheral nerves and autonomic nervous system, unspecified: Secondary | ICD-10-CM

## 2017-03-07 DIAGNOSIS — M79672 Pain in left foot: Secondary | ICD-10-CM | POA: Diagnosis not present

## 2017-03-07 DIAGNOSIS — G5782 Other specified mononeuropathies of left lower limb: Secondary | ICD-10-CM

## 2017-03-07 NOTE — Telephone Encounter (Addendum)
-----   Message from Rodri­guez Hevia, [place] sent at 03/07/2017 11:18 AM EDT ----- Regarding: [facility] Ultrasound left foot Evaluate 2nd toe joint capsule and interspace r/o capsulitis, tendon tear, vs neuroma -Dr. Chauncey Cruel. Scheduled pt for 03/13/2017 at 2:30pm arrive 2:00pm. Informed pt, she states she has another appt in [place] that day and will need to change, I gave her [phone] to make her appt.

## 2017-03-07 NOTE — Progress Notes (Signed)
Subjective: Alexandra Lindsey is a 76 y.o. female patient who returns to office for evaluation of left foot pain. Patient states that the injection given 6 days ago did not help, still swollen and painful. Patient is also wondering if her PT for Parkinson is worsening her foot since she has to bear weight on the ball of her foot barefoot and do exercises. No other issues noted.   Patient Active Problem List   Diagnosis Date Noted  . 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 Lumbar radiculopathy 09/13/2015  . Greater trochanteric bursitis of right hip 08/04/2015  . Greater trochanteric bursitis of left hip 02/17/2015  . Piriformis syndrome of left side 11/05/2014  . SI (sacroiliac) joint dysfunction 11/05/2014  . Vitamin D deficiency 09/06/2014  . Medication management 09/06/2014  . Hypertension   . GERD (gastroesophageal reflux disease)   . Prediabetes   . 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 Parkinson's disease ([place])   . 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 Hyperlipidemia 10/07/2012    Current Outpatient Prescriptions on File Prior to Visit  Medication Sig Dispense Refill  . carbidopa-levodopa (SINEMET IR) 25-100 MG tablet 3 tablets at 5:00 am, 2 tablets at 9:00 am, 1 tablet at 1:00 pm, 2 tablets at 5:00 pm and one at night, extra PRN 900 tablet 1  . cholecalciferol (VITAMIN D) 1000 UNITS tablet Take 5,000 Units by mouth daily.    [name] ibuprofen (ADVIL,MOTRIN) 200 MG tablet Take 200 mg by mouth every 6 (six) hours as needed. Reported on 12/24/2015    . pramipexole (MIRAPEX) 1 MG tablet TAKE 1 TABLET BY MOUTH 3 TIMES DAILY 270 tablet 1  . ranitidine (ZANTAC) 150 MG tablet Take 150 mg by mouth daily as needed. For acid reflex    . vitamin B-12 (CYANOCOBALAMIN) 1000 MCG tablet Take 1,000 mcg by mouth daily.     Current Facility-Administered Medications on File Prior to Visit  Medication Dose Route Frequency Provider Last Rate  Last Dose  . triamcinolone acetonide (KENALOG) 10 MG/ML injection 10 mg  10 mg Other Once Landis Martins, DPM        Allergies  Allergen Reactions  . Macrobid [Nitrofurantoin Macrocrystal] Swelling and Rash  . Baclofen Other (See Comments)    Kept her awake, affected memory  . Gabapentin Other (See Comments)    Weak feeling; made her feel " loopy".   . Cephalosporins Other (See Comments)    Cannot sleep  . Codeine Nausea Only    dizziness  . Darvocet [Propoxyphene N-Acetaminophen] Nausea And Vomiting  . Levaquin [Levofloxacin] Hives  . Penicillins Rash  . Selegiline Hcl Rash  . Tramadol Nausea And Vomiting  . Triamcinolone Itching    Objective:  General: Alert and oriented x3 in no acute distress  Dermatology: Old surgical scars well-healed No open lesions bilateral lower extremities, no webspace macerations, no ecchymosis bilateral, all nails x 10 are well manicured.  Vascular: Dorsalis Pedis and Posterior Tibial pedal pulses palpable, Capillary Fill Time 3 seconds,(+) scant pedal hair growth bilateral, focal edema , left foot at second and third toes, Temperature gradient within normal limits.  Neurology: Johney [place] sensation intact via light touch bilateral. (- )Tinels sign bilateral.   Musculoskeletal: Mild tenderness with palpation at base of the second toe and  webspace Corresponding between the second and third toes, likely consistent with capsulitis with hammertoe deformity on left. Strength within normal limits in all groups bilateral.   Assessment and Plan: Problem List  Items Addressed This Visit    None    Visit Diagnoses    Capsulitis of foot, left    -  Primary   Neuroma digital nerve, left       Hammertoe of left foot       Left foot pain           -Complete examination performed -Previous Xrays reviewed -Discussed treatement options for likely capsulitis with hammertoe versus recurrent neuroma -Rx Ultrasound for evaluation of capsule, tendon, and r/o neuroma  left 2nd MTPJ -Continue with Motrin, rest, icing and good supportive shoes -Recommend refrain from loading ball of foot with PT  -Patient to return to office after ultrasound or sooner if condition worsens.  Landis Martins, DPM

## 2017-03-08 ENCOUNTER — Ambulatory Visit: Payer: Medicare Other | Admitting: Sports Medicine

## 2017-03-13 DIAGNOSIS — G5762 Lesion of plantar nerve, left lower limb: Secondary | ICD-10-CM | POA: Diagnosis not present

## 2017-03-13 DIAGNOSIS — M4726 Other spondylosis with radiculopathy, lumbar region: Secondary | ICD-10-CM | POA: Diagnosis not present

## 2017-03-13 DIAGNOSIS — M5136 Other intervertebral disc degeneration, lumbar region: Secondary | ICD-10-CM | POA: Diagnosis not present

## 2017-03-13 DIAGNOSIS — M79672 Pain in left foot: Secondary | ICD-10-CM | POA: Diagnosis not present

## 2017-03-13 DIAGNOSIS — Z981 Arthrodesis status: Secondary | ICD-10-CM | POA: Diagnosis not present

## 2017-03-13 DIAGNOSIS — M545 Low back pain: Secondary | ICD-10-CM | POA: Diagnosis not present

## 2017-03-16 ENCOUNTER — Ambulatory Visit (INDEPENDENT_AMBULATORY_CARE_PROVIDER_SITE_OTHER): Payer: Medicare Other | Admitting: Sports Medicine

## 2017-03-16 ENCOUNTER — Encounter: Payer: Self-pay | Admitting: Sports Medicine

## 2017-03-16 DIAGNOSIS — M79672 Pain in left foot: Secondary | ICD-10-CM

## 2017-03-16 DIAGNOSIS — M779 Enthesopathy, unspecified: Secondary | ICD-10-CM | POA: Diagnosis not present

## 2017-03-16 DIAGNOSIS — M2042 Other hammer toe(s) (acquired), left foot: Secondary | ICD-10-CM | POA: Diagnosis not present

## 2017-03-16 DIAGNOSIS — D3610 Benign neoplasm of peripheral nerves and autonomic nervous system, unspecified: Secondary | ICD-10-CM | POA: Diagnosis not present

## 2017-03-16 NOTE — Patient Instructions (Signed)
Pre-Operative Instructions  Congratulations, you have decided to take an important step to improving your quality of life.  You can be assured that the doctors of [facility] will be with you every step of the way.  1. Plan to be at the surgery center/hospital at least 1 (one) hour prior to your scheduled time unless otherwise directed by the surgical center/hospital staff.  You must have a responsible adult accompany you, remain during the surgery and drive you home.  Make sure you have directions to the surgical center/hospital and know how to get there on time. 2. For hospital based surgery you will need to obtain a history and physical form from your family physician within 1 month prior to the date of surgery- we will give you a form for you primary physician.  3. We make every effort to accommodate the date you request for surgery.  There are however, times where surgery dates or times have to be moved.  We will contact you as soon as possible if a change in schedule is required.   4. No Aspirin/Ibuprofen for one week before surgery.  If you are on aspirin, any non-steroidal anti-inflammatory medications (Mobic, Aleve, Ibuprofen) you should stop taking it 7 days prior to your surgery.  You make take Tylenol  For pain prior to surgery.  5. Medications- If you are taking daily heart and blood pressure medications, seizure, reflux, allergy, asthma, anxiety, pain or diabetes medications, make sure the surgery center/hospital is aware before the day of surgery so they may notify you which medications to take or avoid the day of surgery. 6. No food or drink after midnight the night before surgery unless directed otherwise by surgical center/hospital staff. 7. No alcoholic beverages 24 hours prior to surgery.  No smoking 24 hours prior to or 24 hours after surgery. 8. Wear loose pants or shorts- loose enough to fit over bandages, boots, and casts. 9. No slip on shoes, sneakers are best. 10. Bring  your boot with you to the surgery center/hospital.  Also bring crutches or a walker if your physician has prescribed it for you.  If you do not have this equipment, it will be provided for you after surgery. 11. If you have not been contracted by the surgery center/hospital by the day before your surgery, call to confirm the date and time of your surgery. 12. Leave-time from work may vary depending on the type of surgery you have.  Appropriate arrangements should be made prior to surgery with your employer. 13. Prescriptions will be provided immediately following surgery by your doctor.  Have these filled as soon as possible after surgery and take the medication as directed. 14. Remove nail polish on the operative foot. 15. Wash the night before surgery.  The night before surgery wash the foot and leg well with the antibacterial soap provided and water paying special attention to beneath the toenails and in between the toes.  Rinse thoroughly with water and dry well with a towel.  Perform this wash unless told not to do so by your physician.  Enclosed: 1 Ice pack (please put in freezer the night before surgery)   1 Hibiclens skin cleaner   Pre-op Instructions  If you have any questions regarding the instructions, do not hesitate to call our office.  [place]: [address] [place], [place] [address] [phone]  [place]: [address] [place], [place] [address] [phone]  [place]: [address]  [place],  [address] [phone]   Dr.   Norman Regal DPM, Dr. Matthew Wagoner DPM, Dr. M. Todd Hyatt DPM, Dr. Costella Schwarz DPM 

## 2017-03-16 NOTE — Progress Notes (Signed)
Subjective: Alexandra Lindsey is a 76 y.o. female patient who returns to office for evaluation of left foot pain. Patient is here for ultrasound results. No other issues noted.   Patient Active Problem List   Diagnosis Date Noted  . 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 Lumbar radiculopathy 09/13/2015  . Greater trochanteric bursitis of right hip 08/04/2015  . Greater trochanteric bursitis of left hip 02/17/2015  . Piriformis syndrome of left side 11/05/2014  . SI (sacroiliac) joint dysfunction 11/05/2014  . Vitamin D deficiency 09/06/2014  . Medication management 09/06/2014  . Hypertension   . GERD (gastroesophageal reflux disease)   . Prediabetes   . 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 Parkinson's disease ([place])   . 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 Hyperlipidemia 10/07/2012    Current Outpatient Prescriptions on File Prior to Visit  Medication Sig Dispense Refill  . carbidopa-levodopa (SINEMET IR) 25-100 MG tablet 3 tablets at 5:00 am, 2 tablets at 9:00 am, 1 tablet at 1:00 pm, 2 tablets at 5:00 pm and one at night, extra PRN 900 tablet 1  . cholecalciferol (VITAMIN D) 1000 UNITS tablet Take 5,000 Units by mouth daily.    [name] ibuprofen (ADVIL,MOTRIN) 200 MG tablet Take 200 mg by mouth every 6 (six) hours as needed. Reported on 12/24/2015    . pramipexole (MIRAPEX) 1 MG tablet TAKE 1 TABLET BY MOUTH 3 TIMES DAILY 270 tablet 1  . ranitidine (ZANTAC) 150 MG tablet Take 150 mg by mouth daily as needed. For acid reflex    . vitamin B-12 (CYANOCOBALAMIN) 1000 MCG tablet Take 1,000 mcg by mouth daily.     Current Facility-Administered Medications on File Prior to Visit  Medication Dose Route Frequency Provider Last Rate Last Dose  . triamcinolone acetonide (KENALOG) 10 MG/ML injection 10 mg  10 mg Other Once Landis Martins, DPM        Allergies  Allergen Reactions  . Macrobid [Nitrofurantoin Macrocrystal] Swelling  and Rash  . Baclofen Other (See Comments)    Kept her awake, affected memory  . Gabapentin Other (See Comments)    Weak feeling; made her feel " loopy".   . Cephalosporins Other (See Comments)    Cannot sleep  . Codeine Nausea Only    dizziness  . Darvocet [Propoxyphene N-Acetaminophen] Nausea And Vomiting  . Levaquin [Levofloxacin] Hives  . Penicillins Rash  . Selegiline Hcl Rash  . Tramadol Nausea And Vomiting  . Triamcinolone Itching   Family History  Problem Relation Age of Onset  . Heart attack Mother   . Esophageal cancer Mother   . CVA Brother    Social History   Social History  . Marital status: Married    Spouse name: N/A  . Number of children: N/A  . Years of education: N/A   Occupational History  . retired     Chiropractor   Social History Main Topics  . Smoking status: Never Smoker  . Smokeless tobacco: Never Used  . Alcohol use No  . Drug use: No  . Sexual activity: Not on file   Other Topics Concern  . Not on file   Social History Narrative  . No narrative on file   Past Surgical History:  Procedure Laterality Date  . ABDOMINAL HYSTERECTOMY    . APPENDECTOMY    . BACK SURGERY  12/20/2015  . BUNIONECTOMY Bilateral 11-11-3  . CARDIAC CATHETERIZATION Bilateral 2013  . COLONOSCOPY    . HERNIA REPAIR    .  LEFT HEART CATHETERIZATION WITH CORONARY ANGIOGRAM N/A 07/03/2012   Procedure: LEFT HEART CATHETERIZATION WITH CORONARY ANGIOGRAM;  Surgeon: Troy Sine, MD;  Location: [facility] CATH LAB;  Service: Cardiovascular;  Laterality: N/A;  . REPAIR OF CEREBROSPINAL FLUID LEAK N/A 12/24/2015   Procedure: LUMBAR WOUND EXPLORATION, REPAIR OF CEREBROSPINAL FLUID LEAK, PLACEMENT OF LUMBAR DRAIN;  Surgeon: Kevan Ny Ditty, MD;  Location: [place] NEURO ORS;  Service: Neurosurgery;  Laterality: N/A;  . ROTATOR CUFF REPAIR Left 10-07-12   Objective:  General: Alert and oriented x3 in no acute distress  Dermatology: Old surgical scars well-healed No open lesions  bilateral lower extremities, no webspace macerations, no ecchymosis bilateral, all nails x 10 are well manicured.  Vascular: Dorsalis Pedis and Posterior Tibial pedal pulses palpable, Capillary Fill Time 3 seconds,(+) scant pedal hair growth bilateral, focal edema , left foot at second and third toes, Temperature gradient within normal limits.  Neurology: Johney Maine sensation intact via light touch bilateral. (- )Tinels sign bilateral.   Musculoskeletal: Mild tenderness with palpation at base of the second toe and  webspace Corresponding between the second and third toes, likely consistent with capsulitis with hammertoe deformity on left. Strength within normal limits in all groups bilateral.   [place] + Neuroma at 2nd interspace  Assessment and Plan: Problem List Items Addressed This Visit    None    Visit Diagnoses    Capsulitis    -  Primary   Neuroma       Hammertoe of left foot       Left foot pain           -Complete examination performed -Previous Xrays reviewed -Discussed treatement options for likely capsulitis with hammertoe versus recurrent neuroma -Patient opt for surgical management. Consent obtained for Left excision of neuroma, 2nd MTPJ capsulotomy with hammertoe repair and possible kwire at 2nd toe. Pre and Post op course explained. Risks, benefits, alternatives explained. No guarantees given or implied. Surgical booking slip submitted and provided patient with Surgical packet and info for [facility] surgical center -Dispensed surgical shoe to use post op  -Patient to return to office after surgery or sooner if condition worsens.  Landis Martins, DPM

## 2017-03-19 ENCOUNTER — Other Ambulatory Visit: Payer: Self-pay | Admitting: Internal Medicine

## 2017-03-19 DIAGNOSIS — Z1211 Encounter for screening for malignant neoplasm of colon: Secondary | ICD-10-CM | POA: Diagnosis not present

## 2017-03-19 DIAGNOSIS — Z1212 Encounter for screening for malignant neoplasm of rectum: Secondary | ICD-10-CM | POA: Diagnosis not present

## 2017-03-22 ENCOUNTER — Telehealth: Payer: Self-pay | Admitting: *Deleted

## 2017-03-22 NOTE — Telephone Encounter (Signed)
"  I'm a patient of Dr. Cannon Kettle.  She asked me to call you to set up my surgery.  I have already gotten my history and physical form completed it was done on 03/20/2017."  Do you have a date in mind?  "I'd like to do it as soon as possible."  She can do it on Apr 02, 2017.  "That date will be fine.  I will drop off my paperwork to your office in [place] or [place].  Do you know what time I will need to be there?"  Someone from the surgical center will call you with the arrival time a day or two prior to surgery date

## 2017-03-24 LAB — COLOGUARD

## 2017-03-26 ENCOUNTER — Telehealth: Payer: Self-pay | Admitting: Internal Medicine

## 2017-03-26 ENCOUNTER — Telehealth: Payer: Self-pay | Admitting: *Deleted

## 2017-03-26 ENCOUNTER — Encounter: Payer: Self-pay | Admitting: Physician Assistant

## 2017-03-26 ENCOUNTER — Other Ambulatory Visit: Payer: Self-pay | Admitting: Physician Assistant

## 2017-03-26 DIAGNOSIS — R195 Other fecal abnormalities: Secondary | ICD-10-CM | POA: Insufficient documentation

## 2017-03-26 NOTE — Telephone Encounter (Signed)
"  I talked to you last Friday.  I'd like to cancel that surgery.  My foot is doing much better.  I would like to try and hold off a while.  Give me a call if you need me."

## 2017-03-26 NOTE — Telephone Encounter (Signed)
Patient requests a 2nd Cologuard Test, before going to [place]. She states that her previous hemacult cards have had a false positve, 2nd hemacult would be negative. Please advise.

## 2017-03-27 NOTE — Telephone Encounter (Signed)
Insurance will not cover another cologuard, suggest an appointment with GI. Continue referral. They will discuss options with you.

## 2017-03-27 NOTE — Telephone Encounter (Signed)
LVM w/husband to return office call.

## 2017-03-28 ENCOUNTER — Encounter: Payer: Self-pay | Admitting: Internal Medicine

## 2017-04-04 ENCOUNTER — Ambulatory Visit (INDEPENDENT_AMBULATORY_CARE_PROVIDER_SITE_OTHER): Payer: Medicare Other | Admitting: Sports Medicine

## 2017-04-04 ENCOUNTER — Encounter: Payer: Self-pay | Admitting: Sports Medicine

## 2017-04-04 DIAGNOSIS — M2042 Other hammer toe(s) (acquired), left foot: Secondary | ICD-10-CM | POA: Diagnosis not present

## 2017-04-04 DIAGNOSIS — M779 Enthesopathy, unspecified: Secondary | ICD-10-CM

## 2017-04-04 DIAGNOSIS — D3610 Benign neoplasm of peripheral nerves and autonomic nervous system, unspecified: Secondary | ICD-10-CM

## 2017-04-04 DIAGNOSIS — M79672 Pain in left foot: Secondary | ICD-10-CM

## 2017-04-04 NOTE — Progress Notes (Signed)
Subjective: Alexandra Lindsey is a 76 y.o. female patient who returns to office for evaluation of left foot pain. Patient reports that her foot was doing better however still has some pain and would like to try injections at this time instead of surgery. No other issues noted.   Patient Active Problem List   Diagnosis Date Noted  . Positive colorectal cancer screening using Cologuard test 03/26/2017  . 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 Lumbar radiculopathy 09/13/2015  . Greater trochanteric bursitis of right hip 08/04/2015  . Greater trochanteric bursitis of left hip 02/17/2015  . Piriformis syndrome of left side 11/05/2014  . SI (sacroiliac) joint dysfunction 11/05/2014  . Vitamin D deficiency 09/06/2014  . Medication management 09/06/2014  . Hypertension   . GERD (gastroesophageal reflux disease)   . Prediabetes   . 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 Parkinson's disease ([place])   . 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 Hyperlipidemia 10/07/2012    Current Outpatient Prescriptions on File Prior to Visit  Medication Sig Dispense Refill  . carbidopa-levodopa (SINEMET IR) 25-100 MG tablet 3 tablets at 5:00 am, 2 tablets at 9:00 am, 1 tablet at 1:00 pm, 2 tablets at 5:00 pm and one at night, extra PRN 900 tablet 1  . cholecalciferol (VITAMIN D) 1000 UNITS tablet Take 5,000 Units by mouth daily.    [name] ibuprofen (ADVIL,MOTRIN) 200 MG tablet Take 200 mg by mouth every 6 (six) hours as needed. Reported on 12/24/2015    . pramipexole (MIRAPEX) 1 MG tablet TAKE 1 TABLET BY MOUTH 3 TIMES DAILY 270 tablet 1  . ranitidine (ZANTAC) 150 MG tablet Take 150 mg by mouth daily as needed. For acid reflex    . vitamin B-12 (CYANOCOBALAMIN) 1000 MCG tablet Take 1,000 mcg by mouth daily.     Current Facility-Administered Medications on File Prior to Visit  Medication Dose Route Frequency Provider Last Rate Last Dose  . triamcinolone  acetonide (KENALOG) 10 MG/ML injection 10 mg  10 mg Other Once Landis Martins, DPM        Allergies  Allergen Reactions  . Macrobid [Nitrofurantoin Macrocrystal] Swelling and Rash  . Baclofen Other (See Comments)    Kept her awake, affected memory  . Gabapentin Other (See Comments)    Weak feeling; made her feel " loopy".   . Cephalosporins Other (See Comments)    Cannot sleep  . Codeine Nausea Only    dizziness  . Darvocet [Propoxyphene N-Acetaminophen] Nausea And Vomiting  . Levaquin [Levofloxacin] Hives  . Penicillins Rash  . Selegiline Hcl Rash  . Tramadol Nausea And Vomiting  . Triamcinolone Itching   Family History  Problem Relation Age of Onset  . Heart attack Mother   . Esophageal cancer Mother   . CVA Brother    Social History   Social History  . Marital status: Married    Spouse name: N/A  . Number of children: N/A  . Years of education: N/A   Occupational History  . retired     Chiropractor   Social History Main Topics  . Smoking status: Never Smoker  . Smokeless tobacco: Never Used  . Alcohol use No  . Drug use: No  . Sexual activity: Not Asked   Other Topics Concern  . None   Social History Narrative  . None   Past Surgical History:  Procedure Laterality Date  . ABDOMINAL HYSTERECTOMY    . APPENDECTOMY    . BACK  SURGERY  12/20/2015  . BUNIONECTOMY Bilateral 11-11-3  . CARDIAC CATHETERIZATION Bilateral 2013  . COLONOSCOPY    . HERNIA REPAIR    . LEFT HEART CATHETERIZATION WITH CORONARY ANGIOGRAM N/A 07/03/2012   Procedure: LEFT HEART CATHETERIZATION WITH CORONARY ANGIOGRAM;  Surgeon: Troy Sine, MD;  Location: [facility] CATH LAB;  Service: Cardiovascular;  Laterality: N/A;  . REPAIR OF CEREBROSPINAL FLUID LEAK N/A 12/24/2015   Procedure: LUMBAR WOUND EXPLORATION, REPAIR OF CEREBROSPINAL FLUID LEAK, PLACEMENT OF LUMBAR DRAIN;  Surgeon: Kevan Ny Ditty, MD;  Location: [place] NEURO ORS;  Service: Neurosurgery;  Laterality: N/A;  . ROTATOR CUFF  REPAIR Left 10-07-12   Objective:  General: Alert and oriented x3 in no acute distress  Dermatology: Old surgical scars well-healed No open lesions bilateral lower extremities, no webspace macerations, no ecchymosis bilateral, all nails x 10 are well manicured.  Vascular: Dorsalis Pedis and Posterior Tibial pedal pulses palpable, Capillary Fill Time 3 seconds,(+) scant pedal hair growth bilateral, focal edema , left foot at second and third toes, Temperature gradient within normal limits.  Neurology: Johney Maine sensation intact via light touch bilateral. (- )Tinels sign bilateral.   Musculoskeletal: Mild tenderness with palpation at base of the second toe and  webspace Corresponding between the second and third toes, likely consistent with capsulitis with hammertoe deformity on left. Strength within normal limits in all groups bilateral.   [place] + Neuroma at 2nd interspace  Assessment and Plan: Problem List Items Addressed This Visit    None    Visit Diagnoses    Capsulitis    -  Primary   Neuroma       Hammertoe of left foot       Left foot pain           -Complete examination performed -Previous ultrasound results reviewed -Re-Discussed conservative care for capsulitis with hammertoe versus recurrent neuroma -Patient opt to hold off on surgery at this time and wants to try injection. Patient will keep shoe and ice pack and surgery center information in case she reconsiders surgery in the near future -After oral consent and aseptic prep, injected a mixture containing 1 ml of 2% plain lidocaine, 1 ml 0.5% plain marcaine, 0.5 ml of kenalog 10 and 0.5 ml of dexamethasone phosphate into left 2nd webspace at area of neuroma without complication. Post-injection care discussed with patient.  -Recommend icing, pads, and good supportive shoes -Patient to return to office in 1 month for possible re-injection or sooner if condition worsens.  Landis Martins, DPM

## 2017-04-11 ENCOUNTER — Encounter: Payer: Medicare Other | Admitting: Sports Medicine

## 2017-04-11 DIAGNOSIS — R195 Other fecal abnormalities: Secondary | ICD-10-CM | POA: Diagnosis not present

## 2017-04-18 ENCOUNTER — Encounter: Payer: Medicare Other | Admitting: Sports Medicine

## 2017-04-24 ENCOUNTER — Other Ambulatory Visit: Payer: Self-pay | Admitting: Neurology

## 2017-05-02 ENCOUNTER — Ambulatory Visit (INDEPENDENT_AMBULATORY_CARE_PROVIDER_SITE_OTHER): Payer: Medicare Other | Admitting: Sports Medicine

## 2017-05-02 DIAGNOSIS — M79672 Pain in left foot: Secondary | ICD-10-CM

## 2017-05-02 DIAGNOSIS — D3610 Benign neoplasm of peripheral nerves and autonomic nervous system, unspecified: Secondary | ICD-10-CM

## 2017-05-02 DIAGNOSIS — M2042 Other hammer toe(s) (acquired), left foot: Secondary | ICD-10-CM

## 2017-05-02 DIAGNOSIS — M779 Enthesopathy, unspecified: Secondary | ICD-10-CM

## 2017-05-02 NOTE — Progress Notes (Signed)
Subjective: Alexandra Lindsey is a 76 y.o. female patient who returns to office for evaluation of left foot pain. Patient reports that her pain is better after the injection noticeably. Still has a little bit of fullness, however, but is not painful. No other issues noted.   Patient Active Problem List   Diagnosis Date Noted  . Positive colorectal cancer screening using Cologuard test 03/26/2017  . 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 Lumbar radiculopathy 09/13/2015  . Greater trochanteric bursitis of right hip 08/04/2015  . Greater trochanteric bursitis of left hip 02/17/2015  . Piriformis syndrome of left side 11/05/2014  . SI (sacroiliac) joint dysfunction 11/05/2014  . Vitamin D deficiency 09/06/2014  . Medication management 09/06/2014  . Hypertension   . GERD (gastroesophageal reflux disease)   . Prediabetes   . 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 Parkinson's disease ([place])   . 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 Hyperlipidemia 10/07/2012    Current Outpatient Prescriptions on File Prior to Visit  Medication Sig Dispense Refill  . carbidopa-levodopa (SINEMET IR) 25-100 MG tablet 3 tablets at 5:00 am, 2 tablets at 9:00 am, 1 tablet at 1:00 pm, 2 tablets at 5:00 pm and one at night, extra PRN 900 tablet 1  . cholecalciferol (VITAMIN D) 1000 UNITS tablet Take 5,000 Units by mouth daily.    [name] ibuprofen (ADVIL,MOTRIN) 200 MG tablet Take 200 mg by mouth every 6 (six) hours as needed. Reported on 12/24/2015    . pramipexole (MIRAPEX) 1 MG tablet TAKE 1 TABLET BY MOUTH 3 TIMES DAILY 270 tablet 1  . ranitidine (ZANTAC) 150 MG tablet Take 150 mg by mouth daily as needed. For acid reflex    . vitamin B-12 (CYANOCOBALAMIN) 1000 MCG tablet Take 1,000 mcg by mouth daily.     Current Facility-Administered Medications on File Prior to Visit  Medication Dose Route Frequency Provider Last Rate Last Dose  . triamcinolone  acetonide (KENALOG) 10 MG/ML injection 10 mg  10 mg Other Once Landis Martins, DPM        Allergies  Allergen Reactions  . Macrobid [Nitrofurantoin Macrocrystal] Swelling and Rash  . Baclofen Other (See Comments)    Kept her awake, affected memory  . Gabapentin Other (See Comments)    Weak feeling; made her feel " loopy".   . Cephalosporins Other (See Comments)    Cannot sleep  . Codeine Nausea Only    dizziness  . Darvocet [Propoxyphene N-Acetaminophen] Nausea And Vomiting  . Levaquin [Levofloxacin] Hives  . Penicillins Rash  . Selegiline Hcl Rash  . Tramadol Nausea And Vomiting  . Triamcinolone Itching   Family History  Problem Relation Age of Onset  . Heart attack Mother   . Esophageal cancer Mother   . CVA Brother    Social History   Social History  . Marital status: Married    Spouse name: N/A  . Number of children: N/A  . Years of education: N/A   Occupational History  . retired     Chiropractor   Social History Main Topics  . Smoking status: Never Smoker  . Smokeless tobacco: Never Used  . Alcohol use No  . Drug use: No  . Sexual activity: Not on file   Other Topics Concern  . Not on file   Social History Narrative  . No narrative on file   Past Surgical History:  Procedure Laterality Date  . ABDOMINAL HYSTERECTOMY    . APPENDECTOMY    .  BACK SURGERY  12/20/2015  . BUNIONECTOMY Bilateral 11-11-3  . CARDIAC CATHETERIZATION Bilateral 2013  . COLONOSCOPY    . HERNIA REPAIR    . LEFT HEART CATHETERIZATION WITH CORONARY ANGIOGRAM N/A 07/03/2012   Procedure: LEFT HEART CATHETERIZATION WITH CORONARY ANGIOGRAM;  Surgeon: Troy Sine, MD;  Location: [facility] CATH LAB;  Service: Cardiovascular;  Laterality: N/A;  . REPAIR OF CEREBROSPINAL FLUID LEAK N/A 12/24/2015   Procedure: LUMBAR WOUND EXPLORATION, REPAIR OF CEREBROSPINAL FLUID LEAK, PLACEMENT OF LUMBAR DRAIN;  Surgeon: Kevan Ny Ditty, MD;  Location: [place] NEURO ORS;  Service: Neurosurgery;   Laterality: N/A;  . ROTATOR CUFF REPAIR Left 10-07-12   Objective:  General: Alert and oriented x3 in no acute distress  Dermatology: Old surgical scars well-healed No open lesions bilateral lower extremities, no webspace macerations, no ecchymosis bilateral, all nails x 10 are well manicured.  Vascular: Dorsalis Pedis and Posterior Tibial pedal pulses palpable, Capillary Fill Time 3 seconds,(+) scant pedal hair growth bilateral, decreased focal edema , left foot at second and third toes, Temperature gradient within normal limits.  Neurology: Johney Maine sensation intact via light touch bilateral. (- )Tinels sign bilateral.   Musculoskeletal: No tenderness with palpation at base of the second toe and  webspace Corresponding between the second and third toes, likely consistent with capsulitis with hammertoe deformity on left. Strength within normal limits in all groups bilateral.   Assessment and Plan: Problem List Items Addressed This Visit    None    Visit Diagnoses    Capsulitis    -  Primary   Neuroma       Hammertoe of left foot       Left foot pain           -Complete examination performed -Re-Discussed conservative care for capsulitis with hammertoe versus recurrent neuroma -No reinjection at this time since symptoms are improved. Advised patient that she may require repeat injection if flares up again. -Recommend continue with icing, pads, and good supportive shoes -Recommend low impact exercise -Patient to return to office as needed or sooner if condition worsens.  Landis Martins, DPM

## 2017-05-07 ENCOUNTER — Encounter: Payer: Self-pay | Admitting: Internal Medicine

## 2017-05-07 ENCOUNTER — Ambulatory Visit (INDEPENDENT_AMBULATORY_CARE_PROVIDER_SITE_OTHER): Payer: Medicare Other | Admitting: Internal Medicine

## 2017-05-07 VITALS — BP 110/70 | HR 82 | Temp 97.9°F | Resp 16 | Ht 60.0 in | Wt 137.2 lb

## 2017-05-07 DIAGNOSIS — L03011 Cellulitis of right finger: Secondary | ICD-10-CM

## 2017-05-07 DIAGNOSIS — M7702 Medial epicondylitis, left elbow: Secondary | ICD-10-CM

## 2017-05-07 MED ORDER — DOXYCYCLINE HYCLATE 100 MG PO CAPS: ORAL_CAPSULE | ORAL | 0 refills | Status: DC

## 2017-05-07 MED ORDER — DEXAMETHASONE SODIUM PHOSPHATE 10 MG/ML IJ SOLN
10.0000 mg | Freq: Once | INTRAMUSCULAR | Status: AC
  Administered 2017-05-07: 10 mg via INTRAMUSCULAR

## 2017-05-07 NOTE — Progress Notes (Addendum)
Subjective:    Patient ID: Alexandra Lindsey, female    DOB: 1941/06/13, 76 y.o.   MRN: [id number]  HPI  Patient presented with a tender inflamed swollen tip of her Rt index finger. Also in Dec 2017 , she had a steroid injection to her Lt medial epicondylitis which apparently has reoccurred.   Medication Sig  . carbidopa-levodopa (SINEMET IR) 25-100 MG tablet 3 tablets at 5:00 am, 2 tablets at 9:00 am, 1 tablet at 1:00 pm, 2 tablets at 5:00 pm and one at night, extra PRN  . VITAMIN D 1000 UNITS Take 5,000 Units by mouth daily.  [name] ibuprofen 200 MG tablet Take  every 6  hrs as needed.   [name] MIRAPEX 1 MG tablet TAKE 1 TAB 3 TIMES DAILY  . ranitidine  150 MG tablet Take  daily as needed. For acid reflex  . vit B-12 1000 MCG tab Take 1,000 mcg  daily.    Allergies  Allergen Reactions  . Macrobid [Nitrofurantoin Macrocrystal] Swelling and Rash  . Baclofen Other (See Comments)    Kept her awake, affected memory  . Gabapentin Other (See Comments)    Weak feeling; made her feel " loopy".   . Cephalosporins Other (See Comments)    Cannot sleep  . Codeine Nausea Only    dizziness  . Darvocet [Propoxyphene N-Acetaminophen] Nausea And Vomiting  . Levaquin [Levofloxacin] Hives  . Penicillins Rash  . Selegiline Hcl Rash  . Tramadol Nausea And Vomiting  . Triamcinolone Itching   Past Medical History:  Diagnosis Date  . Anemia    as young girl and teenager  . Arthritis   . Chest pain, unspecified   . Constipation   . Costochondritis   . GERD (gastroesophageal reflux disease)   . Headache    ocular migraines  . History of hiatal hernia   . Hyperlipidemia 10-07-12  . Never smoked tobacco   . Parkinson's disease ([place]) 10-07-12  . Parkinson's disease ([place])   . Prediabetes   . 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 Spinal stenosis    Past Surgical History:  Procedure Laterality Date  . ABDOMINAL HYSTERECTOMY    . APPENDECTOMY    . BACK SURGERY  12/20/2015  . BUNIONECTOMY Bilateral  11-11-3  . CARDIAC CATHETERIZATION Bilateral 2013  . COLONOSCOPY    . HERNIA REPAIR    . LEFT HEART CATHETERIZATION WITH CORONARY ANGIOGRAM N/A 07/03/2012   Procedure: LEFT HEART CATHETERIZATION WITH CORONARY ANGIOGRAM;  Surgeon: Troy Sine, MD;  Location: [facility] CATH LAB;  Service: Cardiovascular;  Laterality: N/A;  . REPAIR OF CEREBROSPINAL FLUID LEAK N/A 12/24/2015   Procedure: LUMBAR WOUND EXPLORATION, REPAIR OF CEREBROSPINAL FLUID LEAK, PLACEMENT OF LUMBAR DRAIN;  Surgeon: Kevan Ny Ditty, MD;  Location: [place] NEURO ORS;  Service: Neurosurgery;  Laterality: N/A;  . ROTATOR CUFF REPAIR Left 10-07-12   Review of Systems  10 point systems review negative except as above.    Objective:   Physical Exam  BP 110/70   Pulse 82   Temp 97.9 F (36.6 C)   Resp 16   Ht 5' (1.524 m)   Wt 137 lb 3.2 oz (62.2 kg)   BMI 26.80 kg/m   HEENT - WNL. Neck - supple.  Chest - Clear equal BS. Cor - Nl HS. RRR w/o sig MGR. PP 1(+). No edema. MS- FROM w/o deformities.  Gait Nl. (+) trigger point tenderness at the Lt Elbow medial epicondyle. Neuro -  Nl w/o focal abnormalities. Skin-  There a tender erythematous swelling STS along the anterolateral nail margin of the Rt index finger w/o fluctuance.  Procedure After informed consent and aseptic prep the Lt elbow medial epicondylar trigger point was infiltrated with 1 ml (10 mg) of Dexamethasone and 1 ml of Marcaine 0.5%.     Assessment & Plan:   1. Paronychia of right index finger  - doxycycline (VIBRAMYCIN) 100 MG capsule; Take 1 capsule 2 x/day with food for 5 days -  then 1 x/day with food for 10 days  Dispense: 20 capsule; Refill: 0  2. Medial epicondylitis of left elbow  - dexamethasone (DECADRON) injection 10 mg; Inject 1 mL (10 mg total) into the muscle once.

## 2017-05-08 NOTE — Progress Notes (Signed)
Alexandra Lindsey was seen today in the movement disorders clinic for neurologic consultation at the request of Unk Pinto, MD.  The patient has previously seen both Dr. Erling Cruz and Dr. Rexene Alberts.  The records that were available to me were reviewed.  The consultation is for the evaluation and treatment of Parkinson's disease.  The patient has had a diagnosis of Parkinson's disease since at least 2002.  The first symptom was inability to get out of a chair and dragging the L leg and shuffling.  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    The patient was placed on Mirapex in 2002 and remains on that medication.  Once she was started on that medication she could immediately clog again.  She is currently on 1 mg 3 times a day.  She was placed on Stalevo 3 or 4 years later and is currently on Stalevo 100 mg, one tablet at 6 AM, half a tablet at 9 AM, 1 tablet at noon, half a tablet at 3 PM and one tablet at 6 PM.  In addition, she takes a regular carbidopa/levodopa 25/100 in the AM and chews it.  If she doesn't do that, she shuffles.  She chews that pill and then 45 mins later starts the Stalevo regimen.  She does state that Stalevo has become quite expensive for her and her insurance is going to change and asks about alternatives.    10/06/14 update:  The patient presents today, accompanied by her husband to supplement the history.  Last visit, I discontinued the patient's Stalevo, primarily because of cost and changed her to cupboard up a/levodopa 25/100, and we decided to hold the entacapone component.  She chews one tablet in the morning followed by carbidopa/levodopa 25/100 one tablet at 6 AM, half a tablet at 9 AM, 1 tablet at noon, half a tablet at 3 PM and one tablet at 6 PM.  She admits that she did not even take her 3 PM dose today, and is not sure that she even needs it.  Overall, from a Parkinson standpoint she feels great.  She admits to some  dyskinesia in the lower legs, but states that that is not bothersome.  Her biggest issue continues to be low back pain and left hip pain.  She has been back to Dr. Neomia Dear but did not get much relief.  She subsequently saw Dr. Lorin Mercy at [facility].  She was started on 100 mg of Neurontin at bedtime, but that only helped her sleep.  She states that first thing in the morning she has such pain in the left hip and back that she can barely walk.  01/07/15 update:  Pt f/u today re: PD.  She is on carbidopa/levodopa 25/100.  She chews one tablet in the morning followed by carbidopa/levodopa 25/100 one tablet at 6 AM, half a tablet at 9 AM, 1 tablet at noon, half a tablet at 3 PM and one tablet at 6 PM.  She is also on mirapex 1 mg tid.  She came off of meds today so that I could see what she looks like off.  She feels very stiff.  She feels that she isnt moving well.  She did have one fall since last visit.  She fell off of the stool at her vanity.   She didn't get hurt.   She saw Dr. Tamala Julian since last visit.  I  reviewed those records.  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  She is doing much better in that regard.  03/29/15 update:  Pt states that she is not doing as well as she was because of the back/leg pain again.  She continues to see Dr. Tamala Julian and has had further injections.  This one was in the bursa and she states that this one didn't help.  She is not walking as well.   She states that her PD meds are wearing off before they are due for the next dosing.  She takes one full tablet of levodopa at 5 AM (chewed), one tablet at 8 AM, half a tablet at 11 AM, 1 tablet at 1 PM, half a tablet at 4 PM and one tablet at 6 PM.  She remains on pramipexole 1.0 mg 3 times per day.  She just does not feel like she has the energy that she use to.  She has very little dyskinesia.  She has not had any falls.  05/25/15 update:  Pt is f/u today. I increased her  carbidopa/levodopa 25/100 to one tablet 6 times a day and she is on pramipexole 1.0 mg tid.  I referred her for neurorehab but she declined and she did not want therapy.  She has been to see Dr Tamala Julian and she had a SI joint injection.  She states that she uses a bar of soap in sheets for leg cramps and started putting that on her hip.  She now lathers soap on her hip and thinks that is why her hip is virtually pain free.  She also states that she was having weak spells and her gabapentin was d/c and she is doing better.  She is trying to exercise some at home (leg lifts, sit ups, dancing/clogging).  She is overall feeling great and better than she has is over a year.  She is very pleased.  10/07/15 update:  The patient is following up today regarding her Parkinson's disease.  She remains on pramipexole, 1.0 mg 3 times a day.  She is also on carbidopa/levodopa 1 tablet at 5 AM/8 AM/11 AM/1 PM/4 PM/6 PM, for a total of 600 mg of levodopa per day.  She has had some shuffling and the AM is the worst.  Also, whenever she gets up in the middle of the night to go to the bathroom, she can hardly get off of the bed and has trouble moving.  No falls since our last visit.  She has been trying to exercise/dance but that has been more troublesome lately.  No hallucinations but does have some "bad dreams."   I did review her records since our last visit.  She has seen Dr. Hulan Saas 2 times since our last visit regarding her hip and low back pain as well as SI joint dysfunction.  He ordered an EMG which was done by Dr. Posey Pronto on 09/21/2015.  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  She states that she is rubbing bar soap on it and she feels that this helps tremendously.  She did d/c baclofen because it was causing cognitive dulling.  She states that she d/c gabapentin as well as made her feel weak and without energy.    02/17/16 update:  The patient is following up today regarding her Parkinson's disease.  She remains on  pramipexole, 1.0 mg 3 times a day.  She is also on carbidopa/levodopa 1 tablet at 5 AM/8 AM/11 AM/1 PM/4  PM/6 PM, and I added carbidopa/levodopa 50/200 last visit.  She called me and stated that this made her worse, and I did not think that this made much sense, but held the medication and she still felt worse, so I asked her to follow-up with her primary care physician to make sure nothing else was going wrong.  She did follow-up with her primary care physician, but ultimately ended up staying off of the medication.  I did review her records since our last visit.  She underwent lumbar decompression surgery and was hospitalized from 12/20/2015 to 12/22/2015.  This was done by Dr. Sherwood Gambler.  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  She reports that she doesn't remember 13 days but she is much better.  She never experienced a set back with her Parkinsons disease.  She did fall during that 2nd hospital stay attempting to get up by herself and then she fell at the SNF during rehab but didn't get hurt.  After SNF rehab, she had [facility] at the home and they just stopped therapy a week and a half ago.    05/16/16 update:  The patient is following up today regarding her Parkinson's disease.  She remains on pramipexole, 1.0 mg 3 times a day.  She was on carbidopa/levodopa 1 tablet at 5 AM/8 AM/11 AM/1 PM/4 PM/6 PM but since last visit she changed the timing of the dosage (5am/6am/11am/12pm/4pm/6pm).  She had restarted her carbidopa/levodopa 50/200 at night, but was only on it for about 3 days and then she discontinued it.  She then called back and stated that she was worse, but also admits that she has held her morning 2 dosages of levodopa.  She stayed off of the night levodopa as she said that it made worse, even though she had called [place] after that was d/c  to say she was worse.  I told her she needed to stay on a regular schedule and recommended physical therapy.  She wanted to try water aerobics instead of formal physical therapy.   She states that she is better today but overall feels worse over the last month or so.  She is stiff and having trouble moving.  Mornings are definitely the biggest problem.  Can have issue in the afternoon but that timing/frequency isn't consistent like the AM.  Uses walker when goes out.  Saw Dr. Rita Ohara last week and told that back issues were not cause of her problem, although when she bends over or tries to get up, she still has pain.    08/16/16 update:   The patient follows up today regarding Parkinson's disease.  We changed the way she dosed her carbidopa/levodopa 25/100 to, 3 tablets between 5-6am/2 at 9am/1 at 1pm/2 at 5pm.  Reports that this was helpful.  She is also on pramipexole, 1 mg 3 times per day.   Reports that she is much better.  She thinks that all of her deterioration was just from taking a nighttime CR of levodopa.  She is walking somewhat.   She denies sleep attacks.  Denies compulsive behaviors.  She denies hallucinations.  She has had no falls since our last visit.  She fell a few times.  With one time her feet froze and she fell forward.  With one, she was in the grass and went to stand up and fell back.  With one, she was in the kitchen and fell.  Can't get her walker through doors and asks me for new RX.  She had lab work after our last visit including a urinalysis and chemistry which were normal.  She had a TSH by her primary care physician about a week after our visit and that was normal.  States that for the last 2 weeks she has had urinary frequency.  Went to UC and started on doxycyline and then had call after the cx and told she didn't need it.  Has had urinary incontinence.  Reports that her back is very slowly getting better.    01/12/17 update:  Patient follows up today.  She is on  carbidopa/levodopa 25/100, 3 tablets between 5 and 6 AM/2 tablets at 9 AM/1 tablet at 1 PM/2 tablets at 5 PM and then will take another at bedtime.  She is on pramipexole 1 mg 3 times per day.  Last visit, I talked to her about Apokyn injections for freezing, and she did not think that she was interested, but called back after her last visit and stated that she was not doing well and was interested in the [place].  We sent her the release of information form to get her started on it but she didn't send it back.  She states that she just decided she didn't want to do that.  She has had no hallucinations.  No lightheadedness or near syncope.  She did see [facility] on 08/17/2016.  I reviewed those notes, which I received just yesterday (called and requested them).  She was started on Myrbetriq.  She states that she didn't need it and the bladder got better.  She did put a "potty chair" next to her bed so she didn't have to walk so far in the middle of the night.  She is having more problems with her back and she an MRI and CT of the back with Dr. Rita Ohara.  She has fallen several times.  She thinks her back is the reason for the falls.  Not doing therapy and Dr. Rita Ohara told her to wait on that for the scans.  05/16/17 update:  Pt f/u today.  She is on carbidopa/levodopa 25/100, 3 tablets at 5:00 am, 2 tablets at 9:00 am, 1 tablet at 1:00 pm, 2 tablets at 5:00 pm and one at night.  She remains on pramipexole 1mg  tid.    Not lightheaded or dizzy.  Has had several falls (about 5) since our last visit.  Never had a fall with a walker/cane.    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 Macrobid [Nitrofurantoin Macrocrystal] Swelling and Rash  . Baclofen  Other (See Comments)    Kept her awake, affected memory  . Gabapentin Other (See Comments)    Weak feeling; made her feel " loopy".   . Cephalosporins Other (See Comments)    Cannot sleep  . Codeine Nausea Only    dizziness  . Darvocet [Propoxyphene N-Acetaminophen] Nausea And Vomiting  . Levaquin [Levofloxacin] Hives  . Penicillins Rash  . Selegiline Hcl Rash  . Tramadol Nausea And Vomiting  . Triamcinolone Itching    CURRENT MEDICATIONS:  Outpatient Encounter Prescriptions as of 05/16/2017  Medication  Sig  . carbidopa-levodopa (SINEMET IR) 25-100 MG tablet 3 tablets at 5:00 am, 2 tablets at 9:00 am, 1 tablet at 1:00 pm, 2 tablets at 5:00 pm and one at night, extra PRN  . cholecalciferol (VITAMIN D) 1000 UNITS tablet Take 5,000 Units by mouth daily.  [name] doxycycline (VIBRAMYCIN) 100 MG capsule Take 1 capsule 2 x/day with food for 5 days -  then 1 x/day with food for 10 days  . ibuprofen (ADVIL,MOTRIN) 200 MG tablet Take 200 mg by mouth every 6 (six) hours as needed. Reported on 12/24/2015  . pramipexole (MIRAPEX) 1 MG tablet TAKE 1 TABLET BY MOUTH 3 TIMES DAILY  . ranitidine (ZANTAC) 150 MG tablet Take 150 mg by mouth daily as needed. For acid reflex  . vitamin B-12 (CYANOCOBALAMIN) 1000 MCG tablet Take 1,000 mcg by mouth daily.   No facility-administered encounter medications on file as of 05/16/2017.     PAST MEDICAL HISTORY:   Past Medical History:  Diagnosis Date  . Anemia    as young girl and teenager  . Arthritis   . Chest pain, unspecified   . Constipation   . Costochondritis   . GERD (gastroesophageal reflux disease)   . Headache    ocular migraines  . History of hiatal hernia   . Hyperlipidemia 10-07-12  . Never smoked tobacco   . Parkinson's disease ([place]) 10-07-12  . Parkinson's disease ([place])   . Prediabetes   . 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 Spinal stenosis     PAST SURGICAL HISTORY:   Past Surgical History:  Procedure Laterality Date  .  ABDOMINAL HYSTERECTOMY    . APPENDECTOMY    . BACK SURGERY  12/20/2015  . BUNIONECTOMY Bilateral 11-11-3  . CARDIAC CATHETERIZATION Bilateral 2013  . COLONOSCOPY    . HERNIA REPAIR    . LEFT HEART CATHETERIZATION WITH CORONARY ANGIOGRAM N/A 07/03/2012   Procedure: LEFT HEART CATHETERIZATION WITH CORONARY ANGIOGRAM;  Surgeon: Troy Sine, MD;  [facility] C[facility] LAB;  Service: Cardiovascular;  Laterality: N/A;  . REPAIR OF CEREBROSPINAL FLUID LEAK N/A 12/24/2015   Procedure: LUMBAR WOUND EXPLORATION, REPAIR OF CEREBROSPINAL FLUID LEAK, PLACEMENT OF LUMBAR DRAIN;  Surgeon: Kevan Ny Ditty, MD;  Location: [facility] NEURO ORS;  Service: Neurosurgery;  Laterality: N/A;  . ROTATOR CUFF REPAIR Left 10-07-12    SOCIAL HISTORY:   Social History   Social History  . Marital status: Married    Spouse name: N/A  . Number of children: N/A  . Years of education: N/A   Occupational History  . retired     Chiropractor   Social History Main Topics  . Smoking status: Never Smoker  . Smokeless tobacco: Never Used  . Alcohol use No  . Drug use: No  . Sexual activity: Not on file   Other Topics Concern  . Not on file   Social History Narrative  . No narrative on file    FAMILY HISTORY:   Family Status  Relation Status  . Mother Deceased       esophageal cancer, heart attack  . Father Deceased       suicide  . Brother Deceased       suicide  . Brother Deceased       HTN, hypercholesterolemia  . Brother Alive       mental retardation    ROS:      A complete 10 system review of systems was obtained and was unremarkable apart from what is  mentioned above.  PHYSICAL EXAMINATION:    VITALS:   Vitals:   05/16/17 0735  BP: (!) 88/54  Pulse: 86  SpO2: 92%  Weight: 139 lb (63 kg)  Height: 5' (1.524 m)   Orthostatic VS for the past 24 hrs (Last 3 readings):  BP- Lying Pulse- Lying BP- Sitting Pulse- Sitting BP- Standing at 0 minutes Pulse- Standing at 0 minutes  05/16/17 0747  100/64 80 110/70 80 108/60 86     GEN:  The patient appears stated age and is in NAD. HEENT:  Normocephalic, atraumatic.  The mucous membranes are moist. The superficial temporal arteries are without ropiness or tenderness. CV:  RRR Lungs:  CTAB Neck/HEME:  There are no carotid bruits bilaterally. Skin:  Back incision is healing and no erythema or drainage.  Neurological examination:  Orientation: The patient is alert and oriented x3.  Cranial nerves: There is good facial symmetry.  No significant facial hypomimia.   Extraocular muscles are intact.  There are no square wave jerks.  The visual fields are full to confrontational testing. The speech is fluent and clear. Soft palate rises symmetrically and there is no tongue deviation. Hearing is intact to conversational tone. Sensation: Sensation is intact to light touch throughout. Motor: Strength is 5/5 in the bilateral upper and lower extremities.   Shoulder shrug is equal and symmetric.  There is no pronator drift.  Movement examination: Tone: There is normal tone in the UE/LE Abnormal movements: there is moderate amount of dyskinesia in the trunk and legs Coordination:  There is decremation with toe taps on the L.   Gait and Station: The patient has no difficulty getting out of the chair.  Stride length is good.    ASSESSMENT/PLAN:  1.  Idiopathic PD.    -continue Carbidopa Levodopa as follows: 3 tablets at 5:00 am, 2 tablets at 9:00 am, 1 tablet at 1:00 pm, 2 tablets at 5:00 pm and one at night.  -take extra carbidopa/levodopa 25/100 prn   -Talked to her about risks of mirapex 1 mg tid as she ages but didn't change that today.  She has no SE with it.  -could be DBS candidate but she has refused  -encouraged to use walker at all times as had more falls but not falling with the walker  -let me know when wants PT  -talked to our social worker today 2.  Urinary incontinence and frequency  -doing better in this regard 3.  Back  pain  -She is seeing Dr. Rita Ohara and having MRI.  Has had several falls since her back surgery. 4.  RBD.  -  She did not want to take the clonazepam, primarily because of its addictive properties.  She did try it for a short period of time.  She tried Seroquel as well, but it caused diplopia.  She does not want to try anything further right now. 5 .  Low blood pressure  -likely autonomic insufficiency but not orthostatic in office today.  Talked about proper hydration.    -refuses compression socks/binder today  -doesn't want medication right now but told her that I suspect she will need florinef/midodrine/droxidopa in the very near future 6 .  Follow-up with me in 4 months, sooner should new issues arise.  Time in the room with the patient was 30 min.  Greater than 50% in counseling.

## 2017-05-10 ENCOUNTER — Ambulatory Visit (INDEPENDENT_AMBULATORY_CARE_PROVIDER_SITE_OTHER): Payer: Medicare Other | Admitting: Internal Medicine

## 2017-05-10 VITALS — BP 110/60 | HR 70 | Temp 97.5°F | Resp 16 | Ht 60.0 in | Wt 136.8 lb

## 2017-05-10 DIAGNOSIS — L03011 Cellulitis of right finger: Secondary | ICD-10-CM | POA: Diagnosis not present

## 2017-05-12 ENCOUNTER — Encounter: Payer: Self-pay | Admitting: Internal Medicine

## 2017-05-12 NOTE — Progress Notes (Signed)
  Subjective:    Patient ID: Alexandra Lindsey, female    DOB: Mar 27, 1941, 76 y.o.   MRN: [id number]  HPI  Patient was seen 3 days previously for a paronychia of the tip of the Rt index finger and started on Doxycline.  Medication Sig  . carbidopa-levodopa (SINEMET IR) 25-100 MG tablet 3 tablets at 5:00 am, 2 tablets at 9:00 am, 1 tablet at 1:00 pm, 2 tablets at 5:00 pm and one at night, extra PRN  . cholecalciferol (VITAMIN D) 1000 UNITS tablet Take 5,000 Units by mouth daily.  [name] doxycycline (VIBRAMYCIN) 100 MG capsule Take 1 capsule 2 x/day with food for 5 days -  then 1 x/day with food for 10 days  . ibuprofen (ADVIL,MOTRIN) 200 MG tablet Take 200 mg by mouth every 6 (six) hours as needed. Reported on 12/24/2015  . pramipexole (MIRAPEX) 1 MG tablet TAKE 1 TABLET BY MOUTH 3 TIMES DAILY  . ranitidine (ZANTAC) 150 MG tablet Take 150 mg by mouth daily as needed. For acid reflex  . vitamin B-12 (CYANOCOBALAMIN) 1000 MCG tablet Take 1,000 mcg by mouth daily.   Allergies  Allergen Reactions  . Macrobid [Nitrofurantoin Macrocrystal] Swelling and Rash  . Baclofen Other (See Comments)    Kept her awake, affected memory  . Gabapentin Other (See Comments)    Weak feeling; made her feel " loopy".   . Cephalosporins Other (See Comments)    Cannot sleep  . Codeine Nausea Only    dizziness  . Darvocet [Propoxyphene N-Acetaminophen] Nausea And Vomiting  . Levaquin [Levofloxacin] Hives  . Penicillins Rash  . Selegiline Hcl Rash  . Tramadol Nausea And Vomiting  . Triamcinolone Itching   Past Medical History:  Diagnosis Date  . Anemia    as young girl and teenager  . Arthritis   . Chest pain, unspecified   . Constipation   . Costochondritis   . GERD (gastroesophageal reflux disease)   . Headache    ocular migraines  . History of hiatal hernia   . Hyperlipidemia 10-07-12  . Never smoked tobacco   . Parkinson's disease ([place]) 10-07-12  . Parkinson's disease ([place])   . Prediabetes   . RBD (REM  behavioral disorder) 02/13/2013  . Spinal stenosis    Review of Systems    10 point systems review negative except as above.    Objective:   Physical Exam   BP 110/60   Pulse 70   Temp 97.5 F (36.4 C)   Resp 16   Ht 5' (1.524 m)   Wt 136 lb 12.8 oz (62.1 kg)   BMI 26.72 kg/m   HEENT - WNL. Neck - supple.  Chest - Clear equal BS. Cor - Nl HS. RRR w/o sig MGR. PP 1(+). No edema. MS- FROM w/o deformities.  Gait Nl. Neuro -  Nl w/o focal abnormalities.  Skin - after informed consent and aseptic prep with alcohol the Rt index finger tip was locally anesthetized with 1 ml Lidocaine 1%. Then the necrotic infected and inflamed tissue along the medial nail edge was sharply debrided and the wound base was hyfrecated with the Hyfrecator and sterile dressing was applied.     Assessment & Plan:   1. Paronychia of right index finger  - debrided and patient instructed in wound care.

## 2017-05-16 ENCOUNTER — Encounter: Payer: Self-pay | Admitting: Neurology

## 2017-05-16 ENCOUNTER — Ambulatory Visit (INDEPENDENT_AMBULATORY_CARE_PROVIDER_SITE_OTHER): Payer: Medicare Other | Admitting: Neurology

## 2017-05-16 VITALS — BP 88/54 | HR 86 | Ht 60.0 in | Wt 139.0 lb

## 2017-05-16 DIAGNOSIS — G909 Disorder of the autonomic nervous system, unspecified: Secondary | ICD-10-CM

## 2017-05-16 DIAGNOSIS — G20 Parkinson's disease: Secondary | ICD-10-CM | POA: Diagnosis not present

## 2017-05-16 DIAGNOSIS — G249 Dystonia, unspecified: Secondary | ICD-10-CM

## 2017-05-16 NOTE — Progress Notes (Signed)
Clinical Social Work Note  CSW met with pt during today's follow up visit. CSW provided supportive listening as pt discussed things that she does to live well with Parkinson's disease. Pt discussed her active membership in her church and that she does clogging. Pt plans to attend upcoming Parkinson's Education Symposium and CSW assisted with registration for pt and pt husband. Pt agreeable to be featured in a slide show at the symposium to highlight individuals living their best life with Parkinson's disease. Pt plans to gather pictures of her dancing for CSW to include in presentation. Pt signed a Permission for Releasing health Information and Taking Photographs release during today's visit. CSW provided pt with CSW contact information and discussed with pt that CSW remains available to provide psychosocial support to pt as needed.   Alison Murray, MSW, LCSW Clinical Social Worker [facility] [phone]

## 2017-05-18 ENCOUNTER — Telehealth: Payer: Self-pay | Admitting: Neurology

## 2017-05-18 NOTE — Telephone Encounter (Signed)
Spoke with patient and she states that the last two days she has had increased issues with freezing. Talked her through how/when to take her extra Levodopa if needed. She does admit she is under increased stress because of her husband's health (highly allergic to bees with injections every few months for it, he was stung yesterday). She will proceed as instructed and call if needed.

## 2017-05-18 NOTE — Telephone Encounter (Signed)
PT called and said she could hardly walk and wants to know if you have any suggestions

## 2017-05-21 NOTE — Telephone Encounter (Signed)
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 She has had two injections and this is still bothering her. She was told to hold PT until this is better.

## 2017-05-21 NOTE — Telephone Encounter (Signed)
Left message on machine for patient to call back.

## 2017-05-21 NOTE — Telephone Encounter (Signed)
Does she think that a course of PT could help?  She is right that stress will greatly affect freezing

## 2017-06-05 ENCOUNTER — Ambulatory Visit: Payer: Self-pay | Admitting: Internal Medicine

## 2017-06-13 ENCOUNTER — Encounter (INDEPENDENT_AMBULATORY_CARE_PROVIDER_SITE_OTHER): Payer: Self-pay | Admitting: Orthopaedic Surgery

## 2017-06-13 ENCOUNTER — Ambulatory Visit (INDEPENDENT_AMBULATORY_CARE_PROVIDER_SITE_OTHER): Payer: Medicare Other

## 2017-06-13 ENCOUNTER — Ambulatory Visit (INDEPENDENT_AMBULATORY_CARE_PROVIDER_SITE_OTHER): Payer: Medicare Other | Admitting: Physician Assistant

## 2017-06-13 DIAGNOSIS — M7702 Medial epicondylitis, left elbow: Secondary | ICD-10-CM

## 2017-06-13 MED ORDER — DICLOFENAC SODIUM 1 % TD GEL: 2.0000 g | Freq: Four times a day (QID) | TRANSDERMAL | 1 refills | Status: DC

## 2017-06-13 NOTE — Progress Notes (Signed)
Office Visit Note   Patient: Alexandra Lindsey           Date of Birth: 08/11/1941           MRN: [id number] Visit Date: 06/13/2017              Requested by: Unk Pinto, [place],  [address] PCP: Unk Pinto, MD   Assessment & Plan: Visit Diagnoses:  1. Medial epicondylitis of elbow, left     Plan: We will send her to physical therapy for modalities and stretching exercises to the left elbow. Have her apply Voltaren gel 2 g 4 times daily to the medial epicondyle region. Plate see her back here in about 4-6 weeks to check progress lack of. Questions encouraged and answered by myself and Dr. Ninfa Linden.  Follow-Up Instructions: Return in about 4 weeks (around 07/11/2017).   Orders:  Orders Placed This Encounter  Procedures  . XR Elbow 2 Views Left   Meds ordered this encounter  Medications  . diclofenac sodium (VOLTAREN) 1 % GEL    Sig: Apply 2 g topically 4 (four) times daily.    Dispense:  1 Tube    Refill:  1      Procedures: No procedures performed   Clinical Data: No additional findings.   Subjective: Chief Complaint  Patient presents with  . Left Elbow - Pain    HPI Mrs. Scripter is a 76 year old female who comes in today with chief complaint of left elbow pain. She states that her she falls a lot due to her Parkinson's disease. She estimates that she is following some 20 times in the past 2 years. She denies any loss consciousness dizziness. She is unsure she may have hurt her elbow during one of these falls. Her elbows pain is been ongoing for at least past year she denies any radicular symptoms down the arm. She's had 2 shots in her elbow and she points to the medial epicondyle region states that these actually made her pain worse. She does take ibuprofen and this helps. Also blue IMU helps. Review of Systems Positive for multiple falls. Positive for left elbow pain. Negative for syncope or dizziness otherwise see  history of present illness  Objective: Vital Signs: There were no vitals taken for this visit.  Physical Exam  Constitutional: She is oriented to person, place, and time. She appears well-developed and well-nourished. No distress.  Pulmonary/Chest: Effort normal.  Neurological: She is alert and oriented to person, place, and time.  Skin: She is not diaphoretic.  Psychiatric: She has a normal mood and affect. Her behavior is normal.    Ortho Exam Left elbow she has good range of motion the elbow without pain she has full supination pronation of forearm. Volar flexion against resistance causes pain medial aspect the elbow. No rashes skin lesions ulcerations over the left elbow. Radial pulses 2+. Sensation grossly intact throughout the hand.  Specialty Comments:  No specialty comments available.  Imaging: Xr Elbow 2 Views Left  Result Date: 06/13/2017 AP lateral views left elbow: Elbow joint well maintained. No acute fractures no bony abnormalities or lesions. No dislocation subluxation.    PMFS History: Patient Active Problem List   Diagnosis Date Noted  . Positive colorectal cancer screening using Cologuard test 03/26/2017  . 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 Lumbar radiculopathy 09/13/2015  . Greater trochanteric bursitis of right hip 08/04/2015  . Greater trochanteric bursitis  of left hip 02/17/2015  . Piriformis syndrome of left side 11/05/2014  . SI (sacroiliac) joint dysfunction 11/05/2014  . Vitamin D deficiency 09/06/2014  . Medication management 09/06/2014  . Hypertension   . GERD (gastroesophageal reflux disease)   . Prediabetes   . 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 Parkinson's disease ([place])   . 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 Hyperlipidemia 10/07/2012   Past Medical History:  Diagnosis Date  . Anemia    as young girl and teenager  . Arthritis   . Chest pain, unspecified   .  Constipation   . Costochondritis   . GERD (gastroesophageal reflux disease)   . Headache    ocular migraines  . History of hiatal hernia   . Hyperlipidemia 10-07-12  . Never smoked tobacco   . Parkinson's disease ([place]) 10-07-12  . Parkinson's disease ([place])   . Prediabetes   . 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 Spinal stenosis     Family History  Problem Relation Age of Onset  . Heart attack Mother   . Esophageal cancer Mother   . CVA Brother     Past Surgical History:  Procedure Laterality Date  . ABDOMINAL HYSTERECTOMY    . APPENDECTOMY    . BACK SURGERY  12/20/2015  . BUNIONECTOMY Bilateral 11-11-3  . CARDIAC CATHETERIZATION Bilateral 2013  . COLONOSCOPY    . HERNIA REPAIR    . LEFT HEART CATHETERIZATION WITH CORONARY ANGIOGRAM N/A 07/03/2012   Procedure: LEFT HEART CATHETERIZATION WITH CORONARY ANGIOGRAM;  Surgeon: Troy Sine, MD;  Location: [facility] CATH LAB;  Service: Cardiovascular;  Laterality: N/A;  . REPAIR OF CEREBROSPINAL FLUID LEAK N/A 12/24/2015   Procedure: LUMBAR WOUND EXPLORATION, REPAIR OF CEREBROSPINAL FLUID LEAK, PLACEMENT OF LUMBAR DRAIN;  Surgeon: Kevan Ny Ditty, MD;  Location: [facility] NEURO ORS;  Service: Neurosurgery;  Laterality: N/A;  . ROTATOR CUFF REPAIR Left 10-07-12   Social History   Occupational History  . retired     Chiropractor   Social History Main Topics  . Smoking status: Never Smoker  . Smokeless tobacco: Never Used  . Alcohol use No  . Drug use: No  . Sexual activity: Not on file

## 2017-06-15 ENCOUNTER — Telehealth (INDEPENDENT_AMBULATORY_CARE_PROVIDER_SITE_OTHER): Payer: Self-pay | Admitting: Orthopaedic Surgery

## 2017-06-15 NOTE — Telephone Encounter (Signed)
Voltaren Gel needs PA

## 2017-06-15 NOTE — Telephone Encounter (Signed)
Patient called advised the Rx that was called in need prior auth. She advised the insurance company did not approve filling the Rx.  Patient advised she use [facility]. The number to contact pharmacy is [phone]. The number to contact patient is [phone]

## 2017-06-18 ENCOUNTER — Telehealth (INDEPENDENT_AMBULATORY_CARE_PROVIDER_SITE_OTHER): Payer: Self-pay

## 2017-06-18 ENCOUNTER — Telehealth (INDEPENDENT_AMBULATORY_CARE_PROVIDER_SITE_OTHER): Payer: Self-pay | Admitting: Orthopaedic Surgery

## 2017-06-18 NOTE — Telephone Encounter (Signed)
Patient aware her insurance will not cover this

## 2017-06-18 NOTE — Telephone Encounter (Signed)
Patient was calling concerning PA for Voltaren Gel.  CB# is [phone].  Please advise.

## 2017-06-18 NOTE — Telephone Encounter (Signed)
Patient called back concerning prior auth needed for the voltaren gel to be filled. Patient said she spoke with Lon at [employer]. The ph# to contact insurance company is [phone]   The number to contact patient is [phone] or [phone]

## 2017-07-11 ENCOUNTER — Ambulatory Visit (INDEPENDENT_AMBULATORY_CARE_PROVIDER_SITE_OTHER): Payer: Medicare Other | Admitting: Orthopaedic Surgery

## 2017-07-14 DIAGNOSIS — L600 Ingrowing nail: Secondary | ICD-10-CM | POA: Diagnosis not present

## 2017-07-14 DIAGNOSIS — M7712 Lateral epicondylitis, left elbow: Secondary | ICD-10-CM | POA: Diagnosis not present

## 2017-07-14 DIAGNOSIS — L2089 Other atopic dermatitis: Secondary | ICD-10-CM | POA: Diagnosis not present

## 2017-07-16 DIAGNOSIS — L600 Ingrowing nail: Secondary | ICD-10-CM | POA: Diagnosis not present

## 2017-07-16 DIAGNOSIS — Z85828 Personal history of other malignant neoplasm of skin: Secondary | ICD-10-CM | POA: Diagnosis not present

## 2017-07-16 DIAGNOSIS — L603 Nail dystrophy: Secondary | ICD-10-CM | POA: Diagnosis not present

## 2017-07-16 DIAGNOSIS — R21 Rash and other nonspecific skin eruption: Secondary | ICD-10-CM | POA: Diagnosis not present

## 2017-07-16 DIAGNOSIS — L309 Dermatitis, unspecified: Secondary | ICD-10-CM | POA: Diagnosis not present

## 2017-08-01 DIAGNOSIS — M5416 Radiculopathy, lumbar region: Secondary | ICD-10-CM | POA: Diagnosis not present

## 2017-08-01 DIAGNOSIS — M546 Pain in thoracic spine: Secondary | ICD-10-CM | POA: Diagnosis not present

## 2017-08-01 DIAGNOSIS — M5136 Other intervertebral disc degeneration, lumbar region: Secondary | ICD-10-CM | POA: Diagnosis not present

## 2017-08-01 DIAGNOSIS — Z981 Arthrodesis status: Secondary | ICD-10-CM | POA: Diagnosis not present

## 2017-08-01 DIAGNOSIS — M4726 Other spondylosis with radiculopathy, lumbar region: Secondary | ICD-10-CM | POA: Diagnosis not present

## 2017-08-01 DIAGNOSIS — M545 Low back pain: Secondary | ICD-10-CM | POA: Diagnosis not present

## 2017-08-07 DIAGNOSIS — M5126 Other intervertebral disc displacement, lumbar region: Secondary | ICD-10-CM | POA: Diagnosis not present

## 2017-08-07 DIAGNOSIS — M4726 Other spondylosis with radiculopathy, lumbar region: Secondary | ICD-10-CM | POA: Diagnosis not present

## 2017-08-08 DIAGNOSIS — M4726 Other spondylosis with radiculopathy, lumbar region: Secondary | ICD-10-CM | POA: Diagnosis not present

## 2017-08-08 DIAGNOSIS — M5416 Radiculopathy, lumbar region: Secondary | ICD-10-CM | POA: Diagnosis not present

## 2017-08-08 DIAGNOSIS — M5136 Other intervertebral disc degeneration, lumbar region: Secondary | ICD-10-CM | POA: Diagnosis not present

## 2017-08-08 DIAGNOSIS — M5126 Other intervertebral disc displacement, lumbar region: Secondary | ICD-10-CM | POA: Diagnosis not present

## 2017-08-08 DIAGNOSIS — Z981 Arthrodesis status: Secondary | ICD-10-CM | POA: Diagnosis not present

## 2017-08-15 DIAGNOSIS — Z23 Encounter for immunization: Secondary | ICD-10-CM | POA: Diagnosis not present

## 2017-08-29 NOTE — Progress Notes (Deleted)
Assessment and Plan:  Hypertension:  -Continue medication,  -monitor blood pressure at home.  -Continue DASH diet.   -Reminder to go to the ER if any CP, SOB, nausea, dizziness, severe HA, changes vision/speech, left arm numbness and tingling, and jaw pain.  Cholesterol: -Continue diet and exercise.  -Check cholesterol.   Pre-diabetes: -Continue diet and exercise.  -Check A1C  Vitamin D Def: -check level -continue medications.    Continue diet and meds as discussed. Further disposition pending results of labs.  HPI 76 y.o. female  presents for 3 month follow up with hypertension, hyperlipidemia, prediabetes and vitamin D   Her blood pressure has been controlled at home, today their BP is  .   She does not workout. She denies chest pain, shortness of breath, dizziness.   She is on cholesterol medication and denies myalgias. Her cholesterol is at goal. The cholesterol last visit was:   Lab Results  Component Value Date   CHOL 204 (H) 11/17/2016   HDL 54 11/17/2016   LDLCALC 131 (H) 11/17/2016   TRIG 97 11/17/2016   CHOLHDL 3.8 11/17/2016    She has been working on diet and exercise for prediabetes, and denies foot ulcerations, hyperglycemia, hypoglycemia , increased appetite, nausea, paresthesia of the feet, polydipsia, polyuria, visual disturbances, vomiting and weight loss. Last A1C in the office was:  Lab Results  Component Value Date   HGBA1C 5.4 11/17/2016   Patient is on Vitamin D supplement.  Lab Results  Component Value Date   VD25OH 58 11/17/2016     BMI is There is no height or weight on file to calculate BMI., she is working on diet and exercise. Wt Readings from Last 3 Encounters:  05/16/17 139 lb (63 kg)  05/10/17 136 lb 12.8 oz (62.1 kg)  05/07/17 137 lb 3.2 oz (62.2 kg)    Current Medications:  Current Outpatient Prescriptions on File Prior to Visit  Medication Sig Dispense Refill  . carbidopa-levodopa (SINEMET IR) 25-100 MG tablet 3 tablets at  5:00 am, 2 tablets at 9:00 am, 1 tablet at 1:00 pm, 2 tablets at 5:00 pm and one at night, extra PRN 900 tablet 1  . cholecalciferol (VITAMIN D) 1000 UNITS tablet Take 5,000 Units by mouth daily.    . diclofenac sodium (VOLTAREN) 1 % GEL Apply 2 g topically 4 (four) times daily. 1 Tube 1  . doxycycline (VIBRAMYCIN) 100 MG capsule Take 1 capsule 2 x/day with food for 5 days -  then 1 x/day with food for 10 days 20 capsule 0  . ibuprofen (ADVIL,MOTRIN) 200 MG tablet Take 200 mg by mouth every 6 (six) hours as needed. Reported on 12/24/2015    . pramipexole (MIRAPEX) 1 MG tablet TAKE 1 TABLET BY MOUTH 3 TIMES DAILY 270 tablet 1  . ranitidine (ZANTAC) 150 MG tablet Take 150 mg by mouth daily as needed. For acid reflex    . vitamin B-12 (CYANOCOBALAMIN) 1000 MCG tablet Take 1,000 mcg by mouth daily.     No current facility-administered medications on file prior to visit.     Medical History:  Past Medical History:  Diagnosis Date  . Anemia    as young girl and teenager  . Arthritis   . Chest pain, unspecified   . Constipation   . Costochondritis   . GERD (gastroesophageal reflux disease)   . Headache    ocular migraines  . History of hiatal hernia   . Hyperlipidemia 10-07-12  . Never smoked tobacco   .  Parkinson's disease ([place]) 10-07-12  . Parkinson's disease ([place])   . Prediabetes   . 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 Spinal stenosis     Allergies:  Allergies  Allergen Reactions  . Macrobid [Nitrofurantoin Macrocrystal] Swelling and Rash  . Baclofen Other (See Comments)    Kept her awake, affected memory  . Gabapentin Other (See Comments)    Weak feeling; made her feel " loopy".   . Cephalosporins Other (See Comments)    Cannot sleep  . Codeine Nausea Only    dizziness  . Darvocet [Propoxyphene N-Acetaminophen] Nausea And Vomiting  . Levaquin [Levofloxacin] Hives  . Penicillins Rash  . Selegiline Hcl Rash  . Tramadol Nausea And Vomiting  . Triamcinolone Itching      Review of Systems:  Review of Systems  Constitutional: Negative for chills, fever and malaise/fatigue.  HENT: Negative for congestion, ear pain and sore throat.   Eyes: Negative.   Respiratory: Negative for cough, shortness of breath and wheezing.   Cardiovascular: Negative for chest pain, palpitations and leg swelling.  Gastrointestinal: Negative for abdominal pain, blood in stool, constipation, diarrhea, heartburn and melena.  Genitourinary: Negative.   Musculoskeletal: Negative for back pain.  Neurological: Negative for dizziness, loss of consciousness and headaches.  Psychiatric/Behavioral: Negative for depression. The patient is not nervous/anxious and does not have insomnia.     Family history- Review and unchanged  Social history- Review and unchanged  Physical Exam: There were no vitals taken for this visit. Wt Readings from Last 3 Encounters:  05/16/17 139 lb (63 kg)  05/10/17 136 lb 12.8 oz (62.1 kg)  05/07/17 137 lb 3.2 oz (62.2 kg)    General Appearance: Well nourished well developed, in no apparent distress. Eyes: PERRLA, EOMs, conjunctiva no swelling or erythema ENT/Mouth: Ear canals normal without obstruction, swelling, erythma, discharge.  TMs normal bilaterally.  Oropharynx moist, clear, without exudate, or postoropharyngeal swelling. Neck: Supple, thyroid normal,no cervical adenopathy  Respiratory: Respiratory effort normal, Breath sounds clear A&P without rhonchi, wheeze, or rale.  No retractions, no accessory usage. Cardio: RRR with no MRGs. Brisk peripheral pulses without edema.  Abdomen: Soft, + BS,  Non tender, no guarding, rebound, hernias, masses. Musculoskeletal: Full ROM, 5/5 strength, Normal gait Skin: Warm, dry without rashes, lesions, ecchymosis.  Neuro: Awake and oriented X 3, Cranial nerves intact. Normal muscle tone, no cerebellar symptoms. Psych: Normal affect, Insight and Judgment appropriate.    Vicie Mutters, PA-C 1:59  PM [facility]

## 2017-08-31 ENCOUNTER — Ambulatory Visit: Payer: Self-pay | Admitting: Physician Assistant

## 2017-08-31 ENCOUNTER — Ambulatory Visit: Payer: Self-pay | Admitting: Internal Medicine

## 2017-09-03 DIAGNOSIS — M5136 Other intervertebral disc degeneration, lumbar region: Secondary | ICD-10-CM | POA: Diagnosis not present

## 2017-09-03 DIAGNOSIS — M5416 Radiculopathy, lumbar region: Secondary | ICD-10-CM | POA: Diagnosis not present

## 2017-09-07 ENCOUNTER — Other Ambulatory Visit: Payer: Self-pay | Admitting: Neurosurgery

## 2017-09-07 DIAGNOSIS — M5416 Radiculopathy, lumbar region: Secondary | ICD-10-CM

## 2017-09-13 ENCOUNTER — Telehealth: Payer: Self-pay | Admitting: Neurology

## 2017-09-13 NOTE — Telephone Encounter (Signed)
Needs to talk to someone about if she needs to keep appt

## 2017-09-13 NOTE — Telephone Encounter (Signed)
Spoke with patient. She states she has been doing well with Parkinson's Disease. She has been having terrible back pain - being treated by [facility].   I did advise she should keep appt with [place] for follow up.

## 2017-09-18 ENCOUNTER — Other Ambulatory Visit: Payer: Self-pay | Admitting: Neurosurgery

## 2017-09-18 DIAGNOSIS — M4726 Other spondylosis with radiculopathy, lumbar region: Secondary | ICD-10-CM | POA: Diagnosis not present

## 2017-09-18 DIAGNOSIS — R030 Elevated blood-pressure reading, without diagnosis of hypertension: Secondary | ICD-10-CM | POA: Diagnosis not present

## 2017-09-18 DIAGNOSIS — Z6826 Body mass index (BMI) 26.0-26.9, adult: Secondary | ICD-10-CM | POA: Diagnosis not present

## 2017-09-18 DIAGNOSIS — Z981 Arthrodesis status: Secondary | ICD-10-CM | POA: Diagnosis not present

## 2017-09-18 DIAGNOSIS — M5136 Other intervertebral disc degeneration, lumbar region: Secondary | ICD-10-CM | POA: Diagnosis not present

## 2017-09-18 DIAGNOSIS — M5416 Radiculopathy, lumbar region: Secondary | ICD-10-CM | POA: Diagnosis not present

## 2017-09-18 DIAGNOSIS — M48062 Spinal stenosis, lumbar region with neurogenic claudication: Secondary | ICD-10-CM | POA: Diagnosis not present

## 2017-09-18 NOTE — Progress Notes (Signed)
Alexandra Lindsey was seen today in the movement disorders clinic for neurologic consultation at the request of Unk Pinto, MD.  The patient has previously seen both Dr. Erling Cruz and Dr. Rexene Alberts.  The records that were available to me were reviewed.  The consultation is for the evaluation and treatment of Parkinson's disease.  The patient has had a diagnosis of Parkinson's disease since at least 2002.  The first symptom was inability to get out of a chair and dragging the L leg and shuffling.  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    The patient was placed on Mirapex in 2002 and remains on that medication.  Once she was started on that medication she could immediately clog again.  She is currently on 1 mg 3 times a day.  She was placed on Stalevo 3 or 4 years later and is currently on Stalevo 100 mg, one tablet at 6 AM, half a tablet at 9 AM, 1 tablet at noon, half a tablet at 3 PM and one tablet at 6 PM.  In addition, she takes a regular carbidopa/levodopa 25/100 in the AM and chews it.  If she doesn't do that, she shuffles.  She chews that pill and then 45 mins later starts the Stalevo regimen.  She does state that Stalevo has become quite expensive for her and her insurance is going to change and asks about alternatives.    10/06/14 update:  The patient presents today, accompanied by her husband to supplement the history.  Last visit, I discontinued the patient's Stalevo, primarily because of cost and changed her to cupboard up a/levodopa 25/100, and we decided to hold the entacapone component.  She chews one tablet in the morning followed by carbidopa/levodopa 25/100 one tablet at 6 AM, half a tablet at 9 AM, 1 tablet at noon, half a tablet at 3 PM and one tablet at 6 PM.  She admits that she did not even take her 3 PM dose today, and is not sure that she even needs it.  Overall, from a Parkinson standpoint she feels great.  She admits to some  dyskinesia in the lower legs, but states that that is not bothersome.  Her biggest issue continues to be low back pain and left hip pain.  She has been back to Dr. Neomia Dear but did not get much relief.  She subsequently saw Dr. Lorin Mercy at [facility].  She was started on 100 mg of Neurontin at bedtime, but that only helped her sleep.  She states that first thing in the morning she has such pain in the left hip and back that she can barely walk.  01/07/15 update:  Pt f/u today re: PD.  She is on carbidopa/levodopa 25/100.  She chews one tablet in the morning followed by carbidopa/levodopa 25/100 one tablet at 6 AM, half a tablet at 9 AM, 1 tablet at noon, half a tablet at 3 PM and one tablet at 6 PM.  She is also on mirapex 1 mg tid.  She came off of meds today so that I could see what she looks like off.  She feels very stiff.  She feels that she isnt moving well.  She did have one fall since last visit.  She fell off of the stool at her vanity.   She didn't get hurt.   She saw Dr. Tamala Julian since last visit.  I  reviewed those records.  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  She is doing much better in that regard.  03/29/15 update:  Pt states that she is not doing as well as she was because of the back/leg pain again.  She continues to see Dr. Tamala Julian and has had further injections.  This one was in the bursa and she states that this one didn't help.  She is not walking as well.   She states that her PD meds are wearing off before they are due for the next dosing.  She takes one full tablet of levodopa at 5 AM (chewed), one tablet at 8 AM, half a tablet at 11 AM, 1 tablet at 1 PM, half a tablet at 4 PM and one tablet at 6 PM.  She remains on pramipexole 1.0 mg 3 times per day.  She just does not feel like she has the energy that she use to.  She has very little dyskinesia.  She has not had any falls.  05/25/15 update:  Pt is f/u today. I increased her  carbidopa/levodopa 25/100 to one tablet 6 times a day and she is on pramipexole 1.0 mg tid.  I referred her for neurorehab but she declined and she did not want therapy.  She has been to see Dr Tamala Julian and she had a SI joint injection.  She states that she uses a bar of soap in sheets for leg cramps and started putting that on her hip.  She now lathers soap on her hip and thinks that is why her hip is virtually pain free.  She also states that she was having weak spells and her gabapentin was d/c and she is doing better.  She is trying to exercise some at home (leg lifts, sit ups, dancing/clogging).  She is overall feeling great and better than she has is over a year.  She is very pleased.  10/07/15 update:  The patient is following up today regarding her Parkinson's disease.  She remains on pramipexole, 1.0 mg 3 times a day.  She is also on carbidopa/levodopa 1 tablet at 5 AM/8 AM/11 AM/1 PM/4 PM/6 PM, for a total of 600 mg of levodopa per day.  She has had some shuffling and the AM is the worst.  Also, whenever she gets up in the middle of the night to go to the bathroom, she can hardly get off of the bed and has trouble moving.  No falls since our last visit.  She has been trying to exercise/dance but that has been more troublesome lately.  No hallucinations but does have some "bad dreams."   I did review her records since our last visit.  She has seen Dr. Hulan Saas 2 times since our last visit regarding her hip and low back pain as well as SI joint dysfunction.  He ordered an EMG which was done by Dr. Posey Pronto on 09/21/2015.  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  She states that she is rubbing bar soap on it and she feels that this helps tremendously.  She did d/c baclofen because it was causing cognitive dulling.  She states that she d/c gabapentin as well as made her feel weak and without energy.    02/17/16 update:  The patient is following up today regarding her Parkinson's disease.  She remains on  pramipexole, 1.0 mg 3 times a day.  She is also on carbidopa/levodopa 1 tablet at 5 AM/8 AM/11 AM/1 PM/4  PM/6 PM, and I added carbidopa/levodopa 50/200 last visit.  She called me and stated that this made her worse, and I did not think that this made much sense, but held the medication and she still felt worse, so I asked her to follow-up with her primary care physician to make sure nothing else was going wrong.  She did follow-up with her primary care physician, but ultimately ended up staying off of the medication.  I did review her records since our last visit.  She underwent lumbar decompression surgery and was hospitalized from 12/20/2015 to 12/22/2015.  This was done by Dr. Sherwood Gambler.  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  She reports that she doesn't remember 13 days but she is much better.  She never experienced a set back with her Parkinsons disease.  She did fall during that 2nd hospital stay attempting to get up by herself and then she fell at the SNF during rehab but didn't get hurt.  After SNF rehab, she had [facility] at the home and they just stopped therapy a week and a half ago.    05/16/16 update:  The patient is following up today regarding her Parkinson's disease.  She remains on pramipexole, 1.0 mg 3 times a day.  She was on carbidopa/levodopa 1 tablet at 5 AM/8 AM/11 AM/1 PM/4 PM/6 PM but since last visit she changed the timing of the dosage (5am/6am/11am/12pm/4pm/6pm).  She had restarted her carbidopa/levodopa 50/200 at night, but was only on it for about 3 days and then she discontinued it.  She then called back and stated that she was worse, but also admits that she has held her morning 2 dosages of levodopa.  She stayed off of the night levodopa as she said that it made worse, even though she had called [place] after that was d/c  to say she was worse.  I told her she needed to stay on a regular schedule and recommended physical therapy.  She wanted to try water aerobics instead of formal physical therapy.   She states that she is better today but overall feels worse over the last month or so.  She is stiff and having trouble moving.  Mornings are definitely the biggest problem.  Can have issue in the afternoon but that timing/frequency isn't consistent like the AM.  Uses walker when goes out.  Saw Dr. Rita Ohara last week and told that back issues were not cause of her problem, although when she bends over or tries to get up, she still has pain.    08/16/16 update:   The patient follows up today regarding Parkinson's disease.  We changed the way she dosed her carbidopa/levodopa 25/100 to, 3 tablets between 5-6am/2 at 9am/1 at 1pm/2 at 5pm.  Reports that this was helpful.  She is also on pramipexole, 1 mg 3 times per day.   Reports that she is much better.  She thinks that all of her deterioration was just from taking a nighttime CR of levodopa.  She is walking somewhat.   She denies sleep attacks.  Denies compulsive behaviors.  She denies hallucinations.  She has had no falls since our last visit.  She fell a few times.  With one time her feet froze and she fell forward.  With one, she was in the grass and went to stand up and fell back.  With one, she was in the kitchen and fell.  Can't get her walker through doors and asks me for new RX.  She had lab work after our last visit including a urinalysis and chemistry which were normal.  She had a TSH by her primary care physician about a week after our visit and that was normal.  States that for the last 2 weeks she has had urinary frequency.  Went to UC and started on doxycyline and then had call after the cx and told she didn't need it.  Has had urinary incontinence.  Reports that her back is very slowly getting better.    01/12/17 update:  Patient follows up today.  She is on  carbidopa/levodopa 25/100, 3 tablets between 5 and 6 AM/2 tablets at 9 AM/1 tablet at 1 PM/2 tablets at 5 PM and then will take another at bedtime.  She is on pramipexole 1 mg 3 times per day.  Last visit, I talked to her about Apokyn injections for freezing, and she did not think that she was interested, but called back after her last visit and stated that she was not doing well and was interested in the [place].  We sent her the release of information form to get her started on it but she didn't send it back.  She states that she just decided she didn't want to do that.  She has had no hallucinations.  No lightheadedness or near syncope.  She did see [facility] on 08/17/2016.  I reviewed those notes, which I received just yesterday (called and requested them).  She was started on Myrbetriq.  She states that she didn't need it and the bladder got better.  She did put a "potty chair" next to her bed so she didn't have to walk so far in the middle of the night.  She is having more problems with her back and she an MRI and CT of the back with Dr. Rita Ohara.  She has fallen several times.  She thinks her back is the reason for the falls.  Not doing therapy and Dr. Rita Ohara told her to wait on that for the scans.  05/16/17 update:  Pt f/u today.  She is on carbidopa/levodopa 25/100, 3 tablets at 5:00 am, 2 tablets at 9:00 am, 1 tablet at 1:00 pm, 2 tablets at 5:00 pm and one at night.  She remains on pramipexole 1mg  tid.    Not lightheaded or dizzy.  Has had several falls (about 5) since our last visit.  Never had a fall with a walker/cane.    09/19/17 update: Patient seen in follow-up today regarding her Parkinson's disease.  She remains on carbidopa/levodopa 25/100, 3 tablets at 5 AM, 2 tablets at 9 AM, 1 tablet at 1 PM, 2 tablets at 5 PM and 1 tablet at bedtime.  She is also on pramipexole 1 mg 3 times per day.  She has had no lightheadedness or near syncope.  No compulsive behaviors.  She has had 7 falls in  last few months - her hands were not on the walker when it happened.  She has had some episodes of freezing, which have been worse when she is under stress.  No sleep attacks.  Her biggest issue has been low back pain.  She has been following with [facility].  I do not have any notes in that regard.  She reports that she is having back surgery next week.  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 Macrobid [Nitrofurantoin Macrocrystal] Swelling and Rash  . Baclofen Other (See Comments)    Kept her awake, affected memory  . Gabapentin Other (See Comments)    Weak feeling; made her feel " loopy".   . Cephalosporins Other (See Comments)    Cannot sleep  . Codeine Nausea Only    dizziness  . Darvocet [Propoxyphene N-Acetaminophen] Nausea And Vomiting  . Levaquin [Levofloxacin] Hives  . Penicillins Rash  . Selegiline Hcl Rash  . Tramadol Nausea And Vomiting  . Triamcinolone Itching    CURRENT MEDICATIONS:  Outpatient Encounter Prescriptions as of 09/19/2017  Medication Sig  . carbidopa-levodopa (SINEMET IR) 25-100 MG tablet 3 tablets at 5:00 am, 2 tablets at 9:00 am, 1 tablet at 1:00 pm, 2 tablets at 5:00 pm and one at night, extra PRN  . cholecalciferol (VITAMIN D) 1000 UNITS tablet Take 5,000 Units by mouth daily.  . diclofenac sodium (VOLTAREN) 1 % GEL Apply 2 g topically 4 (four) times daily.  [name] ibuprofen (ADVIL,MOTRIN) 200 MG tablet Take 200 mg by mouth every 6 (six) hours as needed. Reported on 12/24/2015  . oxyCODONE (OXY IR/ROXICODONE) 5 MG immediate release tablet   . pramipexole (MIRAPEX) 1 MG tablet TAKE 1 TABLET BY MOUTH 3 TIMES DAILY  . ranitidine (ZANTAC) 150 MG tablet Take 150 mg by mouth daily as needed. For acid  reflex  . vitamin B-12 (CYANOCOBALAMIN) 1000 MCG tablet Take 1,000 mcg by mouth daily.  . [DISCONTINUED] doxycycline (VIBRAMYCIN) 100 MG capsule Take 1 capsule 2 x/day with food for 5 days -  then 1 x/day with food for 10 days   No facility-administered encounter medications on file as of 09/19/2017.     PAST MEDICAL HISTORY:   Past Medical History:  Diagnosis Date  . Anemia    as young girl and teenager  . Arthritis   . Chest pain, unspecified   . Constipation   . Costochondritis   . GERD (gastroesophageal reflux disease)   . Headache    ocular migraines  . History of hiatal hernia   . Hyperlipidemia 10-07-12  . Never smoked tobacco   . Parkinson's disease ([place]) 10-07-12  . Parkinson's disease ([place])   . Prediabetes   . 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 Spinal stenosis     PAST SURGICAL HISTORY:   Past Surgical History:  Procedure Laterality Date  . ABDOMINAL HYSTERECTOMY    . APPENDECTOMY    . BACK SURGERY  12/20/2015  . BUNIONECTOMY Bilateral 11-11-3  . CARDIAC CATHETERIZATION Bilateral 2013  . COLONOSCOPY    . HERNIA REPAIR    . LEFT HEART CATHETERIZATION WITH CORONARY ANGIOGRAM N/A 07/03/2012   Procedure: LEFT HEART CATHETERIZATION WITH CORONARY ANGIOGRAM;  Surgeon: Troy Sine, MD;  [facility] C[facility];  Service: Cardiovascular;  Laterality: N/A;  . REPAIR OF CEREBROSPINAL FLUID LEAK N/A 12/24/2015   Procedure: LUMBAR WOUND EXPLORATION, REPAIR OF CEREBROSPINAL FLUID LEAK, PLACEMENT OF LUMBAR DRAIN;  Surgeon: Kevan Ny Ditty, MD;  Location: [facility] NEURO ORS;  Service: Neurosurgery;  Laterality: N/A;  . ROTATOR CUFF REPAIR Left 10-07-12    SOCIAL HISTORY:   Social History   Social History  . Marital status: Married    Spouse name: N/A  .  Number of children: N/A  . Years of education: N/A   Occupational History  . retired     Chiropractor   Social History Main Topics  . Smoking status: Never Smoker  . Smokeless tobacco: Never Used  .  Alcohol use No  . Drug use: No  . Sexual activity: Not on file   Other Topics Concern  . Not on file   Social History Narrative  . No narrative on file    FAMILY HISTORY:   Family Status  Relation Status  . Mother Deceased       esophageal cancer, heart attack  . Father Deceased       suicide  . Brother Deceased       suicide  . Brother Deceased       HTN, hypercholesterolemia  . Brother Alive       mental retardation    ROS:      A complete 10 system review of systems was obtained and was unremarkable apart from what is mentioned above.  PHYSICAL EXAMINATION:    VITALS:   Vitals:   09/19/17 1017  BP: 100/70  Pulse: 82  SpO2: 96%  Weight: 135 lb (61.2 kg)  Height: 5' (1.524 m)   No data found.    GEN:  The patient appears stated age and is in NAD. HEENT:  Normocephalic, atraumatic.  The mucous membranes are moist. The superficial temporal arteries are without ropiness or tenderness. CV:  RRR Lungs:  CTAB Neck/HEME:  There are no carotid bruits bilaterally. Skin:  Back incision is healing and no erythema or drainage.  Neurological examination:  Orientation: The patient is alert and oriented x3.  Cranial nerves: There is good facial symmetry.  No significant facial hypomimia.   Extraocular muscles are intact.  There are no square wave jerks.  The visual fields are full to confrontational testing. The speech is fluent and clear. Soft palate rises symmetrically and there is no tongue deviation. Hearing is intact to conversational tone. Sensation: Sensation is intact to light touch throughout. Motor: Strength is 5/5 in the bilateral upper and lower extremities.   Shoulder shrug is equal and symmetric.  There is no pronator drift.  Movement examination: Tone: There is normal tone in the UE/LE Abnormal movements: there is moderate amount of dyskinesia in the trunk and legs.  The left leg has more dyskinesia than the right. Coordination:  There is decremation with  toe taps on the L.   Gait and Station: The patient has no difficulty getting out of the chair.  Stride length is good.  She has mildly antalgic because of back pain.  ASSESSMENT/PLAN:  1.  Idiopathic PD.    -continue Carbidopa Levodopa as follows: 3 tablets at 5:00 am, 2 tablets at 9:00 am, 1 tablet at 1:00 pm, 2 tablets at 5:00 pm and one at night.  -take extra carbidopa/levodopa 25/100 prn   -Talked to her about risks of mirapex 1 mg tid as she ages but didn't change that today.  She has no SE with it.  -could be DBS candidate but she has refused  -I talked to her extensively today, as I have in the past, about using her walker at all times.  She does not fall when she has a walker, but has fallen multiple times since our last visit without the walker.  -talked to our social worker today 2.  Urinary incontinence and frequency  -doing better in this regard 3.  Back pain  -  She is following with Dr. Sherwood Gambler.  She is having back surgery next week.  We talked about precautions with surgery and Parkinson's disease. 4.  RBD.  -  She did not want to take the clonazepam, primarily because of its addictive properties.  She did try it for a short period of time.  She tried Seroquel as well, but it caused diplopia.  She does not want to try anything further right now. 5 .  Low blood pressure  -likely autonomic insufficiency but she is doing better in this regard.  -refuses compression socks/binder today  -doesn't want medication right now but told her that I suspect she will need florinef/midodrine/droxidopa in the very near future 6 .  I will plan on seeing her back in the next 4 months, sooner should new neurologic issues arise.  Greater than 50% of the 25-minute visit was spent in counseling.

## 2017-09-19 ENCOUNTER — Ambulatory Visit (INDEPENDENT_AMBULATORY_CARE_PROVIDER_SITE_OTHER): Payer: Medicare Other | Admitting: Neurology

## 2017-09-19 ENCOUNTER — Encounter: Payer: Self-pay | Admitting: Neurology

## 2017-09-19 VITALS — BP 100/70 | HR 82 | Ht 60.0 in | Wt 135.0 lb

## 2017-09-19 DIAGNOSIS — M5416 Radiculopathy, lumbar region: Secondary | ICD-10-CM

## 2017-09-19 DIAGNOSIS — G20 Parkinson's disease: Secondary | ICD-10-CM

## 2017-09-19 DIAGNOSIS — G249 Dystonia, unspecified: Secondary | ICD-10-CM | POA: Diagnosis not present

## 2017-09-19 NOTE — Patient Instructions (Signed)
1. Take your Parkinson's medications the morning of surgery if you are able.  Avoid phenergan for nausea, ask for zofran instead if needed.

## 2017-09-20 ENCOUNTER — Inpatient Hospital Stay
Admission: RE | Admit: 2017-09-20 | Discharge: 2017-09-20 | Disposition: A | Discharge status: Home / Self Care | Payer: Medicare Other | Source: Ambulatory Visit | Attending: Neurosurgery | Admitting: Neurosurgery

## 2017-09-20 NOTE — Pre-Procedure Instructions (Signed)
Alexandra Lindsey  09/20/2017      [facility] - [place], [place] - [address] [place] [address] Phone: [phone] Fax: [phone]    Your procedure is scheduled on   Thursday  09/27/17  Report to [facility] Admitting at 530 A.M.  Call this number if you have problems the morning of surgery:  [phone]   Remember:  Do not eat food or drink liquids after midnight.  Take these medicines the morning of surgery with A SIP OF WATER   CARBIDOPA-LEVODOPA, ZANTAC IF NEEDED, EYE DROPS IF NEEDED  7 days prior to surgery STOP taking any Aspirin (unless otherwise instructed by your surgeon), Aleve, Naproxen, Ibuprofen, Motrin, Advil, Goody's, BC's, all herbal medications, fish oil, and all vitamins   Do not wear jewelry, make-up or nail polish.  Do not wear lotions, powders, or perfumes, or deoderant.  Do not shave 48 hours prior to surgery.  Men may shave face and neck.  Do not bring valuables to the hospital.  [facility] is not responsible for any belongings or valuables.  Contacts, dentures or bridgework may not be worn into surgery.  Leave your suitcase in the car.  After surgery it may be brought to your room.  For patients admitted to the hospital, discharge time will be determined by your treatment team.  Patients discharged the day of surgery will not be allowed to drive home.   Name and phone number of your driver:    Special instructions:  [facility] - Preparing for Surgery  Before surgery, you can play an important role.  Because skin is not sterile, your skin needs to be as free of germs as possible.  You can reduce the number of germs on you skin by washing with CHG (chlorahexidine gluconate) soap before surgery.  CHG is an antiseptic cleaner which kills germs and bonds with the skin to continue killing germs even after washing.  Please DO NOT use if you have an allergy to CHG or antibacterial soaps.  If your skin  becomes reddened/irritated stop using the CHG and inform your nurse when you arrive at Short Stay.  Do not shave (including legs and underarms) for at least 48 hours prior to the first CHG shower.  You may shave your face.  Please follow these instructions carefully:   1.  Shower with CHG Soap the night before surgery and the                                morning of Surgery.  2.  If you choose to wash your hair, wash your hair first as usual with your       normal shampoo.  3.  After you shampoo, rinse your hair and body thoroughly to remove the                      Shampoo.  4.  Use CHG as you would any other liquid soap.  You can apply chg directly       to the skin and wash gently with scrungie or a clean washcloth.  5.  Apply the CHG Soap to your body ONLY FROM THE NECK DOWN.        Do not use on open wounds or open sores.  Avoid contact with your eyes,  ears, mouth and genitals (private parts).  Wash genitals (private parts)       with your normal soap.  6.  Wash thoroughly, paying special attention to the area where your surgery        will be performed.  7.  Thoroughly rinse your body with warm water from the neck down.  8.  DO NOT shower/wash with your normal soap after using and rinsing off       the CHG Soap.  9.  Pat yourself dry with a clean towel.            10.  Wear clean pajamas.            11.  Place clean sheets on your bed the night of your first shower and do not        sleep with pets.  Day of Surgery  Do not apply any lotions/deoderants the morning of surgery.  Please wear clean clothes to the hospital/surgery center.    Please read over the following fact sheets that you were given. MRSA Information and Surgical Site Infection Prevention

## 2017-09-21 ENCOUNTER — Encounter (HOSPITAL_COMMUNITY)
Admission: RE | Admit: 2017-09-21 | Discharge: 2017-09-21 | Disposition: A | Discharge status: Home / Self Care | Payer: Medicare Other | Source: Ambulatory Visit | Attending: Neurosurgery | Admitting: Neurosurgery

## 2017-09-21 ENCOUNTER — Encounter (HOSPITAL_COMMUNITY): Payer: Self-pay

## 2017-09-21 DIAGNOSIS — M549 Dorsalgia, unspecified: Secondary | ICD-10-CM | POA: Diagnosis not present

## 2017-09-21 DIAGNOSIS — Z01812 Encounter for preprocedural laboratory examination: Secondary | ICD-10-CM | POA: Insufficient documentation

## 2017-09-21 LAB — BASIC METABOLIC PANEL
Anion gap: 7 (ref 5–15)
BUN: 25 mg/dL — ABNORMAL HIGH (ref 6–20)
CALCIUM: 8.9 mg/dL (ref 8.9–10.3)
CO2: 24 mmol/L (ref 22–32)
CREATININE: 0.87 mg/dL (ref 0.44–1.00)
Chloride: 108 mmol/L (ref 101–111)
GLUCOSE: 98 mg/dL (ref 65–99)
Potassium: 3.9 mmol/L (ref 3.5–5.1)
Sodium: 139 mmol/L (ref 135–145)

## 2017-09-21 LAB — TYPE AND SCREEN
ABO/RH(D): A NEG
Antibody Screen: NEGATIVE

## 2017-09-21 LAB — CBC
HCT: 37.1 % (ref 36.0–46.0)
Hemoglobin: 11.9 g/dL — ABNORMAL LOW (ref 12.0–15.0)
MCH: 29.3 pg (ref 26.0–34.0)
MCHC: 32.1 g/dL (ref 30.0–36.0)
MCV: 91.4 fL (ref 78.0–100.0)
PLATELETS: 252 10*3/uL (ref 150–400)
RBC: 4.06 MIL/uL (ref 3.87–5.11)
RDW: 13.2 % (ref 11.5–15.5)
WBC: 7 10*3/uL (ref 4.0–10.5)

## 2017-09-21 LAB — SURGICAL PCR SCREEN
MRSA, PCR: NEGATIVE
Staphylococcus aureus: NEGATIVE

## 2017-09-21 NOTE — Progress Notes (Signed)
No history of heart problems  Denies any cardiac tesing

## 2017-09-25 ENCOUNTER — Other Ambulatory Visit: Payer: Self-pay | Admitting: Neurology

## 2017-09-27 ENCOUNTER — Encounter (HOSPITAL_COMMUNITY)
Admission: RE | Disposition: A | Discharge status: Home Health | Payer: Self-pay | Source: Ambulatory Visit | Attending: Neurosurgery

## 2017-09-27 ENCOUNTER — Inpatient Hospital Stay (HOSPITAL_COMMUNITY): Payer: Medicare Other | Admitting: Certified Registered Nurse Anesthetist

## 2017-09-27 ENCOUNTER — Encounter (HOSPITAL_COMMUNITY): Payer: Self-pay | Admitting: *Deleted

## 2017-09-27 ENCOUNTER — Inpatient Hospital Stay (HOSPITAL_COMMUNITY): Payer: Medicare Other

## 2017-09-27 ENCOUNTER — Inpatient Hospital Stay (HOSPITAL_COMMUNITY)
Admission: RE | Admit: 2017-09-27 | Discharge: 2017-09-29 | DRG: 455 | Disposition: A | Discharge status: Home Health | Payer: Medicare Other | Source: Ambulatory Visit | Attending: Neurosurgery | Admitting: Neurosurgery

## 2017-09-27 DIAGNOSIS — Z419 Encounter for procedure for purposes other than remedying health state, unspecified: Secondary | ICD-10-CM

## 2017-09-27 DIAGNOSIS — R7303 Prediabetes: Secondary | ICD-10-CM | POA: Diagnosis present

## 2017-09-27 DIAGNOSIS — M4726 Other spondylosis with radiculopathy, lumbar region: Secondary | ICD-10-CM | POA: Diagnosis present

## 2017-09-27 DIAGNOSIS — G20 Parkinson's disease: Secondary | ICD-10-CM | POA: Diagnosis present

## 2017-09-27 DIAGNOSIS — M48062 Spinal stenosis, lumbar region with neurogenic claudication: Principal | ICD-10-CM | POA: Diagnosis present

## 2017-09-27 DIAGNOSIS — E785 Hyperlipidemia, unspecified: Secondary | ICD-10-CM | POA: Diagnosis present

## 2017-09-27 DIAGNOSIS — G249 Dystonia, unspecified: Secondary | ICD-10-CM | POA: Diagnosis not present

## 2017-09-27 DIAGNOSIS — K219 Gastro-esophageal reflux disease without esophagitis: Secondary | ICD-10-CM | POA: Diagnosis present

## 2017-09-27 DIAGNOSIS — I10 Essential (primary) hypertension: Secondary | ICD-10-CM | POA: Diagnosis present

## 2017-09-27 DIAGNOSIS — M4326 Fusion of spine, lumbar region: Secondary | ICD-10-CM | POA: Diagnosis not present

## 2017-09-27 HISTORY — PX: BACK SURGERY: SHX140

## 2017-09-27 SURGERY — POSTERIOR LUMBAR FUSION 1 LEVEL
Anesthesia: General | Site: Back

## 2017-09-27 MED ORDER — KCL IN DEXTROSE-NACL 20-5-0.45 MEQ/L-%-% IV SOLN
INTRAVENOUS | Status: AC
  Administered 2017-09-27: 100 mL/h via INTRAVENOUS
  Filled 2017-09-27: qty 1000

## 2017-09-27 MED ORDER — PHENOL 1.4 % MT LIQD: 1.0000 | OROMUCOSAL | Status: DC | PRN

## 2017-09-27 MED ORDER — THROMBIN (RECOMBINANT) 20000 UNITS EX SOLR
CUTANEOUS | Status: DC | PRN
  Administered 2017-09-27: 20 mL via TOPICAL

## 2017-09-27 MED ORDER — FENTANYL CITRATE (PF) 250 MCG/5ML IJ SOLN
INTRAMUSCULAR | Status: AC
  Filled 2017-09-27: qty 5

## 2017-09-27 MED ORDER — OXYCODONE HCL 5 MG PO TABS
ORAL_TABLET | ORAL | Status: AC
  Administered 2017-09-27: 5 mg via ORAL
  Filled 2017-09-27: qty 1

## 2017-09-27 MED ORDER — LIDOCAINE-EPINEPHRINE 1 %-1:100000 IJ SOLN
INTRAMUSCULAR | Status: DC | PRN
  Administered 2017-09-27: 5 mL

## 2017-09-27 MED ORDER — CHLORHEXIDINE GLUCONATE CLOTH 2 % EX PADS: 6.0000 | MEDICATED_PAD | Freq: Once | CUTANEOUS | Status: DC

## 2017-09-27 MED ORDER — MIDAZOLAM HCL 2 MG/2ML IJ SOLN: 0.5000 mg | Freq: Once | INTRAMUSCULAR | Status: DC | PRN

## 2017-09-27 MED ORDER — CARBIDOPA-LEVODOPA 25-100 MG PO TABS
2.0000 | ORAL_TABLET | Freq: Every day | ORAL | Status: DC
  Administered 2017-09-27 – 2017-09-28 (×2): 2 via ORAL
  Filled 2017-09-27 (×2): qty 2

## 2017-09-27 MED ORDER — BISACODYL 10 MG RE SUPP: 10.0000 mg | Freq: Every day | RECTAL | Status: DC | PRN

## 2017-09-27 MED ORDER — KETOROLAC TROMETHAMINE 30 MG/ML IJ SOLN
15.0000 mg | Freq: Once | INTRAMUSCULAR | Status: AC
  Administered 2017-09-27: 15 mg via INTRAVENOUS

## 2017-09-27 MED ORDER — SUGAMMADEX SODIUM 200 MG/2ML IV SOLN
INTRAVENOUS | Status: DC | PRN
  Administered 2017-09-27: 150 mg via INTRAVENOUS

## 2017-09-27 MED ORDER — CARBIDOPA-LEVODOPA 25-100 MG PO TABS
2.0000 | ORAL_TABLET | ORAL | Status: AC
  Administered 2017-09-27: 2 via ORAL
  Filled 2017-09-27: qty 2

## 2017-09-27 MED ORDER — CARBIDOPA-LEVODOPA 25-100 MG PO TABS
1.0000 | ORAL_TABLET | ORAL | Status: DC
  Administered 2017-09-28: 1 via ORAL
  Filled 2017-09-27 (×2): qty 1

## 2017-09-27 MED ORDER — FLEET ENEMA 7-19 GM/118ML RE ENEM: 1.0000 | ENEMA | Freq: Once | RECTAL | Status: DC | PRN

## 2017-09-27 MED ORDER — CARBIDOPA-LEVODOPA 25-100 MG PO TABS
1.0000 | ORAL_TABLET | Freq: Every evening | ORAL | Status: DC | PRN
  Filled 2017-09-27: qty 1

## 2017-09-27 MED ORDER — VANCOMYCIN HCL IN DEXTROSE 1-5 GM/200ML-% IV SOLN
INTRAVENOUS | Status: AC
  Filled 2017-09-27: qty 200

## 2017-09-27 MED ORDER — GENTAMICIN IN SALINE 1.6-0.9 MG/ML-% IV SOLN
80.0000 mg | INTRAVENOUS | Status: AC
  Administered 2017-09-27: 80 mg via INTRAVENOUS
  Filled 2017-09-27: qty 50

## 2017-09-27 MED ORDER — LIDOCAINE 2% (20 MG/ML) 5 ML SYRINGE
INTRAMUSCULAR | Status: DC | PRN
  Administered 2017-09-27: 40 mg via INTRAVENOUS

## 2017-09-27 MED ORDER — LIDOCAINE-EPINEPHRINE 1 %-1:100000 IJ SOLN
INTRAMUSCULAR | Status: AC
  Filled 2017-09-27: qty 1

## 2017-09-27 MED ORDER — CYCLOBENZAPRINE HCL 5 MG PO TABS
5.0000 mg | ORAL_TABLET | Freq: Three times a day (TID) | ORAL | Status: DC | PRN
  Administered 2017-09-27: 5 mg via ORAL
  Administered 2017-09-27: 10 mg via ORAL
  Filled 2017-09-27 (×2): qty 2

## 2017-09-27 MED ORDER — LORAZEPAM 2 MG/ML IJ SOLN
1.0000 mg | Freq: Once | INTRAMUSCULAR | Status: AC | PRN
Start: 2017-09-27 — End: 2017-09-27
  Administered 2017-09-27: 1 mg via INTRAVENOUS
  Filled 2017-09-27: qty 1

## 2017-09-27 MED ORDER — HYDROXYZINE HCL 50 MG/ML IM SOLN: 50.0000 mg | INTRAMUSCULAR | Status: DC | PRN

## 2017-09-27 MED ORDER — CYCLOBENZAPRINE HCL 10 MG PO TABS
ORAL_TABLET | ORAL | Status: AC
  Administered 2017-09-27: 10 mg via ORAL
  Filled 2017-09-27: qty 1

## 2017-09-27 MED ORDER — VANCOMYCIN HCL 1000 MG IV SOLR
INTRAVENOUS | Status: AC
  Filled 2017-09-27: qty 1000

## 2017-09-27 MED ORDER — MIDAZOLAM HCL 2 MG/2ML IJ SOLN
INTRAMUSCULAR | Status: AC
  Filled 2017-09-27: qty 2

## 2017-09-27 MED ORDER — LIDOCAINE 2% (20 MG/ML) 5 ML SYRINGE
INTRAMUSCULAR | Status: AC
  Filled 2017-09-27: qty 5

## 2017-09-27 MED ORDER — MEPERIDINE HCL 25 MG/ML IJ SOLN: 6.2500 mg | INTRAMUSCULAR | Status: DC | PRN

## 2017-09-27 MED ORDER — BACITRACIN 50000 UNITS IM SOLR
INTRAMUSCULAR | Status: DC | PRN
  Administered 2017-09-27 (×2): 500 mL

## 2017-09-27 MED ORDER — CARBIDOPA-LEVODOPA 25-100 MG PO TABS
2.0000 | ORAL_TABLET | ORAL | Status: DC
  Administered 2017-09-28 – 2017-09-29 (×2): 2 via ORAL
  Filled 2017-09-27 (×2): qty 2

## 2017-09-27 MED ORDER — ACETAMINOPHEN 650 MG RE SUPP: 650.0000 mg | RECTAL | Status: DC | PRN

## 2017-09-27 MED ORDER — TRAZODONE HCL 50 MG PO TABS
50.0000 mg | ORAL_TABLET | Freq: Once | ORAL | Status: AC | PRN
  Filled 2017-09-27: qty 1

## 2017-09-27 MED ORDER — HYDROMORPHONE HCL 1 MG/ML IJ SOLN
INTRAMUSCULAR | Status: AC
Start: 2017-09-27 — End: 2017-09-27
  Administered 2017-09-27: 0.5 mg via INTRAVENOUS
  Filled 2017-09-27: qty 1

## 2017-09-27 MED ORDER — ONDANSETRON HCL 4 MG PO TABS: 4.0000 mg | ORAL_TABLET | Freq: Four times a day (QID) | ORAL | Status: DC | PRN

## 2017-09-27 MED ORDER — MORPHINE SULFATE (PF) 4 MG/ML IV SOLN: 4.0000 mg | INTRAVENOUS | Status: DC | PRN

## 2017-09-27 MED ORDER — ONDANSETRON HCL 4 MG/2ML IJ SOLN
INTRAMUSCULAR | Status: AC
  Filled 2017-09-27: qty 4

## 2017-09-27 MED ORDER — SODIUM CHLORIDE 0.9% FLUSH: 3.0000 mL | INTRAVENOUS | Status: DC | PRN

## 2017-09-27 MED ORDER — MAGNESIUM HYDROXIDE 400 MG/5ML PO SUSP: 30.0000 mL | Freq: Every day | ORAL | Status: DC | PRN

## 2017-09-27 MED ORDER — PHENYLEPHRINE 40 MCG/ML (10ML) SYRINGE FOR IV PUSH (FOR BLOOD PRESSURE SUPPORT)
PREFILLED_SYRINGE | INTRAVENOUS | Status: DC | PRN
  Administered 2017-09-27 (×2): 40 ug via INTRAVENOUS

## 2017-09-27 MED ORDER — SUCCINYLCHOLINE CHLORIDE 200 MG/10ML IV SOSY
PREFILLED_SYRINGE | INTRAVENOUS | Status: AC
  Filled 2017-09-27: qty 10

## 2017-09-27 MED ORDER — ALUM & MAG HYDROXIDE-SIMETH 200-200-20 MG/5ML PO SUSP: 30.0000 mL | Freq: Four times a day (QID) | ORAL | Status: DC | PRN

## 2017-09-27 MED ORDER — HYDROMORPHONE HCL 1 MG/ML IJ SOLN
0.2500 mg | INTRAMUSCULAR | Status: DC | PRN
  Administered 2017-09-27 (×3): 0.5 mg via INTRAVENOUS

## 2017-09-27 MED ORDER — FENTANYL CITRATE (PF) 100 MCG/2ML IJ SOLN
INTRAMUSCULAR | Status: DC | PRN
  Administered 2017-09-27 (×6): 50 ug via INTRAVENOUS
  Administered 2017-09-27 (×2): 25 ug via INTRAVENOUS
  Administered 2017-09-27 (×3): 50 ug via INTRAVENOUS

## 2017-09-27 MED ORDER — KCL IN DEXTROSE-NACL 20-5-0.45 MEQ/L-%-% IV SOLN
INTRAVENOUS | Status: DC
  Administered 2017-09-27: 100 mL/h via INTRAVENOUS

## 2017-09-27 MED ORDER — VANCOMYCIN HCL IN DEXTROSE 1-5 GM/200ML-% IV SOLN
1000.0000 mg | INTRAVENOUS | Status: AC
  Administered 2017-09-27: 1000 mg via INTRAVENOUS

## 2017-09-27 MED ORDER — SODIUM CHLORIDE 0.9% FLUSH
3.0000 mL | Freq: Two times a day (BID) | INTRAVENOUS | Status: DC
  Administered 2017-09-27 – 2017-09-28 (×4): 3 mL via INTRAVENOUS

## 2017-09-27 MED ORDER — BUPIVACAINE HCL (PF) 0.5 % IJ SOLN
INTRAMUSCULAR | Status: AC
  Filled 2017-09-27: qty 30

## 2017-09-27 MED ORDER — OXYCODONE HCL 5 MG PO TABS
5.0000 mg | ORAL_TABLET | ORAL | Status: DC | PRN
  Administered 2017-09-27: 5 mg via ORAL
  Administered 2017-09-27 – 2017-09-28 (×2): 10 mg via ORAL
  Filled 2017-09-27 (×2): qty 2

## 2017-09-27 MED ORDER — PROMETHAZINE HCL 25 MG/ML IJ SOLN: 6.2500 mg | INTRAMUSCULAR | Status: DC | PRN

## 2017-09-27 MED ORDER — HYDROMORPHONE HCL 1 MG/ML IJ SOLN
INTRAMUSCULAR | Status: AC
  Filled 2017-09-27: qty 1

## 2017-09-27 MED ORDER — PROPOFOL 10 MG/ML IV BOLUS
INTRAVENOUS | Status: DC | PRN
  Administered 2017-09-27: 100 mg via INTRAVENOUS

## 2017-09-27 MED ORDER — SUGAMMADEX SODIUM 200 MG/2ML IV SOLN
INTRAVENOUS | Status: AC
  Filled 2017-09-27: qty 2

## 2017-09-27 MED ORDER — PHENYLEPHRINE 40 MCG/ML (10ML) SYRINGE FOR IV PUSH (FOR BLOOD PRESSURE SUPPORT)
PREFILLED_SYRINGE | INTRAVENOUS | Status: AC
  Filled 2017-09-27: qty 10

## 2017-09-27 MED ORDER — THROMBIN (RECOMBINANT) 20000 UNITS EX SOLR
CUTANEOUS | Status: AC
  Filled 2017-09-27: qty 20000

## 2017-09-27 MED ORDER — ONDANSETRON HCL 4 MG/2ML IJ SOLN: 4.0000 mg | Freq: Four times a day (QID) | INTRAMUSCULAR | Status: DC | PRN

## 2017-09-27 MED ORDER — KETOROLAC TROMETHAMINE 30 MG/ML IJ SOLN
INTRAMUSCULAR | Status: AC
  Administered 2017-09-27: 15 mg via INTRAVENOUS
  Filled 2017-09-27: qty 1

## 2017-09-27 MED ORDER — ALBUMIN HUMAN 5 % IV SOLN
INTRAVENOUS | Status: DC | PRN
  Administered 2017-09-27: 10:00:00 via INTRAVENOUS

## 2017-09-27 MED ORDER — CARBIDOPA-LEVODOPA 25-100 MG PO TABS
3.0000 | ORAL_TABLET | ORAL | Status: DC
  Administered 2017-09-28 – 2017-09-29 (×2): 3 via ORAL
  Filled 2017-09-27 (×2): qty 3

## 2017-09-27 MED ORDER — ACETAMINOPHEN 10 MG/ML IV SOLN
INTRAVENOUS | Status: DC | PRN
  Administered 2017-09-27: 1000 mg via INTRAVENOUS

## 2017-09-27 MED ORDER — BUPIVACAINE HCL (PF) 0.5 % IJ SOLN
INTRAMUSCULAR | Status: DC | PRN
  Administered 2017-09-27: 5 mL

## 2017-09-27 MED ORDER — VITAMIN D 1000 UNITS PO TABS
5000.0000 [IU] | ORAL_TABLET | Freq: Two times a day (BID) | ORAL | Status: DC
  Administered 2017-09-28 (×2): 5000 [IU] via ORAL
  Filled 2017-09-27 (×2): qty 5

## 2017-09-27 MED ORDER — KETOROLAC TROMETHAMINE 30 MG/ML IJ SOLN
15.0000 mg | Freq: Four times a day (QID) | INTRAMUSCULAR | Status: DC
  Administered 2017-09-27 – 2017-09-29 (×7): 15 mg via INTRAVENOUS
  Filled 2017-09-27 (×7): qty 1

## 2017-09-27 MED ORDER — ONDANSETRON HCL 4 MG/2ML IJ SOLN
INTRAMUSCULAR | Status: DC | PRN
  Administered 2017-09-27 (×2): 4 mg via INTRAVENOUS

## 2017-09-27 MED ORDER — DEXAMETHASONE SODIUM PHOSPHATE 10 MG/ML IJ SOLN
INTRAMUSCULAR | Status: AC
  Filled 2017-09-27: qty 1

## 2017-09-27 MED ORDER — 0.9 % SODIUM CHLORIDE (POUR BTL) OPTIME
TOPICAL | Status: DC | PRN
  Administered 2017-09-27: 1000 mL

## 2017-09-27 MED ORDER — MENTHOL 3 MG MT LOZG: 1.0000 | LOZENGE | OROMUCOSAL | Status: DC | PRN

## 2017-09-27 MED ORDER — PROPOFOL 10 MG/ML IV BOLUS
INTRAVENOUS | Status: AC
  Filled 2017-09-27: qty 40

## 2017-09-27 MED ORDER — ROCURONIUM BROMIDE 10 MG/ML (PF) SYRINGE
PREFILLED_SYRINGE | INTRAVENOUS | Status: AC
  Filled 2017-09-27: qty 5

## 2017-09-27 MED ORDER — ACETAMINOPHEN 325 MG PO TABS: 650.0000 mg | ORAL_TABLET | ORAL | Status: DC | PRN

## 2017-09-27 MED ORDER — PRAMIPEXOLE DIHYDROCHLORIDE 1 MG PO TABS
1.0000 mg | ORAL_TABLET | Freq: Three times a day (TID) | ORAL | Status: DC
  Administered 2017-09-27 – 2017-09-28 (×4): 1 mg via ORAL
  Filled 2017-09-27 (×6): qty 1

## 2017-09-27 MED ORDER — ROCURONIUM BROMIDE 10 MG/ML (PF) SYRINGE
PREFILLED_SYRINGE | INTRAVENOUS | Status: DC | PRN
  Administered 2017-09-27: 20 mg via INTRAVENOUS
  Administered 2017-09-27: 10 mg via INTRAVENOUS
  Administered 2017-09-27: 20 mg via INTRAVENOUS
  Administered 2017-09-27: 50 mg via INTRAVENOUS
  Administered 2017-09-27: 10 mg via INTRAVENOUS

## 2017-09-27 MED ORDER — ACETAMINOPHEN 10 MG/ML IV SOLN
INTRAVENOUS | Status: AC
  Filled 2017-09-27: qty 100

## 2017-09-27 MED ORDER — DEXAMETHASONE SODIUM PHOSPHATE 10 MG/ML IJ SOLN
INTRAMUSCULAR | Status: DC | PRN
  Administered 2017-09-27: 10 mg via INTRAVENOUS

## 2017-09-27 MED ORDER — LACTATED RINGERS IV SOLN
INTRAVENOUS | Status: DC | PRN
  Administered 2017-09-27 (×2): via INTRAVENOUS

## 2017-09-27 MED ORDER — SUCCINYLCHOLINE CHLORIDE 200 MG/10ML IV SOSY
PREFILLED_SYRINGE | INTRAVENOUS | Status: DC | PRN
  Administered 2017-09-27: 100 mg via INTRAVENOUS

## 2017-09-27 MED ORDER — CARBIDOPA-LEVODOPA 25-100 MG PO TABS
1.0000 | ORAL_TABLET | Freq: Every day | ORAL | Status: DC
Start: 2017-09-27 — End: 2017-09-27

## 2017-09-27 MED ORDER — HYDROXYZINE HCL 25 MG PO TABS
50.0000 mg | ORAL_TABLET | ORAL | Status: DC | PRN
  Filled 2017-09-27: qty 1

## 2017-09-27 MED FILL — Heparin Sodium (Porcine) Inj 1000 Unit/ML: INTRAMUSCULAR | Qty: 30 | Status: AC

## 2017-09-27 MED FILL — Sodium Chloride IV Soln 0.9%: INTRAVENOUS | Qty: 1000 | Status: AC

## 2017-09-27 SURGICAL SUPPLY — 74 items
ADH SKN CLS APL DERMABOND .7 (GAUZE/BANDAGES/DRESSINGS) ×1
BAG DECANTER FOR FLEXI CONT (MISCELLANEOUS) ×2 IMPLANT
BLADE CLIPPER SURG (BLADE) IMPLANT
BUR ACRON 5.0MM COATED (BURR) ×2 IMPLANT
BUR MATCHSTICK NEURO 3.0 LAGG (BURR) ×2 IMPLANT
CANISTER SUCT 3000ML PPV (MISCELLANEOUS) ×2 IMPLANT
CAP LCK SPNE (Orthopedic Implant) ×9 IMPLANT
CAP LOCK SPINE RADIUS (Orthopedic Implant) IMPLANT
CAP LOCKING (Orthopedic Implant) ×18 IMPLANT
CARTRIDGE OIL MAESTRO DRILL (MISCELLANEOUS) ×1 IMPLANT
CONT SPEC 4OZ CLIKSEAL STRL BL (MISCELLANEOUS) ×2 IMPLANT
COVER BACK TABLE 60X90IN (DRAPES) ×2 IMPLANT
DERMABOND ADVANCED (GAUZE/BANDAGES/DRESSINGS) ×1
DERMABOND ADVANCED .7 DNX12 (GAUZE/BANDAGES/DRESSINGS) ×2 IMPLANT
DIFFUSER DRILL AIR PNEUMATIC (MISCELLANEOUS) ×2 IMPLANT
DRAPE C-ARM 42X72 X-RAY (DRAPES) ×4 IMPLANT
DRAPE C-ARMOR (DRAPES) ×1 IMPLANT
DRAPE HALF SHEET 40X57 (DRAPES) ×1 IMPLANT
DRAPE LAPAROTOMY 100X72X124 (DRAPES) ×2 IMPLANT
DRAPE POUCH INSTRU U-SHP 10X18 (DRAPES) ×2 IMPLANT
ELECT REM PT RETURN 9FT ADLT (ELECTROSURGICAL) ×2
ELECTRODE REM PT RTRN 9FT ADLT (ELECTROSURGICAL) ×1 IMPLANT
GAUZE SPONGE 4X4 12PLY STRL (GAUZE/BANDAGES/DRESSINGS) ×1 IMPLANT
GAUZE SPONGE 4X4 12PLY STRL LF (GAUZE/BANDAGES/DRESSINGS) ×1 IMPLANT
GAUZE SPONGE 4X4 16PLY XRAY LF (GAUZE/BANDAGES/DRESSINGS) IMPLANT
GLOVE BIOGEL PI IND STRL 7.0 (GLOVE) IMPLANT
GLOVE BIOGEL PI IND STRL 7.5 (GLOVE) IMPLANT
GLOVE BIOGEL PI IND STRL 8 (GLOVE) ×2 IMPLANT
GLOVE BIOGEL PI INDICATOR 7.0 (GLOVE) ×1
GLOVE BIOGEL PI INDICATOR 7.5 (GLOVE) ×2
GLOVE BIOGEL PI INDICATOR 8 (GLOVE) ×2
GLOVE ECLIPSE 7.5 STRL STRAW (GLOVE) ×4 IMPLANT
GLOVE SURG SS PI 7.5 STRL IVOR (GLOVE) ×2 IMPLANT
GOWN STRL REUS W/ TWL LRG LVL3 (GOWN DISPOSABLE) ×1 IMPLANT
GOWN STRL REUS W/ TWL XL LVL3 (GOWN DISPOSABLE) ×1 IMPLANT
GOWN STRL REUS W/TWL 2XL LVL3 (GOWN DISPOSABLE) IMPLANT
GOWN STRL REUS W/TWL LRG LVL3 (GOWN DISPOSABLE) ×2
GOWN STRL REUS W/TWL XL LVL3 (GOWN DISPOSABLE) ×4
KIT BASIN OR (CUSTOM PROCEDURE TRAY) ×2 IMPLANT
KIT INFUSE X SMALL 1.4CC (Orthopedic Implant) ×1 IMPLANT
KIT ROOM TURNOVER OR (KITS) ×2 IMPLANT
NDL ASP BONE MRW 8GX15 (NEEDLE) IMPLANT
NDL SPNL 18GX3.5 QUINCKE PK (NEEDLE) IMPLANT
NDL SPNL 22GX3.5 QUINCKE BK (NEEDLE) ×2 IMPLANT
NEEDLE ASP BONE MRW 8GX15 (NEEDLE) ×2 IMPLANT
NEEDLE SPNL 18GX3.5 QUINCKE PK (NEEDLE) ×2 IMPLANT
NEEDLE SPNL 22GX3.5 QUINCKE BK (NEEDLE) ×4 IMPLANT
NS IRRIG 1000ML POUR BTL (IV SOLUTION) ×2 IMPLANT
OIL CARTRIDGE MAESTRO DRILL (MISCELLANEOUS) ×2
PACK LAMINECTOMY NEURO (CUSTOM PROCEDURE TRAY) ×2 IMPLANT
PAD ARMBOARD 7.5X6 YLW CONV (MISCELLANEOUS) ×6 IMPLANT
PATTIES SURGICAL .5 X.5 (GAUZE/BANDAGES/DRESSINGS) IMPLANT
PATTIES SURGICAL .5 X1 (DISPOSABLE) ×1 IMPLANT
PATTIES SURGICAL 1X1 (DISPOSABLE) IMPLANT
ROD 120MM (Rod) ×4 IMPLANT
ROD SPNL 120X5.5XNS TI RDS (Rod) IMPLANT
SCREW 4.75X40MM (Screw) ×1 IMPLANT
SCREW RADIUS 4.0X40MM (Screw) ×2 IMPLANT
SCREW SPINAL 4.0X35MM TI CORT (Screw) ×1 IMPLANT
SPACER SPINAL 9X25X4-8D PEEK (Spacer) ×2 IMPLANT
SPONGE LAP 4X18 X RAY DECT (DISPOSABLE) ×1 IMPLANT
SPONGE NEURO XRAY DETECT 1X3 (DISPOSABLE) IMPLANT
SPONGE SURGIFOAM ABS GEL 100 (HEMOSTASIS) ×2 IMPLANT
STRIP BIOACTIVE VITOSS 25X100X (Neuro Prosthesis/Implant) ×2 IMPLANT
SUT VIC AB 1 CT1 18XBRD ANBCTR (SUTURE) ×2 IMPLANT
SUT VIC AB 1 CT1 8-18 (SUTURE) ×6
SUT VIC AB 2-0 CP2 18 (SUTURE) ×6 IMPLANT
SYR 3ML LL SCALE MARK (SYRINGE) IMPLANT
SYR CONTROL 10ML LL (SYRINGE) ×2 IMPLANT
TAPE CLOTH SURG 4X10 WHT LF (GAUZE/BANDAGES/DRESSINGS) ×1 IMPLANT
TOWEL GREEN STERILE (TOWEL DISPOSABLE) ×2 IMPLANT
TOWEL GREEN STERILE FF (TOWEL DISPOSABLE) ×2 IMPLANT
TRAY FOLEY W/METER SILVER 16FR (SET/KITS/TRAYS/PACK) ×2 IMPLANT
WATER STERILE IRR 1000ML POUR (IV SOLUTION) ×2 IMPLANT

## 2017-09-27 NOTE — Anesthesia Procedure Notes (Signed)
Procedure Name: Intubation Date/Time: 09/27/2017 7:36 AM Performed by: Everlean Cherry A Pre-anesthesia Checklist: Patient identified, Emergency Drugs available, Suction available and Patient being monitored Patient Re-evaluated:Patient Re-evaluated prior to induction Oxygen Delivery Method: Circle system utilized Preoxygenation: Pre-oxygenation with 100% oxygen Induction Type: IV induction, Rapid sequence and Cricoid Pressure applied Laryngoscope Size: Miller and 2 Grade View: Grade I Tube type: Oral Tube size: 7.0 mm Number of attempts: 1 Airway Equipment and Method: Stylet Placement Confirmation: ETT inserted through vocal cords under direct vision,  positive ETCO2 and breath sounds checked- equal and bilateral Secured at: 21 cm Tube secured with: Tape Dental Injury: Teeth and Oropharynx as per pre-operative assessment  Comments: RSI induction as patient complained of severe nausea.

## 2017-09-27 NOTE — Progress Notes (Signed)
Vitals:   09/27/17 1320 09/27/17 1345 09/27/17 1444 09/27/17 1648  BP: (!) 136/53 (!) 110/47 133/72 103/76  Pulse: 89 92 65 95  Resp: 14 11 16 16   Temp: (!) 97.5 F (36.4 C)  97.8 F (36.6 C) 98 F (36.7 C)  TempSrc:      SpO2: 97% 94% 95% 94%    Patient resting in bed, mild incisional discomfort, excellent relief of disabling right lumbar radicular pain. Lumbar dressing is clean and dry. Foley is to straight drainage.  However she's been having persistent vigorous dyskinetic movement of the upper and lower extremities. She awoke in the recovery room with this dyskinetic movement. She was given Flexeril (10 mg), Dilaudid (0.5 milligrams IV x 3 from 1230-1300), OxyIR 5 mg (at 1245), and 2 Sinemet (25/100) tabs (at 7544) while in the recovery room.  Since arrival on the floor the nursing staff reports that she rested some following the medications from the PACU, but since then has been having persistent dyskinetic movement. She's was given 2 additional Sinemet (25/100) tabs at 1530, as well as Mirapex 1 mg at 1700 and she was given Flexeril 5 mg and OxyIR 10 mg at 1930.  Plan: Doing well from a neurosurgical perspective, with good relief of disabling radicular pain, but now experiencing disabling persistent dyskinesia. I have consulted Dr. Roland Rack from neurology, requesting assistance in the management of this patient's parkinsonism and this disabling postoperative dyskinesia. I spoke with him by phone, and he will be coming by to see the patient. Her outpatient neurologist is Dr. Wells Guiles [name] from [facility] neurology.  Hosie Spangle, MD 09/27/2017, 8:05 PM

## 2017-09-27 NOTE — Anesthesia Postprocedure Evaluation (Signed)
Anesthesia Post Note  Patient: Alexandra Lindsey  Procedure(s) Performed: LUMBAR TWO- LUMBAR THREE LUMBAR DECOMPRESSION,POSTERIOR LUMBAR INTERBODY FUSION AND POSTERIOR LATERAL ARTHRODESIS (N/A Back)     Patient location during evaluation: PACU Anesthesia Type: General Level of consciousness: awake and alert, oriented and patient cooperative Pain management: pain level controlled Vital Signs Assessment: post-procedure vital signs reviewed and stable Respiratory status: spontaneous breathing, nonlabored ventilation, respiratory function stable and patient connected to nasal cannula oxygen Cardiovascular status: blood pressure returned to baseline and stable Postop Assessment: no apparent nausea or vomiting Anesthetic complications: no    Last Vitals:  Vitals:   09/27/17 1320 09/27/17 1345  BP: (!) 136/53 (!) 110/47  Pulse: 89 92  Resp: 14 11  Temp: (!) 36.4 C   SpO2: 97% 94%    Last Pain:  Vitals:   09/27/17 1320  TempSrc:   PainSc: Asleep                 Calirose Mccance,E. Siren Porrata

## 2017-09-27 NOTE — Consult Note (Signed)
Neurology Consultation Reason for Consult: Dyskinesia Referring Physician: Sherwood Gambler, R  CC: Dyskinesia  History is obtained from: Patient  HPI: Alexandra Lindsey is a 76 y.o. female with a history of Parkinson's disease who follows with Dr. Carles Collet.  She was admitted for lumbar surgery today which went well.  She states that the numbness in her right leg seems to have improved postoperatively.  Unfortunately, she has had severe dyskinesias since awakening from anesthesia.  These have been persistent, though slightly improved.  She denies history of dyskinesias.  Current regimen- -3 tablets at 5:00 am, 2 tablets at 9:00 am, 1 tablet at 1:00 pm, 2 tablets at 5:00 pm and one at night. -Take extra carbidopa/levodopa 25/100 prn  -Mirapex 1 mg tid  ROS: A 14 point ROS was performed and is negative except as noted in the HPI.   Past Medical History:  Diagnosis Date  . Anemia    as young girl and teenager  . Arthritis   . Chest pain, unspecified   . Constipation   . Costochondritis   . GERD (gastroesophageal reflux disease)   . Headache    ocular migraines  . History of hiatal hernia   . Hyperlipidemia 10-07-12  . Never smoked tobacco   . Parkinson's disease ([place]) 10-07-12  . Parkinson's disease ([place])   . Prediabetes   . 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 Spinal stenosis      Family History  Problem Relation Age of Onset  . Heart attack Mother   . Esophageal cancer Mother   . CVA Brother      Social History:  reports that she has never smoked. She has never used smokeless tobacco. She reports that she does not drink alcohol or use drugs.   Exam: Current vital signs: BP 116/84 (BP Location: Right Arm)   Pulse (!) 102   Temp 98.1 F (36.7 C) (Oral)   Resp 18   SpO2 94%  Vital signs in last 24 hours: Temp:  [97.5 F (36.4 C)-98.1 F (36.7 C)] 98.1 F (36.7 C) (11/01 2000) Pulse Rate:  [65-102] 102 (11/01 2000) Resp:  [11-23] 18 (11/01 2000) BP:  (100-179)/(47-84) 116/84 (11/01 2000) SpO2:  [91 %-97 %] 94 % (11/01 2000) FiO2 (%):  [2 %] 2 % (11/01 1648)   Physical Exam  Constitutional: Appears well-developed and well-nourished.  Psych: Affect appropriate to situation Eyes: No scleral injection HENT: No OP obstrucion Head: Normocephalic.  Cardiovascular: Normal rate and regular rhythm.  Respiratory: Effort normal and breath sounds normal to anterior ascultation GI: Soft.  No distension. There is no tenderness.  Skin: WDI  Neuro: Mental Status: Patient is awake, alert, oriented to person, place, month, year, and situation. Patient is able to give a clear and coherent history. No signs of aphasia or neglect Cranial Nerves: II: Visual Fields are full. Pupils are equal, round, and reactive to light.   III,IV, VI: EOMI without ptosis or diploplia.  V: Facial sensation is symmetric to temperature VII: Facial movement is symmetric.  VIII: hearing is intact to voice X: Uvula elevates symmetrically XI: Shoulder shrug is symmetric. XII: tongue is midline without atrophy or fasciculations.  Motor: She has severe dyskinesias throughout my exam, precluding tone examination, she has good strength throughout. Sensory: Sensation is symmetric to light touch and temperature in the arms and legs. Cerebellar: No clear ataxia, but she has dyskinesias during exam.  I have reviewed labs in epic and the results pertinent to this consultation are: BMP-unremarkable  Impression: 76 year old female with postoperative dyskinesias.  Propofol has been described as causing dyskinesias and Parkinson's disease, but this typically improves after cessation.  I advised that I would favor benzodiazepines for sedation and see how she is doing tomorrow, but she was hesitant to take this given bad reactions her mother had in the past.  Recommendations: 1) Lorazepam 1 mg to assist with sleep, available as as needed if patient decides to take it 2)  alternatively, could try trazodone but I suspect this will not be as beneficial. 3) could discuss with Dr. Carles Collet if her symptoms remain tomorrow.  Roland Rack, MD [facility] [phone]  If 7pm- 7am, please page neurology on call as listed in [place].

## 2017-09-27 NOTE — Transfer of Care (Signed)
Immediate Anesthesia Transfer of Care Note  Patient: Alexandra Lindsey  Procedure(s) Performed: LUMBAR TWO- LUMBAR THREE LUMBAR DECOMPRESSION,POSTERIOR LUMBAR INTERBODY FUSION AND POSTERIOR LATERAL ARTHRODESIS (N/A Back)  Patient Location: PACU  Anesthesia Type:General  Level of Consciousness: awake, alert , oriented and patient cooperative  Airway & Oxygen Therapy: Patient Spontanous Breathing and Patient connected to nasal cannula oxygen  Post-op Assessment: Report given to RN, Post -op Vital signs reviewed and stable and Patient moving all extremities X 4  Post vital signs: Reviewed and stable  Last Vitals:  Vitals:   09/27/17 0606 09/27/17 1205  BP: 100/76   Pulse: 79   Resp: 18   Temp: 36.4 C (!) 36.4 C    Last Pain:  Vitals:   09/27/17 0606  TempSrc: Oral  PainSc:          Complications: No apparent anesthesia complications

## 2017-09-27 NOTE — Anesthesia Preprocedure Evaluation (Signed)
Anesthesia Evaluation  Patient identified by MRN, date of birth, ID band Patient awake    Reviewed: Allergy & Precautions, NPO status , Patient's Chart, lab work & pertinent test results  History of Anesthesia Complications Negative for: history of anesthetic complications  Airway Mallampati: II  TM Distance: >3 FB Neck ROM: Full    Dental  (+) Dental Advisory Given, Caps   Pulmonary neg pulmonary ROS,    breath sounds clear to auscultation       Cardiovascular (-) CAD  Rhythm:Regular Rate:Normal  '13 Cath normal   Neuro/Psych  Headaches, Parkinson's    GI/Hepatic Neg liver ROS, GERD  Controlled,  Endo/Other  negative endocrine ROS  Renal/GU negative Renal ROS     Musculoskeletal  (+) Arthritis ,   Abdominal   Peds  Hematology negative hematology ROS (+)   Anesthesia Other Findings   Reproductive/Obstetrics                             Anesthesia Physical Anesthesia Plan  ASA: III  Anesthesia Plan: General   Post-op Pain Management:    Induction: Intravenous  PONV Risk Score and Plan: 4 or greater and Ondansetron, Dexamethasone, Midazolam and Treatment may vary due to age or medical condition  Airway Management Planned: Oral ETT  Additional Equipment:   Intra-op Plan:   Post-operative Plan: Extubation in OR  Informed Consent: I have reviewed the patients History and Physical, chart, labs and discussed the procedure including the risks, benefits and alternatives for the proposed anesthesia with the patient or authorized representative who has indicated his/her understanding and acceptance.   Dental advisory given  Plan Discussed with: CRNA and Surgeon  Anesthesia Plan Comments: (Plan routine monitors, GETA)        Anesthesia Quick Evaluation

## 2017-09-27 NOTE — H&P (Signed)
Subjective: Patient is a 76 y.o. white female who is admitted for treatment of advanced degeneration with canal and neural foraminal stenosis at the L2-3 level. Patient is new to year status post a L3-L5 lumbar decompression and arthrodesis in January 2017. It was complicated by a CSF leak that required reoperation. Recently has had increasing difficulties with disabling right lumbar radicular pain, and MRI scan shows progressive degenerative disease and spondylosis with circumferential disc bulging at the L2-3 level with canal and particular right L2-3 neural foraminal stenosis. She has been treated with ibuprofen and gabapentin, but has been requiring increasing amounts of oxycodone for pain relief for her disabling right lumbar radiculopathy.  Patient is admitted now for a L2-3 decompressive lumbar laminectomy, facetectomies, and foraminotomies, with the bilateral L2-3 stabilization via posterior lumbar interbody arthrodesis with interbody implants and bone graft and the bilateral L2-3 posterior lateral arthrodesis with posterior instrumentation and bone graft. The posterior instrumentation will be tied into the existing L3-L5 posterior instrumentation.   Patient Active Problem List   Diagnosis Date Noted  . Positive colorectal cancer screening using Cologuard test 03/26/2017  . 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 Lumbar radiculopathy 09/13/2015  . Greater trochanteric bursitis of right hip 08/04/2015  . Greater trochanteric bursitis of left hip 02/17/2015  . Piriformis syndrome of left side 11/05/2014  . SI (sacroiliac) joint dysfunction 11/05/2014  . Vitamin D deficiency 09/06/2014  . Medication management 09/06/2014  . Hypertension   . GERD (gastroesophageal reflux disease)   . Prediabetes   . 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 Parkinson's disease ([place])   . 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 Hyperlipidemia 10/07/2012    Past Medical History:  Diagnosis Date  . Anemia    as young girl and teenager  . Arthritis   . Chest pain, unspecified   . Constipation   . Costochondritis   . GERD (gastroesophageal reflux disease)   . Headache    ocular migraines  . History of hiatal hernia   . Hyperlipidemia 10-07-12  . Never smoked tobacco   . Parkinson's disease ([place]) 10-07-12  . Parkinson's disease ([place])   . Prediabetes   . 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 Spinal stenosis     Past Surgical History:  Procedure Laterality Date  . ABDOMINAL HYSTERECTOMY    . APPENDECTOMY    . BACK SURGERY  12/20/2015  . BUNIONECTOMY Bilateral 11-11-3  . CARDIAC CATHETERIZATION Bilateral 2013  . COLONOSCOPY    . HERNIA REPAIR    . LEFT HEART CATHETERIZATION WITH CORONARY ANGIOGRAM N/A 07/03/2012   Procedure: LEFT HEART CATHETERIZATION WITH CORONARY ANGIOGRAM;  Surgeon: Troy Sine, MD;  Location: [facility] CATH LAB;  Service: Cardiovascular;  Laterality: N/A;  . REPAIR OF CEREBROSPINAL FLUID LEAK N/A 12/24/2015   Procedure: LUMBAR WOUND EXPLORATION, REPAIR OF CEREBROSPINAL FLUID LEAK, PLACEMENT OF LUMBAR DRAIN;  Surgeon: Kevan Ny Ditty, MD;  Location: [facility] NEURO ORS;  Service: Neurosurgery;  Laterality: N/A;  . ROTATOR CUFF REPAIR Left 10-07-12    Prescriptions Prior to Admission  Medication Sig Dispense Refill Last Dose  . acetaminophen (TYLENOL) 500 MG tablet Take 1,000 mg by mouth 2 (two) times daily as needed for moderate pain or headache.   09/26/2017 at Unknown time  . carbidopa-levodopa (SINEMET IR) 25-100 MG tablet 3 tablets at 5:00 am, 2 tablets at 9:00 am, 1 tablet at 1:00 pm, 2 tablets at 5:00 pm and one at night, extra PRN (Patient taking differently:  3 tablets at 5:00 am, 2 tablets at 9:00 am, 1 tablet at 1:00 pm, 2 tablets at 5:00 pm and one at night as needed for Parkinson's) 900 tablet 1 09/27/2017 at Unknown time  . Cholecalciferol (VITAMIN D3) 5000 units CAPS Take 5,000 Units by mouth 2 (two) times  daily.   Past Week at Unknown time  . hydrocortisone cream 1 % Apply 1 application topically daily as needed for itching.   Past Week at Unknown time  . Menthol, Topical Analgesic, (BLUE GEL EX) Apply 1 application topically 3 (three) times daily as needed (muscle pain).   09/26/2017 at Unknown time  . neomycin-bacitracin-polymyxin (NEOSPORIN) ointment Apply 1 application topically as needed for wound care.   Past Week at Unknown time  . pramipexole (MIRAPEX) 1 MG tablet TAKE 1 TABLET BY MOUTH 3 TIMES DAILY 270 tablet 1 09/27/2017 at Unknown time  . diclofenac sodium (VOLTAREN) 1 % GEL Apply 2 g topically 4 (four) times daily. (Patient not taking: Reported on 09/19/2017) 1 Tube 1 Not Taking at Unknown time  . diphenhydrAMINE (BENADRYL) 25 MG tablet Take 25 mg by mouth daily as needed for allergies.   Unknown at Unknown time  . ranitidine (ZANTAC) 150 MG tablet Take 150 mg by mouth daily as needed for heartburn.    Unknown at Unknown time  . sodium chloride (OCEAN) 0.65 % SOLN nasal spray Place 1 spray into both nostrils as needed for congestion.   Unknown at Unknown time  . Tetrahydrozoline HCl (VISINE OP) Apply 1 drop to eye daily as needed (dry eyes).   Unknown at Unknown time  . vitamin B-12 (CYANOCOBALAMIN) 1000 MCG tablet Take 1,000 mcg by mouth 2 (two) times a week.    Unknown at Unknown time   Allergies  Allergen Reactions  . Macrobid [Nitrofurantoin Macrocrystal] Swelling and Rash  . Baclofen Other (See Comments)    Kept her awake, affected memory  . Diclofenac     The gel caused a rash  . Cephalosporins Other (See Comments)    Cannot sleep  . Codeine Nausea Only    dizziness  . Darvocet [Propoxyphene N-Acetaminophen] Nausea And Vomiting  . Levaquin [Levofloxacin] Hives  . Penicillins Rash  . Selegiline Hcl Rash  . Tramadol Nausea And Vomiting  . Triamcinolone Itching    Social History  Substance Use Topics  . Smoking status: Never Smoker  . Smokeless tobacco: Never Used  .  Alcohol use No    Family History  Problem Relation Age of Onset  . Heart attack Mother   . Esophageal cancer Mother   . CVA Brother      Review of Systems Pertinent items noted in HPI and remainder of comprehensive ROS otherwise negative.  Objective: Vital signs in last 24 hours: Temp:  [97.6 F (36.4 C)] 97.6 F (36.4 C) (11/01 0606) Pulse Rate:  [79] 79 (11/01 0606) Resp:  [18] 18 (11/01 0606) BP: (100)/(76) 100/76 (11/01 0606)  EXAM:  Patient is a well-developed white female in discomfort, but no acute distress. Lungs are clear to auscultation , the patient has symmetrical respiratory excursion. Heart has a regular rate and rhythm normal S1 and S2 no murmur.   Abdomen is soft nontender nondistended bowel sounds are present. Extremity examination shows no clubbing cyanosis or edema. Motor examination shows 5 over 5 strength in the lower extremities including the iliopsoas quadriceps dorsiflexor extensor hallicus  longus and plantar flexor bilaterally. Sensation is intact to pinprick in the distal lower extremities. Reflexes are  symmetrical bilaterally. No pathologic reflexes are present. Patient has a normal gait and stance.   Data Review:CBC    Component Value Date/Time   WBC 7.0 09/21/2017 1038   RBC 4.06 09/21/2017 1038   HGB 11.9 (L) 09/21/2017 1038   HCT 37.1 09/21/2017 1038   PLT 252 09/21/2017 1038   MCV 91.4 09/21/2017 1038   MCH 29.3 09/21/2017 1038   MCHC 32.1 09/21/2017 1038   RDW 13.2 09/21/2017 1038   LYMPHSABS 1,728 11/17/2016 1154   MONOABS 576 11/17/2016 1154   EOSABS 128 11/17/2016 1154   BASOSABS 0 11/17/2016 1154                          BMET    Component Value Date/Time   NA 139 09/21/2017 1038   K 3.9 09/21/2017 1038   CL 108 09/21/2017 1038   CO2 24 09/21/2017 1038   GLUCOSE 98 09/21/2017 1038   BUN 25 (H) 09/21/2017 1038   CREATININE 0.87 09/21/2017 1038   CREATININE 0.79 11/17/2016 1154   CALCIUM 8.9 09/21/2017 1038   GFRNONAA >60  09/21/2017 1038   GFRNONAA 74 11/17/2016 1154   GFRAA >60 09/21/2017 1038   GFRAA 85 11/17/2016 1154     Assessment/Plan: Patient with increasing low back and right lumbar radicular pain secondary to progressive degeneration at the L2-3 level. She status post previous L3-L5 decompression and arthrodesis. She is admitted now for decompression arthrodesis at the L2-3 level.  I've discussed with the patient the nature of his condition, the nature the surgical procedure, the typical length of surgery, hospital stay, and overall recuperation, the limitations postoperatively, and risks of surgery. I discussed risks including risks of infection, bleeding, possibly need for transfusion, the risk of nerve root dysfunction with pain, weakness, numbness, or paresthesias, the risk of dural tear and CSF leakage and possible need for further surgery, the risk of failure of the arthrodesis and possibly for further surgery, the risk of anesthetic complications including myocardial infarction, stroke, pneumonia, and death. We discussed the need for postoperative immobilization in a lumbar brace. Understanding all this the patient does wish to proceed with surgery and is admitted for such.     Hosie Spangle, MD 09/27/2017 7:08 AM

## 2017-09-27 NOTE — Op Note (Signed)
09/27/2017  11:55 AM  PATIENT:  Alexandra Lindsey  76 y.o. 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, lumbar instability, lumbar spondylosis, lumbar degenerative disease, right lumbar radiculopathy  PROCEDURE:  Procedure(s):  Bilateral L2 and L3 decompressive lumbar laminectomy with bilateral L2-3 facetectomy and foraminotomies for decompression of the stenotic compression of the exiting L2 and L3 nerve roots bilaterally, with decompression beyond that required for interbody arthrodesis; bilateral L2-3 posterior lumbar interbody arthrodesis with AVS peek interbody implants, Vitoss BA with bone marrow aspirate, and infuse; right L1-L3 and left L1-L4 posterior lateral arthrodesis with segmental radius posterior instrumentation, Vitoss BA with bone marrow aspirate, and infuse  SURGEON:  Jovita Gamma, M.D.  ASSISTANTS: Sherley Bounds, M.D.  ANESTHESIA:   general  EBL:  Total I/O In: 1250 [I.V.:1000; IV Piggyback:250] Out: 715 [Urine:415; Blood:300]  BLOOD ADMINISTERED:none  CELL SAVER GIVEN: Cell Saver technician felt that there was insufficient blood loss to process the collected blood.  COUNT: Correct per nursing staff  DICTATION: Patient is brought to the operating room placed under general endotracheal anesthesia. The patient was turned to prone position the lumbar region was prepped with Betadine soap and solution and draped in a sterile fashion. The midline was infiltrated with local anesthesia with epinephrine. A localizing x-ray was taken and then a midline incision was made through the previous midline skin incision, and extended rostrally. It was carried down through the subcutaneous tissue, bipolar cautery and electrocautery were used to maintain hemostasis. Dissection  was carried down to the lumbar fascia. The fascia was incised bilaterally and the paraspinal muscles were dissected from the with a spinous process and lamina in a subperiosteal fashion at the L1-2 and L2-3 levels. Another x-ray was taken for localization and the L1, L2, and L3 levels were localized. The dissection was then extended caudally and the spinous processes and lamina of L5 were exposed. We then were able to dissect laterally, exposing the existing instrumentation on the right at L3, L4, and L5 and on the left at L4 and L5. We then unlock the existing locking caps, and there were removed. We then removed the existing rods. Scar tissue and bony overgrowth around the screw heads were cleaned away.  We then turned our attention to the L2-3 level. Bilateral L2 and L3 laminectomies were performed, along with bilateral L2-3 facetectomies. The thecal sac was identified, there was markedly thickened ligament flavum, worse on the right than the left, with thecal sac and nerve root compression. The ligament flavum was gradually mobilize and removed decompressing the thecal sac. We then identified the exiting L2 and L3 nerve roots and spondylitic overgrowth was carefully mobilized and removed, decompressing the stenotic compression of the exiting L2 and L3 nerve roots.  Dissection was then carried out laterally over the facet complex and the transverse processes of L2 and L3 were exposed and decorticated. Once the decompression of the stenotic compression of the thecal sac and exiting nerve roots was completed we proceeded with the posterior lumbar interbody arthrodesis. The annulus was incised bilaterally and the disc space entered. A thorough discectomy was performed using pituitary rongeurs and curettes. Once the discectomy was completed we began to prepare the endplate surfaces removing the cartilaginous endplates surface. We then measured the height of the intervertebral disc space. We selected 9 x 25 x 4  AVS peek interbody implants.  The  C-arm fluoroscope was then draped and brought in the field and we identified the pedicle entry points bilaterally at the L2 level. Both pedicles were quite small, right being smaller than left. Each of the pedicles was probed using C-arm fluoroscopy in AP and lateral projections. We aspirated bone marrow aspirate from the vertebral bodies, this was injected over two 10 cc strips of Vitoss BA. Then each of the pedicles was examined with the ball probe good bony surfaces were found. Each of the pedicles was then tapped with a 3.5 mm tap, again examined with the ball probe good threading was found. We then placed a 4 mm screws bilaterally at L2, with a 40 mm screw on the left and a 35 mm screw on the right. Because of the relatively small stature of the screws at L2, it was decided to place screws at L1. Pedicle entry points were identified bilaterally at the L1 level. Again each of the pedicles was quite small. Each of the pedicles was probed using C-arm fluoroscopy in AP and lateral projections. The pedicles were examined with a ball probe, good bony surfaces were found. Each of the pedicles was then tapped with a 3.5 mm tap, and again examined with the ball probe, and good threading was found. We placed a 4.75 x 40 mm screw on the left and a 4 x 40 mm screw on the right.  We then packed the AVS peek interbody implants with Vitoss BA with bone marrow aspirate and infuse, and then placed the first implant and on the right side, carefully retracting the thecal sac and nerve root medially. We then went back to the left side and packed the midline with additional Vitoss BA with bone marrow aspirate and infuse, and then placed a second implant and on the left side again retracting the thecal sac and nerve root medially. Additional Vitoss BA with bone marrow aspirate was packed lateral to the implants.  We then packed the lateral gutter over the transverse processes of L1, L2, and L3  on the right, and the transverse processes of L1, L2, L3, and L4 on the left, along with the intertransverse space with Vitoss BA with bone marrow aspirate and infuse. We then selected 120 mm pre-lordosed rods, they were placed within the screw heads on the right at L1, L2, L3, L4, and L5 and on the left at L1, L2, L4, and L5 on the left. They were secured with locking caps. Once all 9 locking caps were placed final tightening was performed against a counter torque.  The wound had been irrigated multiple times during the procedure with saline solution and bacitracin solution, good hemostasis was established with a combination of bipolar cautery and Gelfoam with thrombin. Once good hemostasis was confirmed we proceeded with closure paraspinal muscles deep fascia and Scarpa's fascia were closed with interrupted undyed 1 Vicryl sutures the subcutaneous and subcuticular closed with interrupted inverted 2-0 undyed Vicryl sutures the skin edges were approximated with Dermabond. The wound was dressed with sterile gauze and Hypafix.  Following surgery the patient was turned back to the supine position to be reversed and the anesthetic extubated and transferred to the recovery room for further care.  PLAN OF CARE: Admit to inpatient   PATIENT DISPOSITION:  PACU - hemodynamically stable.   Delay start of Pharmacological VTE agent (>24hrs) due to surgical blood loss or risk of bleeding:  yes

## 2017-09-28 DIAGNOSIS — G249 Dystonia, unspecified: Secondary | ICD-10-CM

## 2017-09-28 MED ORDER — LORAZEPAM 2 MG/ML IJ SOLN
0.5000 mg | INTRAMUSCULAR | Status: DC | PRN
  Administered 2017-09-28: 0.5 mg via INTRAVENOUS
  Filled 2017-09-28: qty 1

## 2017-09-28 MED ORDER — HYDROCODONE-ACETAMINOPHEN 5-325 MG PO TABS
1.0000 | ORAL_TABLET | ORAL | Status: DC | PRN
  Administered 2017-09-28: 1 via ORAL
  Administered 2017-09-28 – 2017-09-29 (×3): 2 via ORAL
  Filled 2017-09-28 (×3): qty 2
  Filled 2017-09-28: qty 1

## 2017-09-28 MED ORDER — LORAZEPAM BOLUS VIA INFUSION: 0.5000 mg | INTRAVENOUS | Status: DC | PRN

## 2017-09-28 MED ORDER — OXYCODONE HCL 5 MG PO TABS: 5.0000 mg | ORAL_TABLET | ORAL | Status: DC | PRN

## 2017-09-28 NOTE — Plan of Care (Signed)
Problem: Physical Regulation: Goal: Postoperative complications will be avoided or minimized Outcome: Progressing No dyskinesia so far this shift. Patient is drowsy but rousable, states pain has been a 6 at the incision site on a 1-10 scale despite medication. Repositioning helps some. Continuing to monitor.

## 2017-09-28 NOTE — Progress Notes (Signed)
Patient arrived from [facility], vitals stable, patient/family oriented to room. Questions answered. Continue to monitor patient.

## 2017-09-28 NOTE — Evaluation (Signed)
Physical Therapy Evaluation Patient Details Name: Alexandra Lindsey MRN: [id number] DOB: 07/07/1941 Today's Date: 09/28/2017   History of Present Illness  Patient is a 76 y/o female who presents s/p L2-3 PLIF and decompression. Pt with postoperative dyskinesias from propofol. PMH includes prior back surgeries, PD, prediabetes, HLD.   Clinical Impression  Patient presents with pain, post surgical deficits and dyskinesias s/p above impacting mobility. Tolerated gait training with Mod A of 2 for balance/safety as pt with dyskinesias throughout trunk, neck and extremities impacting balance and putting pt at increased risk for falls. Pt from home with spouse who has been assisting with all ADLs and ambulation. Pt with multiple falls at home. Pt also with poor safety awareness, impaired memory, impaired attention and impulsivity most likely due to above. Not able to educate pt on back precautions due to confusion. Awaiting anesthesia to clear from system. Spouse plans to take pt home at d/c however would benefit from maximizing home health services - HHPT, OT, RN, aide. Will follow acutely to maximize independence and mobility prior to return home.     Follow Up Recommendations Home health PT;Supervision for mobility/OOB;Supervision/Assistance - 24 hour    Equipment Recommendations  None recommended by PT    Recommendations for Other Services       Precautions / Restrictions Precautions Precautions: Back Precaution Booklet Issued: Yes (comment) Precaution Comments: However pt unable to comprehend at this time. Required Braces or Orthoses: Spinal Brace Spinal Brace: Lumbar corset;Applied in sitting position Restrictions Weight Bearing Restrictions: No      Mobility  Bed Mobility Overal bed mobility: Needs Assistance Bed Mobility: Rolling;Sidelying to Sit;Sit to Sidelying Rolling: Min assist Sidelying to sit: Mod assist;HOB elevated     Sit to sidelying: Min guard;HOB elevated General  bed mobility comments: Manual cues for log roll technique; assist with LEs and to elevate trunk. Able to bring LEs into bed. Assist to reposition.  Transfers Overall transfer level: Needs assistance Equipment used: Rolling walker (2 wheeled) Transfers: Sit to/from Stand Sit to Stand: Mod assist;+2 physical assistance         General transfer comment: Assist of 2 to stand from EOB with cues for upright; pt with dyskinesias throughout trunk and LEs.  Ambulation/Gait Ambulation/Gait assistance: Mod assist;+2 physical assistance Ambulation Distance (Feet): 200 Feet Assistive device: Rolling walker (2 wheeled) Gait Pattern/deviations: Step-through pattern;Decreased step length - right;Decreased step length - left;Decreased stride length;Scissoring;Ataxic;Staggering right;Staggering left;Narrow base of support;Drifts right/left;Trunk flexed Gait velocity: decreased   General Gait Details: Slow, staggering like gait with scissoring and narrow BoS. Takes UE off walker at times requiring more assist; Dyskinesias noted throughout trunk and LEs.  Stairs            Wheelchair Mobility    Modified Rankin (Stroke Patients Only)       Balance Overall balance assessment: Needs assistance Sitting-balance support: Feet supported;Bilateral upper extremity supported Sitting balance-Leahy Scale: Fair Sitting balance - Comments: Requires Min guard assist for sitting balance,. total A to donn LSO.   Standing balance support: During functional activity;Bilateral upper extremity supported Standing balance-Leahy Scale: Poor Standing balance comment: Reliant on UEs for support in standing and external support due to dykinesias.                             Pertinent Vitals/Pain Pain Assessment: Faces Faces Pain Scale: Hurts little more Pain Location: back Pain Descriptors / Indicators: Sore;Operative site guarding;Grimacing Pain Intervention(s): Monitored during  session;Repositioned;Limited activity within patient's tolerance    Home Living Family/patient expects to be discharged to:: Private residence Living Arrangements: Spouse/significant other Available Help at Discharge: Family;Available 24 hours/day Type of Home: House Home Access: Stairs to enter Entrance Stairs-Rails: None Entrance Stairs-Number of Steps: 1 Home Layout: One level Home Equipment: Walker - 2 wheels;Walker - 4 wheels;Walker - standard;Cane - single point;Toilet riser      Prior Function Level of Independence: Needs assistance   Gait / Transfers Assistance Needed: Pt assists with ambulation using rollator; reports mulitple falls.  ADL's / Homemaking Assistance Needed: Husband assists witha ll care.         Hand Dominance   Dominant Hand: Right    Extremity/Trunk Assessment   Upper Extremity Assessment Upper Extremity Assessment: Defer to OT evaluation    Lower Extremity Assessment Lower Extremity Assessment: Generalized weakness    Cervical / Trunk Assessment Cervical / Trunk Assessment: Other exceptions Cervical / Trunk Exceptions: dyskinesias in cervical spine  Communication   Communication: Expressive difficulties  Cognition Arousal/Alertness: Awake/alert Behavior During Therapy: Impulsive Overall Cognitive Status: Impaired/Different from baseline Area of Impairment: Orientation;Attention;Memory;Following commands;Safety/judgement;Awareness                 Orientation Level: Disoriented to;Place;Time;Situation Current Attention Level: Focused Memory: Decreased short-term memory;Decreased recall of precautions Following Commands: Follows one step commands with increased time;Follows multi-step commands inconsistently Safety/Judgement: Decreased awareness of safety;Decreased awareness of deficits Awareness: Intellectual   General Comments: Pt very confused. Mumbled speech. Follows 1-2 step commands with repetition. Impulsive with all  movement. Some dyskinesias noted during mobility. A&0x1- thinks she is at [employer].      General Comments General comments (skin integrity, edema, etc.): Spouse and 2 friends present.    Exercises     Assessment/Plan    PT Assessment Patient needs continued PT services  PT Problem List Decreased mobility;Decreased safety awareness;Decreased knowledge of precautions;Decreased coordination;Decreased activity tolerance;Decreased cognition;Pain;Decreased balance;Decreased knowledge of use of DME       PT Treatment Interventions Therapeutic activities;Gait training;Therapeutic exercise;Patient/family education;Balance training;Functional mobility training;DME instruction;Cognitive remediation    PT Goals (Current goals can be found in the Care Plan section)  Acute Rehab PT Goals Patient Stated Goal: spouse would like pt to come home PT Goal Formulation: With family Time For Goal Achievement: 10/12/17 Potential to Achieve Goals: Fair    Frequency Min 5X/week   Barriers to discharge        Co-evaluation               AM-PAC PT "6 Clicks" Daily Activity  Outcome Measure Difficulty turning over in bed (including adjusting bedclothes, sheets and blankets)?: Unable Difficulty moving from lying on back to sitting on the side of the bed? : Unable Difficulty sitting down on and standing up from a chair with arms (e.g., wheelchair, bedside commode, etc,.)?: Unable Help needed moving to and from a bed to chair (including a wheelchair)?: A Lot Help needed walking in hospital room?: A Lot Help needed climbing 3-5 steps with a railing? : Total 6 Click Score: 8    End of Session Equipment Utilized During Treatment: Back brace;Gait belt Activity Tolerance: Patient tolerated treatment well Patient left: in bed;with call bell/phone within reach;with family/visitor present;with bed alarm set Nurse Communication: Mobility status PT Visit Diagnosis: Unsteadiness on feet  (R26.81);Muscle weakness (generalized) (M62.81);Pain;Difficulty in walking, not elsewhere classified (R26.2);History of falling (Z91.81);Other symptoms and signs involving the nervous system (R29.898) Pain - part of body:  (back)  Time: 5797-2820 PT Time Calculation (min) (ACUTE ONLY): 23 min   Charges:   PT Evaluation $PT Eval Moderate Complexity: 1 Mod PT Treatments $Gait Training: 8-22 mins   PT G Codes:        Wray Kearns, PT, DPT [phone]    Marguarite Arbour A Floetta Brickey 09/28/2017, 3:43 PM

## 2017-09-28 NOTE — Progress Notes (Signed)
Neurology Progress Note   S:// Patient seen and examined.  Dyskinesias improving slowly but significantly improved since yesterday.  Continues to have dyskinetic movement of all 4 limbs and neck muscles at times.  O:// Current vital signs: BP (!) 135/96 (BP Location: Right Arm)   Pulse (!) 55   Temp 97.8 F (36.6 C) (Oral)   Resp 18   SpO2 92%  Vital signs in last 24 hours: Temp:  [97.5 F (36.4 C)-99 F (37.2 C)] 97.8 F (36.6 C) (11/02 0820) Pulse Rate:  [55-103] 55 (11/02 0820) Resp:  [11-23] 18 (11/02 0820) BP: (103-179)/(47-96) 135/96 (11/02 0820) SpO2:  [91 %-97 %] 92 % (11/02 0820) FiO2 (%):  [2 %] 2 % (11/01 1648) GENERAL: Awake, alert in NAD HEENT: - Normocephalic and atraumatic, dry mm, no LN++, no Thyromegally LUNGS - Clear to auscultation bilaterally with no wheezes CV - S1S2 RRR, no m/r/g, equal pulses bilaterally. ABDOMEN - Soft, nontender, nondistended with normoactive BS Ext: warm, well perfused, intact peripheral pulses, no edema NEURO:  Mental Status: AA&Ox3  Language: speech is clear.  Naming, repetition, fluency, and comprehension intact. Cranial Nerves: PERRL 85mm/brisk. EOMI, visual fields full, no facial asymmetry facial sensation intact, hearing intact, tongue/uvula/soft palate midline, normal sternocleidomastoid and trapezius muscle strength. No evidence of tongue atrophy or fibrillations Motor: 5/5 all 4 extremities with continuous dyskinetic movements, which based on patient history and chart review seem to be improving since last neurological evaluation.  No bradykinesia Tone: is essentially normal all over Sensation- Intact to light touch bilaterally Coordination: Finger to chin shows mild action tremor worse on the right and left Gait- deferred  Medications  Current Facility-Administered Medications:  .  acetaminophen (TYLENOL) tablet 650 mg, 650 mg, Oral, Q4H PRN **OR** acetaminophen (TYLENOL) suppository 650 mg, 650 mg, Rectal, Q4H PRN,  Jovita Gamma, MD .  alum & mag hydroxide-simeth (MAALOX/MYLANTA) 200-200-20 MG/5ML suspension 30 mL, 30 mL, Oral, Q6H PRN, Jovita Gamma, MD .  bisacodyl (DULCOLAX) suppository 10 mg, 10 mg, Rectal, Daily PRN, Jovita Gamma, MD .  carbidopa-levodopa (SINEMET IR) 25-100 MG per tablet immediate release 1 tablet, 1 tablet, Oral, Daily, Jovita Gamma, MD .  carbidopa-levodopa (SINEMET IR) 25-100 MG per tablet immediate release 1 tablet, 1 tablet, Oral, QHS PRN, Jovita Gamma, MD .  carbidopa-levodopa (SINEMET IR) 25-100 MG per tablet immediate release 2 tablet, 2 tablet, Oral, Daily, Jovita Gamma, MD, 2 tablet at 09/28/17 0900 .  carbidopa-levodopa (SINEMET IR) 25-100 MG per tablet immediate release 2 tablet, 2 tablet, Oral, QAC supper, Jovita Gamma, MD, 2 tablet at 09/27/17 1529 .  carbidopa-levodopa (SINEMET IR) 25-100 MG per tablet immediate release 3 tablet, 3 tablet, Oral, Daily, Jovita Gamma, MD, 3 tablet at 09/28/17 0451 .  cholecalciferol (VITAMIN D) tablet 5,000 Units, 5,000 Units, Oral, BID, Jovita Gamma, MD, 5,000 Units at 09/28/17 0901 .  cyclobenzaprine (FLEXERIL) tablet 5-10 mg, 5-10 mg, Oral, TID PRN, Jovita Gamma, MD, 5 mg at 09/27/17 1930 .  dextrose 5 % and 0.45 % NaCl with KCl 20 mEq/L infusion, , Intravenous, Continuous, Jovita Gamma, MD, Last Rate: 100 mL/hr at 09/27/17 1251, 100 mL/hr at 09/27/17 1251 .  HYDROcodone-acetaminophen (NORCO/VICODIN) 5-325 MG per tablet 1-2 tablet, 1-2 tablet, Oral, Q4H PRN, Jovita Gamma, MD .  hydrOXYzine (ATARAX/VISTARIL) tablet 50 mg, 50 mg, Oral, Q4H PRN, Jovita Gamma, MD .  hydrOXYzine (VISTARIL) injection 50 mg, 50 mg, Intramuscular, Q4H PRN, Jovita Gamma, MD .  ketorolac (TORADOL) 30 MG/ML injection 15 mg, 15 mg, Intravenous, Q6H,  Jovita Gamma, MD, 15 mg at 09/28/17 0500 .  magnesium hydroxide (MILK OF MAGNESIA) suspension 30 mL, 30 mL, Oral, Daily PRN, Jovita Gamma, MD .   menthol-cetylpyridinium (CEPACOL) lozenge 3 mg, 1 lozenge, Oral, PRN **OR** phenol (CHLORASEPTIC) mouth spray 1 spray, 1 spray, Mouth/Throat, PRN, Jovita Gamma, MD .  morphine 4 MG/ML injection 4-8 mg, 4-8 mg, Intramuscular, Q3H PRN, Jovita Gamma, MD .  ondansetron (ZOFRAN) injection 4-8 mg, 4-8 mg, Intravenous, Q6H PRN, Jovita Gamma, MD .  ondansetron (ZOFRAN) tablet 4-8 mg, 4-8 mg, Oral, Q6H PRN, Jovita Gamma, MD .  oxyCODONE (Oxy IR/ROXICODONE) immediate release tablet 5-10 mg, 5-10 mg, Oral, Q4H PRN, Jovita Gamma, MD .  pramipexole (MIRAPEX) tablet 1 mg, 1 mg, Oral, TID, Jovita Gamma, MD, 1 mg at 09/28/17 0900 .  sodium chloride flush (NS) 0.9 % injection 3 mL, 3 mL, Intravenous, Q12H, Jovita Gamma, MD, 3 mL at 09/28/17 0902 .  sodium chloride flush (NS) 0.9 % injection 3 mL, 3 mL, Intravenous, PRN, Jovita Gamma, MD .  sodium phosphate (FLEET) 7-19 GM/118ML enema 1 enema, 1 enema, Rectal, Once PRN, Jovita Gamma, MD Labs CBC    Component Value Date/Time   WBC 7.0 09/21/2017 1038   RBC 4.06 09/21/2017 1038   HGB 11.9 (L) 09/21/2017 1038   HCT 37.1 09/21/2017 1038   PLT 252 09/21/2017 1038   MCV 91.4 09/21/2017 1038   MCH 29.3 09/21/2017 1038   MCHC 32.1 09/21/2017 1038   RDW 13.2 09/21/2017 1038   LYMPHSABS 1,728 11/17/2016 1154   MONOABS 576 11/17/2016 1154   EOSABS 128 11/17/2016 1154   BASOSABS 0 11/17/2016 1154    CMP     Component Value Date/Time   NA 139 09/21/2017 1038   K 3.9 09/21/2017 1038   CL 108 09/21/2017 1038   CO2 24 09/21/2017 1038   GLUCOSE 98 09/21/2017 1038   BUN 25 (H) 09/21/2017 1038   CREATININE 0.87 09/21/2017 1038   CREATININE 0.79 11/17/2016 1154   CALCIUM 8.9 09/21/2017 1038   PROT 5.9 (L) 11/17/2016 1154   ALBUMIN 3.8 11/17/2016 1154   AST 11 11/17/2016 1154   ALT 6 11/17/2016 1154   ALKPHOS 94 11/17/2016 1154   BILITOT 0.4 11/17/2016 1154   GFRNONAA >60 09/21/2017 1038   GFRNONAA 74 11/17/2016 1154    GFRAA >60 09/21/2017 1038   GFRAA 85 11/17/2016 1154     Assessment: 76 year old woman with history of Parkinson's, with new onset dyskinesias postoperatively. Possibly related to propofol.  Propofol associated dyskinesias in Parkinson's patients usually stop with cessation of the drug, so this is mildly atypical.  Another contributor infected could have been not receiving her Sinemet perioperatively. I tried to reach Dr. Carles Collet over at [facility] neurology, but unfortunately she was out of the office and I was able to reach her colleague Dr. Delice Lesch and discussed her case.  Impression Postoperative dyskinesias in a Parkinson's patient  Recommendations: Continue home regimen of Parkinson's medications Ativan 1 mg as needed She will improve over time.  This was consensus in discussion with Dr. Delice Lesch. Please call us with questions.   Amie Portland, MD [facility] [phone] If 7pm to 7am, please call on call as listed on AMION.

## 2017-09-28 NOTE — Progress Notes (Signed)
Vitals:   09/27/17 1648 09/27/17 2000 09/28/17 0009 09/28/17 0400  BP: 103/76 116/84 (!) 108/96 (!) 103/50  Pulse: 95 (!) 102 (!) 103 86  Resp: 16 18 16 18   Temp: 98 F (36.7 C) 98.1 F (36.7 C) 99 F (37.2 C) 98.2 F (36.8 C)  TempSrc:  Oral Oral Oral  SpO2: 94% 94% 94% 94%    Patient's dyskinetic movement significantly lessened. Hopefully she continues on her usual Sinemet regimen, and will continue to improve. Excellent relief of preoperative disabling radicular pain. Dressing clean and dry. Has ambulated once in the hallways.  Plan: Spoke with patient and staff about her ambulate in the halls at least 5 times throughout today and this evening. Will DC Foley, nursing staff to monitor voiding function. Appreciate Dr. Cecil Cobbs neurology consultation and assistance.  Hosie Spangle, MD 09/28/2017, 8:00 AM

## 2017-09-29 MED ORDER — HYDROCODONE-ACETAMINOPHEN 5-325 MG PO TABS: 1.0000 | ORAL_TABLET | ORAL | 0 refills | Status: DC | PRN

## 2017-09-29 MED ORDER — CYCLOBENZAPRINE HCL 5 MG PO TABS: 5.0000 mg | ORAL_TABLET | Freq: Three times a day (TID) | ORAL | 0 refills | Status: DC | PRN

## 2017-09-29 NOTE — Progress Notes (Signed)
Patient being discharged home with husband. Discharge instructions reviewed with patient and husband, understood.  Prescription given for pain meds.

## 2017-09-29 NOTE — Progress Notes (Signed)
OT Evaluation   09/29/17 0800  OT Visit Information  Last OT Received On 09/29/17  Assistance Needed +1  History of Present Illness Patient is a 76 y/o female who presents s/p L2-3 PLIF and decompression. Pt with postoperative dyskinesias from propofol. PMH includes prior back surgeries, PD, prediabetes, HLD.   Precautions  Precautions Back  Precaution Booklet Issued Yes (comment)  Precaution Comments pt unable to recall back precautions reviewed by PT  Required Braces or Orthoses Spinal Brace  Spinal Brace Lumbar corset;Applied in sitting position  Restrictions  Weight Bearing Restrictions No  Home Living  Family/patient expects to be discharged to: Private residence  Living Arrangements Spouse/significant other  Available Help at Discharge Family;Available 24 hours/day  Type of Home House  Home Access Stairs to enter  Entrance Stairs-Number of Steps 1  Entrance Stairs-Rails None  Home Layout One level  Bathroom Shower/Tub Walk-in Cytogeneticist Yes  Home Equipment [place] - 2 wheels;Walker - 4 wheels;Walker - standard;Cane - single point;Toilet riser;Shower seat  Prior Function  Level of Independence Needs assistance  Gait / Transfers Assistance Needed Pt assists with ambulation using rollator; reports mulitple falls.  ADL's / Homemaking Assistance Needed Pt states she does alot of her self care  Pain Assessment  Pain Assessment Faces  Faces Pain Scale 2  Pain Location back  Pain Descriptors / Indicators Sore;Operative site guarding;Grimacing  Pain Intervention(s) Limited activity within patient's tolerance  Cognition  Arousal/Alertness Awake/alert  Behavior During Therapy Impulsive  Overall Cognitive Status Impaired/Different from baseline  Area of Impairment Attention;Safety/judgement;Awareness  Orientation Level Disoriented to;Time  Current Attention Level Selective  Memory Decreased short-term memory;Decreased recall of  precautions  Following Commands Follows one step commands consistently  Safety/Judgement Decreased awareness of safety;Decreased awareness of deficits  Awareness Emergent  Upper Extremity Assessment  Upper Extremity Assessment (incoordinated movements and using BUE functionally)  Lower Extremity Assessment  Lower Extremity Assessment Generalized weakness;Defer to PT evaluation  Cervical / Trunk Assessment  Cervical / Trunk Assessment Other exceptions  Cervical / Trunk Exceptions dyskinesias in cervical spine; back surgery  ADL  Overall ADL's  Needs assistance/impaired  Functional mobility during ADLs Rolling walker;Minimal assistance  General ADL Comments Pt unable to recall back precautions just taught by PT. Reviewed back precautions with husband regarding ADL. Pt staes she has AE to assist with ADL. Educated on compensatory techniques for pericare. Also educated on home safety and reducing risk of falls. Pt/husband verbalized understanding.   Bed Mobility  General bed mobility comments received in chair  Transfers  Overall transfer level Needs assistance  Equipment used Rolling walker (2 wheeled)  Transfers Sit to/from Stand  Sit to Stand Min assist  General transfer comment min assist for elevation to upright, cues for safety and hand positioning with use of RW  Balance  Overall balance assessment Needs assistance  Sitting-balance support Feet supported;Bilateral upper extremity supported  Sitting balance-Leahy Scale Fair  Sitting balance - Comments Requires Min guard assist for sitting balance,. total A to donn LSO.  Standing balance support During functional activity;Bilateral upper extremity supported  Standing balance-Leahy Scale Poor  Standing balance comment Reliant on UEs for support in standing and external support due to dykinesias.  OT - End of Session  Equipment Utilized During Treatment Back brace;Rolling walker  Activity Tolerance Patient tolerated treatment well   Patient left in chair;with call bell/phone within reach;with family/visitor present  Nurse Communication Mobility status  OT Assessment  OT Recommendation/Assessment All further  OT needs can be met in the next venue of care  OT Visit Diagnosis Other abnormalities of gait and mobility (R26.89);Muscle weakness (generalized) (M62.81);History of falling (Z91.81);Other symptoms and signs involving cognitive function;Pain  Pain - part of body (back)  OT Problem List Decreased activity tolerance;Impaired balance (sitting and/or standing);Decreased safety awareness;Decreased knowledge of use of DME or AE;Decreased knowledge of precautions;Pain  AM-PAC OT "6 Clicks" Daily Activity Outcome Measure  Help from another person eating meals? 3  Help from another person taking care of personal grooming? 3  Help from another person toileting, which includes using toliet, bedpan, or urinal? 3  Help from another person bathing (including washing, rinsing, drying)? 3  Help from another person to put on and taking off regular upper body clothing? 3  Help from another person to put on and taking off regular lower body clothing? 3  6 Click Score 18  ADL G Code Conversion CK  OT Recommendation  Follow Up Recommendations Home health OT;Supervision/Assistance - 24 hour  OT Equipment None recommended by OT  Individuals Consulted  Consulted and Agree with Results and Recommendations Patient;Family member/caregiver  Family Member Consulted husband  Acute Rehab OT Goals  Patient Stated Goal spouse would like pt to come home  OT Goal Formulation All assessment and education complete, DC therapy  OT Time Calculation  OT Start Time (ACUTE ONLY) 0845  OT Stop Time (ACUTE ONLY) 0859  OT Time Calculation (min) 14 min  OT General Charges  $OT Visit 1 Visit  OT Evaluation  $OT Eval Low Complexity 1 Low  Written Expression  Dominant Hand Right  [facility], OT/L  [phone] 09/29/2017

## 2017-09-29 NOTE — Discharge Summary (Signed)
Physician Discharge Summary  Patient ID: Alexandra Lindsey MRN: [id number] DOB/AGE: 1941-08-14 76 y.o.  Admit date: 09/27/2017 Discharge date: 09/29/2017  Admission Diagnoses:  Lumbar stenosis with neurogenic claudication  Discharge Diagnoses:  Same Active Problems:   Lumbar stenosis with neurogenic claudication  Discharged Condition: Stable  Hospital Course:  Alexandra Lindsey is a 76 y.o. female who was admitted for the below procedure. Post operatively had dyskinesias which Neurology was consulted for. Thought to be due to propofol in combination with Parkinson's. Neurology thought would improve with time. Resolved day prior to discharge. At time of discharge, pain was well controlled, ambulating with Pt/OT, tolerating po, voiding normal. Ready for discharge. PT rec HH. Believe this is necesssary. I also believe she needs [facility] nursing and aide as well as OT due to gait instability and other complex medical conditions for support at home until she is facilitated back to normal routine. Orders entered.  Treatments: Surgery Procedure(s):  Bilateral L2 and L3 decompressive lumbar laminectomy with bilateral L2-3 facetectomy and foraminotomies for decompression of the stenotic compression of the exiting L2 and L3 nerve roots bilaterally, with decompression beyond that required for interbody arthrodesis; bilateral L2-3 posterior lumbar interbody arthrodesis with AVS peek interbody implants, Vitoss BA with bone marrow aspirate, and infuse; right L1-L3 and left L1-L4 posterior lateral arthrodesis with segmental radius posterior instrumentation, Vitoss BA with bone marrow aspirate, and infuse  Discharge Exam: Blood pressure 95/63, pulse 83, temperature 97.8 F (36.6 C), temperature source Oral, resp. rate 12, height 5' (1.524 m), weight 62 kg (136 lb 11 oz), SpO2 95 %. Awake, alert, oriented Speech fluent, appropriate CN grossly intact maew with good strength Wound c/d/i  Disposition: Home with  home health  Discharge Instructions    Call MD for:  difficulty breathing, headache or visual disturbances    Complete by:  As directed    Call MD for:  persistant dizziness or light-headedness    Complete by:  As directed    Call MD for:  redness, tenderness, or signs of infection (pain, swelling, redness, odor or green/yellow discharge around incision site)    Complete by:  As directed    Call MD for:  severe uncontrolled pain    Complete by:  As directed    Call MD for:  temperature >100.4    Complete by:  As directed    Diet general    Complete by:  As directed    Driving Restrictions    Complete by:  As directed    Do not drive until given clearance.   Increase activity slowly    Complete by:  As directed    Lifting restrictions    Complete by:  As directed    Do not lift anything >10lbs. Avoid bending and twisting in awkward positions. Avoid bending at the back.   May shower / Bathe    Complete by:  As directed    In 24 hours. Okay to wash wound with warm soapy water. Avoid scrubbing the wound. Pat dry.   Remove dressing in 24 hours    Complete by:  As directed      Allergies as of 09/29/2017      Reactions   Macrobid [nitrofurantoin Macrocrystal] Swelling, Rash   Baclofen Other (See Comments)   Kept her awake, affected memory   Diclofenac    The gel caused a rash   Cephalosporins Other (See Comments)   Cannot sleep   Codeine Nausea Only   dizziness  Darvocet [propoxyphene N-acetaminophen] Nausea And Vomiting   Levaquin [levofloxacin] Hives   Penicillins Rash   Selegiline Hcl Rash   Tramadol Nausea And Vomiting   Triamcinolone Itching      Medication List    STOP taking these medications   diclofenac sodium 1 % Gel Commonly known as:  VOLTAREN     TAKE these medications   acetaminophen 500 MG tablet Commonly known as:  TYLENOL Take 1,000 mg by mouth 2 (two) times daily as needed for moderate pain or headache.   BLUE GEL EX Apply 1 application  topically 3 (three) times daily as needed (muscle pain).   carbidopa-levodopa 25-100 MG tablet Commonly known as:  SINEMET IR 3 tablets at 5:00 am, 2 tablets at 9:00 am, 1 tablet at 1:00 pm, 2 tablets at 5:00 pm and one at night, extra PRN What changed:  additional instructions   cyclobenzaprine 5 MG tablet Commonly known as:  FLEXERIL Take 1-2 tablets (5-10 mg total) by mouth 3 (three) times daily as needed for muscle spasms.   diphenhydrAMINE 25 MG tablet Commonly known as:  BENADRYL Take 25 mg by mouth daily as needed for allergies.   HYDROcodone-acetaminophen 5-325 MG tablet Commonly known as:  NORCO/VICODIN Take 1-2 tablets by mouth every 4 (four) hours as needed for moderate pain.   hydrocortisone cream 1 % Apply 1 application topically daily as needed for itching.   neomycin-bacitracin-polymyxin ointment Commonly known as:  NEOSPORIN Apply 1 application topically as needed for wound care.   pramipexole 1 MG tablet Commonly known as:  MIRAPEX TAKE 1 TABLET BY MOUTH 3 TIMES DAILY   ranitidine 150 MG tablet Commonly known as:  ZANTAC Take 150 mg by mouth daily as needed for heartburn.   sodium chloride 0.65 % Soln nasal spray Commonly known as:  OCEAN Place 1 spray into both nostrils as needed for congestion.   VISINE OP Apply 1 drop to eye daily as needed (dry eyes).   vitamin B-12 1000 MCG tablet Commonly known as:  CYANOCOBALAMIN Take 1,000 mcg by mouth 2 (two) times a week.   Vitamin D3 5000 units Caps Take 5,000 Units by mouth 2 (two) times daily.      Follow-up Information    Shirlean Kelly, MD Follow up.   Specialty:  Neurosurgery Contact information: [address] [place] [address] [phone]           Signed: Alyson Ingles 09/29/2017, 7:22 AM

## 2017-09-29 NOTE — Progress Notes (Signed)
Physical Therapy Treatment Patient Details Name: Alexandra Lindsey MRN: [id number] DOB: 1941/05/25 Today's Date: 09/29/2017    History of Present Illness Patient is a 76 y/o female who presents s/p L2-3 PLIF and decompression. Pt with postoperative dyskinesias from propofol. PMH includes prior back surgeries, PD, prediabetes, HLD.     PT Comments    Patient with noted improvements in mobility but continues to require hands on assist for safety due to dyskinesias. Ambulating with RW and receptive to education but requires cues for compliance. Continue to recommend maximal home health services upon acute discharge.   Follow Up Recommendations  Home health PT;Supervision for mobility/OOB;Supervision/Assistance - 24 hour     Equipment Recommendations  None recommended by PT    Recommendations for Other Services       Precautions / Restrictions Precautions Precautions: Back Precaution Booklet Issued: Yes (comment) Precaution Comments: However pt unable to comprehend at this time. Required Braces or Orthoses: Spinal Brace Spinal Brace: Lumbar corset;Applied in sitting position Restrictions Weight Bearing Restrictions: No    Mobility  Bed Mobility               General bed mobility comments: received in chair  Transfers Overall transfer level: Needs assistance Equipment used: Rolling walker (2 wheeled) Transfers: Sit to/from Stand Sit to Stand: Min assist         General transfer comment: min assist for elevation to upright, cues for safety and hand positioning with use of RW  Ambulation/Gait Ambulation/Gait assistance: Min assist (Moderate assist for LOB when turning) Ambulation Distance (Feet): 180 Feet Assistive device: Rolling walker (2 wheeled) Gait Pattern/deviations: Step-through pattern;Decreased step length - right;Decreased step length - left;Decreased stride length;Scissoring;Ataxic;Staggering right;Staggering left;Narrow base of support;Drifts  right/left;Trunk flexed Gait velocity: decreased   General Gait Details: Cues for safety and positioning with RW. Impulsive gait, staggering like gait with scissoring and narrow BoS. Takes UE off walker at times requiring more assist; Dyskinesias noted throughout trunk and LEs. (increased assist during turns (LOB noted moderate assist))   Stairs            Wheelchair Mobility    Modified Rankin (Stroke Patients Only)       Balance Overall balance assessment: Needs assistance Sitting-balance support: Feet supported;Bilateral upper extremity supported Sitting balance-Leahy Scale: Fair Sitting balance - Comments: Requires Min guard assist for sitting balance,. total A to donn LSO.   Standing balance support: During functional activity;Bilateral upper extremity supported Standing balance-Leahy Scale: Poor Standing balance comment: Reliant on UEs for support in standing and external support due to dykinesias.                            Cognition Arousal/Alertness: Awake/alert Behavior During Therapy: Impulsive Overall Cognitive Status: Impaired/Different from baseline Area of Impairment: Attention;Safety/judgement;Awareness                   Current Attention Level: Sustained     Safety/Judgement: Decreased awareness of safety Awareness: Emergent          Exercises      General Comments        Pertinent Vitals/Pain      Home Living Family/patient expects to be discharged to:: Private residence Living Arrangements: Spouse/significant other Available Help at Discharge: Family;Available 24 hours/day Type of Home: House Home Access: Stairs to enter Entrance Stairs-Rails: None Home Layout: One level Home Equipment: Environmental consultant - 2 wheels;Walker - 4 wheels;Walker - standard;Cane - single point;Toilet riser  Prior Function Level of Independence: Needs assistance  Gait / Transfers Assistance Needed: Pt assists with ambulation using rollator;  reports mulitple falls. ADL's / Homemaking Assistance Needed: Husband assists witha ll care.      PT Goals (current goals can now be found in the care plan section) Acute Rehab PT Goals Patient Stated Goal: spouse would like pt to come home PT Goal Formulation: With family Time For Goal Achievement: 10/12/17 Potential to Achieve Goals: Fair    Frequency    Min 5X/week      PT Plan      Co-evaluation              AM-PAC PT "6 Clicks" Daily Activity  Outcome Measure  Difficulty turning over in bed (including adjusting bedclothes, sheets and blankets)?: Unable Difficulty moving from lying on back to sitting on the side of the bed? : Unable Difficulty sitting down on and standing up from a chair with arms (e.g., wheelchair, bedside commode, etc,.)?: A Little Help needed moving to and from a bed to chair (including a wheelchair)?: A Little Help needed walking in hospital room?: A Little Help needed climbing 3-5 steps with a railing? : A Lot 6 Click Score: 13    End of Session Equipment Utilized During Treatment: Back brace;Gait belt Activity Tolerance: Patient tolerated treatment well Patient left: in chair;with call bell/phone within reach Nurse Communication: Mobility status PT Visit Diagnosis: Unsteadiness on feet (R26.81);Muscle weakness (generalized) (M62.81);Pain;Difficulty in walking, not elsewhere classified (R26.2);History of falling (Z91.81);Other symptoms and signs involving the nervous system (R29.898) Pain - part of body:  (back)     Time: 6812-7517 PT Time Calculation (min) (ACUTE ONLY): 17 min  Charges:  $Gait Training: 8-22 mins                    G Codes:       Alben Deeds, PT DPT  Board Certified Neurologic Specialist [place] 09/29/2017, 8:56 AM

## 2017-09-29 NOTE — Progress Notes (Signed)
Patient doing well Reports dyskinesia returned to baseline level Eager to go home Will d/c

## 2017-10-03 ENCOUNTER — Telehealth: Payer: Self-pay | Admitting: *Deleted

## 2017-10-03 NOTE — Telephone Encounter (Signed)
Spoke with Charlestine Massed who has chosen [facility] to provide [facility] services for his wife. CM explained that referral would be made and a rep from this agency would be in contact with him within 24 - 48 hours of referral to arrange initial visit. Instructed Charlestine Massed to call me back if he did not here something within 24 hours@ [phone]. No further CM needs at this time. Faxed all F2F orders and facesheet to [facility] [place] at [phone].

## 2017-10-04 ENCOUNTER — Other Ambulatory Visit: Payer: Self-pay | Admitting: Neurology

## 2017-10-07 ENCOUNTER — Emergency Department (HOSPITAL_COMMUNITY): Payer: Medicare Other

## 2017-10-07 ENCOUNTER — Other Ambulatory Visit: Payer: Self-pay

## 2017-10-07 ENCOUNTER — Encounter (HOSPITAL_COMMUNITY): Payer: Self-pay | Admitting: *Deleted

## 2017-10-07 ENCOUNTER — Emergency Department (HOSPITAL_COMMUNITY)
Admission: EM | Admit: 2017-10-07 | Discharge: 2017-10-07 | Disposition: A | Discharge status: Home / Self Care | Payer: Medicare Other | Attending: Emergency Medicine | Admitting: Emergency Medicine

## 2017-10-07 DIAGNOSIS — G20 Parkinson's disease: Secondary | ICD-10-CM | POA: Diagnosis not present

## 2017-10-07 DIAGNOSIS — M545 Low back pain, unspecified: Secondary | ICD-10-CM

## 2017-10-07 DIAGNOSIS — M79605 Pain in left leg: Secondary | ICD-10-CM | POA: Insufficient documentation

## 2017-10-07 DIAGNOSIS — I10 Essential (primary) hypertension: Secondary | ICD-10-CM | POA: Insufficient documentation

## 2017-10-07 DIAGNOSIS — Z79899 Other long term (current) drug therapy: Secondary | ICD-10-CM | POA: Diagnosis not present

## 2017-10-07 DIAGNOSIS — M5489 Other dorsalgia: Secondary | ICD-10-CM | POA: Diagnosis not present

## 2017-10-07 DIAGNOSIS — R52 Pain, unspecified: Secondary | ICD-10-CM | POA: Diagnosis not present

## 2017-10-07 DIAGNOSIS — M5416 Radiculopathy, lumbar region: Secondary | ICD-10-CM | POA: Diagnosis not present

## 2017-10-07 LAB — URINALYSIS, ROUTINE W REFLEX MICROSCOPIC
Bilirubin Urine: NEGATIVE
Glucose, UA: NEGATIVE mg/dL
HGB URINE DIPSTICK: NEGATIVE
Ketones, ur: NEGATIVE mg/dL
Leukocytes, UA: NEGATIVE
Nitrite: NEGATIVE
PH: 7 (ref 5.0–8.0)
Protein, ur: NEGATIVE mg/dL
SPECIFIC GRAVITY, URINE: 1.002 — AB (ref 1.005–1.030)

## 2017-10-07 LAB — CBC WITH DIFFERENTIAL/PLATELET
Basophils Absolute: 0 10*3/uL (ref 0.0–0.1)
Basophils Relative: 0 %
EOS ABS: 0.2 10*3/uL (ref 0.0–0.7)
Eosinophils Relative: 3 %
HEMATOCRIT: 30 % — AB (ref 36.0–46.0)
HEMOGLOBIN: 9.5 g/dL — AB (ref 12.0–15.0)
LYMPHS ABS: 1.6 10*3/uL (ref 0.7–4.0)
LYMPHS PCT: 24 %
MCH: 29.1 pg (ref 26.0–34.0)
MCHC: 31.7 g/dL (ref 30.0–36.0)
MCV: 91.7 fL (ref 78.0–100.0)
MONOS PCT: 7 %
Monocytes Absolute: 0.5 10*3/uL (ref 0.1–1.0)
NEUTROS ABS: 4.5 10*3/uL (ref 1.7–7.7)
NEUTROS PCT: 66 %
Platelets: 528 10*3/uL — ABNORMAL HIGH (ref 150–400)
RBC: 3.27 MIL/uL — AB (ref 3.87–5.11)
RDW: 13.5 % (ref 11.5–15.5)
WBC: 6.8 10*3/uL (ref 4.0–10.5)

## 2017-10-07 LAB — BASIC METABOLIC PANEL
ANION GAP: 9 (ref 5–15)
BUN: 18 mg/dL (ref 6–20)
CALCIUM: 8.8 mg/dL — AB (ref 8.9–10.3)
CO2: 22 mmol/L (ref 22–32)
Chloride: 107 mmol/L (ref 101–111)
Creatinine, Ser: 0.94 mg/dL (ref 0.44–1.00)
GFR, EST NON AFRICAN AMERICAN: 58 mL/min — AB (ref >60)
Glucose, Bld: 100 mg/dL — ABNORMAL HIGH (ref 65–99)
Potassium: 3.9 mmol/L (ref 3.5–5.1)
SODIUM: 138 mmol/L (ref 135–145)

## 2017-10-07 MED ORDER — HYDROMORPHONE HCL 2 MG PO TABS: 1.0000 mg | ORAL_TABLET | ORAL | 0 refills | Status: DC | PRN

## 2017-10-07 MED ORDER — DOCUSATE SODIUM 100 MG PO CAPS: 100.0000 mg | ORAL_CAPSULE | Freq: Two times a day (BID) | ORAL | 0 refills | Status: DC

## 2017-10-07 MED ORDER — FENTANYL CITRATE (PF) 100 MCG/2ML IJ SOLN
50.0000 ug | Freq: Once | INTRAMUSCULAR | Status: AC
  Administered 2017-10-07: 50 ug via INTRAVENOUS
  Filled 2017-10-07: qty 2

## 2017-10-07 MED ORDER — HYDROMORPHONE HCL 1 MG/ML IJ SOLN
0.5000 mg | Freq: Once | INTRAMUSCULAR | Status: AC
  Administered 2017-10-07: 0.5 mg via INTRAVENOUS
  Filled 2017-10-07: qty 1

## 2017-10-07 MED ORDER — LORAZEPAM 2 MG/ML IJ SOLN: 0.2500 mg | Freq: Once | INTRAMUSCULAR | Status: DC | PRN

## 2017-10-07 MED ORDER — IBUPROFEN 600 MG PO TABS: 600.0000 mg | ORAL_TABLET | Freq: Four times a day (QID) | ORAL | 0 refills | Status: DC | PRN

## 2017-10-07 MED ORDER — ONDANSETRON HCL 4 MG/2ML IJ SOLN
4.0000 mg | Freq: Once | INTRAMUSCULAR | Status: AC
  Administered 2017-10-07: 4 mg via INTRAVENOUS
  Filled 2017-10-07: qty 2

## 2017-10-07 MED ORDER — ONDANSETRON HCL 4 MG PO TABS: 4.0000 mg | ORAL_TABLET | Freq: Three times a day (TID) | ORAL | 0 refills | Status: DC | PRN

## 2017-10-07 NOTE — ED Triage Notes (Signed)
Pt here via [facility] for new back pain after lumbar surgery by Dr Rita Ohara on 09/27/17.  Pt states pain is now L sided, was R previous to surgery.  Able to ambulate to stretcher.  Denies numbness, tingling or loss of bowel or bladder control.  Finished pain meds Fri.

## 2017-10-07 NOTE — ED Notes (Signed)
Patient transported to MRI 

## 2017-10-07 NOTE — Discharge Instructions (Signed)
Take 1/2-1 tablet of the Dilaudid every 4 hours as needed  for pain control You may take 600 mg of motrin or 500 mg of tylenol in between your doses of dilaudid for pain. Also take the zofran for nausea control and you may take the flexeril as you need for spasms. See Dr. Rita Ohara this week. SEEK IMMEDIATE MEDICAL ATTENTION IF: New numbness, tingling, weakness, or problem with the use of your arms or legs.  Severe back pain not relieved with medications.  Change in bowel or bladder control.  Increasing pain in any areas of the body (such as chest or abdominal pain).  Shortness of breath, dizziness or fainting.  Nausea (feeling sick to your stomach), vomiting, fever, or sweats.

## 2017-10-07 NOTE — ED Provider Notes (Signed)
[facility] Provider Note   CSN: [id number] Arrival date & time: 10/07/17  9528     History   Chief Complaint Chief Complaint  Patient presents with  . Back Pain    post back surgery    HPI Alexandra Lindsey is a very pleasant 76 y.o. female with a pmh of Parkinsons who is S/P Lumbar surgery (L2/L3 decompression, Posterior interbody fusions, and posterior lateral arthrodesis) by Dr. Sherwood Gambler on 09/27/2017. The patient presents for severe/ constant left sided pain.  She states that this is different from the pain she had prior to surgery.  She states that it was on her right side.  That pain has gone away however since she was discharged on the third she has had constant, severe pain radiating down the left leg which she describes as squeezing and knifelike.  It is constant however at times much worse especially with weightbearing or twisting or other movements that catch her off guard.  She has taken hydrocodone and Flexeril with no relief in her pain and no change in her pain.  She denies new weakness, saddle anesthesia, loss of control of her bowel or bladder, fevers, shaking chills, nausea or vomiting.  She denies dysuria.  She is able to ambulate with pain.  The patient also has had no PT, OT or home health as per her discharge.  They have contacted the hospital several times and was supposed to have their first visit today however came to the ER because of uncontrolled pain.  HPI  Past Medical History:  Diagnosis Date  . Anemia    as young girl and teenager  . Arthritis   . Chest pain, unspecified   . Constipation   . Costochondritis   . GERD (gastroesophageal reflux disease)   . Headache    ocular migraines  . History of hiatal hernia   . Hyperlipidemia 10-07-12  . Never smoked tobacco   . Parkinson's disease ([place]) 10-07-12  . Parkinson's disease ([place])   . Prediabetes   . 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 Spinal stenosis      Patient Active Problem List   Diagnosis Date Noted  . Positive colorectal cancer screening using Cologuard test 03/26/2017  . 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 Lumbar radiculopathy 09/13/2015  . Greater trochanteric bursitis of right hip 08/04/2015  . Greater trochanteric bursitis of left hip 02/17/2015  . Piriformis syndrome of left side 11/05/2014  . SI (sacroiliac) joint dysfunction 11/05/2014  . Vitamin D deficiency 09/06/2014  . Medication management 09/06/2014  . Hypertension   . GERD (gastroesophageal reflux disease)   . Prediabetes   . 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 Parkinson's disease ([place])   . 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 Hyperlipidemia 10/07/2012    Past Surgical History:  Procedure Laterality Date  . ABDOMINAL HYSTERECTOMY    . APPENDECTOMY    . BACK SURGERY  12/20/2015  . BUNIONECTOMY Bilateral 11-11-3  . CARDIAC CATHETERIZATION Bilateral 2013  . COLONOSCOPY    . HERNIA REPAIR    . ROTATOR CUFF REPAIR Left 10-07-12    OB History    No data available       Home Medications    Prior to Admission medications   Medication Sig Start Date End Date Taking? Authorizing Provider  acetaminophen (TYLENOL) 500 MG tablet Take 1,000 mg by mouth 2 (two) times daily as needed for moderate pain or headache.  Yes [provider]  carbidopa-levodopa (SINEMET IR) 25-100 MG tablet TAKE 3 TABLETS  5AM, TAKE 2 TABLETS AT9AM, TAKE 1 TABLET AT 1PM, TAKE 2 TABLETS AT 5PM, AND TAKE 1 TABLET AT NIGHT, AND TAKE EXTRA TAB PRN 10/04/17  Yes [name], Eustace Quail, DO  Cholecalciferol (VITAMIN D3) 5000 units CAPS Take 5,000 Units by mouth 2 (two) times daily.   Yes [provider]  cyclobenzaprine (FLEXERIL) 5 MG tablet Take 1-2 tablets (5-10 mg total) by mouth 3 (three) times daily as needed for muscle spasms. 09/29/17  Yes Costella, Vista Mink, PA-C  HYDROcodone-acetaminophen  (NORCO/VICODIN) 5-325 MG tablet Take 1-2 tablets by mouth every 4 (four) hours as needed for moderate pain. 09/29/17  Yes Costella, Vista Mink, PA-C  hydrocortisone cream 1 % Apply 1 application topically daily as needed for itching.   Yes [provider]  Menthol, Topical Analgesic, (BLUE GEL EX) Apply 1 application topically 3 (three) times daily as needed (muscle pain).   Yes [provider]  neomycin-bacitracin-polymyxin (NEOSPORIN) ointment Apply 1 application topically as needed for wound care.   Yes [provider]  pramipexole (MIRAPEX) 1 MG tablet TAKE 1 TABLET BY MOUTH 3 TIMES DAILY 09/25/17  Yes [name], Eustace Quail, DO  ranitidine (ZANTAC) 150 MG tablet Take 150 mg by mouth daily as needed for heartburn.    Yes [provider]  vitamin B-12 (CYANOCOBALAMIN) 1000 MCG tablet Take 1,000 mcg by mouth 2 (two) times a week.    Yes [provider]  docusate sodium (COLACE) 100 MG capsule Take 1 capsule (100 mg total) every 12 (twelve) hours by mouth. 10/07/17   Lexxie Winberg, PA-C  HYDROmorphone (DILAUDID) 2 MG tablet Take 0.5-1 tablets (1-2 mg total) every 4 (four) hours as needed by mouth for severe pain. 10/07/17   Margarita Mail, PA-C  ibuprofen (ADVIL,MOTRIN) 600 MG tablet Take 1 tablet (600 mg total) every 6 (six) hours as needed by mouth. 10/07/17   Tijah Hane, PA-C  ondansetron (ZOFRAN) 4 MG tablet Take 1 tablet (4 mg total) every 8 (eight) hours as needed by mouth for nausea or vomiting. 10/07/17   Margarita Mail, PA-C    Family History Family History  Problem Relation Age of Onset  . Heart attack Mother   . Esophageal cancer Mother   . CVA Brother     Social History Social History   Tobacco Use  . Smoking status: Never Smoker  . Smokeless tobacco: Never Used  Substance Use Topics  . Alcohol use: No    Alcohol/week: 0.0 oz  . Drug use: No     Allergies   Macrobid [nitrofurantoin macrocrystal]; Baclofen; Cephalosporins;  Codeine; Darvocet [propoxyphene n-acetaminophen]; Diclofenac; Levaquin [levofloxacin]; Penicillins; Selegiline hcl; Tramadol; and Triamcinolone   Review of Systems Review of Systems Ten systems reviewed and are negative for acute change, except as noted in the HPI.    Physical Exam Updated Vital Signs BP 123/89 (BP Location: Right Arm)   Pulse 88   Temp 97.9 F (36.6 C) (Oral)   Resp 16   Ht 5' (1.524 m)   Wt 61.7 kg (136 lb)   SpO2 96%   BMI 26.56 kg/m   Physical Exam  Physical Exam  Nursing note and vitals reviewed. Constitutional: She is oriented to person, place, and time. She appears well-developed and well-nourished. No distress.  HENT:  Head: Normocephalic and atraumatic.  Eyes: Conjunctivae normal and EOM are normal. Pupils are equal, round, and reactive to light. No scleral icterus.  Neck: Normal range of motion.  Cardiovascular: Normal rate, regular rhythm and normal heart sounds.  Exam reveals no gallop and no friction rub.   No murmur heard. Pulmonary/Chest: Effort normal and breath sounds normal. No respiratory distress.  Abdominal: Soft. Bowel sounds are normal. She exhibits no distension and no mass. There is no tenderness. There is no guarding.  Musculoskeletal: Well healing midline lumbar surgical scar with minimal bruising, minimal warmth, no erythema, minimal tenderness.  Minimal tenderness in the bilateral paraspinal muscle tissue or gluteal tissue. Neurological: She is alert and oriented to person, place, and time. Mild intermittent cogwheel rigidity.  Decreased 1+ patellar tendons bilaterally. Normal strength with plantar and dorsiflexion of the bilateral ankles. Skin: Skin is warm and dry. She is not diaphoretic.    ED Treatments / Results  Labs (all labs ordered are listed, but only abnormal results are displayed) Labs Reviewed  BASIC METABOLIC PANEL - Abnormal; Notable for the following components:      Result Value   Glucose, Bld 100 (*)     Calcium 8.8 (*)    GFR calc non Af Amer 58 (*)    All other components within normal limits  CBC WITH DIFFERENTIAL/PLATELET - Abnormal; Notable for the following components:   RBC 3.27 (*)    Hemoglobin 9.5 (*)    HCT 30.0 (*)    Platelets 528 (*)    All other components within normal limits  URINALYSIS, ROUTINE W REFLEX MICROSCOPIC - Abnormal; Notable for the following components:   Color, Urine COLORLESS (*)    Specific Gravity, Urine 1.002 (*)    All other components within normal limits    EKG  EKG Interpretation None       Radiology Mr Lumbar Spine Wo Contrast  Result Date: 10/07/2017 CLINICAL DATA:  Lumbar radiculopathy.  Prior surgery 09/27/2017 EXAM: MRI LUMBAR SPINE WITHOUT CONTRAST TECHNIQUE: Multiplanar, multisequence MR imaging of the lumbar spine was performed. No intravenous contrast was administered. COMPARISON:  09/27/2017, MRI 08/07/2017 FINDINGS: Segmentation:  Standard Alignment:  Normal Vertebrae: Negative for fracture or mass. Pedicle screw and interbody fusion has been extended to L2-3 and L3-4 since the prior study. This is causing significant artifact obscuring the spinal canal and vertebral bodies. In addition, the patient was not able to hold still for the study. Conus medullaris: Extends to the L2 level and appears normal. Paraspinal and other soft tissues: Fluid collection posteriorly extending from L2 through L5. This measures 15 x 30 mm on axial scans is likely postoperative fluid related to recent surgery. Disc levels: Spinal canal not well evaluated due to artifact from hardware and motion. L1-2: Interval pedicle screw fusion bilaterally. Disc spaces normal without stenosis L2-3:  Interval pedicle screw and interbody fusion. L3-4: Pedicle screw and interbody fusion unchanged. No stenosis identified L4-5: Pedicle screw and interbody fusion without stenosis L5-S1:  Mild facet degeneration without stenosis. IMPRESSION: Interval extension of surgery to include  the L1-2 and L2-3 levels. Currently the fusion extends from L1 through L5. Hardware and motion obscure the spinal canal. No spinal stenosis is identified 15 x 30 mm fluid collection posteriorly in the soft tissues extending from L2 through L5. This is most likely postop fluid given the recent surgery. Correlate with any symptoms of infection. Electronically Signed   By: Franchot Gallo M.D.   On: 10/07/2017 13:06    Procedures Procedures (including critical care time)  Medications Ordered in ED Medications  LORazepam (ATIVAN) injection 0.25 mg (not administered)  fentaNYL (SUBLIMAZE) injection  50 mcg (50 mcg Intravenous Given 10/07/17 1032)  ondansetron (ZOFRAN) injection 4 mg (4 mg Intravenous Given 10/07/17 1032)  HYDROmorphone (DILAUDID) injection 0.5 mg (0.5 mg Intravenous Given 10/07/17 1128)  HYDROmorphone (DILAUDID) injection 0.5 mg (0.5 mg Intravenous Given 10/07/17 1607)     Initial Impression / Assessment and Plan / ED Course  I have reviewed the triage vital signs and the nursing notes.  Pertinent labs & imaging results that were available during my care of the patient were reviewed by me and considered in my medical decision making (see chart for details).  Clinical Course as of Oct 07 1758  Nancy Fetter Oct 07, 2017  1150 I spoke with Dr. Vertell Limber who advises non-con MRI Lumbar spine top r/o hematoma. Clot or other surgical complication. Patient updated on plan.  [AH]    Clinical Course User Index [AH] Margarita Mail, PA-C   Patient's MRI has returned.  I spoke with Dr. Trenton Gammon about the findings.  He is reviewed the images and does not feel that there is any sign of infection.  Patient will need improved pain control.  She is unable to tolerate Percocet and hydrocodone was not working.  I have ordered low-dose Dilaudid which she may take one half or one whole tablet along with scheduled Motrin and Tylenol.  Patient will also receive Zofran to control nausea symptoms and Colace for bowel  regimen.  She is to follow closely with Dr. Sherwood Gambler this week.  No red flag symptoms.  She is ambulatory and appears appropriate for discharge at this time.  Final Clinical Impressions(s) / ED Diagnoses   Final diagnoses:  Acute left-sided low back pain without sciatica    ED Discharge Orders        Ordered    HYDROmorphone (DILAUDID) 2 MG tablet  Every 4 hours PRN     10/07/17 1541    ondansetron (ZOFRAN) 4 MG tablet  Every 8 hours PRN     10/07/17 1541    ibuprofen (ADVIL,MOTRIN) 600 MG tablet  Every 6 hours PRN     10/07/17 1541    docusate sodium (COLACE) 100 MG capsule  Every 12 hours     10/07/17 1542       Margarita Mail, PA-C 10/07/17 1759    Gareth Morgan, MD 10/14/17 1733

## 2017-10-07 NOTE — ED Notes (Signed)
Pt ambulated to bathroom with standby assistance x 3 while in ED.  Denies burning or pain with urination.  Family states urinary frequency is normal for pt.

## 2017-10-08 DIAGNOSIS — M5416 Radiculopathy, lumbar region: Secondary | ICD-10-CM | POA: Diagnosis not present

## 2017-10-08 DIAGNOSIS — Z981 Arthrodesis status: Secondary | ICD-10-CM | POA: Diagnosis not present

## 2017-10-11 DIAGNOSIS — G20 Parkinson's disease: Secondary | ICD-10-CM | POA: Diagnosis not present

## 2017-10-11 DIAGNOSIS — R7303 Prediabetes: Secondary | ICD-10-CM | POA: Diagnosis not present

## 2017-10-11 DIAGNOSIS — M48062 Spinal stenosis, lumbar region with neurogenic claudication: Secondary | ICD-10-CM | POA: Diagnosis not present

## 2017-10-11 DIAGNOSIS — I10 Essential (primary) hypertension: Secondary | ICD-10-CM | POA: Diagnosis not present

## 2017-10-11 DIAGNOSIS — Z4789 Encounter for other orthopedic aftercare: Secondary | ICD-10-CM | POA: Diagnosis not present

## 2017-10-11 DIAGNOSIS — R296 Repeated falls: Secondary | ICD-10-CM | POA: Diagnosis not present

## 2017-10-11 DIAGNOSIS — K219 Gastro-esophageal reflux disease without esophagitis: Secondary | ICD-10-CM | POA: Diagnosis not present

## 2017-10-11 DIAGNOSIS — E785 Hyperlipidemia, unspecified: Secondary | ICD-10-CM | POA: Diagnosis not present

## 2017-10-12 ENCOUNTER — Telehealth: Payer: Self-pay | Admitting: Neurology

## 2017-10-12 DIAGNOSIS — R7303 Prediabetes: Secondary | ICD-10-CM | POA: Diagnosis not present

## 2017-10-12 DIAGNOSIS — Z4789 Encounter for other orthopedic aftercare: Secondary | ICD-10-CM | POA: Diagnosis not present

## 2017-10-12 DIAGNOSIS — M48062 Spinal stenosis, lumbar region with neurogenic claudication: Secondary | ICD-10-CM | POA: Diagnosis not present

## 2017-10-12 DIAGNOSIS — R296 Repeated falls: Secondary | ICD-10-CM | POA: Diagnosis not present

## 2017-10-12 DIAGNOSIS — G20 Parkinson's disease: Secondary | ICD-10-CM | POA: Diagnosis not present

## 2017-10-12 DIAGNOSIS — I10 Essential (primary) hypertension: Secondary | ICD-10-CM | POA: Diagnosis not present

## 2017-10-12 NOTE — Telephone Encounter (Signed)
Agree.  She had a rough time previously with surgery.  I also wonder if "violent jerking" means dyskinesia because Alexandra Lindsey often has a lot of dyskinesia that doesn't bother her but if one isn't used to seeing it, it can be quite remarkable.  Dyskinesia can increase with pain too and I would like to see how she does once she heals a bit.

## 2017-10-12 NOTE — Telephone Encounter (Signed)
Spoke with Ronalee Belts with [facility] and he is concerned about patient's pain post surgery and her PD symptoms getting worse. He wanted to know if PD meds could be adjusted. I did explain to him that you wouldn't adjust meds post surgery because it's typical for PD symptoms to get worse at that time, but should get better.  He states she is having a lot of violent jerking through her entire body. She was given muscle relaxer but not really taking it. I advised that she should be taking that to help and defer pain management to Dr. Rita Ohara.  He expressed appreciation and just wanted [place] aware of how she was doing.

## 2017-10-12 NOTE — Telephone Encounter (Signed)
Ronalee Belts with [facility] called and would like a call back regarding Pt and some medication issues CB# [phone]

## 2017-10-13 DIAGNOSIS — G20 Parkinson's disease: Secondary | ICD-10-CM | POA: Diagnosis not present

## 2017-10-13 DIAGNOSIS — I10 Essential (primary) hypertension: Secondary | ICD-10-CM | POA: Diagnosis not present

## 2017-10-13 DIAGNOSIS — Z4789 Encounter for other orthopedic aftercare: Secondary | ICD-10-CM | POA: Diagnosis not present

## 2017-10-13 DIAGNOSIS — M48062 Spinal stenosis, lumbar region with neurogenic claudication: Secondary | ICD-10-CM | POA: Diagnosis not present

## 2017-10-13 DIAGNOSIS — R7303 Prediabetes: Secondary | ICD-10-CM | POA: Diagnosis not present

## 2017-10-13 DIAGNOSIS — R296 Repeated falls: Secondary | ICD-10-CM | POA: Diagnosis not present

## 2017-10-16 DIAGNOSIS — M48062 Spinal stenosis, lumbar region with neurogenic claudication: Secondary | ICD-10-CM | POA: Diagnosis not present

## 2017-10-16 DIAGNOSIS — G20 Parkinson's disease: Secondary | ICD-10-CM | POA: Diagnosis not present

## 2017-10-16 DIAGNOSIS — Z4789 Encounter for other orthopedic aftercare: Secondary | ICD-10-CM | POA: Diagnosis not present

## 2017-10-16 DIAGNOSIS — R296 Repeated falls: Secondary | ICD-10-CM | POA: Diagnosis not present

## 2017-10-16 DIAGNOSIS — R7303 Prediabetes: Secondary | ICD-10-CM | POA: Diagnosis not present

## 2017-10-16 DIAGNOSIS — I10 Essential (primary) hypertension: Secondary | ICD-10-CM | POA: Diagnosis not present

## 2017-10-22 DIAGNOSIS — I10 Essential (primary) hypertension: Secondary | ICD-10-CM | POA: Diagnosis not present

## 2017-10-22 DIAGNOSIS — G20 Parkinson's disease: Secondary | ICD-10-CM | POA: Diagnosis not present

## 2017-10-22 DIAGNOSIS — R7303 Prediabetes: Secondary | ICD-10-CM | POA: Diagnosis not present

## 2017-10-22 DIAGNOSIS — Z4789 Encounter for other orthopedic aftercare: Secondary | ICD-10-CM | POA: Diagnosis not present

## 2017-10-22 DIAGNOSIS — M48062 Spinal stenosis, lumbar region with neurogenic claudication: Secondary | ICD-10-CM | POA: Diagnosis not present

## 2017-10-22 DIAGNOSIS — R296 Repeated falls: Secondary | ICD-10-CM | POA: Diagnosis not present

## 2017-10-23 ENCOUNTER — Ambulatory Visit (INDEPENDENT_AMBULATORY_CARE_PROVIDER_SITE_OTHER): Payer: Medicare Other | Admitting: Internal Medicine

## 2017-10-23 ENCOUNTER — Other Ambulatory Visit: Payer: Self-pay | Admitting: Neurosurgery

## 2017-10-23 VITALS — BP 146/76 | HR 68 | Temp 97.7°F | Resp 68 | Ht 60.0 in | Wt 134.6 lb

## 2017-10-23 DIAGNOSIS — Z79899 Other long term (current) drug therapy: Secondary | ICD-10-CM | POA: Diagnosis not present

## 2017-10-23 DIAGNOSIS — M25552 Pain in left hip: Secondary | ICD-10-CM | POA: Diagnosis not present

## 2017-10-23 DIAGNOSIS — D509 Iron deficiency anemia, unspecified: Secondary | ICD-10-CM

## 2017-10-23 DIAGNOSIS — M5416 Radiculopathy, lumbar region: Secondary | ICD-10-CM

## 2017-10-23 DIAGNOSIS — N3000 Acute cystitis without hematuria: Secondary | ICD-10-CM | POA: Diagnosis not present

## 2017-10-23 DIAGNOSIS — G8928 Other chronic postprocedural pain: Secondary | ICD-10-CM

## 2017-10-23 DIAGNOSIS — M7062 Trochanteric bursitis, left hip: Secondary | ICD-10-CM | POA: Diagnosis not present

## 2017-10-23 DIAGNOSIS — M25551 Pain in right hip: Secondary | ICD-10-CM | POA: Insufficient documentation

## 2017-10-23 DIAGNOSIS — R300 Dysuria: Secondary | ICD-10-CM

## 2017-10-23 MED ORDER — PREDNISONE 20 MG PO TABS: ORAL_TABLET | ORAL | 0 refills | Status: DC

## 2017-10-23 MED ORDER — GABAPENTIN 600 MG PO TABS: ORAL_TABLET | ORAL | 3 refills | Status: DC

## 2017-10-23 NOTE — Patient Instructions (Signed)

## 2017-10-23 NOTE — Progress Notes (Signed)
Subjective:    Patient ID: Alexandra Lindsey, female    DOB: 10-28-1941, 76 y.o.   MRN: [id number]  HPI   This nice 76 yo MWF w/ Parkinsons Dz  is brought in by her by husband for c/o LBP. Patient is s/p Lumbar surg 09/27/2017 by Dr Rita Ohara. She reports ongoing and increasing LBP since surgery.  Currently she's taking # 12 tab Norco (5/325) /day. She's also c/o pain in the Rt hip area. She also c/o dysuria.   Medication Sig  . acetaminophen (TYLENOL) 500 MG tablet Take 1,000 mg by mouth 2 (two) times daily as needed for moderate pain or headache.  . carbidopa-levodopa (SINEMET IR) 25-100 MG tablet TAKE 3 TABLETS  5AM, TAKE 2 TABLETS AT9AM, TAKE 1 TABLET AT 1PM, TAKE 2 TABLETS AT 5PM, AND TAKE 1 TABLET AT NIGHT, AND TAKE EXTRA TAB PRN  . Cholecalciferol (VITAMIN D3) 5000 units CAPS Take 5,000 Units by mouth 2 (two) times daily.  . cyclobenzaprine (FLEXERIL) 5 MG tablet Take 1-2 tablets (5-10 mg total) by mouth 3 (three) times daily as needed for muscle spasms.  [name] docusate sodium (COLACE) 100 MG capsule Take 1 capsule (100 mg total) every 12 (twelve) hours by mouth.  [name] HYDROcodone-acetaminophen (NORCO/VICODIN) 5-325 MG tablet Take 1-2 tablets by mouth every 4 (four) hours as needed for moderate pain.  . hydrocortisone cream 1 % Apply 1 application topically daily as needed for itching.  [name] HYDROmorphone (DILAUDID) 2 MG tablet Take 0.5-1 tablets (1-2 mg total) every 4 (four) hours as needed by mouth for severe pain.  [name] ibuprofen (ADVIL,MOTRIN) 600 MG tablet Take 1 tablet (600 mg total) every 6 (six) hours as needed by mouth.  . ondansetron (ZOFRAN) 4 MG tablet Take 1 tablet (4 mg total) every 8 (eight) hours as needed by mouth for nausea or vomiting.  . pramipexole (MIRAPEX) 1 MG tablet TAKE 1 TABLET BY MOUTH 3 TIMES DAILY  . ranitidine (ZANTAC) 150 MG tablet Take 150 mg by mouth daily as needed for heartburn.   . vitamin B-12 (CYANOCOBALAMIN) 1000 MCG tablet Take 1,000 mcg by mouth 2 (two) times a  week.   . Menthol, Topical Analgesic, (BLUE GEL EX) Apply 1 application topically 3 (three) times daily as needed (muscle pain).  [name] neomycin-bacitracin-polymyxin (NEOSPORIN) ointment Apply 1 application topically as needed for wound care.   No facility-administered medications prior to visit.    Allergies  Allergen Reactions  . Macrobid [Nitrofurantoin Macrocrystal] Swelling and Rash  . Baclofen Other (See Comments)    Kept her awake, affected memory  . Cephalosporins Other (See Comments)    Cannot sleep  . Codeine Nausea Only    dizziness  . Darvocet [Propoxyphene N-Acetaminophen] Nausea And Vomiting  . Diclofenac Rash    The gel caused a rash  . Levaquin [Levofloxacin] Hives  . Penicillins Rash  . Selegiline Hcl Rash  . Tramadol Nausea And Vomiting  . Triamcinolone Itching   Past Medical History:  Diagnosis Date  . Anemia    as young girl and teenager  . Arthritis   . Chest pain, unspecified   . Constipation   . Costochondritis   . GERD (gastroesophageal reflux disease)   . Headache    ocular migraines  . History of hiatal hernia   . Hyperlipidemia 10-07-12  . Never smoked tobacco   . Parkinson's disease ([place]) 10-07-12  . Parkinson's disease ([place])   . Prediabetes   . 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 Spinal stenosis  Review of Systems  10 point systems review negative except as above.    Objective:   Physical Exam  BP (!) 146/76   Pulse 68   Temp 97.7 F (36.5 C)   Resp (!) 68   Ht 5' (1.524 m)   Wt 134 lb 9.6 oz (61.1 kg)   BMI 26.29 kg/m   HEENT - WNL. Neck - supple.  Chest - Clear equal BS. Cor - Nl HS. RRR w/o sig MGR. PP 1(+). No edema. MS- FROM w/o deformities. Exquisite tenderness over the Lt Hip greater trochanteric bursa and pain exacerbated with L hip int /ext rotation and abduction.  Gait asupported by husband. Neuro -  Nl w/o focal abnormalities.    Assessment & Plan:   1. Dysuria  - Urinalysis, Routine w reflex  microscopic - Urine Culture  2. Acute cystitis   - Urinalysis, Routine w reflex microscopic - Urine Culture - CBC with Differential/Platelet  3. Other chronic postprocedural pain  - Recc increase her Gabapentin - discussed titration w/Husband  - gabapentin (NEURONTIN) 600 MG tablet; Take 1/2 to 1 tablet 3 to 4 x / day for Chronic & Acute Pain  Dispense: 120 tablet; Refill: 3  4. Left hip pain  - Consider Ortho evaluation pending Neuro surg W/U  - predniSONE (DELTASONE) 20 MG tablet; 1 tab 3 x day for 3 days, then 1 tab 2 x day for 3 days, then 1 tab 1 x day for 5 days  Dispense: 20 tablet; Refill: 0  - gabapentin (NEURONTIN) 600 MG tablet; Take 1/2 to 1 tablet 3 to 4 x / day for Chronic & Acute Pain  Dispense: 120 tablet; Refill: 3  5. Greater trochanteric bursitis of left hip  - predniSONE (DELTASONE) 20 MG tablet; 1 tab 3 x day for 3 days, then 1 tab 2 x day for 3 days, then 1 tab 1 x day for 5 days  Dispense: 20 tablet; Refill: 0  - gabapentin (NEURONTIN) 600 MG tablet; Take 1/2 to 1 tablet 3 to 4 x / day for Chronic & Acute Pain  Dispense: 120 tablet; Refill: 3  6. Iron deficiency anemia, unspecified iron deficiency anemia type  - Ferritin - Iron,Total/Total Iron Binding Cap - Reticulocytes  7. Medication management  - Ferritin - Iron,Total/Total Iron Binding Cap - Reticulocytes - BASIC METABOLIC PANEL WITH GFR - Hepatic function panel - CBC with Differential/Platelet

## 2017-10-24 DIAGNOSIS — Z4789 Encounter for other orthopedic aftercare: Secondary | ICD-10-CM | POA: Diagnosis not present

## 2017-10-24 DIAGNOSIS — R296 Repeated falls: Secondary | ICD-10-CM | POA: Diagnosis not present

## 2017-10-24 DIAGNOSIS — G20 Parkinson's disease: Secondary | ICD-10-CM | POA: Diagnosis not present

## 2017-10-24 DIAGNOSIS — I10 Essential (primary) hypertension: Secondary | ICD-10-CM | POA: Diagnosis not present

## 2017-10-24 DIAGNOSIS — M48062 Spinal stenosis, lumbar region with neurogenic claudication: Secondary | ICD-10-CM | POA: Diagnosis not present

## 2017-10-24 DIAGNOSIS — R7303 Prediabetes: Secondary | ICD-10-CM | POA: Diagnosis not present

## 2017-10-24 LAB — CBC WITH DIFFERENTIAL/PLATELET
Basophils Absolute: 51 cells/uL (ref 0–200)
Basophils Relative: 1.1 %
EOS PCT: 5 %
Eosinophils Absolute: 230 cells/uL (ref 15–500)
HCT: 31.1 % — ABNORMAL LOW (ref 35.0–45.0)
Hemoglobin: 9.6 g/dL — ABNORMAL LOW (ref 11.7–15.5)
Lymphs Abs: 1504 cells/uL (ref 850–3900)
MCH: 29.2 pg (ref 27.0–33.0)
MCHC: 30.9 g/dL — ABNORMAL LOW (ref 32.0–36.0)
MCV: 94.5 fL (ref 80.0–100.0)
MPV: 11 fL (ref 7.5–12.5)
Monocytes Relative: 10.1 %
NEUTROS PCT: 51.1 %
Neutro Abs: 2351 cells/uL (ref 1500–7800)
PLATELETS: 315 10*3/uL (ref 140–400)
RBC: 3.29 10*6/uL — AB (ref 3.80–5.10)
RDW: 13 % (ref 11.0–15.0)
TOTAL LYMPHOCYTE: 32.7 %
WBC mixed population: 465 cells/uL (ref 200–950)
WBC: 4.6 10*3/uL (ref 3.8–10.8)

## 2017-10-24 LAB — URINALYSIS, ROUTINE W REFLEX MICROSCOPIC
Bilirubin Urine: NEGATIVE
Glucose, UA: NEGATIVE
Hgb urine dipstick: NEGATIVE
LEUKOCYTES UA: NEGATIVE
NITRITE: NEGATIVE
Protein, ur: NEGATIVE
Specific Gravity, Urine: 1.027 (ref 1.001–1.03)
pH: 5.5 (ref 5.0–8.0)

## 2017-10-24 LAB — HEPATIC FUNCTION PANEL
AG RATIO: 2.2 (calc) (ref 1.0–2.5)
ALBUMIN MSPROF: 4 g/dL (ref 3.6–5.1)
ALT: 11 U/L (ref 6–29)
AST: 14 U/L (ref 10–35)
Alkaline phosphatase (APISO): 135 U/L — ABNORMAL HIGH (ref 33–130)
BILIRUBIN DIRECT: 0 mg/dL (ref 0.0–0.2)
BILIRUBIN TOTAL: 0.3 mg/dL (ref 0.2–1.2)
Globulin: 1.8 g/dL (calc) — ABNORMAL LOW (ref 1.9–3.7)
Indirect Bilirubin: 0.3 mg/dL (calc) (ref 0.2–1.2)
Total Protein: 5.8 g/dL — ABNORMAL LOW (ref 6.1–8.1)

## 2017-10-24 LAB — TEST AUTHORIZATION

## 2017-10-24 LAB — BASIC METABOLIC PANEL WITH GFR
BUN/Creatinine Ratio: 21 (calc) (ref 6–22)
BUN: 21 mg/dL (ref 7–25)
CALCIUM: 8.9 mg/dL (ref 8.6–10.4)
CO2: 20 mmol/L (ref 20–32)
CREATININE: 1.02 mg/dL — AB (ref 0.60–0.93)
Chloride: 109 mmol/L (ref 98–110)
GFR, EST AFRICAN AMERICAN: 62 mL/min/{1.73_m2} (ref >=60)
GFR, EST NON AFRICAN AMERICAN: 54 mL/min/{1.73_m2} — AB (ref >=60)
Glucose, Bld: 98 mg/dL (ref 65–99)
POTASSIUM: 5.1 mmol/L (ref 3.5–5.3)
Sodium: 142 mmol/L (ref 135–146)

## 2017-10-24 LAB — URINE CULTURE
MICRO NUMBER:: 81330884
Result:: NO GROWTH
SPECIMEN QUALITY: ADEQUATE

## 2017-10-24 LAB — RETICULOCYTES
ABS RETIC: 52480 {cells}/uL (ref 20000–8000)
RETIC CT PCT: 1.6 %

## 2017-10-24 LAB — IRON, TOTAL/TOTAL IRON BINDING CAP
%SAT: 13 % (calc) (ref 11–50)
IRON: 39 ug/dL — AB (ref 45–160)
TIBC: 296 mcg/dL (calc) (ref 250–450)

## 2017-10-24 LAB — FERRITIN: FERRITIN: 121 ng/mL (ref 20–288)

## 2017-10-26 ENCOUNTER — Ambulatory Visit
Admission: RE | Admit: 2017-10-26 | Discharge: 2017-10-26 | Disposition: A | Discharge status: Home / Self Care | Payer: Medicare Other | Source: Ambulatory Visit | Attending: Neurosurgery | Admitting: Neurosurgery

## 2017-10-26 DIAGNOSIS — M545 Low back pain: Secondary | ICD-10-CM | POA: Diagnosis not present

## 2017-10-26 DIAGNOSIS — M5416 Radiculopathy, lumbar region: Secondary | ICD-10-CM

## 2017-10-28 ENCOUNTER — Encounter: Payer: Self-pay | Admitting: Internal Medicine

## 2017-10-29 DIAGNOSIS — M48062 Spinal stenosis, lumbar region with neurogenic claudication: Secondary | ICD-10-CM | POA: Diagnosis not present

## 2017-10-29 DIAGNOSIS — R296 Repeated falls: Secondary | ICD-10-CM | POA: Diagnosis not present

## 2017-10-29 DIAGNOSIS — Z4789 Encounter for other orthopedic aftercare: Secondary | ICD-10-CM | POA: Diagnosis not present

## 2017-10-29 DIAGNOSIS — R7303 Prediabetes: Secondary | ICD-10-CM | POA: Diagnosis not present

## 2017-10-29 DIAGNOSIS — G20 Parkinson's disease: Secondary | ICD-10-CM | POA: Diagnosis not present

## 2017-10-29 DIAGNOSIS — I10 Essential (primary) hypertension: Secondary | ICD-10-CM | POA: Diagnosis not present

## 2017-10-31 DIAGNOSIS — I10 Essential (primary) hypertension: Secondary | ICD-10-CM | POA: Diagnosis not present

## 2017-10-31 DIAGNOSIS — Z4789 Encounter for other orthopedic aftercare: Secondary | ICD-10-CM | POA: Diagnosis not present

## 2017-10-31 DIAGNOSIS — R296 Repeated falls: Secondary | ICD-10-CM | POA: Diagnosis not present

## 2017-10-31 DIAGNOSIS — M48062 Spinal stenosis, lumbar region with neurogenic claudication: Secondary | ICD-10-CM | POA: Diagnosis not present

## 2017-10-31 DIAGNOSIS — G20 Parkinson's disease: Secondary | ICD-10-CM | POA: Diagnosis not present

## 2017-10-31 DIAGNOSIS — R7303 Prediabetes: Secondary | ICD-10-CM | POA: Diagnosis not present

## 2017-11-01 ENCOUNTER — Ambulatory Visit (HOSPITAL_COMMUNITY)
Admission: RE | Admit: 2017-11-01 | Discharge: 2017-11-01 | Disposition: A | Discharge status: Home / Self Care | Payer: Medicare Other | Source: Ambulatory Visit | Attending: Internal Medicine | Admitting: Internal Medicine

## 2017-11-01 ENCOUNTER — Other Ambulatory Visit: Payer: Self-pay | Admitting: Internal Medicine

## 2017-11-01 ENCOUNTER — Ambulatory Visit (INDEPENDENT_AMBULATORY_CARE_PROVIDER_SITE_OTHER): Payer: Medicare Other | Admitting: Internal Medicine

## 2017-11-01 ENCOUNTER — Encounter: Payer: Self-pay | Admitting: Internal Medicine

## 2017-11-01 VITALS — BP 116/60 | HR 80 | Temp 97.5°F | Resp 16 | Ht 60.0 in | Wt 130.2 lb

## 2017-11-01 DIAGNOSIS — M25552 Pain in left hip: Secondary | ICD-10-CM | POA: Insufficient documentation

## 2017-11-01 DIAGNOSIS — M25551 Pain in right hip: Secondary | ICD-10-CM | POA: Diagnosis not present

## 2017-11-01 NOTE — Progress Notes (Signed)
Subjective:    Patient ID: Alexandra Lindsey, female    DOB: 01-Oct-1941, 76 y.o.   MRN: [id number]  HPI   This very nice 76 yo MWF with HTN & Parkinson's Disease had Lumbar surg w/hardware on Nov 1st by Dr Rita Ohara and has been having increasing LBP with recent Lumbar CT suspect for loosening of screws.  In addition to her low back pains she also localizes pain to the deep Lt pelvic hip jt area with pain exacerbating with L hip int/ext rotation, flex/ext and abduction. Also localizes pain over the Lt Gr Troch area. Empiric Tx with a Prednisone pulse/taper over the last 2 weeks had no significant benefit. Also doubled Gabapentin up to 600 mg 3 x/ day w/o significant control of pain.  Medication Sig  . TYLENOL 500 MG Take 1,000 mg  2  times daily as needed for moderate pain or headache.  [name] SINEMET IR 25-100 MG t 3 TAB  5AM,  2 TAB AT 9AM, 1 TAB AT 1PM,  2 TAB AT 5PM, AND  1 TAB AT NIGHT, AND TAKE EXTRA TAB PRN  . VIT D 5000 units CAPS Take  2  times daily.  . cyclobenzaprine 5 MG  Take 1-2 tab 3  times daily as needed for muscle spasms.  [name] COLACE 100 MG  Take 1 cap every 12  hrs   . gabapentin  600 MG Take 1/2 to 1 tablet 3 to 4 x / day for Chronic & Acute Pain  . VICODIN 5-325 MG  Take 1-2 tab every 4  hrs as needed for moderate pain.  . hydrocortisone crm 1 % Apply 1 application topically daily as needed for itching.  [name] DILAUDID 2 MG tablet Take 0.5-1 tab every 4 hrs as needed for severe pain.  [name] ibuprofen  600 MG tab Take 1 tab every 6  hrs as needed   . ondansetron 4 MG tab Take 1 tab every 8  hrs as needed  for nausea or vomiting.  [name] MIRAPEX 1 MG  TAKE 1 TAB 3 TIMES DAILY  . predniSONE  20 MG tablet Completed  . ranitidine 150 MG tablet Take  daily as needed for heartburn.   . vitamin B-12 1000 MCG tab Take 1,000 mcg  2 (two) times a week.    Allergies  Allergen Reactions  . Macrobid [Nitrofurantoin Macrocrystal] Swelling and Rash  . Baclofen Other (See Comments)    Kept her awake,  affected memory  . Cephalosporins Other (See Comments)    Cannot sleep  . Codeine Nausea Only    dizziness  . Darvocet [Propoxyphene N-Acetaminophen] Nausea And Vomiting  . Diclofenac Rash    The gel caused a rash  . Levaquin [Levofloxacin] Hives  . Penicillins Rash  . Selegiline Hcl Rash  . Tramadol Nausea And Vomiting  . Triamcinolone Itching   Past Medical History:  Diagnosis Date  . Anemia    as young girl and teenager  . Arthritis   . Chest pain, unspecified   . Constipation   . Costochondritis   . GERD (gastroesophageal reflux disease)   . Headache    ocular migraines  . History of hiatal hernia   . Hyperlipidemia 10-07-12  . Never smoked tobacco   . Parkinson's disease ([place]) 10-07-12  . Parkinson's disease ([place])   . Prediabetes   . 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 Spinal stenosis    Review of Systems    10 point systems review negative except as  above.    Objective:   Physical Exam  BP 116/60   Pulse 80   Temp (!) 97.5 F (36.4 C)   Resp 16   Ht 5' (1.524 m)   Wt 130 lb 3.2 oz (59.1 kg)   BMI 25.43 kg/m   HEENT - WNL. Neck - supple.  Chest - Clear equal BS. Cor - Nl HS. RRR w/o sig MGR. PP 1(+). No edema. MS- Painful Lt hip ROM and tender over the Lt Gr Troch bursa. Gait painful and supported by a walker or her husband. Neuro -  Nl w/o focal abnormalities.    Assessment & Plan:   1. Left hip pain  - DG HIP UNILAT WITH PELVIS 2-3 VIEWS LEFT  - if Hip X-Rays negative or inconclusive, will refer for Orthopedic consultation

## 2017-11-02 ENCOUNTER — Encounter: Payer: Self-pay | Admitting: Internal Medicine

## 2017-11-02 DIAGNOSIS — I10 Essential (primary) hypertension: Secondary | ICD-10-CM | POA: Diagnosis not present

## 2017-11-02 DIAGNOSIS — M48062 Spinal stenosis, lumbar region with neurogenic claudication: Secondary | ICD-10-CM | POA: Diagnosis not present

## 2017-11-02 DIAGNOSIS — R296 Repeated falls: Secondary | ICD-10-CM | POA: Diagnosis not present

## 2017-11-02 DIAGNOSIS — R7303 Prediabetes: Secondary | ICD-10-CM | POA: Diagnosis not present

## 2017-11-02 DIAGNOSIS — G20 Parkinson's disease: Secondary | ICD-10-CM | POA: Diagnosis not present

## 2017-11-02 DIAGNOSIS — Z4789 Encounter for other orthopedic aftercare: Secondary | ICD-10-CM | POA: Diagnosis not present

## 2017-11-02 DIAGNOSIS — M7072 Other bursitis of hip, left hip: Secondary | ICD-10-CM | POA: Diagnosis not present

## 2017-11-06 DIAGNOSIS — I10 Essential (primary) hypertension: Secondary | ICD-10-CM | POA: Diagnosis not present

## 2017-11-06 DIAGNOSIS — M48062 Spinal stenosis, lumbar region with neurogenic claudication: Secondary | ICD-10-CM | POA: Diagnosis not present

## 2017-11-06 DIAGNOSIS — Z4789 Encounter for other orthopedic aftercare: Secondary | ICD-10-CM | POA: Diagnosis not present

## 2017-11-06 DIAGNOSIS — R7303 Prediabetes: Secondary | ICD-10-CM | POA: Diagnosis not present

## 2017-11-06 DIAGNOSIS — G20 Parkinson's disease: Secondary | ICD-10-CM | POA: Diagnosis not present

## 2017-11-06 DIAGNOSIS — R296 Repeated falls: Secondary | ICD-10-CM | POA: Diagnosis not present

## 2017-11-08 DIAGNOSIS — R7303 Prediabetes: Secondary | ICD-10-CM | POA: Diagnosis not present

## 2017-11-08 DIAGNOSIS — R296 Repeated falls: Secondary | ICD-10-CM | POA: Diagnosis not present

## 2017-11-08 DIAGNOSIS — I10 Essential (primary) hypertension: Secondary | ICD-10-CM | POA: Diagnosis not present

## 2017-11-08 DIAGNOSIS — G20 Parkinson's disease: Secondary | ICD-10-CM | POA: Diagnosis not present

## 2017-11-08 DIAGNOSIS — Z4789 Encounter for other orthopedic aftercare: Secondary | ICD-10-CM | POA: Diagnosis not present

## 2017-11-08 DIAGNOSIS — M48062 Spinal stenosis, lumbar region with neurogenic claudication: Secondary | ICD-10-CM | POA: Diagnosis not present

## 2017-11-09 DIAGNOSIS — I10 Essential (primary) hypertension: Secondary | ICD-10-CM | POA: Diagnosis not present

## 2017-11-09 DIAGNOSIS — R296 Repeated falls: Secondary | ICD-10-CM | POA: Diagnosis not present

## 2017-11-09 DIAGNOSIS — Z4789 Encounter for other orthopedic aftercare: Secondary | ICD-10-CM | POA: Diagnosis not present

## 2017-11-09 DIAGNOSIS — G20 Parkinson's disease: Secondary | ICD-10-CM | POA: Diagnosis not present

## 2017-11-09 DIAGNOSIS — M48062 Spinal stenosis, lumbar region with neurogenic claudication: Secondary | ICD-10-CM | POA: Diagnosis not present

## 2017-11-09 DIAGNOSIS — R7303 Prediabetes: Secondary | ICD-10-CM | POA: Diagnosis not present

## 2017-11-12 DIAGNOSIS — G20 Parkinson's disease: Secondary | ICD-10-CM | POA: Diagnosis not present

## 2017-11-12 DIAGNOSIS — R7303 Prediabetes: Secondary | ICD-10-CM | POA: Diagnosis not present

## 2017-11-12 DIAGNOSIS — M48062 Spinal stenosis, lumbar region with neurogenic claudication: Secondary | ICD-10-CM | POA: Diagnosis not present

## 2017-11-12 DIAGNOSIS — I10 Essential (primary) hypertension: Secondary | ICD-10-CM | POA: Diagnosis not present

## 2017-11-12 DIAGNOSIS — Z4789 Encounter for other orthopedic aftercare: Secondary | ICD-10-CM | POA: Diagnosis not present

## 2017-11-12 DIAGNOSIS — R296 Repeated falls: Secondary | ICD-10-CM | POA: Diagnosis not present

## 2017-11-14 DIAGNOSIS — R296 Repeated falls: Secondary | ICD-10-CM | POA: Diagnosis not present

## 2017-11-14 DIAGNOSIS — G20 Parkinson's disease: Secondary | ICD-10-CM | POA: Diagnosis not present

## 2017-11-14 DIAGNOSIS — R7303 Prediabetes: Secondary | ICD-10-CM | POA: Diagnosis not present

## 2017-11-14 DIAGNOSIS — Z4789 Encounter for other orthopedic aftercare: Secondary | ICD-10-CM | POA: Diagnosis not present

## 2017-11-14 DIAGNOSIS — M48062 Spinal stenosis, lumbar region with neurogenic claudication: Secondary | ICD-10-CM | POA: Diagnosis not present

## 2017-11-14 DIAGNOSIS — I10 Essential (primary) hypertension: Secondary | ICD-10-CM | POA: Diagnosis not present

## 2017-11-16 DIAGNOSIS — Z01419 Encounter for gynecological examination (general) (routine) without abnormal findings: Secondary | ICD-10-CM | POA: Diagnosis not present

## 2017-11-16 DIAGNOSIS — M545 Low back pain: Secondary | ICD-10-CM | POA: Diagnosis not present

## 2017-11-16 DIAGNOSIS — Z1231 Encounter for screening mammogram for malignant neoplasm of breast: Secondary | ICD-10-CM | POA: Diagnosis not present

## 2017-11-26 ENCOUNTER — Telehealth: Payer: Self-pay | Admitting: *Deleted

## 2017-11-26 NOTE — Telephone Encounter (Addendum)
Pt states she needs an appt her neuroma is acting up. I spoke with pt and told her to ice the area 3-4 times a day for 15 minutes/session and protect the skin from the cold with fabric, if tolerates can take over the counter ibuprofen as package directs and wear a wide, stiff bottomed shoe to allow for spread and to decrease the bend and rubbing in the area. Pt states understanding and pt asked if the appt would be in [place] and I told her when the schedulers called just let them know what center would be convenient for her.

## 2017-11-28 ENCOUNTER — Encounter: Payer: Self-pay | Admitting: Sports Medicine

## 2017-11-28 ENCOUNTER — Ambulatory Visit (INDEPENDENT_AMBULATORY_CARE_PROVIDER_SITE_OTHER): Payer: Medicare Other | Admitting: Sports Medicine

## 2017-11-28 DIAGNOSIS — M79672 Pain in left foot: Secondary | ICD-10-CM

## 2017-11-28 DIAGNOSIS — M2042 Other hammer toe(s) (acquired), left foot: Secondary | ICD-10-CM

## 2017-11-28 DIAGNOSIS — D3610 Benign neoplasm of peripheral nerves and autonomic nervous system, unspecified: Secondary | ICD-10-CM

## 2017-11-28 DIAGNOSIS — M779 Enthesopathy, unspecified: Secondary | ICD-10-CM

## 2017-11-28 NOTE — Progress Notes (Signed)
Subjective: Alexandra Lindsey is a 77 y.o. female patient who returns to office for evaluation of left foot pain. Patient reports that her foot was doing good until last week started to feel some pain that came back.  Patient requesting another injection. No other issues noted.   Patient Active Problem List   Diagnosis Date Noted  . Right hip pain 10/23/2017  . Positive colorectal cancer screening using Cologuard test 03/26/2017  . 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 Lumbar radiculopathy 09/13/2015  . Greater trochanteric bursitis of right hip 08/04/2015  . Greater trochanteric bursitis of left hip 02/17/2015  . Piriformis syndrome of left side 11/05/2014  . SI (sacroiliac) joint dysfunction 11/05/2014  . Vitamin D deficiency 09/06/2014  . Medication management 09/06/2014  . Hypertension   . GERD (gastroesophageal reflux disease)   . Prediabetes   . 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 Parkinson's disease ([place])   . 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 Hyperlipidemia 10/07/2012    Current Outpatient Medications on File Prior to Visit  Medication Sig Dispense Refill  . acetaminophen (TYLENOL) 500 MG tablet Take 1,000 mg by mouth 2 (two) times daily as needed for moderate pain or headache.    . carbidopa-levodopa (SINEMET IR) 25-100 MG tablet TAKE 3 TABLETS  5AM, TAKE 2 TABLETS AT9AM, TAKE 1 TABLET AT 1PM, TAKE 2 TABLETS AT 5PM, AND TAKE 1 TABLET AT NIGHT, AND TAKE EXTRA TAB PRN 1000 tablet 1  . Cholecalciferol (VITAMIN D3) 5000 units CAPS Take 5,000 Units by mouth 2 (two) times daily.    . cyclobenzaprine (FLEXERIL) 5 MG tablet Take 1-2 tablets (5-10 mg total) by mouth 3 (three) times daily as needed for muscle spasms. 30 tablet 0  . docusate sodium (COLACE) 100 MG capsule Take 1 capsule (100 mg total) every 12 (twelve) hours by mouth. 30 capsule 0  . gabapentin (NEURONTIN) 600 MG tablet Take 1/2 to 1 tablet 3 to 4 x  / day for Chronic & Acute Pain 120 tablet 3  . HYDROcodone-acetaminophen (NORCO/VICODIN) 5-325 MG tablet Take 1-2 tablets by mouth every 4 (four) hours as needed for moderate pain. 60 tablet 0  . hydrocortisone cream 1 % Apply 1 application topically daily as needed for itching.    [name] HYDROmorphone (DILAUDID) 2 MG tablet Take 0.5-1 tablets (1-2 mg total) every 4 (four) hours as needed by mouth for severe pain. 20 tablet 0  . ibuprofen (ADVIL,MOTRIN) 600 MG tablet Take 1 tablet (600 mg total) every 6 (six) hours as needed by mouth. 30 tablet 0  . ondansetron (ZOFRAN) 4 MG tablet Take 1 tablet (4 mg total) every 8 (eight) hours as needed by mouth for nausea or vomiting. 10 tablet 0  . pramipexole (MIRAPEX) 1 MG tablet TAKE 1 TABLET BY MOUTH 3 TIMES DAILY 270 tablet 1  . predniSONE (DELTASONE) 20 MG tablet 1 tab 3 x day for 3 days, then 1 tab 2 x day for 3 days, then 1 tab 1 x day for 5 days 20 tablet 0  . ranitidine (ZANTAC) 150 MG tablet Take 150 mg by mouth daily as needed for heartburn.     . vitamin B-12 (CYANOCOBALAMIN) 1000 MCG tablet Take 1,000 mcg by mouth 2 (two) times a week.      No current facility-administered medications on file prior to visit.     Allergies  Allergen Reactions  . Macrobid [Nitrofurantoin Macrocrystal] Swelling and Rash  . Baclofen Other (See Comments)  Kept her awake, affected memory  . Cephalosporins Other (See Comments)    Cannot sleep  . Codeine Nausea Only    dizziness  . Darvocet [Propoxyphene N-Acetaminophen] Nausea And Vomiting  . Diclofenac Rash    The gel caused a rash  . Levaquin [Levofloxacin] Hives  . Penicillins Rash  . Selegiline Hcl Rash  . Tramadol Nausea And Vomiting  . Triamcinolone Itching   Family History  Problem Relation Age of Onset  . Heart attack Mother   . Esophageal cancer Mother   . CVA Brother    Social History   Socioeconomic History  . Marital status: Married    Spouse name: None  . Number of children: None  .  Years of education: None  . Highest education level: None  Social Needs  . Financial resource strain: None  . Food insecurity - worry: None  . Food insecurity - inability: None  . Transportation needs - medical: None  . Transportation needs - non-medical: None  Occupational History  . Occupation: retired    Comment: Chiropractor  Tobacco Use  . Smoking status: Never Smoker  . Smokeless tobacco: Never Used  Substance and Sexual Activity  . Alcohol use: No    Alcohol/week: 0.0 oz  . Drug use: No  . Sexual activity: None  Other Topics Concern  . None  Social History Narrative  . None   Past Surgical History:  Procedure Laterality Date  . ABDOMINAL HYSTERECTOMY    . APPENDECTOMY    . BACK SURGERY  12/20/2015  . BUNIONECTOMY Bilateral 11-11-3  . CARDIAC CATHETERIZATION Bilateral 2013  . COLONOSCOPY    . HERNIA REPAIR    . LEFT HEART CATHETERIZATION WITH CORONARY ANGIOGRAM N/A 07/03/2012   Procedure: LEFT HEART CATHETERIZATION WITH CORONARY ANGIOGRAM;  Surgeon: Troy Sine, MD;  Location: [facility] CATH LAB;  Service: Cardiovascular;  Laterality: N/A;  . REPAIR OF CEREBROSPINAL FLUID LEAK N/A 12/24/2015   Procedure: LUMBAR WOUND EXPLORATION, REPAIR OF CEREBROSPINAL FLUID LEAK, PLACEMENT OF LUMBAR DRAIN;  Surgeon: Kevan Ny Ditty, MD;  Location: [facility];  Service: Neurosurgery;  Laterality: N/A;  . ROTATOR CUFF REPAIR Left 10-07-12   Objective:  General: Alert and oriented x3 in no acute distress  Dermatology: Old surgical scars well-healed No open lesions bilateral lower extremities, no webspace macerations, no ecchymosis bilateral, all nails x 10 are well manicured.  Vascular: Dorsalis Pedis and Posterior Tibial pedal pulses palpable, Capillary Fill Time 3 seconds,(+) scant pedal hair growth bilateral, focal edema , left foot at second and third toes, Temperature gradient within normal limits.  Neurology: Johney Maine sensation intact via light touch bilateral. (- )Tinels sign  bilateral.   Musculoskeletal: Mild tenderness with palpation at base of the second toe and second webspace corresponding between the second and third toes, likely consistent with capsulitis with hammertoe deformity on left with previous ultrasound suggestive of neuroma. Strength within normal limits in all groups bilateral.    Assessment and Plan: Problem List Items Addressed This Visit    None    Visit Diagnoses    Capsulitis    -  Primary   Neuroma       Hammertoe of left foot       Left foot pain           -Complete examination performed -Re-Discussed conservative care for capsulitis with hammertoe with recurrent neuroma -After oral consent and aseptic prep, injected a mixture containing 1 ml of 2% plain lidocaine, 1 ml 0.5% plain  marcaine, 0.5 ml of kenalog 10 and 0.5 ml of dexamethasone phosphate into left 2nd webspace at area of neuroma without complication. Post-injection care discussed with patient.  -Recommend icing, pads, and good supportive shoes/postop shoe as needed -Patient to return to office as needed or sooner if condition worsens.  Landis Martins, DPM

## 2017-12-14 DIAGNOSIS — M4726 Other spondylosis with radiculopathy, lumbar region: Secondary | ICD-10-CM | POA: Diagnosis not present

## 2017-12-14 DIAGNOSIS — M5416 Radiculopathy, lumbar region: Secondary | ICD-10-CM | POA: Diagnosis not present

## 2017-12-14 DIAGNOSIS — Z981 Arthrodesis status: Secondary | ICD-10-CM | POA: Diagnosis not present

## 2017-12-14 DIAGNOSIS — M5136 Other intervertebral disc degeneration, lumbar region: Secondary | ICD-10-CM | POA: Diagnosis not present

## 2017-12-25 NOTE — Progress Notes (Signed)
Alexandra Lindsey was seen today in the movement disorders clinic for neurologic consultation at the request of Unk Pinto, MD.  The patient has previously seen both Dr. Erling Cruz and Dr. Rexene Alberts.  The records that were available to me were reviewed.  The consultation is for the evaluation and treatment of Parkinson's disease.  The patient has had a diagnosis of Parkinson's disease since at least 2002.  The first symptom was inability to get out of a chair and dragging the L leg and shuffling.  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    The patient was placed on Mirapex in 2002 and remains on that medication.  Once she was started on that medication she could immediately clog again.  She is currently on 1 mg 3 times a day.  She was placed on Stalevo 3 or 4 years later and is currently on Stalevo 100 mg, one tablet at 6 AM, half a tablet at 9 AM, 1 tablet at noon, half a tablet at 3 PM and one tablet at 6 PM.  In addition, she takes a regular carbidopa/levodopa 25/100 in the AM and chews it.  If she doesn't do that, she shuffles.  She chews that pill and then 45 mins later starts the Stalevo regimen.  She does state that Stalevo has become quite expensive for her and her insurance is going to change and asks about alternatives.    10/06/14 update:  The patient presents today, accompanied by her husband to supplement the history.  Last visit, I discontinued the patient's Stalevo, primarily because of cost and changed her to cupboard up a/levodopa 25/100, and we decided to hold the entacapone component.  She chews one tablet in the morning followed by carbidopa/levodopa 25/100 one tablet at 6 AM, half a tablet at 9 AM, 1 tablet at noon, half a tablet at 3 PM and one tablet at 6 PM.  She admits that she did not even take her 3 PM dose today, and is not sure that she even needs it.  Overall, from a Parkinson standpoint she feels great.  She admits to some  dyskinesia in the lower legs, but states that that is not bothersome.  Her biggest issue continues to be low back pain and left hip pain.  She has been back to Dr. Neomia Dear but did not get much relief.  She subsequently saw Dr. Lorin Mercy at [facility].  She was started on 100 mg of Neurontin at bedtime, but that only helped her sleep.  She states that first thing in the morning she has such pain in the left hip and back that she can barely walk.  01/07/15 update:  Pt f/u today re: PD.  She is on carbidopa/levodopa 25/100.  She chews one tablet in the morning followed by carbidopa/levodopa 25/100 one tablet at 6 AM, half a tablet at 9 AM, 1 tablet at noon, half a tablet at 3 PM and one tablet at 6 PM.  She is also on mirapex 1 mg tid.  She came off of meds today so that I could see what she looks like off.  She feels very stiff.  She feels that she isnt moving well.  She did have one fall since last visit.  She fell off of the stool at her vanity.   She didn't get hurt.   She saw Dr. Tamala Julian since last visit.  I  reviewed those records.  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  She is doing much better in that regard.  03/29/15 update:  Pt states that she is not doing as well as she was because of the back/leg pain again.  She continues to see Dr. Tamala Julian and has had further injections.  This one was in the bursa and she states that this one didn't help.  She is not walking as well.   She states that her PD meds are wearing off before they are due for the next dosing.  She takes one full tablet of levodopa at 5 AM (chewed), one tablet at 8 AM, half a tablet at 11 AM, 1 tablet at 1 PM, half a tablet at 4 PM and one tablet at 6 PM.  She remains on pramipexole 1.0 mg 3 times per day.  She just does not feel like she has the energy that she use to.  She has very little dyskinesia.  She has not had any falls.  05/25/15 update:  Pt is f/u today. I increased her  carbidopa/levodopa 25/100 to one tablet 6 times a day and she is on pramipexole 1.0 mg tid.  I referred her for neurorehab but she declined and she did not want therapy.  She has been to see Dr Tamala Julian and she had a SI joint injection.  She states that she uses a bar of soap in sheets for leg cramps and started putting that on her hip.  She now lathers soap on her hip and thinks that is why her hip is virtually pain free.  She also states that she was having weak spells and her gabapentin was d/c and she is doing better.  She is trying to exercise some at home (leg lifts, sit ups, dancing/clogging).  She is overall feeling great and better than she has is over a year.  She is very pleased.  10/07/15 update:  The patient is following up today regarding her Parkinson's disease.  She remains on pramipexole, 1.0 mg 3 times a day.  She is also on carbidopa/levodopa 1 tablet at 5 AM/8 AM/11 AM/1 PM/4 PM/6 PM, for a total of 600 mg of levodopa per day.  She has had some shuffling and the AM is the worst.  Also, whenever she gets up in the middle of the night to go to the bathroom, she can hardly get off of the bed and has trouble moving.  No falls since our last visit.  She has been trying to exercise/dance but that has been more troublesome lately.  No hallucinations but does have some "bad dreams."   I did review her records since our last visit.  She has seen Dr. Hulan Saas 2 times since our last visit regarding her hip and low back pain as well as SI joint dysfunction.  He ordered an EMG which was done by Dr. Posey Pronto on 09/21/2015.  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  She states that she is rubbing bar soap on it and she feels that this helps tremendously.  She did d/c baclofen because it was causing cognitive dulling.  She states that she d/c gabapentin as well as made her feel weak and without energy.    02/17/16 update:  The patient is following up today regarding her Parkinson's disease.  She remains on  pramipexole, 1.0 mg 3 times a day.  She is also on carbidopa/levodopa 1 tablet at 5 AM/8 AM/11 AM/1 PM/4  PM/6 PM, and I added carbidopa/levodopa 50/200 last visit.  She called me and stated that this made her worse, and I did not think that this made much sense, but held the medication and she still felt worse, so I asked her to follow-up with her primary care physician to make sure nothing else was going wrong.  She did follow-up with her primary care physician, but ultimately ended up staying off of the medication.  I did review her records since our last visit.  She underwent lumbar decompression surgery and was hospitalized from 12/20/2015 to 12/22/2015.  This was done by Dr. Sherwood Gambler.  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  She reports that she doesn't remember 13 days but she is much better.  She never experienced a set back with her Parkinsons disease.  She did fall during that 2nd hospital stay attempting to get up by herself and then she fell at the SNF during rehab but didn't get hurt.  After SNF rehab, she had [facility] at the home and they just stopped therapy a week and a half ago.    05/16/16 update:  The patient is following up today regarding her Parkinson's disease.  She remains on pramipexole, 1.0 mg 3 times a day.  She was on carbidopa/levodopa 1 tablet at 5 AM/8 AM/11 AM/1 PM/4 PM/6 PM but since last visit she changed the timing of the dosage (5am/6am/11am/12pm/4pm/6pm).  She had restarted her carbidopa/levodopa 50/200 at night, but was only on it for about 3 days and then she discontinued it.  She then called back and stated that she was worse, but also admits that she has held her morning 2 dosages of levodopa.  She stayed off of the night levodopa as she said that it made worse, even though she had called [place] after that was d/c  to say she was worse.  I told her she needed to stay on a regular schedule and recommended physical therapy.  She wanted to try water aerobics instead of formal physical therapy.   She states that she is better today but overall feels worse over the last month or so.  She is stiff and having trouble moving.  Mornings are definitely the biggest problem.  Can have issue in the afternoon but that timing/frequency isn't consistent like the AM.  Uses walker when goes out.  Saw Dr. Rita Ohara last week and told that back issues were not cause of her problem, although when she bends over or tries to get up, she still has pain.    08/16/16 update:   The patient follows up today regarding Parkinson's disease.  We changed the way she dosed her carbidopa/levodopa 25/100 to, 3 tablets between 5-6am/2 at 9am/1 at 1pm/2 at 5pm.  Reports that this was helpful.  She is also on pramipexole, 1 mg 3 times per day.   Reports that she is much better.  She thinks that all of her deterioration was just from taking a nighttime CR of levodopa.  She is walking somewhat.   She denies sleep attacks.  Denies compulsive behaviors.  She denies hallucinations.  She has had no falls since our last visit.  She fell a few times.  With one time her feet froze and she fell forward.  With one, she was in the grass and went to stand up and fell back.  With one, she was in the kitchen and fell.  Can't get her walker through doors and asks me for new RX.  She had lab work after our last visit including a urinalysis and chemistry which were normal.  She had a TSH by her primary care physician about a week after our visit and that was normal.  States that for the last 2 weeks she has had urinary frequency.  Went to UC and started on doxycyline and then had call after the cx and told she didn't need it.  Has had urinary incontinence.  Reports that her back is very slowly getting better.    01/12/17 update:  Patient follows up today.  She is on  carbidopa/levodopa 25/100, 3 tablets between 5 and 6 AM/2 tablets at 9 AM/1 tablet at 1 PM/2 tablets at 5 PM and then will take another at bedtime.  She is on pramipexole 1 mg 3 times per day.  Last visit, I talked to her about Apokyn injections for freezing, and she did not think that she was interested, but called back after her last visit and stated that she was not doing well and was interested in the [place].  We sent her the release of information form to get her started on it but she didn't send it back.  She states that she just decided she didn't want to do that.  She has had no hallucinations.  No lightheadedness or near syncope.  She did see [facility] on 08/17/2016.  I reviewed those notes, which I received just yesterday (called and requested them).  She was started on Myrbetriq.  She states that she didn't need it and the bladder got better.  She did put a "potty chair" next to her bed so she didn't have to walk so far in the middle of the night.  She is having more problems with her back and she an MRI and CT of the back with Dr. Rita Ohara.  She has fallen several times.  She thinks her back is the reason for the falls.  Not doing therapy and Dr. Rita Ohara told her to wait on that for the scans.  05/16/17 update:  Pt f/u today.  She is on carbidopa/levodopa 25/100, 3 tablets at 5:00 am, 2 tablets at 9:00 am, 1 tablet at 1:00 pm, 2 tablets at 5:00 pm and one at night.  She remains on pramipexole 1mg  tid.    Not lightheaded or dizzy.  Has had several falls (about 5) since our last visit.  Never had a fall with a walker/cane.    09/19/17 update: Patient seen in follow-up today regarding her Parkinson's disease.  She remains on carbidopa/levodopa 25/100, 3 tablets at 5 AM, 2 tablets at 9 AM, 1 tablet at 1 PM, 2 tablets at 5 PM and 1 tablet at bedtime.  She is also on pramipexole 1 mg 3 times per day.  She has had no lightheadedness or near syncope.  No compulsive behaviors.  She has had 7 falls in  last few months - her hands were not on the walker when it happened.  She has had some episodes of freezing, which have been worse when she is under stress.  No sleep attacks.  Her biggest issue has been low back pain.  She has been following with [facility].  I do not have any notes in that regard.  She reports that she is having back surgery next week.  12/25/17 update:  Pt seen in f/u for  PD.  Requested work in today.  On carbidopa/levodopa 25/100, 3 tablets at 5 AM, 2 tablets at 9 AM, 1 tablet at 1 PM, 2 tablets at 5 PM and 1 tablet at bedtime.  She is also on pramipexole, 1 mg 3 times per day.  She had back surgery on November 1 with Dr. Rita Ohara.  The records that were made available to me were reviewed.  Interestingly, neurology was consulted post surgery for dyskinesia and it stated that she denied history of dyskinesia (patient has had significant dyskinesia).  She had significant difficulty with postoperative pain.  CT of the lumbar spine on October 26, 2017 was concerning for loosening of screws.  She states that for the first time last week she had a good week, with less pain.  She really doesn't want the hardware removed even though it was advised she do so.  Fortunately, she states that her PD sx's have been really good.  Her f/u appt with dr. Rita Ohara is 01/18/18.  She is on gabapentin - 600mg  - 1/2 po bid.    She fell a few weeks ago.  Her feet froze and she reached out and hurt her right shoulder.  She is having L elbow pain as well.    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 Macrobid [Nitrofurantoin Macrocrystal] Swelling and Rash  . Baclofen Other (See Comments)    Kept her awake, affected memory  . Cephalosporins Other  (See Comments)    Cannot sleep  . Codeine Nausea Only    dizziness  . Darvocet [Propoxyphene N-Acetaminophen] Nausea And Vomiting  . Diclofenac Rash    The gel caused a rash  . Levaquin [Levofloxacin] Hives  . Penicillins Rash  . Selegiline Hcl Rash  . Tramadol Nausea And Vomiting  . Triamcinolone Itching    CURRENT MEDICATIONS:  Outpatient Encounter Medications as of 12/26/2017  Medication Sig  . acetaminophen (TYLENOL) 500 MG tablet Take 1,000 mg by mouth 2 (two) times daily as needed for moderate pain or headache.  . carbidopa-levodopa (SINEMET IR) 25-100 MG tablet TAKE 3 TABLETS  5AM, TAKE 2 TABLETS AT9AM, TAKE 1 TABLET AT 1PM, TAKE 2 TABLETS AT 5PM, AND TAKE 1 TABLET AT NIGHT, AND TAKE EXTRA TAB PRN  . Cholecalciferol (VITAMIN D3) 5000 units CAPS Take 5,000 Units by mouth 2 (two) times daily.  [name] gabapentin (NEURONTIN) 600 MG tablet Take 1/2 to 1 tablet 3 to 4 x / day for Chronic & Acute Pain (Patient taking differently: Take 300 mg by mouth 2 (two) times daily. )  . hydrocortisone cream 1 % Apply 1 application topically daily as needed for itching.  [name] ibuprofen (ADVIL,MOTRIN) 200 MG tablet Take 200 mg by mouth 3 (three) times daily.  . polyethylene glycol (MIRALAX / GLYCOLAX) packet Take 17 g by mouth daily.  . pramipexole (MIRAPEX) 1 MG tablet TAKE 1 TABLET BY MOUTH 3 TIMES DAILY  . ranitidine (ZANTAC) 150 MG tablet Take 150 mg by mouth daily as needed for heartburn.   . [DISCONTINUED] cyclobenzaprine (FLEXERIL) 5 MG tablet Take 1-2 tablets (5-10 mg total) by mouth 3 (three) times daily as needed for muscle spasms.  . [DISCONTINUED] docusate sodium (COLACE) 100 MG capsule Take 1 capsule (100 mg  total) every 12 (twelve) hours by mouth.  . [DISCONTINUED] HYDROcodone-acetaminophen (NORCO/VICODIN) 5-325 MG tablet Take 1-2 tablets by mouth every 4 (four) hours as needed for moderate pain.  . [DISCONTINUED] HYDROmorphone (DILAUDID) 2 MG tablet Take 0.5-1 tablets (1-2 mg total) every 4  (four) hours as needed by mouth for severe pain.  . [DISCONTINUED] ibuprofen (ADVIL,MOTRIN) 600 MG tablet Take 1 tablet (600 mg total) every 6 (six) hours as needed by mouth.  . [DISCONTINUED] ondansetron (ZOFRAN) 4 MG tablet Take 1 tablet (4 mg total) every 8 (eight) hours as needed by mouth for nausea or vomiting.  . [DISCONTINUED] predniSONE (DELTASONE) 20 MG tablet 1 tab 3 x day for 3 days, then 1 tab 2 x day for 3 days, then 1 tab 1 x day for 5 days  . [DISCONTINUED] vitamin B-12 (CYANOCOBALAMIN) 1000 MCG tablet Take 1,000 mcg by mouth 2 (two) times a week.    No facility-administered encounter medications on file as of 12/26/2017.     PAST MEDICAL HISTORY:   Past Medical History:  Diagnosis Date  . Anemia    as young girl and teenager  . Arthritis   . Chest pain, unspecified   . Constipation   . Costochondritis   . GERD (gastroesophageal reflux disease)   . Headache    ocular migraines  . History of hiatal hernia   . Hyperlipidemia 10-07-12  . Never smoked tobacco   . Parkinson's disease ([place]) 10-07-12  . Parkinson's disease ([place])   . Prediabetes   . 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 Spinal stenosis     PAST SURGICAL HISTORY:   Past Surgical History:  Procedure Laterality Date  . ABDOMINAL HYSTERECTOMY    . APPENDECTOMY    . BACK SURGERY  12/20/2015  . BACK SURGERY  09/27/2017  . BUNIONECTOMY Bilateral 11-11-3  . CARDIAC CATHETERIZATION Bilateral 2013  . COLONOSCOPY    . HERNIA REPAIR    . LEFT HEART CATHETERIZATION WITH CORONARY ANGIOGRAM N/A 07/03/2012   Procedure: LEFT HEART CATHETERIZATION WITH CORONARY ANGIOGRAM;  Surgeon: Troy Sine, MD;  Location: [facility] CATH LAB;  Service: Cardiovascular;  Laterality: N/A;  . REPAIR OF CEREBROSPINAL FLUID LEAK N/A 12/24/2015   Procedure: LUMBAR WOUND EXPLORATION, REPAIR OF CEREBROSPINAL FLUID LEAK, PLACEMENT OF LUMBAR DRAIN;  Surgeon: Kevan Ny Ditty, MD;  Location: [facility];  Service: Neurosurgery;  Laterality:  N/A;  . ROTATOR CUFF REPAIR Left 10-07-12    SOCIAL HISTORY:   Social History   Socioeconomic History  . Marital status: Married    Spouse name: Not on file  . Number of children: Not on file  . Years of education: Not on file  . Highest education level: Not on file  Social Needs  . Financial resource strain: Not on file  . Food insecurity - worry: Not on file  . Food insecurity - inability: Not on file  . Transportation needs - medical: Not on file  . Transportation needs - non-medical: Not on file  Occupational History  . Occupation: retired    Comment: Chiropractor  Tobacco Use  . Smoking status: Never Smoker  . Smokeless tobacco: Never Used  Substance and Sexual Activity  . Alcohol use: No    Alcohol/week: 0.0 oz  . Drug use: No  . Sexual activity: Not on file  Other Topics Concern  . Not on file  Social History Narrative  . Not on file    FAMILY HISTORY:   Family Status  Relation Name  Status  . Mother  Deceased       esophageal cancer, heart attack  . Father  Deceased       suicide  . Brother  Deceased       suicide  . Brother  Deceased       HTN, hypercholesterolemia  . Brother  Alive       mental retardation    ROS:      A complete 10 system review of systems was obtained and was unremarkable apart from what is mentioned above.  PHYSICAL EXAMINATION:    VITALS:   Vitals:   12/26/17 0948  BP: 120/88  Pulse: 82  SpO2: 96%  Weight: 137 lb (62.1 kg)  Height: 5' (1.524 m)   No data found.    GEN:  The patient appears stated age and is in NAD. HEENT:  Normocephalic, atraumatic.  The mucous membranes are moist. The superficial temporal arteries are without ropiness or tenderness. CV:  RRR Lungs:  CTAB Neck/HEME:  There are no carotid bruits bilaterally.  Neurological examination:  Orientation: The patient is alert and oriented x3.  Cranial nerves: There is good facial symmetry.  No significant facial hypomimia.   Extraocular muscles are  intact.  There are no square wave jerks.  The visual fields are full to confrontational testing. The speech is fluent and clear. Soft palate rises symmetrically and there is no tongue deviation. Hearing is intact to conversational tone. Sensation: Sensation is intact to light touch throughout. Motor: Strength is 5/5 in the bilateral upper and lower extremities.   Shoulder shrug is equal and symmetric.  There is no pronator drift.  Movement examination: Tone: There is normal tone in the UE/LE Abnormal movements: there is mod dyskinesia in the trunk and left leg Coordination:  There is decremation with toe taps on the L.   Gait and Station: The patient has no difficulty getting out of the chair.  Stride length is good.  She has mildly antalgic because of back pain.  ASSESSMENT/PLAN:  1.  Idiopathic PD.    -continue Carbidopa Levodopa as follows: 3 tablets at 5:00 am, 2 tablets at 9:00 am, 1 tablet at 1:00 pm, 2 tablets at 5:00 pm and one at night.  -take extra carbidopa/levodopa 25/100 prn   -Talked to her about risks of mirapex 1 mg tid as she ages but didn't change that today.  She has no SE with it.  -I talked to her extensively today, as I have in the past, about using her walker at all times.  She does not fall when she has a walker, but has fallen multiple times since our last visit without the walker. 2.  Urinary incontinence and frequency  -doing better in this regard 3.  Back pain  -She had surgery September 27, 2017 with significant postoperative pain.  She has had loosening of screws and likely radicular pain due to this.  Her pain is slowly getting better and she wants to wait to see what happens.  She has an appt with Dr. Rita Ohara next month to discuss. 4.  RBD.  -  She did not want to take the clonazepam, primarily because of its addictive properties.  She did try it for a short period of time.  She reports doing well in this regard.   5 .  Low blood pressure  -likely autonomic  insufficiency but better today likely due to pain 6 .  Right shoulder pain after fall  -she is f/u with  PCP soon regarding this 7.  Follow up is anticipated in the next few months, sooner should new neurologic issues arise.  Much greater than 50% of this visit was spent in counseling and coordinating care.  Total face to face time:  25 min

## 2017-12-26 ENCOUNTER — Ambulatory Visit (INDEPENDENT_AMBULATORY_CARE_PROVIDER_SITE_OTHER): Payer: Medicare Other | Admitting: Neurology

## 2017-12-26 ENCOUNTER — Encounter: Payer: Self-pay | Admitting: Internal Medicine

## 2017-12-26 ENCOUNTER — Encounter: Payer: Self-pay | Admitting: Neurology

## 2017-12-26 VITALS — BP 120/88 | HR 82 | Ht 60.0 in | Wt 137.0 lb

## 2017-12-26 DIAGNOSIS — G20 Parkinson's disease: Secondary | ICD-10-CM

## 2017-12-26 DIAGNOSIS — M5416 Radiculopathy, lumbar region: Secondary | ICD-10-CM

## 2018-01-02 ENCOUNTER — Ambulatory Visit (INDEPENDENT_AMBULATORY_CARE_PROVIDER_SITE_OTHER): Payer: Medicare Other | Admitting: Internal Medicine

## 2018-01-02 VITALS — BP 122/76 | HR 80 | Temp 97.5°F | Resp 16 | Ht 59.0 in | Wt 137.0 lb

## 2018-01-02 DIAGNOSIS — R7309 Other abnormal glucose: Secondary | ICD-10-CM

## 2018-01-02 DIAGNOSIS — R7303 Prediabetes: Secondary | ICD-10-CM | POA: Diagnosis not present

## 2018-01-02 DIAGNOSIS — M7702 Medial epicondylitis, left elbow: Secondary | ICD-10-CM

## 2018-01-02 DIAGNOSIS — Z79899 Other long term (current) drug therapy: Secondary | ICD-10-CM

## 2018-01-02 DIAGNOSIS — E559 Vitamin D deficiency, unspecified: Secondary | ICD-10-CM

## 2018-01-02 DIAGNOSIS — G20 Parkinson's disease: Secondary | ICD-10-CM

## 2018-01-02 DIAGNOSIS — Z136 Encounter for screening for cardiovascular disorders: Secondary | ICD-10-CM

## 2018-01-02 DIAGNOSIS — Z8249 Family history of ischemic heart disease and other diseases of the circulatory system: Secondary | ICD-10-CM | POA: Diagnosis not present

## 2018-01-02 DIAGNOSIS — Z1212 Encounter for screening for malignant neoplasm of rectum: Secondary | ICD-10-CM

## 2018-01-02 DIAGNOSIS — I10 Essential (primary) hypertension: Secondary | ICD-10-CM | POA: Diagnosis not present

## 2018-01-02 DIAGNOSIS — Z1211 Encounter for screening for malignant neoplasm of colon: Secondary | ICD-10-CM

## 2018-01-02 DIAGNOSIS — E782 Mixed hyperlipidemia: Secondary | ICD-10-CM

## 2018-01-02 MED ORDER — DEXAMETHASONE 0.5 MG PO TABS: ORAL_TABLET | ORAL | 0 refills | Status: DC

## 2018-01-02 NOTE — Progress Notes (Signed)
[facility] Unk Pinto, M.D.     Uvaldo Bristle. Silverio Lay, P.A.-C Liane Comber, [place] [address] [place], [place] [address] Telephone [phone] Telefax [phone] Comprehensive Evaluation &  Examination     This very nice 77 y.o.  MWF presents for a  comprehensive evaluation and management of multiple medical co-morbidities.  Patient has been followed for HTN, Prediabetes, Hyperlipidemia and Vitamin D Deficiency. She also has GERD and infreq takes Ranitidine for rescue. She has ongoing c/o pains in the Rt shoulder & Lt elbow.      Patient has Parkinson's Dz beginning about 2002 and is followed closely by Dr [name].       In Jan 2016, she had a Cx epidural cyst removed by Dr Rita Ohara. In Nov 2018, she had Lumbar surg by Dr Rita Ohara and has had increasing pain attributed to "loosening of screws" and had been advised to have the "hardware " removed. She has been increased on Gabapentin with some relief.      Patient has been followed for labile HTN circa 2005 and has been treated in the past w/diuretics. She had Negative Cardiolite Scans in 2008 and 2013.  Patient's BP has been controlled and patient denies any cardiac symptoms as chest pain, palpitations, shortness of breath, dizziness or ankle swelling. Today's BP is at goal - 122/76.     Patient's hyperlipidemia is controlled with diet and medications. Patient denies myalgias or other medication SE's. Last lipids were not at goal  Lab Results  Component Value Date   CHOL 204 (H) 11/17/2016   HDL 54 11/17/2016   LDLCALC 131 (H) 11/17/2016   TRIG 97 11/17/2016   CHOLHDL 3.8 11/17/2016      Patient has prediabetes predating (A1c 6.3%/2011. Then 5.9%/2013)  and patient denies reactive hypoglycemic symptoms, visual blurring, diabetic polys, or paresthesias. Last A1c was Normal & at goal:  Lab Results  Component Value Date   HGBA1C 5.4 11/17/2016       Finally, patient has history of Vitamin D Deficiency ("23"/2008) and last Vitamin D was at goal: Lab Results  Component Value Date   VD25OH 58 11/17/2016   Current Outpatient Medications on File Prior to Visit  Medication Sig  . acetaminophen (TYLENOL) 500 MG tablet Take 1,000 mg by mouth 2 (two) times daily as needed for moderate pain or headache.  . carbidopa-levodopa (SINEMET IR) 25-100 MG tablet TAKE 3 TABLETS  5AM, TAKE 2 TABLETS AT9AM, TAKE 1 TABLET AT 1PM, TAKE 2 TABLETS AT 5PM, AND TAKE 1 TABLET AT NIGHT, AND TAKE EXTRA TAB PRN  . Cholecalciferol (VITAMIN D3) 5000 units CAPS Take 5,000 Units by mouth 2 (two) times daily.  [name] gabapentin (NEURONTIN) 600 MG tablet Take 1/2 to 1 tablet 3 to 4 x / day for Chronic & Acute Pain (Patient taking differently: Take 300 mg by mouth 2 (two) times daily. )  . hydrocortisone cream 1 % Apply 1 application topically daily as needed for itching.  [name] ibuprofen (ADVIL,MOTRIN) 200 MG tablet Take 200 mg by mouth 3 (three) times daily.  . polyethylene glycol (MIRALAX / GLYCOLAX) packet Take 17 g by mouth daily.  . pramipexole (MIRAPEX) 1 MG tablet TAKE 1 TABLET BY MOUTH 3 TIMES DAILY  . ranitidine (ZANTAC) 150 MG tablet Take 150 mg by mouth daily as needed for heartburn.    No current facility-administered medications on file prior to visit.    Allergies  Allergen Reactions  . Macrobid [  Nitrofurantoin Macrocrystal] Swelling and Rash  . Baclofen Other (See Comments)    Kept her awake, affected memory  . Cephalosporins Other (See Comments)    Cannot sleep  . Codeine Nausea Only    dizziness  . Darvocet [Propoxyphene N-Acetaminophen] Nausea And Vomiting  . Diclofenac Rash    The gel caused a rash  . Levaquin [Levofloxacin] Hives  . Penicillins Rash  . Selegiline Hcl Rash  . Tramadol Nausea And Vomiting  . Triamcinolone Itching   Past Medical History:  Diagnosis Date  . Anemia    as young girl and teenager  . Arthritis   . Chest pain,  unspecified   . Constipation   . Costochondritis   . GERD (gastroesophageal reflux disease)   . Headache    ocular migraines  . History of hiatal hernia   . Hyperlipidemia 10-07-12  . Never smoked tobacco   . Parkinson's disease ([place]) 10-07-12  . Parkinson's disease ([place])   . Prediabetes   . 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 Spinal stenosis    Health Maintenance  Topic Date Due  . Fecal DNA (Cologuard)  03/19/2020  . TETANUS/TDAP  09/09/2023  . INFLUENZA VACCINE  Completed  . DEXA SCAN  Completed  . PNA vac Low Risk Adult  Completed   Immunization History  Administered Date(s) Administered  . Influenza, High Dose Seasonal PF 09/08/2014, 10/04/2015  . Influenza-Unspecified 09/29/2016, 08/08/2017  . Pneumococcal Conjugate-13 11/10/2015  . Pneumococcal Polysaccharide-23 02/26/2017  . Pneumococcal-Unspecified 11/27/2006  . Td 09/08/2013  . Zoster 06/27/2006   Last Colon - Had Negative Cologard 03/19/2017 and is due for 3 yr f/u in Apr 2021 Last MGM - 11/16/2017 at [facility] office  Past Surgical History:  Procedure Laterality Date  . ABDOMINAL HYSTERECTOMY    . APPENDECTOMY    . BACK SURGERY  12/20/2015  . BACK SURGERY  09/27/2017  . BUNIONECTOMY Bilateral 11-11-3  . CARDIAC CATHETERIZATION Bilateral 2013  . COLONOSCOPY    . HERNIA REPAIR    . LEFT HEART CATHETERIZATION WITH CORONARY ANGIOGRAM N/A 07/03/2012   Procedure: LEFT HEART CATHETERIZATION WITH CORONARY ANGIOGRAM;  Surgeon: Troy Sine, MD;  [facility];  Service: Cardiovascular;  Laterality: N/A;  . REPAIR OF CEREBROSPINAL FLUID LEAK N/A 12/24/2015   Procedure: LUMBAR WOUND EXPLORATION, REPAIR OF CEREBROSPINAL FLUID LEAK, PLACEMENT OF LUMBAR DRAIN;  Surgeon: Kevan Ny Ditty, MD;  Location: [facility]O [facility];  Service: Neurosurgery;  Laterality: N/A;  . ROTATOR CUFF REPAIR Left 10-07-12   Family History  Problem Relation Age of Onset  . Heart attack Mother   . Esophageal cancer Mother    . CVA Brother    Social History   Tobacco Use  . Smoking status: Never Smoker  . Smokeless tobacco: Never Used  Substance Use Topics  . Alcohol use: No    Alcohol/week: 0.0 oz  . Drug use: No    ROS Constitutional: Denies fever, chills, weight loss/gain, headaches, insomnia,  night sweats, and change in appetite. Does c/o fatigue. Eyes: Denies redness, blurred vision, diplopia, discharge, itchy, watery eyes.  ENT: Denies discharge, congestion, post nasal drip, epistaxis, sore throat, earache, hearing loss, dental pain, Tinnitus, Vertigo, Sinus pain, snoring.  Cardio: Denies chest pain, palpitations, irregular heartbeat, syncope, dyspnea, diaphoresis, orthopnea, PND, claudication, edema Respiratory: denies cough, dyspnea, DOE, pleurisy, hoarseness, laryngitis, wheezing.  Gastrointestinal: Denies dysphagia, heartburn, reflux, water brash, pain, cramps, nausea, vomiting, bloating, diarrhea, constipation, hematemesis, melena, hematochezia, jaundice, hemorrhoids Genitourinary: Denies dysuria, frequency, urgency,  nocturia, hesitancy, discharge, hematuria, flank pain Breast: Breast lumps, nipple discharge, bleeding.  Musculoskeletal: Has neck, Rt shoulder and Lt elbow pain as above.  Denies falls. Skin: Denies puritis, rash, hives, warts, acne, eczema, changing in skin lesion Neuro: No weakness, tremor, incoordination, spasms, paresthesia, pain Psychiatric: Denies confusion, memory loss, sensory loss. Denies Depression. Endocrine: Denies change in weight, skin, hair change, nocturia, and paresthesia, diabetic polys, visual blurring, hyper / hypo glycemic episodes.  Heme/Lymph: No excessive bleeding, bruising, enlarged lymph nodes.  Physical Exam  BP 122/76   Pulse 80   Temp (!) 97.5 F (36.4 C)   Resp 16   Ht 4\' 11"  (1.499 m)   Wt 137 lb (62.1 kg)   BMI 27.67 kg/m   General Appearance: Well nourished, well groomed and in no apparent distress.  Eyes: PERRLA, EOMs, conjunctiva no  swelling or erythema, normal fundi and vessels. Sinuses: No frontal/maxillary tenderness ENT/Mouth: EACs patent / TMs  nl. Nares clear without erythema, swelling, mucoid exudates. Oral hygiene is good. No erythema, swelling, or exudate. Tongue normal, non-obstructing. Tonsils not swollen or erythematous. Hearing normal.  Neck: Supple, thyroid normal. No bruits, nodes or JVD. Respiratory: Respiratory effort normal.  BS equal and clear bilateral without rales, rhonci, wheezing or stridor. Cardio: Heart sounds are normal with regular rate and rhythm and no murmurs, rubs or gallops. Peripheral pulses are normal and equal bilaterally without edema. No aortic or femoral bruits. Chest: symmetric with normal excursions and percussion. Breasts: Symmetric, without lumps, nipple discharge, retractions, or fibrocystic changes.  Abdomen: Flat, soft with bowel sounds active. Nontender, no guarding, rebound, hernias, masses, or organomegaly.  Lymphatics: Non tender without lymphadenopathy.  Musculoskeletal: Full ROM all peripheral extremities, joint stability, 5/5 strength, and normal gait. (+) Tender Lt elbow- medial epicondyle. No Cog-wheeling.  Skin: Warm and dry without rashes, lesions, cyanosis, clubbing or  ecchymosis.  Neuro: Cranial nerves intact, reflexes equal bilaterally. Normal muscle tone, no cerebellar symptoms. Sensation intact. No Tremor.  Pysch: Alert and oriented X 3, normal affect, Insight and Judgment appropriate.   Assessment and Plan  1. Essential hypertension  - EKG 12-Lead - Urinalysis, Routine w reflex microscopic - Microalbumin / creatinine urine ratio - CBC with Differential/Platelet - BASIC METABOLIC PANEL WITH GFR - Magnesium - TSH  2. Hyperlipidemia, mixed  - EKG 12-Lead - Hepatic function panel - Lipid panel - TSH  3. Prediabetes  - EKG 12-Lead - Hemoglobin A1c - Insulin, random  4. Vitamin D deficiency  - VITAMIN D 25 Hydroxy  5. Abnormal glucose  -  Hemoglobin A1c - Insulin, random  6. Encounter for colorectal cancer screening  - POC Hemoccult Bld/Stl   7. Screening for ischemic heart disease  - EKG 12-Lead  8. Family history of ischemic heart disease  - EKG 12-Lead  9. Parkinson's disease ([place])   10. Medication management  - Urinalysis, Routine w reflex microscopic - Microalbumin / creatinine urine ratio - CBC with Differential/Platelet - BASIC METABOLIC PANEL WITH GFR - Hepatic function panel - Magnesium - Lipid panel - TSH - Hemoglobin A1c - Insulin, random - VITAMIN D 25 Hydroxy   11. Medial epicondylitis of elbow, left  - dexamethasone (DECADRON) 0.5 MG tablet; Take 1 tab 3 x day - 3 days, then 2 x day - 3 days, then 1 tab daily  Dispense: 20 tablet; Refill: 0         Patient was counseled in prudent diet to achieve/maintain BMI less than 25 for weight control, BP monitoring,  regular exercise and medications. Discussed med's effects and SE's. Screening labs and tests as requested with regular follow-up as recommended. Over 40 minutes of exam, counseling, chart review and high complex critical decision making was performed.

## 2018-01-02 NOTE — Patient Instructions (Signed)

## 2018-01-03 LAB — HEPATIC FUNCTION PANEL
AG RATIO: 2.1 (calc) (ref 1.0–2.5)
ALT: 6 U/L (ref 6–29)
AST: 13 U/L (ref 10–35)
Albumin: 4.2 g/dL (ref 3.6–5.1)
Alkaline phosphatase (APISO): 98 U/L (ref 33–130)
BILIRUBIN INDIRECT: 0.3 mg/dL (ref 0.2–1.2)
BILIRUBIN TOTAL: 0.3 mg/dL (ref 0.2–1.2)
Bilirubin, Direct: 0 mg/dL (ref 0.0–0.2)
GLOBULIN: 2 g/dL (ref 1.9–3.7)
TOTAL PROTEIN: 6.2 g/dL (ref 6.1–8.1)

## 2018-01-03 LAB — CBC WITH DIFFERENTIAL/PLATELET
Basophils Absolute: 30 cells/uL (ref 0–200)
Basophils Relative: 0.6 %
Eosinophils Absolute: 300 cells/uL (ref 15–500)
Eosinophils Relative: 6 %
HCT: 33.3 % — ABNORMAL LOW (ref 35.0–45.0)
Hemoglobin: 11.2 g/dL — ABNORMAL LOW (ref 11.7–15.5)
Lymphs Abs: 1520 cells/uL (ref 850–3900)
MCH: 29.9 pg (ref 27.0–33.0)
MCHC: 33.6 g/dL (ref 32.0–36.0)
MCV: 88.8 fL (ref 80.0–100.0)
MONOS PCT: 8.4 %
MPV: 10.8 fL (ref 7.5–12.5)
Neutro Abs: 2730 cells/uL (ref 1500–7800)
Neutrophils Relative %: 54.6 %
Platelets: 315 10*3/uL (ref 140–400)
RBC: 3.75 10*6/uL — AB (ref 3.80–5.10)
RDW: 13 % (ref 11.0–15.0)
TOTAL LYMPHOCYTE: 30.4 %
WBC mixed population: 420 cells/uL (ref 200–950)
WBC: 5 10*3/uL (ref 3.8–10.8)

## 2018-01-03 LAB — LIPID PANEL
CHOL/HDL RATIO: 3.8 (calc) (ref <5.0)
CHOLESTEROL: 193 mg/dL (ref <200)
HDL: 51 mg/dL (ref >50)
LDL CHOLESTEROL (CALC): 111 mg/dL — AB
NON-HDL CHOLESTEROL (CALC): 142 mg/dL — AB (ref <130)
Triglycerides: 195 mg/dL — ABNORMAL HIGH (ref <150)

## 2018-01-03 LAB — INSULIN, RANDOM: Insulin: 8.9 u[IU]/mL (ref 2.0–19.6)

## 2018-01-03 LAB — URINALYSIS, ROUTINE W REFLEX MICROSCOPIC
BILIRUBIN URINE: NEGATIVE
GLUCOSE, UA: NEGATIVE
HGB URINE DIPSTICK: NEGATIVE
Leukocytes, UA: NEGATIVE
Nitrite: NEGATIVE
PROTEIN: NEGATIVE
Specific Gravity, Urine: 1.035 (ref 1.001–1.03)

## 2018-01-03 LAB — MICROALBUMIN / CREATININE URINE RATIO
CREATININE, URINE: 166 mg/dL (ref 20–275)
MICROALB UR: 0.5 mg/dL
Microalb Creat Ratio: 3 mcg/mg creat (ref <30)

## 2018-01-03 LAB — HEMOGLOBIN A1C
EAG (MMOL/L): 6 (calc)
Hgb A1c MFr Bld: 5.4 % of total Hgb (ref <5.7)
Mean Plasma Glucose: 108 (calc)

## 2018-01-03 LAB — BASIC METABOLIC PANEL WITH GFR
BUN / CREAT RATIO: 31 (calc) — AB (ref 6–22)
BUN: 27 mg/dL — AB (ref 7–25)
CO2: 26 mmol/L (ref 20–32)
CREATININE: 0.87 mg/dL (ref 0.60–0.93)
Calcium: 9.4 mg/dL (ref 8.6–10.4)
Chloride: 111 mmol/L — ABNORMAL HIGH (ref 98–110)
GFR, EST NON AFRICAN AMERICAN: 65 mL/min/{1.73_m2} (ref >=60)
GFR, Est African American: 76 mL/min/{1.73_m2} (ref >=60)
GLUCOSE: 97 mg/dL (ref 65–99)
Potassium: 4.3 mmol/L (ref 3.5–5.3)
Sodium: 144 mmol/L (ref 135–146)

## 2018-01-03 LAB — MAGNESIUM: Magnesium: 2.2 mg/dL (ref 1.5–2.5)

## 2018-01-03 LAB — TSH: TSH: 0.84 mIU/L (ref 0.40–4.50)

## 2018-01-03 LAB — VITAMIN D 25 HYDROXY (VIT D DEFICIENCY, FRACTURES): Vit D, 25-Hydroxy: 35 ng/mL (ref 30–100)

## 2018-01-06 ENCOUNTER — Encounter: Payer: Self-pay | Admitting: Internal Medicine

## 2018-01-18 ENCOUNTER — Telehealth: Payer: Self-pay | Admitting: Neurology

## 2018-01-18 DIAGNOSIS — R030 Elevated blood-pressure reading, without diagnosis of hypertension: Secondary | ICD-10-CM | POA: Diagnosis not present

## 2018-01-18 DIAGNOSIS — Z981 Arthrodesis status: Secondary | ICD-10-CM | POA: Diagnosis not present

## 2018-01-18 DIAGNOSIS — R531 Weakness: Secondary | ICD-10-CM

## 2018-01-18 DIAGNOSIS — M5136 Other intervertebral disc degeneration, lumbar region: Secondary | ICD-10-CM | POA: Diagnosis not present

## 2018-01-18 DIAGNOSIS — M4726 Other spondylosis with radiculopathy, lumbar region: Secondary | ICD-10-CM | POA: Diagnosis not present

## 2018-01-18 DIAGNOSIS — M5416 Radiculopathy, lumbar region: Secondary | ICD-10-CM | POA: Diagnosis not present

## 2018-01-18 DIAGNOSIS — Z6827 Body mass index (BMI) 27.0-27.9, adult: Secondary | ICD-10-CM | POA: Diagnosis not present

## 2018-01-18 NOTE — Telephone Encounter (Signed)
Pt called and said she is having a hard time walking and wants a call back

## 2018-01-18 NOTE — Telephone Encounter (Signed)
Not sure what is causing these spells.  OK to take extra 0.5 - 1 tab levadopa at 1pm or at the onset of these spells.  Let's also check vitamin B12 level.  She had labs 2 weeks ago which did not show signs of infection.  TSH is normal.

## 2018-01-18 NOTE — Telephone Encounter (Signed)
[phone] please use this number to call patient back

## 2018-01-18 NOTE — Telephone Encounter (Signed)
Spoke with patient. She states for the past week she has been feeling weak and like she can't walk. Symptoms have come on all the sudden and she describes it as similar to an episode she had a year ago.   She is off pain medication and only taking tylenol/ibuprofen and half a gabapentin at night. She has been off pain meds from back surgery for over five weeks.   She states mornings are okay, but symptoms seem to develop in the afternoon and evening. They last anywhere from 1-3 hours. She states entire body is weak, but notices it the most in her legs and she is only able to shuffle her feet to walk. She states some shortness of breath. Occasional feelings of lightheadedness. No dizziness. Only thing that seems to help is chewing a Levodopa tablet or when she eats she feels a little better.   She has been dehydrated in the past and she has made sure she is staying hydrated. She is checking blood pressures and during episodes of weakness her blood pressure is running lower (90's/55's).   She has not changed any medications. Please advise.

## 2018-01-18 NOTE — Telephone Encounter (Signed)
Patient made aware. Order entered for B12 to be checked. She will come by our office next week to have this drawn.

## 2018-01-23 NOTE — Telephone Encounter (Signed)
Is she on list for inbrija?  Is this available yet?

## 2018-01-23 NOTE — Telephone Encounter (Signed)
I will add her to the list. It is not available yet.

## 2018-01-24 ENCOUNTER — Ambulatory Visit: Payer: Medicare Other | Admitting: Neurology

## 2018-02-06 ENCOUNTER — Telehealth: Payer: Self-pay | Admitting: Neurology

## 2018-02-06 NOTE — Telephone Encounter (Signed)
Patient made aware inbrija is available, but will need her signature to send information to the [facility]. She will come by and sign paperwork.

## 2018-02-12 NOTE — Telephone Encounter (Signed)
Paperwork signed and faxed to [facility].

## 2018-02-13 ENCOUNTER — Telehealth: Payer: Self-pay | Admitting: Neurology

## 2018-02-13 NOTE — Telephone Encounter (Signed)
1.     Name of medication: Inbrija  2.     How are you currently taking this medication (dosage and times per day)   3.     Are you having a reaction (difficulty breathing--STAT)        What is your medication issue? Needs instructions and is it being called in to pt's pharmacy

## 2018-02-14 NOTE — Telephone Encounter (Signed)
Spoke with patient. She has done some research on medication and states it will be too expensive for her due to her husband's cancer treatments being so expensive. Aware Inbrija forms have been sent and approval for insurance received valid through 11/26/18. If she changes her mind she will let us know.

## 2018-02-14 NOTE — Telephone Encounter (Signed)
Left message on machine for patient to call back.

## 2018-02-14 NOTE — Telephone Encounter (Signed)
1.     Name of medication: Inbrija  2.     How are you currently taking this medication (dosage and times per day)?   3.     Are you having a reaction (difficulty breathing--STAT)? No  4.     What is your medication issue? Patient lmom stating that she would like more information on the drug and it is something that she does not want to do at this time. Thanks

## 2018-03-18 ENCOUNTER — Telehealth: Payer: Self-pay | Admitting: Neurology

## 2018-03-18 NOTE — Telephone Encounter (Signed)
Pt called and said she is having terrible days with her Parkinson's and she said there is a new drug out and wanted to know if she might try that, wanted a call back from [place] to talk about this

## 2018-03-18 NOTE — Telephone Encounter (Signed)
Spoke with patient. She is interested in [place]. She was going to start medication a few weeks ago and decided she didn't want it. She will call [employer] about price of medication.   She states everyday at 1:00 pm she starts having issues with walking. She is taking her Carbidopa Levodopa as instructed, but not taking her extra Levodopa tablet she can have daily. I told her to try to take the extra tablet at 1 pm with her other scheduled dose and see if that helps.   She will let us know how she does.

## 2018-03-19 NOTE — Progress Notes (Signed)
Alexandra Lindsey was seen today in the movement disorders clinic for neurologic consultation at the request of Unk Pinto, MD.  The patient has previously seen both Dr. Erling Cruz and Dr. Rexene Alberts.  The records that were available to me were reviewed.  The consultation is for the evaluation and treatment of Parkinson's disease.  The patient has had a diagnosis of Parkinson's disease since at least 2002.  The first symptom was inability to get out of a chair and dragging the L leg and shuffling.  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    The patient was placed on Mirapex in 2002 and remains on that medication.  Once she was started on that medication she could immediately clog again.  She is currently on 1 mg 3 times a day.  She was placed on Stalevo 3 or 4 years later and is currently on Stalevo 100 mg, one tablet at 6 AM, half a tablet at 9 AM, 1 tablet at noon, half a tablet at 3 PM and one tablet at 6 PM.  In addition, she takes a regular carbidopa/levodopa 25/100 in the AM and chews it.  If she doesn't do that, she shuffles.  She chews that pill and then 45 mins later starts the Stalevo regimen.  She does state that Stalevo has become quite expensive for her and her insurance is going to change and asks about alternatives.    10/06/14 update:  The patient presents today, accompanied by her husband to supplement the history.  Last visit, I discontinued the patient's Stalevo, primarily because of cost and changed her to cupboard up a/levodopa 25/100, and we decided to hold the entacapone component.  She chews one tablet in the morning followed by carbidopa/levodopa 25/100 one tablet at 6 AM, half a tablet at 9 AM, 1 tablet at noon, half a tablet at 3 PM and one tablet at 6 PM.  She admits that she did not even take her 3 PM dose today, and is not sure that she even needs it.  Overall, from a Parkinson standpoint she feels great.  She admits to some  dyskinesia in the lower legs, but states that that is not bothersome.  Her biggest issue continues to be low back pain and left hip pain.  She has been back to Dr. Neomia Dear but did not get much relief.  She subsequently saw Dr. Lorin Mercy at [facility].  She was started on 100 mg of Neurontin at bedtime, but that only helped her sleep.  She states that first thing in the morning she has such pain in the left hip and back that she can barely walk.  01/07/15 update:  Pt f/u today re: PD.  She is on carbidopa/levodopa 25/100.  She chews one tablet in the morning followed by carbidopa/levodopa 25/100 one tablet at 6 AM, half a tablet at 9 AM, 1 tablet at noon, half a tablet at 3 PM and one tablet at 6 PM.  She is also on mirapex 1 mg tid.  She came off of meds today so that I could see what she looks like off.  She feels very stiff.  She feels that she isnt moving well.  She did have one fall since last visit.  She fell off of the stool at her vanity.   She didn't get hurt.   She saw Dr. Tamala Julian since last visit.  I  reviewed those records.  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  She is doing much better in that regard.  03/29/15 update:  Pt states that she is not doing as well as she was because of the back/leg pain again.  She continues to see Dr. Tamala Julian and has had further injections.  This one was in the bursa and she states that this one didn't help.  She is not walking as well.   She states that her PD meds are wearing off before they are due for the next dosing.  She takes one full tablet of levodopa at 5 AM (chewed), one tablet at 8 AM, half a tablet at 11 AM, 1 tablet at 1 PM, half a tablet at 4 PM and one tablet at 6 PM.  She remains on pramipexole 1.0 mg 3 times per day.  She just does not feel like she has the energy that she use to.  She has very little dyskinesia.  She has not had any falls.  05/25/15 update:  Pt is f/u today. I increased her  carbidopa/levodopa 25/100 to one tablet 6 times a day and she is on pramipexole 1.0 mg tid.  I referred her for neurorehab but she declined and she did not want therapy.  She has been to see Dr Tamala Julian and she had a SI joint injection.  She states that she uses a bar of soap in sheets for leg cramps and started putting that on her hip.  She now lathers soap on her hip and thinks that is why her hip is virtually pain free.  She also states that she was having weak spells and her gabapentin was d/c and she is doing better.  She is trying to exercise some at home (leg lifts, sit ups, dancing/clogging).  She is overall feeling great and better than she has is over a year.  She is very pleased.  10/07/15 update:  The patient is following up today regarding her Parkinson's disease.  She remains on pramipexole, 1.0 mg 3 times a day.  She is also on carbidopa/levodopa 1 tablet at 5 AM/8 AM/11 AM/1 PM/4 PM/6 PM, for a total of 600 mg of levodopa per day.  She has had some shuffling and the AM is the worst.  Also, whenever she gets up in the middle of the night to go to the bathroom, she can hardly get off of the bed and has trouble moving.  No falls since our last visit.  She has been trying to exercise/dance but that has been more troublesome lately.  No hallucinations but does have some "bad dreams."   I did review her records since our last visit.  She has seen Dr. Hulan Saas 2 times since our last visit regarding her hip and low back pain as well as SI joint dysfunction.  He ordered an EMG which was done by Dr. Posey Pronto on 09/21/2015.  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  She states that she is rubbing bar soap on it and she feels that this helps tremendously.  She did d/c baclofen because it was causing cognitive dulling.  She states that she d/c gabapentin as well as made her feel weak and without energy.    02/17/16 update:  The patient is following up today regarding her Parkinson's disease.  She remains on  pramipexole, 1.0 mg 3 times a day.  She is also on carbidopa/levodopa 1 tablet at 5 AM/8 AM/11 AM/1 PM/4  PM/6 PM, and I added carbidopa/levodopa 50/200 last visit.  She called me and stated that this made her worse, and I did not think that this made much sense, but held the medication and she still felt worse, so I asked her to follow-up with her primary care physician to make sure nothing else was going wrong.  She did follow-up with her primary care physician, but ultimately ended up staying off of the medication.  I did review her records since our last visit.  She underwent lumbar decompression surgery and was hospitalized from 12/20/2015 to 12/22/2015.  This was done by Dr. Sherwood Gambler.  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  She reports that she doesn't remember 13 days but she is much better.  She never experienced a set back with her Parkinsons disease.  She did fall during that 2nd hospital stay attempting to get up by herself and then she fell at the SNF during rehab but didn't get hurt.  After SNF rehab, she had [facility] at the home and they just stopped therapy a week and a half ago.    05/16/16 update:  The patient is following up today regarding her Parkinson's disease.  She remains on pramipexole, 1.0 mg 3 times a day.  She was on carbidopa/levodopa 1 tablet at 5 AM/8 AM/11 AM/1 PM/4 PM/6 PM but since last visit she changed the timing of the dosage (5am/6am/11am/12pm/4pm/6pm).  She had restarted her carbidopa/levodopa 50/200 at night, but was only on it for about 3 days and then she discontinued it.  She then called back and stated that she was worse, but also admits that she has held her morning 2 dosages of levodopa.  She stayed off of the night levodopa as she said that it made worse, even though she had called [place] after that was d/c  to say she was worse.  I told her she needed to stay on a regular schedule and recommended physical therapy.  She wanted to try water aerobics instead of formal physical therapy.   She states that she is better today but overall feels worse over the last month or so.  She is stiff and having trouble moving.  Mornings are definitely the biggest problem.  Can have issue in the afternoon but that timing/frequency isn't consistent like the AM.  Uses walker when goes out.  Saw Dr. Rita Ohara last week and told that back issues were not cause of her problem, although when she bends over or tries to get up, she still has pain.    08/16/16 update:   The patient follows up today regarding Parkinson's disease.  We changed the way she dosed her carbidopa/levodopa 25/100 to, 3 tablets between 5-6am/2 at 9am/1 at 1pm/2 at 5pm.  Reports that this was helpful.  She is also on pramipexole, 1 mg 3 times per day.   Reports that she is much better.  She thinks that all of her deterioration was just from taking a nighttime CR of levodopa.  She is walking somewhat.   She denies sleep attacks.  Denies compulsive behaviors.  She denies hallucinations.  She has had no falls since our last visit.  She fell a few times.  With one time her feet froze and she fell forward.  With one, she was in the grass and went to stand up and fell back.  With one, she was in the kitchen and fell.  Can't get her walker through doors and asks me for new RX.  She had lab work after our last visit including a urinalysis and chemistry which were normal.  She had a TSH by her primary care physician about a week after our visit and that was normal.  States that for the last 2 weeks she has had urinary frequency.  Went to UC and started on doxycyline and then had call after the cx and told she didn't need it.  Has had urinary incontinence.  Reports that her back is very slowly getting better.    01/12/17 update:  Patient follows up today.  She is on  carbidopa/levodopa 25/100, 3 tablets between 5 and 6 AM/2 tablets at 9 AM/1 tablet at 1 PM/2 tablets at 5 PM and then will take another at bedtime.  She is on pramipexole 1 mg 3 times per day.  Last visit, I talked to her about Apokyn injections for freezing, and she did not think that she was interested, but called back after her last visit and stated that she was not doing well and was interested in the [place].  We sent her the release of information form to get her started on it but she didn't send it back.  She states that she just decided she didn't want to do that.  She has had no hallucinations.  No lightheadedness or near syncope.  She did see [facility] on 08/17/2016.  I reviewed those notes, which I received just yesterday (called and requested them).  She was started on Myrbetriq.  She states that she didn't need it and the bladder got better.  She did put a "potty chair" next to her bed so she didn't have to walk so far in the middle of the night.  She is having more problems with her back and she an MRI and CT of the back with Dr. Rita Ohara.  She has fallen several times.  She thinks her back is the reason for the falls.  Not doing therapy and Dr. Rita Ohara told her to wait on that for the scans.  05/16/17 update:  Pt f/u today.  She is on carbidopa/levodopa 25/100, 3 tablets at 5:00 am, 2 tablets at 9:00 am, 1 tablet at 1:00 pm, 2 tablets at 5:00 pm and one at night.  She remains on pramipexole 1mg  tid.    Not lightheaded or dizzy.  Has had several falls (about 5) since our last visit.  Never had a fall with a walker/cane.    09/19/17 update: Patient seen in follow-up today regarding her Parkinson's disease.  She remains on carbidopa/levodopa 25/100, 3 tablets at 5 AM, 2 tablets at 9 AM, 1 tablet at 1 PM, 2 tablets at 5 PM and 1 tablet at bedtime.  She is also on pramipexole 1 mg 3 times per day.  She has had no lightheadedness or near syncope.  No compulsive behaviors.  She has had 7 falls in  last few months - her hands were not on the walker when it happened.  She has had some episodes of freezing, which have been worse when she is under stress.  No sleep attacks.  Her biggest issue has been low back pain.  She has been following with [facility].  I do not have any notes in that regard.  She reports that she is having back surgery next week.  12/25/17 update:  Pt seen in f/u for  PD.  Requested work in today.  On carbidopa/levodopa 25/100, 3 tablets at 5 AM, 2 tablets at 9 AM, 1 tablet at 1 PM, 2 tablets at 5 PM and 1 tablet at bedtime.  She is also on pramipexole, 1 mg 3 times per day.  She had back surgery on November 1 with Dr. Rita Ohara.  The records that were made available to me were reviewed.  Interestingly, neurology was consulted post surgery for dyskinesia and it stated that she denied history of dyskinesia (patient has had significant dyskinesia).  She had significant difficulty with postoperative pain.  CT of the lumbar spine on October 26, 2017 was concerning for loosening of screws.  She states that for the first time last week she had a good week, with less pain.  She really doesn't want the hardware removed even though it was advised she do so.  Fortunately, she states that her PD sx's have been really good.  Her f/u appt with dr. Rita Ohara is 01/18/18.  She is on gabapentin - 600mg  - 1/2 po bid.    She fell a few weeks ago.  Her feet froze and she reached out and hurt her right shoulder.  She is having L elbow pain as well.    03/22/18 update: Patient is seen today in follow-up for Parkinson's disease.  She was seen as a work in today.  She is accompanied by her husband who supplements the history.  She is on carbidopa/levodopa 25/100, 3 tablets at 5 AM/2 tablets at 9 AM/1 tablet at 1 PM/2 tablets at 5 PM and 1 tablet at bedtime.  She is also on pramipexole 1 mg 3 times per day. 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   She called last visit and asked about  Inbrija.  Once it was partially available, we had her sign the paperwork for that.  She ultimately stated that she did research on the medication and thought it would be too expensive for her, although she never actually got the cost.  She called earlier this week stating that she was interested in [place] and was going to call her insurance about cost.  She has tried to call the insurance but didn't get an answer.  She gets a "spell" daily where she can hardly move.  If she takes an extra carbidopa/levodopa she notes that it will help.  Falling a lot.  Fell 3 times this morning (no walker).  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 Macrobid [Nitrofurantoin Macrocrystal] Swelling and Rash  . Baclofen Other (See Comments)    Kept her awake, affected memory  . Cephalosporins Other (See Comments)    Cannot sleep  . Codeine Nausea Only    dizziness  . Darvocet [Propoxyphene N-Acetaminophen] Nausea And Vomiting  . Diclofenac Rash    The gel caused a rash  . Levaquin [Levofloxacin] Hives  . Penicillins Rash  . Selegiline Hcl Rash  . Tramadol Nausea And Vomiting  . Triamcinolone Itching    CURRENT MEDICATIONS:  Outpatient Encounter Medications as of 03/22/2018  Medication Sig  . acetaminophen (TYLENOL) 500 MG tablet Take 1,000 mg by mouth 2 (two) times daily as needed for moderate pain or headache.  . carbidopa-levodopa (SINEMET IR)  25-100 MG tablet TAKE 3 TABLETS  5AM, TAKE 2 TABLETS AT9AM, TAKE 1 TABLET AT 1PM, TAKE 2 TABLETS AT 5PM, AND TAKE 1 TABLET AT NIGHT, AND TAKE EXTRA TAB PRN  . Cholecalciferol (VITAMIN D3) 5000 units CAPS Take 5,000 Units by mouth 2 (two) times daily.  [name] dexamethasone (DECADRON) 0.5 MG tablet Take 1 tab 3 x day - 3  days, then 2 x day - 3 days, then 1 tab daily  . gabapentin (NEURONTIN) 600 MG tablet Take 1/2 to 1 tablet 3 to 4 x / day for Chronic & Acute Pain (Patient taking differently: Take 300 mg by mouth 2 (two) times daily. )  . hydrocortisone cream 1 % Apply 1 application topically daily as needed for itching.  [name] ibuprofen (ADVIL,MOTRIN) 200 MG tablet Take 200 mg by mouth 3 (three) times daily.  . Levodopa (INBRIJA) 42 MG CAPS Place 1 capsule into inhaler and inhale as needed.  . polyethylene glycol (MIRALAX / GLYCOLAX) packet Take 17 g by mouth daily.  . pramipexole (MIRAPEX) 1 MG tablet TAKE 1 TABLET BY MOUTH 3 TIMES DAILY  . ranitidine (ZANTAC) 150 MG tablet Take 150 mg by mouth daily as needed for heartburn.    No facility-administered encounter medications on file as of 03/22/2018.     PAST MEDICAL HISTORY:   Past Medical History:  Diagnosis Date  . Anemia    as young girl and teenager  . Arthritis   . Chest pain, unspecified   . Constipation   . Costochondritis   . GERD (gastroesophageal reflux disease)   . Headache    ocular migraines  . History of hiatal hernia   . Hyperlipidemia 10-07-12  . Never smoked tobacco   . Parkinson's disease ([place]) 10-07-12  . Parkinson's disease ([place])   . Prediabetes   . 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 Spinal stenosis     PAST SURGICAL HISTORY:   Past Surgical History:  Procedure Laterality Date  . ABDOMINAL HYSTERECTOMY    . APPENDECTOMY    . BACK SURGERY  12/20/2015  . BACK SURGERY  09/27/2017  . BUNIONECTOMY Bilateral 11-11-3  . CARDIAC CATHETERIZATION Bilateral 2013  . COLONOSCOPY    . HERNIA REPAIR    . LEFT HEART CATHETERIZATION WITH CORONARY ANGIOGRAM N/A 07/03/2012   Procedure: LEFT HEART CATHETERIZATION WITH CORONARY ANGIOGRAM;  Surgeon: Troy Sine, MD;  Location: [facility] CATH LAB;  Service: Cardiovascular;  Laterality: N/A;  . REPAIR OF CEREBROSPINAL FLUID LEAK N/A 12/24/2015   Procedure: LUMBAR WOUND EXPLORATION, REPAIR OF  CEREBROSPINAL FLUID LEAK, PLACEMENT OF LUMBAR DRAIN;  Surgeon: Kevan Ny Ditty, MD;  Location: [facility] NEURO ORS;  Service: Neurosurgery;  Laterality: N/A;  . ROTATOR CUFF REPAIR Left 10-07-12    SOCIAL HISTORY:   Social History   Socioeconomic History  . Marital status: Married    Spouse name: Not on file  . Number of children: Not on file  . Years of education: Not on file  . Highest education level: Not on file  Occupational History  . Occupation: retired    Comment: Chiropractor  Social Needs  . Financial resource strain: Not on file  . Food insecurity:    Worry: Not on file    Inability: Not on file  . Transportation needs:    Medical: Not on file    Non-medical: Not on file  Tobacco Use  . Smoking status: Never Smoker  . Smokeless tobacco: Never Used  Substance and Sexual Activity  .  Alcohol use: No    Alcohol/week: 0.0 oz  . Drug use: No  . Sexual activity: Not on file  Lifestyle  . Physical activity:    Days per week: Not on file    Minutes per session: Not on file  . Stress: Not on file  Relationships  . Social connections:    Talks on phone: Not on file    Gets together: Not on file    Attends religious service: Not on file    Active member of club or organization: Not on file    Attends meetings of clubs or organizations: Not on file    Relationship status: Not on file  . Intimate partner violence:    Fear of current or ex partner: Not on file    Emotionally abused: Not on file    Physically abused: Not on file    Forced sexual activity: Not on file  Other Topics Concern  . Not on file  Social History Narrative  . Not on file    FAMILY HISTORY:   Family Status  Relation Name Status  . Mother  Deceased       esophageal cancer, heart attack  . Father  Deceased       suicide  . Brother  Deceased       suicide  . Brother  Deceased       HTN, hypercholesterolemia  . Brother  Alive       mental retardation    ROS:      A complete 10  system review of systems was obtained and was unremarkable apart from what is mentioned above.  PHYSICAL EXAMINATION:    VITALS:   Vitals:   03/22/18 0838  BP: 122/70  Pulse: 70  Weight: 141 lb (64 kg)  Height: 4\' 11"  (1.499 m)   No data found.    GEN:  The patient appears stated age and is in NAD. HEENT:  Normocephalic, atraumatic.  The mucous membranes are moist. The superficial temporal arteries are without ropiness or tenderness. CV:  RRR Lungs:  CTAB Neck/HEME:  There are no carotid bruits bilaterally.  Neurological examination:  Orientation: The patient is alert and oriented x3.  Cranial nerves: There is good facial symmetry.  No significant facial hypomimia.   Extraocular muscles are intact.  There are no square wave jerks.  The visual fields are full to confrontational testing. The speech is fluent and clear. Soft palate rises symmetrically and there is no tongue deviation. Hearing is intact to conversational tone. Sensation: Sensation is intact to light touch throughout. Motor: Strength is 5/5 in the bilateral upper and lower extremities.   Shoulder shrug is equal and symmetric.  There is no pronator drift.  Movement examination: Tone: There is normal tone in the UE/LE Abnormal movements: there is only mild dyskinesia today Coordination:  There is decremation with finger taps on the L.  Gait and Station: The patient has no difficulty getting out of the chair.  Stride length is good.  She is ambulating with her walker.  ASSESSMENT/PLAN:  1.  Idiopathic PD.    -continue Carbidopa Levodopa as follows: 3 tablets at 5:00 am, 2 tablets at 9:00 am, 1 tablet at 1:00 pm, 2 tablets at 5:00 pm and one at night.  -take extra carbidopa/levodopa 25/100 prn   -Talked to her about risks of mirapex 1 mg tid as she ages but didn't change that today. no longer driving due to EDS.  Talked to her about weaning  that in the near future.    -I talked to her extensively today, as I have in  the past, about using her walker at all times.  She does not fall when she has a walker, but has fallen multiple times since our last visit without the walker.  I am going to send her to physical therapy.  -Discussed Inbrija in detail.  Shown how to use the medication.  Given samples.  Called her insurance company (25 minutes) and figured out why she was not getting the medication. 2.  Urinary incontinence and frequency  -doing better in this regard 3.  Back pain  -She had surgery September 27, 2017 with significant postoperative pain.  She has had loosening of screws and likely radicular pain due to this.  Her is still having some of this pain. 4.  RBD.  -  She did not want to take the clonazepam, primarily because of its addictive properties.  She did try it for a short period of time.  She reports doing well in this regard.   5.  I will plan on seeing her back in June, at which point he if she is doing well with Inbrija, we will discuss potentially changing Mirapex.  Much greater than 50% of this visit was spent in counseling and coordinating care.  Total face to face time:  30 min, not including the time on phone with insurance co

## 2018-03-20 ENCOUNTER — Ambulatory Visit: Payer: Medicare Other | Admitting: Neurology

## 2018-03-22 ENCOUNTER — Ambulatory Visit (INDEPENDENT_AMBULATORY_CARE_PROVIDER_SITE_OTHER): Payer: Medicare Other | Admitting: Neurology

## 2018-03-22 ENCOUNTER — Encounter: Payer: Self-pay | Admitting: Neurology

## 2018-03-22 ENCOUNTER — Telehealth: Payer: Self-pay | Admitting: Neurology

## 2018-03-22 VITALS — BP 122/70 | HR 70 | Ht 59.0 in | Wt 141.0 lb

## 2018-03-22 DIAGNOSIS — G249 Dystonia, unspecified: Secondary | ICD-10-CM

## 2018-03-22 DIAGNOSIS — G4752 REM sleep behavior disorder: Secondary | ICD-10-CM

## 2018-03-22 DIAGNOSIS — G20 Parkinson's disease: Secondary | ICD-10-CM | POA: Diagnosis not present

## 2018-03-22 DIAGNOSIS — G4710 Hypersomnia, unspecified: Secondary | ICD-10-CM

## 2018-03-22 MED ORDER — LEVODOPA 42 MG IN CAPS: 1.0000 | ORAL_CAPSULE | RESPIRATORY_TRACT | 0 refills | Status: DC | PRN

## 2018-03-22 NOTE — Telephone Encounter (Signed)
No physical therapy available in [place].  Referral faxed to [facility] for PT at [phone] with confirmation received.

## 2018-03-22 NOTE — Patient Instructions (Addendum)
  Powering Together for [employer] & Movement Disorders  The [place] [place] [facility] Movement Disorders team know that living well with a movement disorder extends far beyond our clinic walls. We are together with you. Our team is passionate about providing resources to you and your loved ones who are living with Parkinson's disease and movement disorders. Participate in these programs and join our community. These resources are free or low cost!   [facility] is adding:   Innovative educational programs for patients and caregivers.   Support groups for patients and caregivers living with Parkinson's disease.   Parkinson's specific exercise programs.   Custom tailored therapeutic programs that will benefit patient's living with Parkinson's disease.   We are in this together. You can help and contribute to grow these programs and resources in our community. 100% of the funds donated to the [facility] stays right here in our community to support patients and their caregivers.  To make a tax deductible contribution:  -ask for a Power Together for Parkinson's envelope in the office today.  - call the Office of Institutional Advancement at [phone].     Registration is OPEN!    Third Annual Parkinson's Education Symposium   To register: [email or web address]      Search:  [employer] person attending individually Questions: [place], [place] or [email or web address]     Inbrija is sent to [facility]: they can be contacted at [phone] to get shipment.

## 2018-03-26 DIAGNOSIS — M4726 Other spondylosis with radiculopathy, lumbar region: Secondary | ICD-10-CM | POA: Diagnosis not present

## 2018-03-26 DIAGNOSIS — M5136 Other intervertebral disc degeneration, lumbar region: Secondary | ICD-10-CM | POA: Diagnosis not present

## 2018-03-26 DIAGNOSIS — Z981 Arthrodesis status: Secondary | ICD-10-CM | POA: Diagnosis not present

## 2018-03-26 DIAGNOSIS — R030 Elevated blood-pressure reading, without diagnosis of hypertension: Secondary | ICD-10-CM | POA: Diagnosis not present

## 2018-03-26 DIAGNOSIS — Z6827 Body mass index (BMI) 27.0-27.9, adult: Secondary | ICD-10-CM | POA: Diagnosis not present

## 2018-03-28 ENCOUNTER — Telehealth: Payer: Self-pay | Admitting: Neurology

## 2018-03-28 DIAGNOSIS — G20 Parkinson's disease: Secondary | ICD-10-CM

## 2018-03-28 NOTE — Telephone Encounter (Signed)
Pt called and wants a referral sent to [facility] for home physical therapy Fax# [phone]

## 2018-03-29 ENCOUNTER — Telehealth: Payer: Self-pay | Admitting: Neurology

## 2018-03-29 NOTE — Telephone Encounter (Signed)
Referral faxed to [facility] for physical therapy to [phone] with confirmation received to [phone].

## 2018-03-29 NOTE — Telephone Encounter (Signed)
Called back and gave okay for them to see patient next week.

## 2018-03-29 NOTE — Telephone Encounter (Signed)
Yes.  I don't think that she drives

## 2018-03-29 NOTE — Telephone Encounter (Signed)
We had sent to outpatient PT. Okay to send home health?

## 2018-03-29 NOTE — Telephone Encounter (Signed)
Kendra with [facility] called regarding a referral for pt for physical therapy and would like a call back regarding this CB# [phone]

## 2018-04-02 DIAGNOSIS — Z9181 History of falling: Secondary | ICD-10-CM | POA: Diagnosis not present

## 2018-04-02 DIAGNOSIS — G20 Parkinson's disease: Secondary | ICD-10-CM | POA: Diagnosis not present

## 2018-04-02 DIAGNOSIS — R296 Repeated falls: Secondary | ICD-10-CM | POA: Diagnosis not present

## 2018-04-02 DIAGNOSIS — M549 Dorsalgia, unspecified: Secondary | ICD-10-CM | POA: Diagnosis not present

## 2018-04-11 ENCOUNTER — Telehealth: Payer: Self-pay | Admitting: Neurology

## 2018-04-11 NOTE — Telephone Encounter (Signed)
Received vm and called Kim with [facility] back to give verbal orders for PT 1x week for 1 week and 2x week for 3 weeks. [phone]. To call me back with any questions.

## 2018-04-12 ENCOUNTER — Telehealth: Payer: Self-pay | Admitting: Neurology

## 2018-04-12 DIAGNOSIS — Z9181 History of falling: Secondary | ICD-10-CM | POA: Diagnosis not present

## 2018-04-12 DIAGNOSIS — G20 Parkinson's disease: Secondary | ICD-10-CM | POA: Diagnosis not present

## 2018-04-12 DIAGNOSIS — M549 Dorsalgia, unspecified: Secondary | ICD-10-CM | POA: Diagnosis not present

## 2018-04-12 DIAGNOSIS — R296 Repeated falls: Secondary | ICD-10-CM | POA: Diagnosis not present

## 2018-04-12 NOTE — Telephone Encounter (Signed)
Spoke with Elzie Rings with [facility] [phone]) who wanted verbal orders for patient to have social work visit. Wanted help with resources in the area. Verbal order given.   Also wanted to report 4 falls yesterday and one this morning. Patient did not have her walker with her during any of these falls. She did hit her head yesterday and has had some dizziness. She was strongly urged multiple times to go to the ER for evaluation of her head. Dr. Carles Collet Juluis Rainier.

## 2018-04-15 DIAGNOSIS — G20 Parkinson's disease: Secondary | ICD-10-CM | POA: Diagnosis not present

## 2018-04-15 DIAGNOSIS — Z9181 History of falling: Secondary | ICD-10-CM | POA: Diagnosis not present

## 2018-04-15 DIAGNOSIS — J209 Acute bronchitis, unspecified: Secondary | ICD-10-CM | POA: Diagnosis not present

## 2018-04-15 DIAGNOSIS — M549 Dorsalgia, unspecified: Secondary | ICD-10-CM | POA: Diagnosis not present

## 2018-04-15 DIAGNOSIS — R296 Repeated falls: Secondary | ICD-10-CM | POA: Diagnosis not present

## 2018-04-15 NOTE — Telephone Encounter (Signed)
Patient said she did go to urgent care this morning and they didn't recommend imaging since symptoms gone/it had been so long. She did get diagnosed with bronchitis.   Again encouraged walker usage.

## 2018-04-15 NOTE — Telephone Encounter (Signed)
[facility] for social work.  Find out if she is doing better today?  Encouraged her MANY times to use the walker all the time

## 2018-04-19 DIAGNOSIS — Z9181 History of falling: Secondary | ICD-10-CM | POA: Diagnosis not present

## 2018-04-19 DIAGNOSIS — R296 Repeated falls: Secondary | ICD-10-CM | POA: Diagnosis not present

## 2018-04-19 DIAGNOSIS — G20 Parkinson's disease: Secondary | ICD-10-CM | POA: Diagnosis not present

## 2018-04-19 DIAGNOSIS — M549 Dorsalgia, unspecified: Secondary | ICD-10-CM | POA: Diagnosis not present

## 2018-04-23 DIAGNOSIS — G20 Parkinson's disease: Secondary | ICD-10-CM | POA: Diagnosis not present

## 2018-04-23 DIAGNOSIS — Z9181 History of falling: Secondary | ICD-10-CM | POA: Diagnosis not present

## 2018-04-23 DIAGNOSIS — M549 Dorsalgia, unspecified: Secondary | ICD-10-CM | POA: Diagnosis not present

## 2018-04-23 DIAGNOSIS — R296 Repeated falls: Secondary | ICD-10-CM | POA: Diagnosis not present

## 2018-04-24 ENCOUNTER — Telehealth: Payer: Self-pay | Admitting: Neurology

## 2018-04-24 DIAGNOSIS — M549 Dorsalgia, unspecified: Secondary | ICD-10-CM | POA: Diagnosis not present

## 2018-04-24 DIAGNOSIS — Z9181 History of falling: Secondary | ICD-10-CM | POA: Diagnosis not present

## 2018-04-24 DIAGNOSIS — R296 Repeated falls: Secondary | ICD-10-CM | POA: Diagnosis not present

## 2018-04-24 DIAGNOSIS — G20 Parkinson's disease: Secondary | ICD-10-CM | POA: Diagnosis not present

## 2018-04-24 NOTE — Telephone Encounter (Signed)
Alexandra Lindsey, PT with home health, called today to report another fall. No injuries. Patient did not hit her head. No LOC. She wasn't using her walker. She also is not taking Inbrija. She tried it once and decided she did not want to use it.  Dr. Carles ColletJuluis Rainier.

## 2018-04-26 ENCOUNTER — Ambulatory Visit (INDEPENDENT_AMBULATORY_CARE_PROVIDER_SITE_OTHER): Payer: Medicare Other | Admitting: Internal Medicine

## 2018-04-26 ENCOUNTER — Other Ambulatory Visit: Payer: Self-pay | Admitting: Neurology

## 2018-04-26 VITALS — BP 96/44 | HR 72 | Temp 97.9°F | Resp 16 | Ht 59.0 in | Wt 138.2 lb

## 2018-04-26 DIAGNOSIS — M545 Low back pain: Secondary | ICD-10-CM | POA: Diagnosis not present

## 2018-04-26 DIAGNOSIS — G8929 Other chronic pain: Secondary | ICD-10-CM

## 2018-04-26 DIAGNOSIS — F329 Major depressive disorder, single episode, unspecified: Secondary | ICD-10-CM

## 2018-04-26 DIAGNOSIS — M549 Dorsalgia, unspecified: Secondary | ICD-10-CM | POA: Diagnosis not present

## 2018-04-26 DIAGNOSIS — F32A Depression, unspecified: Secondary | ICD-10-CM

## 2018-04-26 DIAGNOSIS — R296 Repeated falls: Secondary | ICD-10-CM | POA: Diagnosis not present

## 2018-04-26 DIAGNOSIS — G20 Parkinson's disease: Secondary | ICD-10-CM | POA: Diagnosis not present

## 2018-04-26 DIAGNOSIS — Z9181 History of falling: Secondary | ICD-10-CM | POA: Diagnosis not present

## 2018-04-26 MED ORDER — DEXAMETHASONE 0.5 MG PO TABS: ORAL_TABLET | ORAL | 0 refills | Status: DC

## 2018-04-26 MED ORDER — SERTRALINE HCL 25 MG PO TABS: 25.0000 mg | ORAL_TABLET | Freq: Every day | ORAL | 1 refills | Status: DC

## 2018-04-26 NOTE — Progress Notes (Signed)
Subjective:    Patient ID: Alexandra Lindsey, female    DOB: 07/11/1941, 77 y.o.   MRN: [id number]  HPI  Patient is a very nice 77 yo MWF with HTN, HLD, PreDM, GERD, & Parkinson's Dz followed also by Dr [name]. Patient is s/p several back surgeries and has been receiving out-pt LPTx . She has ongoing chronic pain from her spinal surgeries and also she is depressed due to her limitations from her Parkinson's Dz.  Patient's husband  reports that she is depressed & occasional tearful. In the past she has declined treatment of Depression. He reports her back pain is limiting. Today she also endorses a depressed mood consequent of her physical issues.   Medication Sig  . acetaminophen  500 MG tablet Take 1,000 mg by mouth 2 (two) times daily as needed for moderate pain or headache.  . albuterol  HFA  inhaler Inhale 1-2 puffs every 6 (six) hours as needed   . carbidopa-levodopa  IR 25-100 MG  TAKE 3 TAB  5AM, TAKE 2 TAB AT 9AM, TAKE 1 TAB AT 1PM, TAKE 2 TAB AT 5PM & TAKE 1 TAB AT NIGHT, AND TAKE EXTRA TAB PRN  . VITAMIN D 5000 units CAPS Take 2  times daily.  [name] gabapentin600 MG tablet Take 1/2-1 tab 3-4 x / day for Chronic & Acute Pain (Patient taking differently: Take 300 mg by mouth at bedtime. )  . hydrocortisone crm 1 % Apply 1 application topically daily as needed for itching.  [name] ibuprofen 200 MG tablet Take  3 (three) times daily.  . Levodopa (INBRIJA) 42 MG CAPS Place 1 capsule into inhaler and inhale as needed.  . polyethylene glycol 17 g Take daily.  . ranitidine 150 MG tablet Take  daily as needed for heartburn.   . pramipexole  1 MG tablet TAKE 1 TAB 3 TIMES DAILY   Allergies  Allergen Reactions  . Macrobid [Nitrofurantoin Macrocrystal] Swelling and Rash  . Baclofen Other (See Comments)    Kept her awake, affected memory  . Cephalosporins Other (See Comments)    Cannot sleep  . Codeine Nausea Only    dizziness  . Darvocet [Propoxyphene N-Acetaminophen] Nausea And Vomiting  . Diclofenac  Rash    The gel caused a rash  . Levaquin [Levofloxacin] Hives  . Penicillins Rash  . Selegiline Hcl Rash  . Tramadol Nausea And Vomiting  . Triamcinolone Itching   Allergies  Allergen Reactions  . Macrobid [Nitrofurantoin Macrocrystal] Swelling and Rash  . Baclofen Other (See Comments)    Kept her awake, affected memory  . Cephalosporins Other (See Comments)    Cannot sleep  . Codeine Nausea Only    dizziness  . Darvocet [Propoxyphene N-Acetaminophen] Nausea And Vomiting  . Diclofenac Rash    The gel caused a rash  . Levaquin [Levofloxacin] Hives  . Penicillins Rash  . Selegiline Hcl Rash  . Tramadol Nausea And Vomiting  . Triamcinolone Itching   Past Medical History:  Diagnosis Date  . Anemia    as young girl and teenager  . Arthritis   . Chest pain, unspecified   . Constipation   . Costochondritis   . GERD (gastroesophageal reflux disease)   . Headache    ocular migraines  . History of hiatal hernia   . Hyperlipidemia 10-07-12  . Never smoked tobacco   . Parkinson's disease ([place]) 10-07-12  . Parkinson's disease ([place])   . Prediabetes   . 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  Spinal stenosis    Past Surgical History:  Procedure Laterality Date  . ABDOMINAL HYSTERECTOMY    . APPENDECTOMY    . BACK SURGERY  12/20/2015  . BACK SURGERY  09/27/2017  . BUNIONECTOMY Bilateral 11-11-3  . CARDIAC CATHETERIZATION Bilateral 2013  . COLONOSCOPY    . HERNIA REPAIR    . LEFT HEART CATHETERIZATION WITH CORONARY ANGIOGRAM N/A 07/03/2012   Procedure: LEFT HEART CATHETERIZATION WITH CORONARY ANGIOGRAM;  Surgeon: Troy Sine, MD;  Location[facility] CATH LAB;  Service: Cardiovascular;  Laterality: N/A;  . REPAIR OF CEREBROSPINAL FLUID LEAK N/A 12/24/2015   Procedure: LUMBAR WOUND EXPLORATION, REPAIR OF CEREBROSPINAL FLUID LEAK, PLACEMENT OF LUMBAR DRAIN;  Surgeon: Kevan Ny Ditty, MD;  Location: [facility] NEURO ORS;  Service: Neurosurgery;  Laterality: N/A;  . ROTATOR CUFF  REPAIR Left 10-07-12   Review of Systems  10 point systems review negative except as above.    Objective:   Physical Exam  BP  96/44 - reck'd at 106/68   P 72   T 97.9 F   Resp 16   Ht 4\' 11"    Wt 138 lb 3.2 oz   BMI 27.91   HEENT - WNL. Neck - supple.  Chest - Clear equal BS. Cor - Nl HS. RRR w/o sig m. PP 1(+). No edema. MS- FROM w/o deformities. (+) cog wheeling.  Shuffling gait supported w/ a walker.  Neuro -  Nl w/o focal abnormalities. No tremor.Flat depressed affect. No SI.     Assessment & Plan:   1. Chronic bilateral low back pain without sciatica  - dexamethasone (DECADRON) 0.5 MG tablet; Take 1 tab 3 x day - 3 days, then 2 x day - 3 days, then 1 tab daily  Dispense: 20 tablet; Refill: 0  2. Depression  - sertraline (ZOLOFT) 25 MG tablet; Take 1 tablet (25 mg total) by mouth daily.  Dispense: 30 tablet; Refill: 1  - Discussed meds & SE's.

## 2018-04-27 ENCOUNTER — Encounter: Payer: Self-pay | Admitting: Internal Medicine

## 2018-04-29 DIAGNOSIS — R296 Repeated falls: Secondary | ICD-10-CM | POA: Diagnosis not present

## 2018-04-29 DIAGNOSIS — G20 Parkinson's disease: Secondary | ICD-10-CM | POA: Diagnosis not present

## 2018-04-29 DIAGNOSIS — Z9181 History of falling: Secondary | ICD-10-CM | POA: Diagnosis not present

## 2018-04-29 DIAGNOSIS — M549 Dorsalgia, unspecified: Secondary | ICD-10-CM | POA: Diagnosis not present

## 2018-04-30 ENCOUNTER — Ambulatory Visit: Payer: Medicare Other | Admitting: Neurology

## 2018-04-30 ENCOUNTER — Encounter

## 2018-05-03 ENCOUNTER — Telehealth: Payer: Self-pay | Admitting: Neurology

## 2018-05-03 DIAGNOSIS — M549 Dorsalgia, unspecified: Secondary | ICD-10-CM | POA: Diagnosis not present

## 2018-05-03 DIAGNOSIS — G20 Parkinson's disease: Secondary | ICD-10-CM | POA: Diagnosis not present

## 2018-05-03 DIAGNOSIS — Z9181 History of falling: Secondary | ICD-10-CM | POA: Diagnosis not present

## 2018-05-03 DIAGNOSIS — R296 Repeated falls: Secondary | ICD-10-CM | POA: Diagnosis not present

## 2018-05-03 NOTE — Telephone Encounter (Signed)
Alexandra Lindsey with [facility] home health called to report a fall. No injuries. Fell backwards while using walker. Husband was present and helped her up.   Verbal order given to extend PT to twice a week for 4 weeks.

## 2018-05-06 ENCOUNTER — Other Ambulatory Visit: Payer: Self-pay | Admitting: Neurology

## 2018-05-06 DIAGNOSIS — G20 Parkinson's disease: Secondary | ICD-10-CM | POA: Diagnosis not present

## 2018-05-06 DIAGNOSIS — Z9181 History of falling: Secondary | ICD-10-CM | POA: Diagnosis not present

## 2018-05-06 DIAGNOSIS — R296 Repeated falls: Secondary | ICD-10-CM | POA: Diagnosis not present

## 2018-05-06 DIAGNOSIS — M549 Dorsalgia, unspecified: Secondary | ICD-10-CM | POA: Diagnosis not present

## 2018-05-07 ENCOUNTER — Telehealth: Payer: Self-pay | Admitting: *Deleted

## 2018-05-07 NOTE — Telephone Encounter (Signed)
Patient called and states she has been taking Ibuprofen and Tylenol for her back pain. She asked if she can change to Aleve.  Per Dr Melford Aase, she can take Aleve 220 mg 3 tablets daily and also, Tylenol 500 mg 2 tablet 3 times a day, if needed.

## 2018-05-10 DIAGNOSIS — M549 Dorsalgia, unspecified: Secondary | ICD-10-CM | POA: Diagnosis not present

## 2018-05-10 DIAGNOSIS — R296 Repeated falls: Secondary | ICD-10-CM | POA: Diagnosis not present

## 2018-05-10 DIAGNOSIS — G20 Parkinson's disease: Secondary | ICD-10-CM | POA: Diagnosis not present

## 2018-05-10 DIAGNOSIS — Z9181 History of falling: Secondary | ICD-10-CM | POA: Diagnosis not present

## 2018-05-13 DIAGNOSIS — R296 Repeated falls: Secondary | ICD-10-CM | POA: Diagnosis not present

## 2018-05-13 DIAGNOSIS — Z9181 History of falling: Secondary | ICD-10-CM | POA: Diagnosis not present

## 2018-05-13 DIAGNOSIS — M549 Dorsalgia, unspecified: Secondary | ICD-10-CM | POA: Diagnosis not present

## 2018-05-13 DIAGNOSIS — G20 Parkinson's disease: Secondary | ICD-10-CM | POA: Diagnosis not present

## 2018-05-16 ENCOUNTER — Telehealth: Payer: Self-pay | Admitting: Neurology

## 2018-05-16 DIAGNOSIS — M549 Dorsalgia, unspecified: Secondary | ICD-10-CM | POA: Diagnosis not present

## 2018-05-16 DIAGNOSIS — G20 Parkinson's disease: Secondary | ICD-10-CM | POA: Diagnosis not present

## 2018-05-16 DIAGNOSIS — R296 Repeated falls: Secondary | ICD-10-CM | POA: Diagnosis not present

## 2018-05-16 DIAGNOSIS — Z9181 History of falling: Secondary | ICD-10-CM | POA: Diagnosis not present

## 2018-05-16 NOTE — Telephone Encounter (Signed)
Are falls with the walker?

## 2018-05-16 NOTE — Telephone Encounter (Signed)
Left message on machine for Gretchen to call back.

## 2018-05-16 NOTE — Telephone Encounter (Signed)
Dr. [name] - FYI. 

## 2018-05-17 ENCOUNTER — Ambulatory Visit (HOSPITAL_COMMUNITY)
Admission: RE | Admit: 2018-05-17 | Discharge: 2018-05-17 | Disposition: A | Discharge status: Home / Self Care | Payer: Medicare Other | Source: Ambulatory Visit | Attending: Internal Medicine | Admitting: Internal Medicine

## 2018-05-17 ENCOUNTER — Telehealth: Payer: Self-pay | Admitting: *Deleted

## 2018-05-17 ENCOUNTER — Ambulatory Visit (INDEPENDENT_AMBULATORY_CARE_PROVIDER_SITE_OTHER): Payer: Medicare Other | Admitting: Internal Medicine

## 2018-05-17 VITALS — BP 92/58 | HR 88 | Temp 97.5°F | Resp 16 | Ht 59.0 in | Wt 138.9 lb

## 2018-05-17 DIAGNOSIS — S7002XA Contusion of left hip, initial encounter: Secondary | ICD-10-CM | POA: Diagnosis not present

## 2018-05-17 DIAGNOSIS — M545 Low back pain: Secondary | ICD-10-CM | POA: Diagnosis not present

## 2018-05-17 DIAGNOSIS — M25552 Pain in left hip: Secondary | ICD-10-CM | POA: Diagnosis not present

## 2018-05-17 DIAGNOSIS — W19XXXA Unspecified fall, initial encounter: Secondary | ICD-10-CM | POA: Diagnosis not present

## 2018-05-17 NOTE — Progress Notes (Signed)
Subjective:    Patient ID: Alexandra Lindsey, female    DOB: 1941/08/12, 77 y.o.   MRN: [id number]  HPI  This very nice 77 yo MWF with advanced Parkinson's Dz and hx/o unstable gait. The lady friend who brought her in relates pt's husband & friends - all feel that she always seems in a hurry and creates her own propensity to falling. She fell again this morning and is c/o LBP and Lt hip hain . She is ambulating w/o obvious limp.  Medication Sig  . acetaminophen (TYLENOL) 500 MG tablet Take 1,000 mg   2  times daily as needed   . albuterol  HFA inhaler 1-2 puffs  every 6  hrs as needed for wheezing  . benzonatate  100 MG capsule Take 3times daily as needed for cough.  . SINEMET IR 25-100 MG tablet TAKES 9-10 tablets thru-out the day  . VITAMIN D 5000 units CAPS Take 2  times daily.  . Gabapentin  600 MG tablet Take 300 mg  at bedtime.   . hydrocortisone cream 1 % Apply 1 application topically daily as needed for itching.  . naproxen  220 MG tablet Take  daily.  . polyethylene glycol  Take 17 g  daily.  . pramipexole (MIRAPEX) 1 MG tablet TAKE 1 TABLET  THREE TIMES DAILY  . ranitidine150 MG tablet Take 150 mg  daily as needed for heartburn.   . sertraline25 MG tablet Take 1 tablet (25 mg total) by mouth daily.  [name] ibuprofen  200 MG tablet Take  3  times daily.  . Levodopa (INBRIJA) 42 MG CAPS Place 1 capsule into inhaler and inhale as needed.   Allergies  Allergen Reactions  . Macrobid [Nitrofurantoin Macrocrystal] Swelling and Rash  . Baclofen Other (See Comments)    Kept her awake, affected memory  . Cephalosporins Other (See Comments)    Cannot sleep  . Codeine Nausea Only    dizziness  . Darvocet [Propoxyphene N-Acetaminophen] Nausea And Vomiting  . Diclofenac Rash    The gel caused a rash  . Levaquin [Levofloxacin] Hives  . Penicillins Rash  . Selegiline Hcl Rash  . Tramadol Nausea And Vomiting  . Triamcinolone Itching   Past Medical History:  Diagnosis Date  . Anemia    as  young girl and teenager  . Arthritis   . Chest pain, unspecified   . Constipation   . Costochondritis   . GERD (gastroesophageal reflux disease)   . Headache    ocular migraines  . History of hiatal hernia   . Hyperlipidemia 10-07-12  . Never smoked tobacco   . Parkinson's disease ([place]) 10-07-12  . Parkinson's disease ([place])   . Prediabetes   . 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 Spinal stenosis    Past Surgical History:  Procedure Laterality Date  . ABDOMINAL HYSTERECTOMY    . APPENDECTOMY    . BACK SURGERY  12/20/2015  . BACK SURGERY  09/27/2017  . BUNIONECTOMY Bilateral 11-11-3  . CARDIAC CATHETERIZATION Bilateral 2013  . COLONOSCOPY    . HERNIA REPAIR    . LEFT HEART CATHETERIZATION WITH CORONARY ANGIOGRAM N/A 07/03/2012   Procedure: LEFT HEART CATHETERIZATION WITH CORONARY ANGIOGRAM;  Surgeon: Troy Sine, MD;  Location: [facility] CATH LAB;  Service: Cardiovascular;  Laterality: N/A;  . REPAIR OF CEREBROSPINAL FLUID LEAK N/A 12/24/2015   Procedure: LUMBAR WOUND EXPLORATION, REPAIR OF CEREBROSPINAL FLUID LEAK, PLACEMENT OF LUMBAR DRAIN;  Surgeon: Kevan Ny Ditty, MD;  Location: [facility]  ORS;  Service: Neurosurgery;  Laterality: N/A;  . ROTATOR CUFF REPAIR Left 10-07-12   Review of Systems     10 point systems review negative except as above.    Objective:   Physical Exam  BP (!) 92/58 Comment: 92/58-sit/102/56-stand  Pulse 88   Temp (!) 97.5 F (36.4 C)   Resp 16   Ht 4\' 11"  (1.499 m)   Wt 138 lb 14.4 oz (63 kg)   BMI 28.05 kg/m   HEENT - WNL. Neck - supple.  Chest - Clear equal BS. Cor - Nl HS. RRR w/o sig MGR. PP 1(+). No edema. MS- FROM w/o deformities.  Gait shuffling. Sl cog wheeling. Tender over Lt Hip Gr Trochanteric bursa and also tender over the low Lt para lumbar muscles. Neuro -  Nl w/o focal abnormalities.    Assessment & Plan:   1. Contusion of left hip, initial encounter  - DG HIP UNILAT WITH PELVIS MIN 4 VIEWS LEFT; Future  -  encouraged patient to use her walker whenever up and to try to "think ahead" of her actions/activities

## 2018-05-17 NOTE — Telephone Encounter (Signed)
A message was left to inform the home health nurse it is OK to continue to take the Aleve and Sertraline.

## 2018-05-17 NOTE — Telephone Encounter (Signed)
Pt may need wheelchair if falls persist

## 2018-05-17 NOTE — Telephone Encounter (Signed)
Spoke with patient and she states she is using the walker. She is falling to the side when she falls.

## 2018-05-19 ENCOUNTER — Encounter: Payer: Self-pay | Admitting: Internal Medicine

## 2018-05-20 DIAGNOSIS — R296 Repeated falls: Secondary | ICD-10-CM | POA: Diagnosis not present

## 2018-05-20 DIAGNOSIS — G20 Parkinson's disease: Secondary | ICD-10-CM | POA: Diagnosis not present

## 2018-05-20 DIAGNOSIS — M549 Dorsalgia, unspecified: Secondary | ICD-10-CM | POA: Diagnosis not present

## 2018-05-20 DIAGNOSIS — Z9181 History of falling: Secondary | ICD-10-CM | POA: Diagnosis not present

## 2018-05-21 ENCOUNTER — Other Ambulatory Visit: Payer: Self-pay | Admitting: Internal Medicine

## 2018-05-21 ENCOUNTER — Telehealth: Payer: Self-pay | Admitting: *Deleted

## 2018-05-21 NOTE — Telephone Encounter (Signed)
Patient called and reported she has been having severe back pain on the last weekend. The patient contacted her surgeon, but has not heard back. Per Dr Melford Aase, if the pain is nerve related, she should increase her Gabapentin 600 mg to 1 whole tablet 3 to 4 times a day for pain.  The patient is aware.

## 2018-05-22 ENCOUNTER — Telehealth: Payer: Self-pay | Admitting: *Deleted

## 2018-05-22 DIAGNOSIS — M549 Dorsalgia, unspecified: Secondary | ICD-10-CM | POA: Diagnosis not present

## 2018-05-22 DIAGNOSIS — G20 Parkinson's disease: Secondary | ICD-10-CM | POA: Diagnosis not present

## 2018-05-22 DIAGNOSIS — R296 Repeated falls: Secondary | ICD-10-CM | POA: Diagnosis not present

## 2018-05-22 DIAGNOSIS — Z9181 History of falling: Secondary | ICD-10-CM | POA: Diagnosis not present

## 2018-05-22 NOTE — Telephone Encounter (Signed)
The patient's physical therapist called from the patient's home.  She states the patient's back pain level is 10/10. The patient took the Gabapentin 600 mg yesterday afternoon and was so sleepy, she could not take her Parkinson's medication and was not able to function. The patient has not received a call from Dr Donnella Bi office in 2 days.  The patient was encouraged to call him again and the patient  has some pain medication, Norco 5-325 1-2 tablets every 4-6 hours prn, from a surgery.  Per Dr Melford Aase, she should try the pain medication and call Dr Rita Ohara again.

## 2018-05-23 ENCOUNTER — Ambulatory Visit: Payer: Medicare Other | Admitting: Neurology

## 2018-05-26 NOTE — Progress Notes (Signed)
Subjective:    Patient ID: Alexandra Lindsey, female    DOB: 1941-08-25, 77 y.o.   MRN: [id number]  HPI    This nice 77 yo MWF with Parkinson's and unstable gait returns for 10 day f/u for LBP/Lt hip contusion. Pelvis & hip X-Rays were negative. She continue to receive out-patient home LPT consequent of her difficulty traveling outside the home from her unstable gait. She continues to have LBP and was unable to contact Dr Rita Ohara for 2-3 weeks while he was out of the country, so she's scheduled to have a consultation with Dr Sherlyn Lick at the [facility] upcoming. She can only tolerate 1/2 tab of 600 mg.   Medication Sig  . acetaminophen (TYLENOL) 500 MG tablet Take 1,000 mg by mouth 2 (two) times daily as needed for moderate pain or headache.  . albuterol (PROVENTIL HFA;VENTOLIN HFA) 108 (90 Base) MCG/ACT inhaler Inhale 1-2 puffs into the lungs every 6 (six) hours as needed for wheezing or shortness of breath.  . carbidopa-levodopa (SINEMET IR) 25-100 MG tablet TAKE 3 TABLETS AT 5AM, TAKE 2 TABS AT 9AM, TAKE 1 TAB AT 1PM, TAKE 2 TABS AT 5PM, TAKE 1 TAB AT NIGHT, AND TAKE EXTRATAB AS NEEDED  . Cholecalciferol (VITAMIN D3) 5000 units CAPS Take 5,000 Units by mouth 2 (two) times daily.  [name] gabapentin (NEURONTIN) 600 MG tablet Take 1/2 to 1 tablet 3 to 4 x / day for Chronic & Acute Pain (Patient taking differently: Take 300 mg by mouth at bedtime. )  . hydrocortisone cream 1 % Apply 1 application topically daily as needed for itching.  . naproxen sodium (ALEVE) 220 MG tablet Take 220 mg by mouth daily.  . polyethylene glycol (MIRALAX / GLYCOLAX) packet Take 17 g by mouth daily.  . pramipexole (MIRAPEX) 1 MG tablet TAKE 1 TABLET BY MOUTH THREE TIMES DAILY  . ranitidine (ZANTAC) 150 MG tablet Take 150 mg by mouth daily as needed for heartburn.   . sertraline (ZOLOFT) 25 MG tablet Take 1 tablet (25 mg total) by mouth daily.   Allergies  Allergen Reactions  . Macrobid [Nitrofurantoin  Macrocrystal] Swelling and Rash  . Baclofen Other (See Comments)    Kept her awake, affected memory  . Cephalosporins Other (See Comments)    Cannot sleep  . Codeine Nausea Only    dizziness  . Darvocet [Propoxyphene N-Acetaminophen] Nausea And Vomiting  . Diclofenac Rash    The gel caused a rash  . Levaquin [Levofloxacin] Hives  . Penicillins Rash  . Selegiline Hcl Rash  . Tramadol Nausea And Vomiting  . Triamcinolone Itching   Past Medical History:  Diagnosis Date  . Anemia    as young girl and teenager  . Arthritis   . Chest pain, unspecified   . Constipation   . Costochondritis   . GERD (gastroesophageal reflux disease)   . Headache    ocular migraines  . History of hiatal hernia   . Hyperlipidemia 10-07-12  . Never smoked tobacco   . Parkinson's disease ([place]) 10-07-12  . Parkinson's disease ([place])   . Prediabetes   . 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 Spinal stenosis    Past Surgical History:  Procedure Laterality Date  . ABDOMINAL HYSTERECTOMY    . APPENDECTOMY    . BACK SURGERY  12/20/2015  . BACK SURGERY  09/27/2017  . BUNIONECTOMY Bilateral 11-11-3  . CARDIAC CATHETERIZATION Bilateral 2013  . COLONOSCOPY    . HERNIA REPAIR    .  LEFT HEART CATHETERIZATION WITH CORONARY ANGIOGRAM N/A 07/03/2012   Procedure: LEFT HEART CATHETERIZATION WITH CORONARY ANGIOGRAM;  Surgeon: Troy Sine, MD;  Location: [facility] CATH LAB;  Service: Cardiovascular;  Laterality: N/A;  . REPAIR OF CEREBROSPINAL FLUID LEAK N/A 12/24/2015   Procedure: LUMBAR WOUND EXPLORATION, REPAIR OF CEREBROSPINAL FLUID LEAK, PLACEMENT OF LUMBAR DRAIN;  Surgeon: Kevan Ny Ditty, MD;  Location: [place] NEURO ORS;  Service: Neurosurgery;  Laterality: N/A;  . ROTATOR CUFF REPAIR Left 10-07-12   Review of Systems    10 point systems review negative except as above.    Objective:   Physical Exam  BP 114/70   Pulse 72   Temp 97.8 F (36.6 C)   Resp 16   Ht 4\' 11"  (1.499 m)   Wt 140 lb 1.6 oz  (63.5 kg)   BMI 28.30 kg/m   HEENT - WNL. Neck - supple.  Chest - Clear equal BS. Cor - Nl HS. RRR w/o sig MGR. PP 1(+). No edema. MS- FROM w/o deformities.  Gait shuffling. Sl cogwheeling. (+) tender Lt>>Rt SI jt areas. Arises from chair w/o difficulty! Nl Hip ROM.  Neuro -  Nl w/o focal abnormalities.    Assessment & Plan:   1. Chronic left SI joint pain  - gabapentin (NEURONTIN) 100 MG capsule; Take 1 capsule 2 to 3 x/ day for pain  Dispense: 90 capsule; Refill: 2  - discussed meds & SE's. ROV - prn

## 2018-05-27 ENCOUNTER — Ambulatory Visit (INDEPENDENT_AMBULATORY_CARE_PROVIDER_SITE_OTHER): Payer: Medicare Other | Admitting: Internal Medicine

## 2018-05-27 ENCOUNTER — Encounter: Payer: Self-pay | Admitting: Internal Medicine

## 2018-05-27 VITALS — BP 114/70 | HR 72 | Temp 97.8°F | Resp 16 | Ht 59.0 in | Wt 140.1 lb

## 2018-05-27 DIAGNOSIS — Z9181 History of falling: Secondary | ICD-10-CM | POA: Diagnosis not present

## 2018-05-27 DIAGNOSIS — R296 Repeated falls: Secondary | ICD-10-CM | POA: Diagnosis not present

## 2018-05-27 DIAGNOSIS — G8929 Other chronic pain: Secondary | ICD-10-CM | POA: Diagnosis not present

## 2018-05-27 DIAGNOSIS — M533 Sacrococcygeal disorders, not elsewhere classified: Secondary | ICD-10-CM | POA: Diagnosis not present

## 2018-05-27 DIAGNOSIS — M549 Dorsalgia, unspecified: Secondary | ICD-10-CM | POA: Diagnosis not present

## 2018-05-27 DIAGNOSIS — G20 Parkinson's disease: Secondary | ICD-10-CM | POA: Diagnosis not present

## 2018-05-27 MED ORDER — GABAPENTIN 100 MG PO CAPS: ORAL_CAPSULE | ORAL | 2 refills | Status: DC

## 2018-05-31 ENCOUNTER — Telehealth: Payer: Self-pay | Admitting: Neurology

## 2018-05-31 DIAGNOSIS — G20 Parkinson's disease: Secondary | ICD-10-CM | POA: Diagnosis not present

## 2018-05-31 DIAGNOSIS — Z9181 History of falling: Secondary | ICD-10-CM | POA: Diagnosis not present

## 2018-05-31 DIAGNOSIS — M549 Dorsalgia, unspecified: Secondary | ICD-10-CM | POA: Diagnosis not present

## 2018-05-31 DIAGNOSIS — R296 Repeated falls: Secondary | ICD-10-CM | POA: Diagnosis not present

## 2018-05-31 NOTE — Telephone Encounter (Signed)
Gretchen with [facility] called to report 2 falls on Wednesday, states patient did not have walker with her either time.   Also gave verbal orders to continue [facility] for two weeks.

## 2018-06-01 DIAGNOSIS — M549 Dorsalgia, unspecified: Secondary | ICD-10-CM | POA: Diagnosis not present

## 2018-06-01 DIAGNOSIS — R296 Repeated falls: Secondary | ICD-10-CM | POA: Diagnosis not present

## 2018-06-01 DIAGNOSIS — G20 Parkinson's disease: Secondary | ICD-10-CM | POA: Diagnosis not present

## 2018-06-01 DIAGNOSIS — Z9181 History of falling: Secondary | ICD-10-CM | POA: Diagnosis not present

## 2018-06-03 ENCOUNTER — Other Ambulatory Visit: Payer: Self-pay | Admitting: Internal Medicine

## 2018-06-03 ENCOUNTER — Telehealth: Payer: Self-pay | Admitting: *Deleted

## 2018-06-03 DIAGNOSIS — M549 Dorsalgia, unspecified: Secondary | ICD-10-CM | POA: Diagnosis not present

## 2018-06-03 DIAGNOSIS — F329 Major depressive disorder, single episode, unspecified: Secondary | ICD-10-CM

## 2018-06-03 DIAGNOSIS — Z9181 History of falling: Secondary | ICD-10-CM | POA: Diagnosis not present

## 2018-06-03 DIAGNOSIS — G20 Parkinson's disease: Secondary | ICD-10-CM | POA: Diagnosis not present

## 2018-06-03 DIAGNOSIS — R296 Repeated falls: Secondary | ICD-10-CM | POA: Diagnosis not present

## 2018-06-03 DIAGNOSIS — F32A Depression, unspecified: Secondary | ICD-10-CM

## 2018-06-03 MED ORDER — SERTRALINE HCL 100 MG PO TABS: ORAL_TABLET | ORAL | 2 refills | Status: DC

## 2018-06-03 NOTE — Telephone Encounter (Signed)
A message was left, to inform the home health nurse, that the patient can increase her Sertraline to 50 mg.  Dr Melford Aase sent an RX to the patient's pharmacy for Sertraline 100 mg, but the nurse was informed to advise the patient to start with 50 mg x 3 weeks and if not improved, she can start 1 tablet daily. Also, she can increase her Gabapentin 600 mg to 1/2 to 1 tablet 3 times a day and continue her Tylenol, since she is not on Hydrocodone at this time.

## 2018-06-04 ENCOUNTER — Telehealth: Payer: Self-pay | Admitting: *Deleted

## 2018-06-04 NOTE — Telephone Encounter (Signed)
Patient called and asked if Dr Melford Aase could order any imaging related to her back pain.  She has an appointment with a new doctor on 06/18/2018 and is in a great deal of pain.  Per Dr Melford Aase, he does not feel he should order imaging studies because the new doctor will know which studies he feels are needed.  The patient is aware.

## 2018-06-06 DIAGNOSIS — M549 Dorsalgia, unspecified: Secondary | ICD-10-CM | POA: Diagnosis not present

## 2018-06-06 DIAGNOSIS — Z9181 History of falling: Secondary | ICD-10-CM | POA: Diagnosis not present

## 2018-06-06 DIAGNOSIS — R296 Repeated falls: Secondary | ICD-10-CM | POA: Diagnosis not present

## 2018-06-06 DIAGNOSIS — M545 Low back pain: Secondary | ICD-10-CM | POA: Diagnosis not present

## 2018-06-06 DIAGNOSIS — T84296A Other mechanical complication of internal fixation device of vertebrae, initial encounter: Secondary | ICD-10-CM | POA: Diagnosis not present

## 2018-06-06 DIAGNOSIS — G20 Parkinson's disease: Secondary | ICD-10-CM | POA: Diagnosis not present

## 2018-06-06 NOTE — Telephone Encounter (Signed)
Left message on machine for patient to call back.

## 2018-06-06 NOTE — Telephone Encounter (Signed)
Patient called and was made aware of the change.

## 2018-06-06 NOTE — Telephone Encounter (Signed)
FYI

## 2018-06-06 NOTE — Telephone Encounter (Signed)
Gretchen with [facility] health states pt also fell 3 times yesterday 7.10.19 and stated that she is having hallucinations but does not think Dr.[name] has been made aware. Elzie Rings also states pt BP is 90/50.

## 2018-06-06 NOTE — Telephone Encounter (Signed)
Tried to call patient with no answer. No vm.

## 2018-06-06 NOTE — Telephone Encounter (Signed)
May need to try and slightly back down pramipexole.  Try pramipexole 1mg , 0.5 tablet in AM and 1 tablet in the afternoon and evening.  Is just slight decrease and may need to decrease more

## 2018-06-06 NOTE — Telephone Encounter (Signed)
Spoke with patient. She has had hallucinations for about a month. They come and go. These are auditory and visual (heard a motorcycle that wasn't there. Saw some people in the woods behind her house). She is able to decipher that these are hallucinations. No recent illnesses.   Still also having issues with back pain. Taking Tylenol, Ibuprofen, and gabapentin for pain. She got a second opinion with Dr. Sherlyn Lick with [facility] was did some Xrays and told her that she is not healed from the surgery yet and PD is probably playing a role. I did let her know this is probably more related to her constant falls. I stressed use of a walker at all times and having someone with her when she walks if she is having falls while using the walker.   Dr. Carles Collet - please advise if she should still be seen urgently.

## 2018-06-06 NOTE — Telephone Encounter (Signed)
Sounds like she needs to be checked at hospital as I don't think that hallucinations are normal for her.

## 2018-06-10 DIAGNOSIS — Z9181 History of falling: Secondary | ICD-10-CM | POA: Diagnosis not present

## 2018-06-10 DIAGNOSIS — G20 Parkinson's disease: Secondary | ICD-10-CM | POA: Diagnosis not present

## 2018-06-10 DIAGNOSIS — R296 Repeated falls: Secondary | ICD-10-CM | POA: Diagnosis not present

## 2018-06-10 DIAGNOSIS — M549 Dorsalgia, unspecified: Secondary | ICD-10-CM | POA: Diagnosis not present

## 2018-06-14 DIAGNOSIS — M549 Dorsalgia, unspecified: Secondary | ICD-10-CM | POA: Diagnosis not present

## 2018-06-14 DIAGNOSIS — Z9181 History of falling: Secondary | ICD-10-CM | POA: Diagnosis not present

## 2018-06-14 DIAGNOSIS — R296 Repeated falls: Secondary | ICD-10-CM | POA: Diagnosis not present

## 2018-06-14 DIAGNOSIS — G20 Parkinson's disease: Secondary | ICD-10-CM | POA: Diagnosis not present

## 2018-06-17 DIAGNOSIS — M549 Dorsalgia, unspecified: Secondary | ICD-10-CM | POA: Diagnosis not present

## 2018-06-17 DIAGNOSIS — R296 Repeated falls: Secondary | ICD-10-CM | POA: Diagnosis not present

## 2018-06-17 DIAGNOSIS — Z9181 History of falling: Secondary | ICD-10-CM | POA: Diagnosis not present

## 2018-06-17 DIAGNOSIS — G20 Parkinson's disease: Secondary | ICD-10-CM | POA: Diagnosis not present

## 2018-06-18 DIAGNOSIS — M545 Low back pain: Secondary | ICD-10-CM | POA: Diagnosis not present

## 2018-06-18 DIAGNOSIS — Z9889 Other specified postprocedural states: Secondary | ICD-10-CM | POA: Diagnosis not present

## 2018-06-18 DIAGNOSIS — M5416 Radiculopathy, lumbar region: Secondary | ICD-10-CM | POA: Diagnosis not present

## 2018-06-18 DIAGNOSIS — M810 Age-related osteoporosis without current pathological fracture: Secondary | ICD-10-CM | POA: Diagnosis not present

## 2018-06-18 DIAGNOSIS — M5126 Other intervertebral disc displacement, lumbar region: Secondary | ICD-10-CM | POA: Diagnosis not present

## 2018-06-18 DIAGNOSIS — T84296A Other mechanical complication of internal fixation device of vertebrae, initial encounter: Secondary | ICD-10-CM | POA: Diagnosis not present

## 2018-06-18 LAB — HM DEXA SCAN

## 2018-06-20 DIAGNOSIS — T84296S Other mechanical complication of internal fixation device of vertebrae, sequela: Secondary | ICD-10-CM | POA: Diagnosis not present

## 2018-06-20 DIAGNOSIS — M810 Age-related osteoporosis without current pathological fracture: Secondary | ICD-10-CM | POA: Diagnosis not present

## 2018-06-21 DIAGNOSIS — M549 Dorsalgia, unspecified: Secondary | ICD-10-CM | POA: Diagnosis not present

## 2018-06-21 DIAGNOSIS — G20 Parkinson's disease: Secondary | ICD-10-CM | POA: Diagnosis not present

## 2018-06-21 DIAGNOSIS — Z9181 History of falling: Secondary | ICD-10-CM | POA: Diagnosis not present

## 2018-06-21 DIAGNOSIS — R296 Repeated falls: Secondary | ICD-10-CM | POA: Diagnosis not present

## 2018-06-24 ENCOUNTER — Telehealth: Payer: Self-pay | Admitting: Neurology

## 2018-06-24 ENCOUNTER — Other Ambulatory Visit: Payer: Self-pay | Admitting: Internal Medicine

## 2018-06-24 DIAGNOSIS — R296 Repeated falls: Secondary | ICD-10-CM | POA: Diagnosis not present

## 2018-06-24 DIAGNOSIS — G20 Parkinson's disease: Secondary | ICD-10-CM | POA: Diagnosis not present

## 2018-06-24 DIAGNOSIS — Z9181 History of falling: Secondary | ICD-10-CM | POA: Diagnosis not present

## 2018-06-24 DIAGNOSIS — M549 Dorsalgia, unspecified: Secondary | ICD-10-CM | POA: Diagnosis not present

## 2018-06-24 NOTE — Telephone Encounter (Signed)
Called Alexandra Lindsey with [facility]. No answer. No vm.

## 2018-06-24 NOTE — Telephone Encounter (Signed)
Alexandra Lindsey called from [facility]. She is needing to report a fall. She is needing more orders. Please Call. Thanks

## 2018-06-25 ENCOUNTER — Other Ambulatory Visit: Payer: Self-pay | Admitting: Internal Medicine

## 2018-06-25 ENCOUNTER — Other Ambulatory Visit: Payer: Self-pay | Admitting: *Deleted

## 2018-06-25 ENCOUNTER — Encounter: Payer: Self-pay | Admitting: *Deleted

## 2018-06-25 MED ORDER — ALENDRONATE SODIUM 70 MG PO TABS: ORAL_TABLET | ORAL | 3 refills | Status: DC

## 2018-06-25 NOTE — Telephone Encounter (Signed)
I have tried to call again with no answer and no vm.

## 2018-06-29 DIAGNOSIS — R296 Repeated falls: Secondary | ICD-10-CM | POA: Diagnosis not present

## 2018-06-29 DIAGNOSIS — G20 Parkinson's disease: Secondary | ICD-10-CM | POA: Diagnosis not present

## 2018-06-29 DIAGNOSIS — M549 Dorsalgia, unspecified: Secondary | ICD-10-CM | POA: Diagnosis not present

## 2018-06-29 DIAGNOSIS — Z9181 History of falling: Secondary | ICD-10-CM | POA: Diagnosis not present

## 2018-07-01 ENCOUNTER — Telehealth: Payer: Self-pay | Admitting: Neurology

## 2018-07-01 NOTE — Telephone Encounter (Signed)
Spoke with Elzie Rings and gave orders for her to see patient once weekly for maintenance physical therapy of PD.

## 2018-07-01 NOTE — Telephone Encounter (Signed)
Left message on machine for Gretchen with home health to call back.

## 2018-07-01 NOTE — Telephone Encounter (Signed)
Gretchen Called from [facility] wanting to extend orders for patient. She was wanting to see if she could make her an appointment for Aug 18th. She also was asking if it was okay to put the patient on a maintence program where she would only been seen once a week.

## 2018-07-15 ENCOUNTER — Telehealth: Payer: Self-pay | Admitting: Neurology

## 2018-07-15 DIAGNOSIS — G20 Parkinson's disease: Secondary | ICD-10-CM | POA: Diagnosis not present

## 2018-07-15 DIAGNOSIS — R296 Repeated falls: Secondary | ICD-10-CM | POA: Diagnosis not present

## 2018-07-15 DIAGNOSIS — M549 Dorsalgia, unspecified: Secondary | ICD-10-CM | POA: Diagnosis not present

## 2018-07-15 DIAGNOSIS — Z9181 History of falling: Secondary | ICD-10-CM | POA: Diagnosis not present

## 2018-07-15 NOTE — Telephone Encounter (Signed)
She needs to be seen.  Not seen here since April.  Hinton Dyer, please put her on cx list for next available.

## 2018-07-15 NOTE — Telephone Encounter (Signed)
Elzie Rings called from [facility] and left a voicemail just wanting to let Dr.[name]/[name] know that within the last 2 weeks this patient has had 8 falls. If you need to call her back it is [phone]. Thanks.

## 2018-07-15 NOTE — Telephone Encounter (Signed)
FYI

## 2018-07-15 NOTE — Telephone Encounter (Signed)
I put her on the cx list and a note to please call first

## 2018-07-19 NOTE — Progress Notes (Signed)
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 and likely radicular pain due to this.  She has sought a 2nd opinion.  She believes, as July, that most of her falls are related to poor compensation because of back pain.  She did not have nearly this number of falls prior to her surgery. 4.  RBD.  -  She did not want to take the clonazepam, primarily because of its addictive properties.  She did try it for a short period of time.  She reports doing well in this  regard.   5. Follow up is anticipated in the next few months, sooner should new neurologic issues arise.  Much greater than 50% of this visit was spent in counseling and coordinating care.  Total face to face time:  25 min

## 2018-07-22 DIAGNOSIS — Z9181 History of falling: Secondary | ICD-10-CM | POA: Diagnosis not present

## 2018-07-22 DIAGNOSIS — M549 Dorsalgia, unspecified: Secondary | ICD-10-CM | POA: Diagnosis not present

## 2018-07-22 DIAGNOSIS — R296 Repeated falls: Secondary | ICD-10-CM | POA: Diagnosis not present

## 2018-07-22 DIAGNOSIS — G20 Parkinson's disease: Secondary | ICD-10-CM | POA: Diagnosis not present

## 2018-07-23 ENCOUNTER — Ambulatory Visit: Payer: Self-pay | Admitting: Internal Medicine

## 2018-07-23 ENCOUNTER — Ambulatory Visit (INDEPENDENT_AMBULATORY_CARE_PROVIDER_SITE_OTHER): Payer: Medicare Other | Admitting: Neurology

## 2018-07-23 ENCOUNTER — Encounter: Payer: Self-pay | Admitting: Neurology

## 2018-07-23 VITALS — BP 122/78 | HR 76 | Ht 60.0 in | Wt 142.0 lb

## 2018-07-23 DIAGNOSIS — R296 Repeated falls: Secondary | ICD-10-CM | POA: Diagnosis not present

## 2018-07-23 DIAGNOSIS — M5416 Radiculopathy, lumbar region: Secondary | ICD-10-CM | POA: Diagnosis not present

## 2018-07-23 DIAGNOSIS — G20 Parkinson's disease: Secondary | ICD-10-CM | POA: Diagnosis not present

## 2018-07-23 MED ORDER — PRAMIPEXOLE DIHYDROCHLORIDE 0.5 MG PO TABS: 0.5000 mg | ORAL_TABLET | Freq: Three times a day (TID) | ORAL | 1 refills | Status: DC

## 2018-07-23 NOTE — Patient Instructions (Signed)
Take pramipexole 0.5 mg, 1 tablet three times per day (you can split the 1mg  tablet in 1/2 until yours run out if you want.  Use transport chair when you are out!  When walking at home make sure that someone is with you and next to you.  Don't let go of your walker!

## 2018-07-25 DIAGNOSIS — M961 Postlaminectomy syndrome, not elsewhere classified: Secondary | ICD-10-CM | POA: Diagnosis not present

## 2018-07-25 DIAGNOSIS — M810 Age-related osteoporosis without current pathological fracture: Secondary | ICD-10-CM | POA: Insufficient documentation

## 2018-07-25 DIAGNOSIS — I10 Essential (primary) hypertension: Secondary | ICD-10-CM | POA: Insufficient documentation

## 2018-07-25 DIAGNOSIS — M960 Pseudarthrosis after fusion or arthrodesis: Secondary | ICD-10-CM | POA: Diagnosis not present

## 2018-07-25 DIAGNOSIS — T84296S Other mechanical complication of internal fixation device of vertebrae, sequela: Secondary | ICD-10-CM | POA: Diagnosis not present

## 2018-07-29 DIAGNOSIS — M549 Dorsalgia, unspecified: Secondary | ICD-10-CM | POA: Diagnosis not present

## 2018-07-29 DIAGNOSIS — G20 Parkinson's disease: Secondary | ICD-10-CM | POA: Diagnosis not present

## 2018-07-29 DIAGNOSIS — R296 Repeated falls: Secondary | ICD-10-CM | POA: Diagnosis not present

## 2018-07-29 DIAGNOSIS — Z9181 History of falling: Secondary | ICD-10-CM | POA: Diagnosis not present

## 2018-07-31 DIAGNOSIS — M549 Dorsalgia, unspecified: Secondary | ICD-10-CM | POA: Diagnosis not present

## 2018-07-31 DIAGNOSIS — Z9181 History of falling: Secondary | ICD-10-CM | POA: Diagnosis not present

## 2018-07-31 DIAGNOSIS — R296 Repeated falls: Secondary | ICD-10-CM | POA: Diagnosis not present

## 2018-07-31 DIAGNOSIS — G20 Parkinson's disease: Secondary | ICD-10-CM | POA: Diagnosis not present

## 2018-08-01 ENCOUNTER — Encounter: Payer: Self-pay | Admitting: Physician Assistant

## 2018-08-01 ENCOUNTER — Ambulatory Visit (INDEPENDENT_AMBULATORY_CARE_PROVIDER_SITE_OTHER): Payer: Medicare Other | Admitting: Physician Assistant

## 2018-08-01 VITALS — BP 130/62 | HR 96 | Temp 97.5°F | Resp 16 | Ht 60.0 in | Wt 136.8 lb

## 2018-08-01 DIAGNOSIS — R6889 Other general symptoms and signs: Secondary | ICD-10-CM

## 2018-08-01 DIAGNOSIS — I10 Essential (primary) hypertension: Secondary | ICD-10-CM

## 2018-08-01 DIAGNOSIS — Z0000 Encounter for general adult medical examination without abnormal findings: Secondary | ICD-10-CM

## 2018-08-01 DIAGNOSIS — G20 Parkinson's disease: Secondary | ICD-10-CM

## 2018-08-01 DIAGNOSIS — K219 Gastro-esophageal reflux disease without esophagitis: Secondary | ICD-10-CM | POA: Diagnosis not present

## 2018-08-01 DIAGNOSIS — R7303 Prediabetes: Secondary | ICD-10-CM

## 2018-08-01 DIAGNOSIS — M5416 Radiculopathy, lumbar region: Secondary | ICD-10-CM

## 2018-08-01 DIAGNOSIS — R350 Frequency of micturition: Secondary | ICD-10-CM | POA: Diagnosis not present

## 2018-08-01 DIAGNOSIS — R195 Other fecal abnormalities: Secondary | ICD-10-CM

## 2018-08-01 DIAGNOSIS — Z0001 Encounter for general adult medical examination with abnormal findings: Secondary | ICD-10-CM | POA: Diagnosis not present

## 2018-08-01 DIAGNOSIS — E782 Mixed hyperlipidemia: Secondary | ICD-10-CM | POA: Diagnosis not present

## 2018-08-01 DIAGNOSIS — G4752 REM sleep behavior disorder: Secondary | ICD-10-CM

## 2018-08-01 DIAGNOSIS — M48062 Spinal stenosis, lumbar region with neurogenic claudication: Secondary | ICD-10-CM | POA: Diagnosis not present

## 2018-08-01 DIAGNOSIS — Z79899 Other long term (current) drug therapy: Secondary | ICD-10-CM

## 2018-08-01 DIAGNOSIS — M533 Sacrococcygeal disorders, not elsewhere classified: Secondary | ICD-10-CM

## 2018-08-01 DIAGNOSIS — E559 Vitamin D deficiency, unspecified: Secondary | ICD-10-CM

## 2018-08-01 MED ORDER — CIPROFLOXACIN HCL 500 MG PO TABS: 500.0000 mg | ORAL_TABLET | Freq: Two times a day (BID) | ORAL | 0 refills | Status: DC

## 2018-08-01 NOTE — Progress Notes (Signed)
MEDICARE ANNUAL WELLNESS VISIT AND FOLLOW UP Assessment:    Essential hypertension - continue medications, DASH diet, exercise and monitor at home. Call if greater than 130/80.  -     CBC with Differential/Platelet -     COMPLETE METABOLIC PANEL WITH GFR -     TSH -     Lipid panel  Hyperlipidemia, mixed check lipids decrease fatty foods increase activity.  -     CBC with Differential/Platelet -     COMPLETE METABOLIC PANEL WITH GFR -     TSH -     Lipid panel  Urinary frequency -     Urinalysis, Routine w reflex microscopic -     Urine Culture --     ciprofloxacin (CIPRO) 500 MG tablet; Take 1 tablet (500 mg total) by mouth 2 (two) times daily for 7 days.- states can take cipro, not levofloxacin.   Encounter for Medicare annual wellness exam 1 year  Parkinson's disease ([place]) Continue neuro follow up Dr. Carles Collet  Medication management  Positive colorectal cancer screening using Cologuard test Declines further follow up Saw Dr. Amedeo Plenty, no colonoscopy  Lumbar stenosis with neurogenic claudication Continue ortho follow up, going to get injections  Prediabetes Discussed disease progression and risks Discussed diet/exercise, weight management and risk modification  Lumbar radiculopathy Continue ortho follow up, going to get injections  Gastroesophageal reflux disease, esophagitis presence not specified Continue PPI/H2 blocker, diet discussed  SI (sacroiliac) joint dysfunction Continue ortho follow up, going to get injections  Vitamin D deficiency Continue supplement  RBD (REM behavioral disorder) Continue meds   Over 30 minutes of exam, counseling, chart review, and critical decision making was performed  Future Appointments  Date Time Provider [place]  08/09/2018 10:00 AM Liane Comber, NP [facility] None  09/17/2018 10:45 AM [name], Eustace Quail, DO [facility] None  02/04/2019  3:00 PM Unk Pinto, MD [facility] None     Plan:   During the  course of the visit the patient was educated and counseled about appropriate screening and preventive services including:    Pneumococcal vaccine   Influenza vaccine  Prevnar 13  Td vaccine  Screening electrocardiogram  Colorectal cancer screening  Diabetes screening  Glaucoma screening  Nutrition counseling    Subjective:  Alexandra Lindsey is a 77 y.o. female who presents for Medicare Annual Wellness Visit and 3 month follow up for HTN, hyperlipidemia, prediabetes, and vitamin D Def.   Her blood pressure has been controlled at home, today their BP is BP: 130/62 She does workout. She denies chest pain, shortness of breath, dizziness.   She has had urinary frequency, urgency and burning x 2 days. No itching, no discharge. No hematuria, back pain, flank pain, fever, chills.   She has an area on top of her head that she has noticed.   She is not on cholesterol medication and denies myalgias. Her cholesterol is at goal. The cholesterol last visit was:   Lab Results  Component Value Date   CHOL 193 01/02/2018   HDL 51 01/02/2018   LDLCALC 111 (H) 01/02/2018   TRIG 195 (H) 01/02/2018   CHOLHDL 3.8 01/02/2018   :  Lab Results  Component Value Date   HGBA1C 5.4 01/02/2018   Last GFR Lab Results  Component Value Date   GFRNONAA 65 01/02/2018     Lab Results  Component Value Date   GFRAA 76 01/02/2018   Patient is on Vitamin D supplement.   Lab Results  Component Value Date  VD25OH 35 01/02/2018     She has been doing well with her carbidopa levodopa.  She reports that she has had multiple falls.  She reports that she has not had any injuries but she does fall.  She is doing PT.  She is using a walker. She is seeing ortho doctor in [place] for her lower back pain.   She uses a bedside commode.  She is not driving, she can not walk up stairs well, she is in PT at this time. .    She had a + cologuard saw Dr. Amedeo Plenty, declines further colonoscopy, no blood in stool.    She is seeing ortho doctor in [place] for her lower back pain.    Medication Review: Current Outpatient Medications on File Prior to Visit  Medication Sig Dispense Refill  . acetaminophen (TYLENOL) 500 MG tablet Take 1,000 mg by mouth 2 (two) times daily as needed for moderate pain or headache.    . albuterol (PROVENTIL HFA;VENTOLIN HFA) 108 (90 Base) MCG/ACT inhaler Inhale 1-2 puffs into the lungs every 6 (six) hours as needed for wheezing or shortness of breath.    [name] alendronate (FOSAMAX) 70 MG tablet Take 1 tablet per week with a full glass of water on an empty stomach for 60 minutes for Osteoporosis. 14 tablet 3  . carbidopa-levodopa (SINEMET IR) 25-100 MG tablet TAKE 3 TABLETS AT 5AM, TAKE 2 TABS AT 9AM, TAKE 1 TAB AT 1PM, TAKE 2 TABS AT 5PM, TAKE 1 TAB AT NIGHT, AND TAKE EXTRATAB AS NEEDED 1000 tablet 1  . Cholecalciferol (VITAMIN D3) 5000 units CAPS Take 5,000 Units by mouth 2 (two) times daily.    [name] gabapentin (NEURONTIN) 100 MG capsule Take 1 capsule 2 to 3 x/ day for pain 90 capsule 2  . gabapentin (NEURONTIN) 600 MG tablet Take 1/2 to 1 tablet 3 to 4 x / day for Chronic & Acute Pain (Patient taking differently: Take 300 mg by mouth at bedtime. ) 120 tablet 3  . hydrocortisone cream 1 % Apply 1 application topically daily as needed for itching.    . naproxen sodium (ALEVE) 220 MG tablet Take 220 mg by mouth daily.    . polyethylene glycol (MIRALAX / GLYCOLAX) packet Take 17 g by mouth daily.    . pramipexole (MIRAPEX) 0.5 MG tablet Take 1 tablet (0.5 mg total) by mouth 3 (three) times daily. 270 tablet 1  . ranitidine (ZANTAC) 150 MG tablet Take 150 mg by mouth daily as needed for heartburn.     . sertraline (ZOLOFT) 100 MG tablet Take 1/2 to 1 tablet daily for Mood & Chronic Pain 30 tablet 2   No current facility-administered medications on file prior to visit.     Allergies: Allergies  Allergen Reactions  . Macrobid [Nitrofurantoin Macrocrystal] Swelling and Rash  .  Baclofen Other (See Comments)    Kept her awake, affected memory  . Cephalosporins Other (See Comments)    Cannot sleep  . Codeine Nausea Only    dizziness  . Darvocet [Propoxyphene N-Acetaminophen] Nausea And Vomiting  . Diclofenac Rash    The gel caused a rash  . Levaquin [Levofloxacin] Hives  . Penicillins Rash  . Selegiline Hcl Rash  . Tramadol Nausea And Vomiting  . Triamcinolone Itching    Current Problems (verified) has Parkinson's disease ([place]); Sleep disorder; RBD (REM behavioral disorder); Hyperlipidemia; Hypertension; GERD (gastroesophageal reflux disease); Prediabetes; Vitamin D deficiency; Medication management; Piriformis syndrome of left side; SI (sacroiliac) joint  dysfunction; Greater trochanteric bursitis of left hip; Greater trochanteric bursitis of right hip; Lumbar radiculopathy; Lumbar stenosis with neurogenic claudication; Postprocedural pseudomeningocele; Positive colorectal cancer screening using Cologuard test; and Right hip pain on their problem list.  Screening Tests Immunization History  Administered Date(s) Administered  . Influenza, High Dose Seasonal PF 09/08/2014, 10/04/2015  . Influenza-Unspecified 09/29/2016, 08/08/2017  . Pneumococcal Conjugate-13 11/10/2015  . Pneumococcal Polysaccharide-23 02/26/2017  . Pneumococcal-Unspecified 11/27/2006  . Td 09/08/2013  . Zoster 06/27/2006    Preventative care: Last colonoscopy: 2008 DEXA: 2019 x 3 weeks, she is doing well with it Mammogram: 09/2017, Dr. Ulanda Edison Ct lumbar 09/2017 cologuard 02/2017 saw Dr. Amedeo Plenty will just monitor  Names of Other Physician/Practitioners you currently use: 1.  [facility] here for primary care 2. Dr. Sabra Heck, eye doctor, last visit 2018, has appointment in Oct 3. Dr. Kathryne Sharper, dentist, last visit 2018  Patient Care Team: Unk Pinto, MD as PCP - General (Internal Medicine) Teena Irani, MD (Inactive) as Consulting Physician  (Gastroenterology) Troy Sine, MD as Consulting Physician (Cardiology) Lyndal Pulley, DO as Attending Physician (Family Medicine) [name], Eustace Quail, DO as Consulting Physician (Neurology) Marybelle Killings, MD as Consulting Physician (Orthopedic Surgery)  Surgical: She  has a past surgical history that includes Abdominal hysterectomy; Hernia repair; Rotator cuff repair (Left, 10-07-12); Bunionectomy (Bilateral, 11-11-3); Cardiac catheterization (Bilateral, 2013); left heart catheterization with coronary angiogram (N/A, 07/03/2012); Appendectomy; Colonoscopy; Back surgery (12/20/2015); Repair of cerebrospinal fluid leak (N/A, 12/24/2015); and Back surgery (09/27/2017). Family Her family history includes CVA in her brother; Esophageal cancer in her mother; Heart attack in her mother. Social history  She reports that she has never smoked. She has never used smokeless tobacco. She reports that she does not drink alcohol or use drugs.  MEDICARE WELLNESS OBJECTIVES: Physical activity: Current Exercise Habits: The patient does not participate in regular exercise at present, Exercise limited by: orthopedic condition(s);neurologic condition(s) Cardiac risk factors: Cardiac Risk Factors include: advanced age (>53men, >39 women);hypertension;sedentary lifestyle Depression/mood screen:   Depression screen [facility] 2/9 08/01/2018  Decreased Interest 2  Down, Depressed, Hopeless 2  PHQ - 2 Score 4  Altered sleeping 1  Tired, decreased energy 3  Change in appetite 0  Feeling bad or failure about yourself  2  Trouble concentrating 2  Moving slowly or fidgety/restless 1  Suicidal thoughts 2  PHQ-9 Score 15  Difficult doing work/chores Somewhat difficult    ADLs:  In your present state of health, do you have any difficulty performing the following activities: 08/01/2018 04/27/2018  Hearing? N N  Vision? N N  Difficulty concentrating or making decisions? Y N  Walking or climbing stairs? Y Y  Dressing or bathing?  Y Y  Doing errands, shopping? Y Y  Some recent data might be hidden     Cognitive Testing  Alert? Yes  Normal Appearance?Yes  Oriented to person? Yes  Place? Yes   Time? Yes  Recall of three objects?  Yes  Can perform simple calculations? Yes  Displays appropriate judgment?Yes  Can read the correct time from a watch face?Yes  EOL planning: Does Patient Have a Medical Advance Directive?: Yes Type of Advance Directive: Healthcare Power of Attorney, Living will   Objective:   Today's Vitals   08/01/18 1114  BP: 130/62  Pulse: 96  Resp: 16  Temp: (!) 97.5 F (36.4 C)  Weight: 136 lb 12.8 oz (62.1 kg)  Height: 5' (1.524 m)   Body mass index is 26.[place]  kg/m.  General appearance: alert, no distress, WD/WN, female HEENT: normocephalic, sclerae anicteric, TMs pearly, nares patent, no discharge or erythema, pharynx normal, left ear with wax, will take care of at home.  Oral cavity: MMM, no lesions Neck: supple, no lymphadenopathy, no thyromegaly, no masses Heart: RRR, normal S1, S2, no murmurs Lungs: CTA bilaterally, no wheezes, rhonchi, or rales Abdomen: +bs, soft, non tender, slightly distended, no masses, no hepatomegaly, no splenomegaly Musculoskeletal: tenderness lower back, and left hip, no swelling, no obvious deformity Extremities: no edema, no cyanosis, no clubbing Pulses: 2+ symmetric, upper and lower extremities, normal cap refill Neurological: alert, oriented x 3, CN2-12 intact, strength decreased upper extremities and lower extremities, sensation normal throughout, DTRs 2+ throughout, walks with a walker, has baseline tremor and shuffling gait, mild mask facies.  Psychiatric: normal affect, behavior normal, pleasant   Medicare Attestation I have personally reviewed: The patient's medical and social history Their use of alcohol, tobacco or illicit drugs Their current medications and supplements The patient's functional ability including ADLs,fall risks, home safety  risks, cognitive, and hearing and visual impairment Diet and physical activities Evidence for depression or mood disorders  The patient's weight, height, BMI, and visual acuity have been recorded in the chart.  I have made referrals, counseling, and provided education to the patient based on review of the above and I have provided the patient with a written personalized care plan for preventive services.     Vicie Mutters, PA-C   08/01/2018

## 2018-08-01 NOTE — Patient Instructions (Signed)
Use a dropper or use a cap to put peroxide, olive oil,mineral oil or canola oil in the effected ear- 2-3 times a week. Let it soak for 20-30 min then you can take a shower or use a baby bulb with warm water to wash out the ear wax.  Do not use Qtips  If you have any dark black stool call the office

## 2018-08-03 LAB — URINALYSIS, ROUTINE W REFLEX MICROSCOPIC
Bilirubin Urine: NEGATIVE
Glucose, UA: NEGATIVE
Nitrite: POSITIVE — AB
Specific Gravity, Urine: 1.024 (ref 1.001–1.03)
pH: <=5 (ref 5.0–8.0)

## 2018-08-03 LAB — COMPLETE METABOLIC PANEL WITH GFR
AG Ratio: 2.2 (calc) (ref 1.0–2.5)
ALBUMIN MSPROF: 4.3 g/dL (ref 3.6–5.1)
ALKALINE PHOSPHATASE (APISO): 110 U/L (ref 33–130)
ALT: 3 U/L — ABNORMAL LOW (ref 6–29)
AST: 9 U/L — ABNORMAL LOW (ref 10–35)
BUN/Creatinine Ratio: 43 (calc) — ABNORMAL HIGH (ref 6–22)
BUN: 45 mg/dL — AB (ref 7–25)
CALCIUM: 9.4 mg/dL (ref 8.6–10.4)
CO2: 23 mmol/L (ref 20–32)
CREATININE: 1.05 mg/dL — AB (ref 0.60–0.93)
Chloride: 108 mmol/L (ref 98–110)
GFR, EST NON AFRICAN AMERICAN: 52 mL/min/{1.73_m2} — AB (ref >=60)
GFR, Est African American: 60 mL/min/{1.73_m2} (ref >=60)
GLUCOSE: 97 mg/dL (ref 65–99)
Globulin: 2 g/dL (calc) (ref 1.9–3.7)
Potassium: 4.5 mmol/L (ref 3.5–5.3)
Sodium: 139 mmol/L (ref 135–146)
TOTAL PROTEIN: 6.3 g/dL (ref 6.1–8.1)
Total Bilirubin: 0.3 mg/dL (ref 0.2–1.2)

## 2018-08-03 LAB — CBC WITH DIFFERENTIAL/PLATELET
BASOS PCT: 0.9 %
Basophils Absolute: 50 cells/uL (ref 0–200)
EOS PCT: 3.1 %
Eosinophils Absolute: 171 cells/uL (ref 15–500)
HCT: 34.9 % — ABNORMAL LOW (ref 35.0–45.0)
HEMOGLOBIN: 11.4 g/dL — AB (ref 11.7–15.5)
LYMPHS ABS: 1216 {cells}/uL (ref 850–3900)
MCH: 29.8 pg (ref 27.0–33.0)
MCHC: 32.7 g/dL (ref 32.0–36.0)
MCV: 91.1 fL (ref 80.0–100.0)
MONOS PCT: 10.1 %
MPV: 11 fL (ref 7.5–12.5)
Neutro Abs: 3509 cells/uL (ref 1500–7800)
Neutrophils Relative %: 63.8 %
Platelets: 301 10*3/uL (ref 140–400)
RBC: 3.83 10*6/uL (ref 3.80–5.10)
RDW: 13 % (ref 11.0–15.0)
Total Lymphocyte: 22.1 %
WBC mixed population: 556 cells/uL (ref 200–950)
WBC: 5.5 10*3/uL (ref 3.8–10.8)

## 2018-08-03 LAB — LIPID PANEL
CHOLESTEROL: 204 mg/dL — AB (ref <200)
HDL: 47 mg/dL — ABNORMAL LOW (ref >50)
LDL CHOLESTEROL (CALC): 126 mg/dL — AB
Non-HDL Cholesterol (Calc): 157 mg/dL (calc) — ABNORMAL HIGH (ref <130)
TRIGLYCERIDES: 185 mg/dL — AB (ref <150)
Total CHOL/HDL Ratio: 4.3 (calc) (ref <5.0)

## 2018-08-03 LAB — URINE CULTURE
MICRO NUMBER: 91064177
SPECIMEN QUALITY:: ADEQUATE

## 2018-08-03 LAB — TSH: TSH: 0.97 m[IU]/L (ref 0.40–4.50)

## 2018-08-05 DIAGNOSIS — Z9181 History of falling: Secondary | ICD-10-CM | POA: Diagnosis not present

## 2018-08-05 DIAGNOSIS — M549 Dorsalgia, unspecified: Secondary | ICD-10-CM | POA: Diagnosis not present

## 2018-08-05 DIAGNOSIS — G20 Parkinson's disease: Secondary | ICD-10-CM | POA: Diagnosis not present

## 2018-08-05 DIAGNOSIS — R296 Repeated falls: Secondary | ICD-10-CM | POA: Diagnosis not present

## 2018-08-06 ENCOUNTER — Telehealth: Payer: Self-pay | Admitting: Neurology

## 2018-08-06 NOTE — Telephone Encounter (Signed)
Dr. Carles Collet - fyi.

## 2018-08-06 NOTE — Telephone Encounter (Signed)
Alexandra Lindsey called and needed to let Dr. Carles Lindsey know that this patient has fallen again but has no injuries.

## 2018-08-08 DIAGNOSIS — G894 Chronic pain syndrome: Secondary | ICD-10-CM | POA: Diagnosis not present

## 2018-08-08 DIAGNOSIS — M792 Neuralgia and neuritis, unspecified: Secondary | ICD-10-CM | POA: Diagnosis not present

## 2018-08-08 DIAGNOSIS — M961 Postlaminectomy syndrome, not elsewhere classified: Secondary | ICD-10-CM | POA: Diagnosis not present

## 2018-08-08 DIAGNOSIS — M5416 Radiculopathy, lumbar region: Secondary | ICD-10-CM | POA: Diagnosis not present

## 2018-08-09 ENCOUNTER — Ambulatory Visit: Payer: Self-pay | Admitting: Adult Health

## 2018-08-12 DIAGNOSIS — G20 Parkinson's disease: Secondary | ICD-10-CM | POA: Diagnosis not present

## 2018-08-12 DIAGNOSIS — M5416 Radiculopathy, lumbar region: Secondary | ICD-10-CM | POA: Diagnosis not present

## 2018-08-12 DIAGNOSIS — M549 Dorsalgia, unspecified: Secondary | ICD-10-CM | POA: Diagnosis not present

## 2018-08-12 DIAGNOSIS — Z9181 History of falling: Secondary | ICD-10-CM | POA: Diagnosis not present

## 2018-08-12 DIAGNOSIS — R296 Repeated falls: Secondary | ICD-10-CM | POA: Diagnosis not present

## 2018-08-13 ENCOUNTER — Telehealth: Payer: Self-pay | Admitting: *Deleted

## 2018-08-13 NOTE — Telephone Encounter (Signed)
The patient states she is having severe muscle aches for about 2 days following her Fosamax weekly medication. Per Dr Melford Aase, the patient was informed to leave off the Fosamax for 3 weeks and then, try to restart.  The patient is aware.

## 2018-08-19 DIAGNOSIS — G20 Parkinson's disease: Secondary | ICD-10-CM | POA: Diagnosis not present

## 2018-08-19 DIAGNOSIS — Z9181 History of falling: Secondary | ICD-10-CM | POA: Diagnosis not present

## 2018-08-19 DIAGNOSIS — R296 Repeated falls: Secondary | ICD-10-CM | POA: Diagnosis not present

## 2018-08-19 DIAGNOSIS — M549 Dorsalgia, unspecified: Secondary | ICD-10-CM | POA: Diagnosis not present

## 2018-08-22 DIAGNOSIS — Z23 Encounter for immunization: Secondary | ICD-10-CM | POA: Diagnosis not present

## 2018-08-26 DIAGNOSIS — Z9181 History of falling: Secondary | ICD-10-CM | POA: Diagnosis not present

## 2018-08-26 DIAGNOSIS — R296 Repeated falls: Secondary | ICD-10-CM | POA: Diagnosis not present

## 2018-08-26 DIAGNOSIS — G20 Parkinson's disease: Secondary | ICD-10-CM | POA: Diagnosis not present

## 2018-08-26 DIAGNOSIS — M549 Dorsalgia, unspecified: Secondary | ICD-10-CM | POA: Diagnosis not present

## 2018-08-27 DIAGNOSIS — M961 Postlaminectomy syndrome, not elsewhere classified: Secondary | ICD-10-CM | POA: Diagnosis not present

## 2018-08-27 DIAGNOSIS — M5416 Radiculopathy, lumbar region: Secondary | ICD-10-CM | POA: Diagnosis not present

## 2018-08-27 DIAGNOSIS — G894 Chronic pain syndrome: Secondary | ICD-10-CM | POA: Diagnosis not present

## 2018-08-27 DIAGNOSIS — M792 Neuralgia and neuritis, unspecified: Secondary | ICD-10-CM | POA: Diagnosis not present

## 2018-08-30 DIAGNOSIS — M5126 Other intervertebral disc displacement, lumbar region: Secondary | ICD-10-CM | POA: Diagnosis not present

## 2018-09-02 DIAGNOSIS — Z9181 History of falling: Secondary | ICD-10-CM | POA: Diagnosis not present

## 2018-09-02 DIAGNOSIS — R296 Repeated falls: Secondary | ICD-10-CM | POA: Diagnosis not present

## 2018-09-02 DIAGNOSIS — G20 Parkinson's disease: Secondary | ICD-10-CM | POA: Diagnosis not present

## 2018-09-02 DIAGNOSIS — M549 Dorsalgia, unspecified: Secondary | ICD-10-CM | POA: Diagnosis not present

## 2018-09-05 ENCOUNTER — Other Ambulatory Visit: Payer: Self-pay | Admitting: Internal Medicine

## 2018-09-05 ENCOUNTER — Telehealth: Payer: Self-pay | Admitting: *Deleted

## 2018-09-05 DIAGNOSIS — I10 Essential (primary) hypertension: Secondary | ICD-10-CM

## 2018-09-05 MED ORDER — ATENOLOL 50 MG PO TABS: ORAL_TABLET | ORAL | 1 refills | Status: DC

## 2018-09-05 MED ORDER — BISOPROLOL-HYDROCHLOROTHIAZIDE 5-6.25 MG PO TABS: ORAL_TABLET | ORAL | 1 refills | Status: DC

## 2018-09-05 MED ORDER — LISINOPRIL 20 MG PO TABS: ORAL_TABLET | ORAL | 1 refills | Status: DC

## 2018-09-05 NOTE — Telephone Encounter (Signed)
Patient called and reported her BP is elevated at 209/105 and has been high this PM.  Her readings range from 165/107 to 209/105. He says she feels weak and her head feels foggy. Dr Melford Aase sent in an RX for Atenolol 50 mg, which is to take as soon as she gets the RX. After that she will take Atenolol every morning and Lisinopril 20 mg every evening.  The patient will continue to monitor her BP.

## 2018-09-09 DIAGNOSIS — M961 Postlaminectomy syndrome, not elsewhere classified: Secondary | ICD-10-CM | POA: Diagnosis not present

## 2018-09-09 DIAGNOSIS — M5416 Radiculopathy, lumbar region: Secondary | ICD-10-CM | POA: Diagnosis not present

## 2018-09-09 DIAGNOSIS — M792 Neuralgia and neuritis, unspecified: Secondary | ICD-10-CM | POA: Diagnosis not present

## 2018-09-09 DIAGNOSIS — G894 Chronic pain syndrome: Secondary | ICD-10-CM | POA: Diagnosis not present

## 2018-09-13 ENCOUNTER — Telehealth: Payer: Self-pay | Admitting: *Deleted

## 2018-09-13 DIAGNOSIS — M549 Dorsalgia, unspecified: Secondary | ICD-10-CM | POA: Diagnosis not present

## 2018-09-13 DIAGNOSIS — R296 Repeated falls: Secondary | ICD-10-CM | POA: Diagnosis not present

## 2018-09-13 DIAGNOSIS — Z9181 History of falling: Secondary | ICD-10-CM | POA: Diagnosis not present

## 2018-09-13 DIAGNOSIS — G20 Parkinson's disease: Secondary | ICD-10-CM | POA: Diagnosis not present

## 2018-09-13 NOTE — Progress Notes (Signed)
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 confrontational testing. The speech is fluent and clear. Soft palate rises symmetrically and there is no tongue deviation. Hearing is intact to conversational tone. Sensation: Sensation is intact to light touch throughout. Motor: Strength is 5/5 in the bilateral upper and lower extremities.   Shoulder shrug is equal and symmetric.  There is no pronator drift.  Movement examination: Tone: There is normal tone in the UE/LE Abnormal movements: there is no dyskinesia or  tremor today Coordination:  There is decremation with finger taps bilaterally Gait and Station: The patient requires assist out of the chair by her husband.  She is short stepped when she first starts.  She does better with the u step than she does with the regular walker.  ASSESSMENT/PLAN:  1.  Idiopathic PD.    -continue Carbidopa Levodopa as follows: 3 tablets at 5:00 am, 2 tablets at 9:00 am, 1 tablet at 1:00 pm, 2 tablets at 5:00 pm and one at night.  -For now, continue pramipexole 0.5 mg 3 times per day.  I would like to get her off of that altogether because of hallucinations and daytime hypersomnolence, but decided to hold on changing that further  because she has so many balance changes because of back pain and I worry about worsening parkinsonism causing further balance change.  -Talk to her again about her numerous falls.  Today, her husband states that almost all of them were related to her letting go with a walker (which she has not told me previously).  Talked about never letting go of the walker, but also talked about staying seated.  She has a transport chair and she should use it.  Talked extensively about safety and the risks of not staying seated, including but not limited to hip fracture and subdural hemorrhage 2.  Urinary incontinence and frequency  -doing better in this regard 3.  Back pain  -She had surgery September 27, 2017 with significant postoperative pain.  She has had loosening of screws and likely radicular pain due to this.  She has sought a 2nd opinion.  She thinks that her recent falls are all due to back pain.  Her new surgeon has told her that unless she goes to the [facility], he would not recommend surgery.  She [facility]  I recommended Dr. Wende Mott.  She will consider whether or not she wants a referral.  She just had an MRI completed demonstrating marrow edema and significant degenerative change at the T12-L1 region. 4.  RBD.  -  She did not want to take the clonazepam, primarily because of its addictive properties.  She did try it for a short period of time.  She reports that she is screaming out some, but has not had any falling out of the bed. 5.  Follow-up in 4 to 5 months.  Greater than 50% of the 25-minute visit spent in counseling with the patient and her husband.

## 2018-09-13 NOTE — Telephone Encounter (Signed)
Elzie Rings, PT form home health, called and reported the patient's BP is 78/50, 2 1/2 hours after taking Atenolol 50 mg this morning.  She took her Lisinopril 20 mg last night. Per Dr Melford Aase, the therapist took her BP sitting( 72/43 p. 81) and standing (86/57 p. 83).  Per Dr Melford Aase, leave off all BP medications, force fluids and eat salty foods and take her BP standing. She was advised to call Dr Melford Aase, if BP continues to be low.

## 2018-09-16 ENCOUNTER — Telehealth: Payer: Self-pay | Admitting: *Deleted

## 2018-09-16 DIAGNOSIS — G20 Parkinson's disease: Secondary | ICD-10-CM | POA: Diagnosis not present

## 2018-09-16 DIAGNOSIS — M549 Dorsalgia, unspecified: Secondary | ICD-10-CM | POA: Diagnosis not present

## 2018-09-16 DIAGNOSIS — Z9181 History of falling: Secondary | ICD-10-CM | POA: Diagnosis not present

## 2018-09-16 DIAGNOSIS — R296 Repeated falls: Secondary | ICD-10-CM | POA: Diagnosis not present

## 2018-09-16 NOTE — Telephone Encounter (Signed)
A message was left, to inform Alexandra Lindsey, the patient should stay off her BP medications, due to the low BP readings of 82/60-standing and 88/60 while sitting. Per Dr Melford Aase, the patient should monitior her BP, increase her fluid intake and add salty items to her diet.

## 2018-09-17 ENCOUNTER — Encounter

## 2018-09-17 ENCOUNTER — Encounter: Payer: Self-pay | Admitting: Neurology

## 2018-09-17 ENCOUNTER — Ambulatory Visit (INDEPENDENT_AMBULATORY_CARE_PROVIDER_SITE_OTHER): Payer: Medicare Other | Admitting: Neurology

## 2018-09-17 VITALS — BP 120/78 | HR 82 | Ht 60.0 in | Wt 136.2 lb

## 2018-09-17 DIAGNOSIS — G20 Parkinson's disease: Secondary | ICD-10-CM | POA: Diagnosis not present

## 2018-09-17 DIAGNOSIS — M5416 Radiculopathy, lumbar region: Secondary | ICD-10-CM

## 2018-09-17 MED ORDER — AMBULATORY NON FORMULARY MEDICATION: 0 refills | Status: DC

## 2018-09-17 NOTE — Patient Instructions (Addendum)
I would recommend Phylliss Blakes at [facility] for your back.  Let me know if you would like a referral.  No changes in your Parkinsons medication today.

## 2018-09-23 DIAGNOSIS — G894 Chronic pain syndrome: Secondary | ICD-10-CM | POA: Diagnosis not present

## 2018-09-23 DIAGNOSIS — M961 Postlaminectomy syndrome, not elsewhere classified: Secondary | ICD-10-CM | POA: Diagnosis not present

## 2018-09-23 DIAGNOSIS — M5416 Radiculopathy, lumbar region: Secondary | ICD-10-CM | POA: Diagnosis not present

## 2018-09-23 DIAGNOSIS — M792 Neuralgia and neuritis, unspecified: Secondary | ICD-10-CM | POA: Diagnosis not present

## 2018-09-27 DIAGNOSIS — M549 Dorsalgia, unspecified: Secondary | ICD-10-CM | POA: Diagnosis not present

## 2018-09-27 DIAGNOSIS — Z9181 History of falling: Secondary | ICD-10-CM | POA: Diagnosis not present

## 2018-09-27 DIAGNOSIS — R296 Repeated falls: Secondary | ICD-10-CM | POA: Diagnosis not present

## 2018-09-27 DIAGNOSIS — G20 Parkinson's disease: Secondary | ICD-10-CM | POA: Diagnosis not present

## 2018-09-29 DIAGNOSIS — Z9181 History of falling: Secondary | ICD-10-CM | POA: Diagnosis not present

## 2018-09-29 DIAGNOSIS — R296 Repeated falls: Secondary | ICD-10-CM | POA: Diagnosis not present

## 2018-09-29 DIAGNOSIS — M549 Dorsalgia, unspecified: Secondary | ICD-10-CM | POA: Diagnosis not present

## 2018-09-29 DIAGNOSIS — G20 Parkinson's disease: Secondary | ICD-10-CM | POA: Diagnosis not present

## 2018-09-30 ENCOUNTER — Telehealth: Payer: Self-pay | Admitting: Neurology

## 2018-09-30 NOTE — Telephone Encounter (Signed)
Gretchen with [facility] called and I gave verbal orders for them to continue seeing patient for maintenance [phone].

## 2018-10-04 ENCOUNTER — Telehealth: Payer: Self-pay | Admitting: Neurology

## 2018-10-04 DIAGNOSIS — Z9181 History of falling: Secondary | ICD-10-CM | POA: Diagnosis not present

## 2018-10-04 DIAGNOSIS — M549 Dorsalgia, unspecified: Secondary | ICD-10-CM | POA: Diagnosis not present

## 2018-10-04 DIAGNOSIS — G20 Parkinson's disease: Secondary | ICD-10-CM | POA: Diagnosis not present

## 2018-10-04 DIAGNOSIS — R296 Repeated falls: Secondary | ICD-10-CM | POA: Diagnosis not present

## 2018-10-04 NOTE — Telephone Encounter (Signed)
Dr. [name] - FYI. 

## 2018-10-04 NOTE — Telephone Encounter (Signed)
Elzie Rings from [facility] calling to update Dr.[name] on patient. Patient has had two falls since last week due to not using her walker and her back back has recorded a 10/10 for the past two visits.

## 2018-10-07 DIAGNOSIS — Z9181 History of falling: Secondary | ICD-10-CM | POA: Diagnosis not present

## 2018-10-07 DIAGNOSIS — M549 Dorsalgia, unspecified: Secondary | ICD-10-CM | POA: Diagnosis not present

## 2018-10-07 DIAGNOSIS — R296 Repeated falls: Secondary | ICD-10-CM | POA: Diagnosis not present

## 2018-10-07 DIAGNOSIS — G20 Parkinson's disease: Secondary | ICD-10-CM | POA: Diagnosis not present

## 2018-10-13 DIAGNOSIS — Z9181 History of falling: Secondary | ICD-10-CM | POA: Diagnosis not present

## 2018-10-13 DIAGNOSIS — R296 Repeated falls: Secondary | ICD-10-CM | POA: Diagnosis not present

## 2018-10-13 DIAGNOSIS — M549 Dorsalgia, unspecified: Secondary | ICD-10-CM | POA: Diagnosis not present

## 2018-10-13 DIAGNOSIS — G20 Parkinson's disease: Secondary | ICD-10-CM | POA: Diagnosis not present

## 2018-10-14 ENCOUNTER — Encounter: Payer: Self-pay | Admitting: Internal Medicine

## 2018-10-14 ENCOUNTER — Ambulatory Visit (HOSPITAL_COMMUNITY)
Admission: RE | Admit: 2018-10-14 | Discharge: 2018-10-14 | Disposition: A | Discharge status: Home / Self Care | Payer: Medicare Other | Source: Ambulatory Visit | Attending: Internal Medicine | Admitting: Internal Medicine

## 2018-10-14 ENCOUNTER — Other Ambulatory Visit: Payer: Self-pay

## 2018-10-14 ENCOUNTER — Ambulatory Visit (INDEPENDENT_AMBULATORY_CARE_PROVIDER_SITE_OTHER): Payer: Medicare Other | Admitting: Internal Medicine

## 2018-10-14 ENCOUNTER — Other Ambulatory Visit: Payer: Self-pay | Admitting: Internal Medicine

## 2018-10-14 VITALS — BP 120/68 | HR 70 | Temp 97.5°F | Ht 60.0 in | Wt 135.0 lb

## 2018-10-14 DIAGNOSIS — F329 Major depressive disorder, single episode, unspecified: Secondary | ICD-10-CM

## 2018-10-14 DIAGNOSIS — G20 Parkinson's disease: Secondary | ICD-10-CM | POA: Insufficient documentation

## 2018-10-14 DIAGNOSIS — M7072 Other bursitis of hip, left hip: Secondary | ICD-10-CM | POA: Diagnosis not present

## 2018-10-14 DIAGNOSIS — M25552 Pain in left hip: Secondary | ICD-10-CM | POA: Insufficient documentation

## 2018-10-14 DIAGNOSIS — S7002XA Contusion of left hip, initial encounter: Secondary | ICD-10-CM

## 2018-10-14 DIAGNOSIS — F32A Depression, unspecified: Secondary | ICD-10-CM

## 2018-10-14 DIAGNOSIS — R2681 Unsteadiness on feet: Secondary | ICD-10-CM

## 2018-10-14 DIAGNOSIS — M7062 Trochanteric bursitis, left hip: Secondary | ICD-10-CM | POA: Diagnosis not present

## 2018-10-14 DIAGNOSIS — S7002XS Contusion of left hip, sequela: Secondary | ICD-10-CM

## 2018-10-14 DIAGNOSIS — S79912A Unspecified injury of left hip, initial encounter: Secondary | ICD-10-CM | POA: Diagnosis not present

## 2018-10-14 MED ORDER — SERTRALINE HCL 100 MG PO TABS: ORAL_TABLET | ORAL | 3 refills | Status: DC

## 2018-10-14 MED ORDER — PREDNISONE 20 MG PO TABS
ORAL_TABLET | ORAL | 0 refills | Status: DC
Start: 2018-10-14 — End: 2018-10-31

## 2018-10-14 MED ORDER — BUPRENORPHINE 10 MCG/HR TD PTWK: 10.0000 ug | MEDICATED_PATCH | TRANSDERMAL | Status: DC

## 2018-10-14 NOTE — Patient Instructions (Signed)
Fall Prevention in the Home Falls can cause injuries and can affect people from all age groups. There are many simple things that you can do to make your home safe and to help prevent falls. What can I do on the outside of my home?  Regularly repair the edges of walkways and driveways and fix any cracks.  Remove high doorway thresholds.  Trim any shrubbery on the main path into your home.  Use bright outdoor lighting.  Clear walkways of debris and clutter, including tools and rocks.  Regularly check that handrails are securely fastened and in good repair. Both sides of any steps should have handrails.  Install guardrails along the edges of any raised decks or porches.  Have leaves, snow, and ice cleared regularly.  Use sand or salt on walkways during winter months.  In the garage, clean up any spills right away, including grease or oil spills. What can I do in the bathroom?  Use night lights.  Install grab bars by the toilet and in the tub and shower. Do not use towel bars as grab bars.  Use non-skid mats or decals on the floor of the tub or shower.  If you need to sit down while you are in the shower, use a plastic, non-slip stool.  Keep the floor dry. Immediately clean up any water that spills on the floor.  Remove soap buildup in the tub or shower on a regular basis.  Attach bath mats securely with double-sided non-slip rug tape.  Remove throw rugs and other tripping hazards from the floor. What can I do in the bedroom?  Use night lights.  Make sure that a bedside light is easy to reach.  Do not use oversized bedding that drapes onto the floor.  Have a firm chair that has side arms to use for getting dressed.  Remove throw rugs and other tripping hazards from the floor. What can I do in the kitchen?  Clean up any spills right away.  Avoid walking on wet floors.  Place frequently used items in easy-to-reach places.  If you need to reach for something above  you, use a sturdy step stool that has a grab bar.  Keep electrical cables out of the way.  Do not use floor polish or wax that makes floors slippery. If you have to use wax, make sure that it is non-skid floor wax.  Remove throw rugs and other tripping hazards from the floor. What can I do in the stairways?  Do not leave any items on the stairs.  Make sure that there are handrails on both sides of the stairs. Fix handrails that are broken or loose. Make sure that handrails are as long as the stairways.  Check any carpeting to make sure that it is firmly attached to the stairs. Fix any carpet that is loose or worn.  Avoid having throw rugs at the top or bottom of stairways, or secure the rugs with carpet tape to prevent them from moving.  Make sure that you have a light switch at the top of the stairs and the bottom of the stairs. If you do not have them, have them installed. What are some other fall prevention tips?  Wear closed-toe shoes that fit well and support your feet. Wear shoes that have rubber soles or low heels.  When you use a stepladder, make sure that it is completely opened and that the sides are firmly locked. Have someone hold the ladder while you are using   it. Do not climb a closed stepladder.  Add color or contrast paint or tape to grab bars and handrails in your home. Place contrasting color strips on the first and last steps.  Use mobility aids as needed, such as canes, walkers, scooters, and crutches.  Turn on lights if it is dark. Replace any light bulbs that burn out.  Set up furniture so that there are clear paths. Keep the furniture in the same spot.  Fix any uneven floor surfaces.  Choose a carpet design that does not hide the edge of steps of a stairway.  Be aware of any and all pets.  Review your medicines with your healthcare provider. Some medicines can cause dizziness or changes in blood pressure, which increase your risk of falling. Talk with  your health care provider about other ways that you can decrease your risk of falls. This may include working with a physical therapist or trainer to improve your strength, balance, and endurance. ++++++++++++++++++++++++++++++++++++  It is important to avoid accidents which may result in broken bones.  Here are a few ideas on how to make your home safer so you will be less likely to trip or fall.  1. Use nonskid mats or non slip strips in your shower or tub, on your bathroom floor and around sinks.  If you know that you have spilled water, wipe it up! 2. In the bathroom, it is important to have properly installed grab bars on the walls or on the edge of the tub.  Towel racks are NOT strong enough for you to hold onto or to pull on for support. 3. Stairs and hallways should have enough light.  Add lamps or night lights if you need ore light. 4. It is good to have handrails on both sides of the stairs if possible.  Always fix broken handrails right away. 5. It is important to see the edges of steps.  Paint the edges of outdoor steps white so you can see them better.  Put colored tape on the edge of inside steps. 6. Throw-rugs are dangerous because they can slide.  Removing the rugs is the best idea, but if they must stay, add adhesive carpet tape to prevent slipping. 7. Do not keep things on stairs or in the halls.  Remove small furniture that blocks the halls as it may cause you to trip.  Keep telephone and electrical cords out of the way where you walk. 8. Always were sturdy, rubber-soled shoes for good support.  Never wear just socks, especially on the stairs.  Socks may cause you to slip or fall.  Do not wear full-length housecoats as you can easily trip on the bottom.  9. Place the things you use the most on the shelves that are the easiest to reach.  If you use a stepstool, make sure it is in good condition.  If you feel unsteady, DO NOT climb, ask for help. 10. If a health professional advises  you to use a cane or walker, do not be ashamed.  These items can keep you from falling and breaking your bones.

## 2018-10-14 NOTE — Progress Notes (Signed)
Subjective:     Patient ID: Alexandra Lindsey, female   DOB: May 19, 1941, 77 y.o.   MRN: [id number]  HPI    This very nice 77 yo MWF with Parkinson's Disease s/p multiple Lumbar surgeries & chronic pain presents acutely today for evaluation after a fall about 2 weeks ago and she's been ambulating & had persisting pains in her left hip are. As she just showed up at the front desk , she was sent to [facility] hospital for Lt hip Xray's which fortunately found no Fx's. Both she & her husband endorse that she's had multiple falls despite inconsistently using a rolling walker & a rollator. Today she presents in a wheelchair.  She is followed by Dr [name] for her Parkinson's Dz.   Medication Sig  . acetaminophen (TYLENOL) 500 MG tablet Take 1,000 mg by mouth 2 (two) times daily as needed for moderate pain or headache.  . albuterol (PROVENTIL HFA;VENTOLIN HFA) 108 (90 Base) MCG/ACT inhaler Inhale 1-2 puffs into the lungs every 6 (six) hours as needed for wheezing or shortness of breath.  [name] alendronate (FOSAMAX) 70 MG tablet Take 1 tablet per week with a full glass of water on an empty stomach for 60 minutes for Osteoporosis.  . AMBULATORY NON FORMULARY MEDICATION Lift chair Dx: G20  . carbidopa-levodopa (SINEMET IR) 25-100 MG tablet TAKE 3 TABLETS AT 5AM, TAKE 2 TABS AT 9AM, TAKE 1 TAB AT 1PM, TAKE 2 TABS AT 5PM, TAKE 1 TAB AT NIGHT, AND TAKE EXTRATAB AS NEEDED  . Cholecalciferol (VITAMIN D3) 5000 units CAPS Take 5,000 Units by mouth 2 (two) times daily.  [name] gabapentin (NEURONTIN) 100 MG capsule Take 1 capsule 2 to 3 x/ day for pain  . gabapentin (NEURONTIN) 600 MG tablet Take 1/2 to 1 tablet 3 to 4 x / day for Chronic & Acute Pain (Patient taking differently: Take 300 mg by mouth at bedtime. )  . hydrocortisone cream 1 % Apply 1 application topically daily as needed for itching.  . IBUPROFEN PO Take by mouth as needed.  . polyethylene glycol (MIRALAX / GLYCOLAX) packet Take 17 g by mouth daily.  . pramipexole  (MIRAPEX) 0.5 MG tablet Take 1 tablet (0.5 mg total) by mouth 3 (three) times daily.  . ranitidine (ZANTAC) 150 MG tablet Take 150 mg by mouth daily as needed for heartburn.   . sertraline (ZOLOFT) 100 MG tablet Take 1/2 to 1 tablet daily for Mood & Chronic Pain  . naproxen sodium (ALEVE) 220 MG tablet Take 220 mg by mouth daily.   Allergies  Allergen Reactions  . Macrobid [Nitrofurantoin Macrocrystal] Swelling and Rash  . Baclofen Other (See Comments)    Kept her awake, affected memory  . Cephalosporins Other (See Comments)    Cannot sleep  . Codeine Nausea Only    dizziness  . Darvocet [Propoxyphene N-Acetaminophen] Nausea And Vomiting  . Diclofenac Rash    The gel caused a rash  . Levaquin [Levofloxacin] Hives  . Penicillins Rash  . Selegiline Hcl Rash  . Tramadol Nausea And Vomiting  . Triamcinolone Itching   Past Medical History:  Diagnosis Date  . Anemia    as young girl and teenager  . Arthritis   . Chest pain, unspecified   . Constipation   . Costochondritis   . GERD (gastroesophageal reflux disease)   . Headache    ocular migraines  . History of hiatal hernia   . Hyperlipidemia 10-07-12  . Never smoked tobacco   . Parkinson's  disease ([place]) 10-07-12  . Parkinson's disease ([place])   . Prediabetes   . 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 Spinal stenosis    Past Surgical History:  Procedure Laterality Date  . ABDOMINAL HYSTERECTOMY    . APPENDECTOMY    . BACK SURGERY  12/20/2015  . BACK SURGERY  09/27/2017  . BUNIONECTOMY Bilateral 11-11-3  . CARDIAC CATHETERIZATION Bilateral 2013  . COLONOSCOPY    . HERNIA REPAIR    . LEFT HEART CATHETERIZATION WITH CORONARY ANGIOGRAM N/A 07/03/2012   Procedure: LEFT HEART CATHETERIZATION WITH CORONARY ANGIOGRAM;  Surgeon: Troy Sine, MD;  Location: [facility] CATH LAB;  Service: Cardiovascular;  Laterality: N/A;  . REPAIR OF CEREBROSPINAL FLUID LEAK N/A 12/24/2015   Procedure: LUMBAR WOUND EXPLORATION, REPAIR OF  CEREBROSPINAL FLUID LEAK, PLACEMENT OF LUMBAR DRAIN;  Surgeon: Kevan Ny Ditty, MD;  Location: [facility] NEURO ORS;  Service: Neurosurgery;  Laterality: N/A;  . ROTATOR CUFF REPAIR Left 10-07-12   Review of Systems    10 point systems review negative except as above.    Objective:   Physical Exam  BP 120/68   Pulse 70   Temp (!) 97.5 F (36.4 C)   Ht 5' (1.524 m)   Wt 135 lb (61.2 kg)   SpO2 97%   BMI 26.37 kg/m   HEENT - WNL. Neck - supple.  Chest - Clear equal BS. Cor - Nl HS. RRR w/o sig. PP 1(+). No edema. MS- FROM w/o deformities.  Int & Ext Hip rotation is unrestricted.  No tremor today. Sl Cog wheeling. In wheelchair. (+) tender over Lt Gr Trochanteric & ischial tuberosities.  Neuro -  Nl w/o focal abnormalities.    Assessment:     1. Contusion of left hip, initial encounter  - DG HIP UNILAT WITH PELVIS 2-3 VIEWS LEFT; Future  2. Depression, unspecified depression type  - sertraline (ZOLOFT) 100 MG tablet; Take 1/2 to 1 tablet daily for Mood & Chronic Pain  Dispense: 90 tablet; Refill: 3  3. Trochanteric bursitis of left hip  - predniSONE 20 MG tablet; 1 tab 3 x day for 3 days, then 1 tab 2 x day for 3 days, then 1 tab 1 x day for 5 days  Dispense: 20 tablet; Refill: 0  4. Ischial bursitis of left side  - predniSONE  20 MG tablet; 1 tab 3 x day for 3 days, then 1 tab 2 x day for 3 days, then 1 tab 1 x day for 5 days  Dispense: 20 tablet; Refill: 0  5. Unstable Gait   - Fall prevention discussed with patient & husband  - Patient given a Rx for a portable Wheel chair and advised not to attempt to walk independently and only with husband or other at her side

## 2018-10-16 DIAGNOSIS — M5416 Radiculopathy, lumbar region: Secondary | ICD-10-CM | POA: Diagnosis not present

## 2018-10-16 DIAGNOSIS — Z79891 Long term (current) use of opiate analgesic: Secondary | ICD-10-CM | POA: Diagnosis not present

## 2018-10-16 DIAGNOSIS — M792 Neuralgia and neuritis, unspecified: Secondary | ICD-10-CM | POA: Diagnosis not present

## 2018-10-16 DIAGNOSIS — M961 Postlaminectomy syndrome, not elsewhere classified: Secondary | ICD-10-CM | POA: Diagnosis not present

## 2018-10-21 DIAGNOSIS — G20 Parkinson's disease: Secondary | ICD-10-CM | POA: Diagnosis not present

## 2018-10-21 DIAGNOSIS — Z9181 History of falling: Secondary | ICD-10-CM | POA: Diagnosis not present

## 2018-10-21 DIAGNOSIS — R296 Repeated falls: Secondary | ICD-10-CM | POA: Diagnosis not present

## 2018-10-21 DIAGNOSIS — M549 Dorsalgia, unspecified: Secondary | ICD-10-CM | POA: Diagnosis not present

## 2018-10-28 DIAGNOSIS — G20 Parkinson's disease: Secondary | ICD-10-CM | POA: Diagnosis not present

## 2018-10-28 DIAGNOSIS — Z9181 History of falling: Secondary | ICD-10-CM | POA: Diagnosis not present

## 2018-10-28 DIAGNOSIS — M549 Dorsalgia, unspecified: Secondary | ICD-10-CM | POA: Diagnosis not present

## 2018-10-28 DIAGNOSIS — R296 Repeated falls: Secondary | ICD-10-CM | POA: Diagnosis not present

## 2018-10-31 ENCOUNTER — Other Ambulatory Visit: Payer: Self-pay | Admitting: *Deleted

## 2018-10-31 DIAGNOSIS — M7072 Other bursitis of hip, left hip: Secondary | ICD-10-CM

## 2018-10-31 DIAGNOSIS — M7062 Trochanteric bursitis, left hip: Secondary | ICD-10-CM

## 2018-10-31 MED ORDER — PREDNISONE 20 MG PO TABS: ORAL_TABLET | ORAL | 0 refills | Status: DC

## 2018-11-04 DIAGNOSIS — R296 Repeated falls: Secondary | ICD-10-CM | POA: Diagnosis not present

## 2018-11-04 DIAGNOSIS — G20 Parkinson's disease: Secondary | ICD-10-CM | POA: Diagnosis not present

## 2018-11-04 DIAGNOSIS — Z9181 History of falling: Secondary | ICD-10-CM | POA: Diagnosis not present

## 2018-11-04 DIAGNOSIS — M549 Dorsalgia, unspecified: Secondary | ICD-10-CM | POA: Diagnosis not present

## 2018-11-13 DIAGNOSIS — M961 Postlaminectomy syndrome, not elsewhere classified: Secondary | ICD-10-CM | POA: Diagnosis not present

## 2018-11-13 DIAGNOSIS — M5416 Radiculopathy, lumbar region: Secondary | ICD-10-CM | POA: Diagnosis not present

## 2018-11-13 DIAGNOSIS — M533 Sacrococcygeal disorders, not elsewhere classified: Secondary | ICD-10-CM | POA: Diagnosis not present

## 2018-11-13 DIAGNOSIS — G894 Chronic pain syndrome: Secondary | ICD-10-CM | POA: Diagnosis not present

## 2018-11-18 DIAGNOSIS — R296 Repeated falls: Secondary | ICD-10-CM | POA: Diagnosis not present

## 2018-11-18 DIAGNOSIS — G20 Parkinson's disease: Secondary | ICD-10-CM | POA: Diagnosis not present

## 2018-11-18 DIAGNOSIS — M549 Dorsalgia, unspecified: Secondary | ICD-10-CM | POA: Diagnosis not present

## 2018-11-18 DIAGNOSIS — Z9181 History of falling: Secondary | ICD-10-CM | POA: Diagnosis not present

## 2018-11-25 DIAGNOSIS — G20 Parkinson's disease: Secondary | ICD-10-CM | POA: Diagnosis not present

## 2018-11-25 DIAGNOSIS — M549 Dorsalgia, unspecified: Secondary | ICD-10-CM | POA: Diagnosis not present

## 2018-11-25 DIAGNOSIS — R296 Repeated falls: Secondary | ICD-10-CM | POA: Diagnosis not present

## 2018-11-25 DIAGNOSIS — Z9181 History of falling: Secondary | ICD-10-CM | POA: Diagnosis not present

## 2018-11-27 HISTORY — PX: BACK SURGERY: SHX140

## 2018-11-28 DIAGNOSIS — M549 Dorsalgia, unspecified: Secondary | ICD-10-CM | POA: Diagnosis not present

## 2018-11-28 DIAGNOSIS — Z9181 History of falling: Secondary | ICD-10-CM | POA: Diagnosis not present

## 2018-11-28 DIAGNOSIS — R296 Repeated falls: Secondary | ICD-10-CM | POA: Diagnosis not present

## 2018-11-28 DIAGNOSIS — G20 Parkinson's disease: Secondary | ICD-10-CM | POA: Diagnosis not present

## 2018-12-02 DIAGNOSIS — Z9181 History of falling: Secondary | ICD-10-CM | POA: Diagnosis not present

## 2018-12-02 DIAGNOSIS — G20 Parkinson's disease: Secondary | ICD-10-CM | POA: Diagnosis not present

## 2018-12-02 DIAGNOSIS — R296 Repeated falls: Secondary | ICD-10-CM | POA: Diagnosis not present

## 2018-12-02 DIAGNOSIS — M549 Dorsalgia, unspecified: Secondary | ICD-10-CM | POA: Diagnosis not present

## 2018-12-09 DIAGNOSIS — R296 Repeated falls: Secondary | ICD-10-CM | POA: Diagnosis not present

## 2018-12-09 DIAGNOSIS — G20 Parkinson's disease: Secondary | ICD-10-CM | POA: Diagnosis not present

## 2018-12-09 DIAGNOSIS — M549 Dorsalgia, unspecified: Secondary | ICD-10-CM | POA: Diagnosis not present

## 2018-12-09 DIAGNOSIS — Z9181 History of falling: Secondary | ICD-10-CM | POA: Diagnosis not present

## 2018-12-12 DIAGNOSIS — M533 Sacrococcygeal disorders, not elsewhere classified: Secondary | ICD-10-CM | POA: Diagnosis not present

## 2018-12-16 DIAGNOSIS — Z9181 History of falling: Secondary | ICD-10-CM | POA: Diagnosis not present

## 2018-12-16 DIAGNOSIS — R296 Repeated falls: Secondary | ICD-10-CM | POA: Diagnosis not present

## 2018-12-16 DIAGNOSIS — M549 Dorsalgia, unspecified: Secondary | ICD-10-CM | POA: Diagnosis not present

## 2018-12-16 DIAGNOSIS — G20 Parkinson's disease: Secondary | ICD-10-CM | POA: Diagnosis not present

## 2018-12-23 DIAGNOSIS — G20 Parkinson's disease: Secondary | ICD-10-CM | POA: Diagnosis not present

## 2018-12-23 DIAGNOSIS — Z9181 History of falling: Secondary | ICD-10-CM | POA: Diagnosis not present

## 2018-12-23 DIAGNOSIS — M549 Dorsalgia, unspecified: Secondary | ICD-10-CM | POA: Diagnosis not present

## 2018-12-23 DIAGNOSIS — R296 Repeated falls: Secondary | ICD-10-CM | POA: Diagnosis not present

## 2018-12-27 ENCOUNTER — Other Ambulatory Visit: Payer: Self-pay | Admitting: Internal Medicine

## 2018-12-27 DIAGNOSIS — M25552 Pain in left hip: Secondary | ICD-10-CM

## 2018-12-28 DIAGNOSIS — R296 Repeated falls: Secondary | ICD-10-CM | POA: Diagnosis not present

## 2018-12-28 DIAGNOSIS — M549 Dorsalgia, unspecified: Secondary | ICD-10-CM | POA: Diagnosis not present

## 2018-12-28 DIAGNOSIS — Z9181 History of falling: Secondary | ICD-10-CM | POA: Diagnosis not present

## 2018-12-28 DIAGNOSIS — G20 Parkinson's disease: Secondary | ICD-10-CM | POA: Diagnosis not present

## 2018-12-30 DIAGNOSIS — M549 Dorsalgia, unspecified: Secondary | ICD-10-CM | POA: Diagnosis not present

## 2018-12-30 DIAGNOSIS — G20 Parkinson's disease: Secondary | ICD-10-CM | POA: Diagnosis not present

## 2018-12-30 DIAGNOSIS — Z9181 History of falling: Secondary | ICD-10-CM | POA: Diagnosis not present

## 2018-12-30 DIAGNOSIS — R296 Repeated falls: Secondary | ICD-10-CM | POA: Diagnosis not present

## 2019-01-01 DIAGNOSIS — M25552 Pain in left hip: Secondary | ICD-10-CM | POA: Diagnosis not present

## 2019-01-02 DIAGNOSIS — M792 Neuralgia and neuritis, unspecified: Secondary | ICD-10-CM | POA: Diagnosis not present

## 2019-01-02 DIAGNOSIS — Z79891 Long term (current) use of opiate analgesic: Secondary | ICD-10-CM | POA: Diagnosis not present

## 2019-01-02 DIAGNOSIS — M961 Postlaminectomy syndrome, not elsewhere classified: Secondary | ICD-10-CM | POA: Diagnosis not present

## 2019-01-02 DIAGNOSIS — G894 Chronic pain syndrome: Secondary | ICD-10-CM | POA: Diagnosis not present

## 2019-01-06 DIAGNOSIS — G20 Parkinson's disease: Secondary | ICD-10-CM | POA: Diagnosis not present

## 2019-01-06 DIAGNOSIS — R296 Repeated falls: Secondary | ICD-10-CM | POA: Diagnosis not present

## 2019-01-06 DIAGNOSIS — Z9181 History of falling: Secondary | ICD-10-CM | POA: Diagnosis not present

## 2019-01-06 DIAGNOSIS — M549 Dorsalgia, unspecified: Secondary | ICD-10-CM | POA: Diagnosis not present

## 2019-01-10 DIAGNOSIS — M4805 Spinal stenosis, thoracolumbar region: Secondary | ICD-10-CM | POA: Diagnosis not present

## 2019-01-10 DIAGNOSIS — M5125 Other intervertebral disc displacement, thoracolumbar region: Secondary | ICD-10-CM | POA: Diagnosis not present

## 2019-01-10 DIAGNOSIS — M961 Postlaminectomy syndrome, not elsewhere classified: Secondary | ICD-10-CM | POA: Diagnosis not present

## 2019-01-10 DIAGNOSIS — T84038A Mechanical loosening of other internal prosthetic joint, initial encounter: Secondary | ICD-10-CM | POA: Diagnosis not present

## 2019-01-13 ENCOUNTER — Telehealth: Payer: Self-pay | Admitting: Neurology

## 2019-01-13 DIAGNOSIS — M549 Dorsalgia, unspecified: Secondary | ICD-10-CM | POA: Diagnosis not present

## 2019-01-13 DIAGNOSIS — R296 Repeated falls: Secondary | ICD-10-CM | POA: Diagnosis not present

## 2019-01-13 DIAGNOSIS — G20 Parkinson's disease: Secondary | ICD-10-CM | POA: Diagnosis not present

## 2019-01-13 DIAGNOSIS — Z9181 History of falling: Secondary | ICD-10-CM | POA: Diagnosis not present

## 2019-01-13 NOTE — Telephone Encounter (Signed)
Left message on machine for patient to call back.

## 2019-01-13 NOTE — Telephone Encounter (Signed)
Patient wanted to discuss back pain stimulator with Dr. Carles Collet. She is scheduled for Neuropsych assessment for the pre-op work up, but doesn't want to proceed if Dr. Carles Collet thinks it will negatively affect her PD.  I have placed patient on the wait list. She has an appt on 02/07/2019.

## 2019-01-13 NOTE — Telephone Encounter (Signed)
Patient is needing some advice. She said she is a parkinsons patient and needing to speak with you. Please call her back. Thanks!

## 2019-01-14 ENCOUNTER — Other Ambulatory Visit: Payer: Self-pay | Admitting: Family Medicine

## 2019-01-14 DIAGNOSIS — M7072 Other bursitis of hip, left hip: Secondary | ICD-10-CM

## 2019-01-14 DIAGNOSIS — M25552 Pain in left hip: Secondary | ICD-10-CM

## 2019-01-16 ENCOUNTER — Other Ambulatory Visit: Payer: Self-pay | Admitting: Family Medicine

## 2019-01-16 ENCOUNTER — Ambulatory Visit
Admission: RE | Admit: 2019-01-16 | Discharge: 2019-01-16 | Disposition: A | Discharge status: Home / Self Care | Payer: Medicare Other | Source: Ambulatory Visit | Attending: Family Medicine | Admitting: Family Medicine

## 2019-01-16 ENCOUNTER — Other Ambulatory Visit: Payer: Self-pay | Admitting: Neurology

## 2019-01-16 DIAGNOSIS — M25552 Pain in left hip: Secondary | ICD-10-CM | POA: Diagnosis not present

## 2019-01-18 ENCOUNTER — Other Ambulatory Visit: Payer: Medicare Other

## 2019-01-20 DIAGNOSIS — G20 Parkinson's disease: Secondary | ICD-10-CM | POA: Diagnosis not present

## 2019-01-20 DIAGNOSIS — Z9181 History of falling: Secondary | ICD-10-CM | POA: Diagnosis not present

## 2019-01-20 DIAGNOSIS — M549 Dorsalgia, unspecified: Secondary | ICD-10-CM | POA: Diagnosis not present

## 2019-01-20 DIAGNOSIS — R296 Repeated falls: Secondary | ICD-10-CM | POA: Diagnosis not present

## 2019-01-23 DIAGNOSIS — M25552 Pain in left hip: Secondary | ICD-10-CM | POA: Diagnosis not present

## 2019-01-23 DIAGNOSIS — M7062 Trochanteric bursitis, left hip: Secondary | ICD-10-CM | POA: Diagnosis not present

## 2019-01-26 DIAGNOSIS — M549 Dorsalgia, unspecified: Secondary | ICD-10-CM | POA: Diagnosis not present

## 2019-01-26 DIAGNOSIS — Z9181 History of falling: Secondary | ICD-10-CM | POA: Diagnosis not present

## 2019-01-26 DIAGNOSIS — G20 Parkinson's disease: Secondary | ICD-10-CM | POA: Diagnosis not present

## 2019-01-26 DIAGNOSIS — R296 Repeated falls: Secondary | ICD-10-CM | POA: Diagnosis not present

## 2019-01-27 ENCOUNTER — Telehealth: Payer: Self-pay | Admitting: Neurology

## 2019-01-27 DIAGNOSIS — Z9181 History of falling: Secondary | ICD-10-CM | POA: Diagnosis not present

## 2019-01-27 DIAGNOSIS — G20 Parkinson's disease: Secondary | ICD-10-CM | POA: Diagnosis not present

## 2019-01-27 DIAGNOSIS — R296 Repeated falls: Secondary | ICD-10-CM | POA: Diagnosis not present

## 2019-01-27 DIAGNOSIS — M549 Dorsalgia, unspecified: Secondary | ICD-10-CM | POA: Diagnosis not present

## 2019-01-27 NOTE — Telephone Encounter (Signed)
Patient made aware of advise from Dr. Carles Collet. Will keep appt on 02/07/2019.

## 2019-01-27 NOTE — Telephone Encounter (Signed)
Patient wanted your opinion on her getting a back stimulator. I had spoken with her on 01/13/2019 and advised that you could discuss this at her appointment on 02/07/2019. She is now calling back again. Please advise.

## 2019-01-27 NOTE — Telephone Encounter (Signed)
I have no objection from a parkinson's standpoint, except that most of these are not MRI compatible so she should ask about that aspect in case she needs one in the future for other reasons

## 2019-01-27 NOTE — Telephone Encounter (Signed)
Patient is calling in about her back and parkinson's. Wants an opinion on a stimulator. Please call her back at [phone]. Thanks!

## 2019-01-28 DIAGNOSIS — T84296S Other mechanical complication of internal fixation device of vertebrae, sequela: Secondary | ICD-10-CM | POA: Diagnosis not present

## 2019-01-28 DIAGNOSIS — M810 Age-related osteoporosis without current pathological fracture: Secondary | ICD-10-CM | POA: Diagnosis not present

## 2019-01-28 DIAGNOSIS — M960 Pseudarthrosis after fusion or arthrodesis: Secondary | ICD-10-CM | POA: Diagnosis not present

## 2019-01-28 DIAGNOSIS — M961 Postlaminectomy syndrome, not elsewhere classified: Secondary | ICD-10-CM | POA: Diagnosis not present

## 2019-02-03 ENCOUNTER — Other Ambulatory Visit: Payer: Self-pay | Admitting: Neurology

## 2019-02-03 DIAGNOSIS — G20 Parkinson's disease: Secondary | ICD-10-CM | POA: Diagnosis not present

## 2019-02-03 DIAGNOSIS — M549 Dorsalgia, unspecified: Secondary | ICD-10-CM | POA: Diagnosis not present

## 2019-02-03 DIAGNOSIS — Z9181 History of falling: Secondary | ICD-10-CM | POA: Diagnosis not present

## 2019-02-03 DIAGNOSIS — R296 Repeated falls: Secondary | ICD-10-CM | POA: Diagnosis not present

## 2019-02-04 ENCOUNTER — Encounter: Payer: Self-pay | Admitting: Internal Medicine

## 2019-02-05 NOTE — Progress Notes (Signed)
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 Not on file    Attends meetings of clubs or organizations: Not on file    Relationship status: Not on file   Intimate partner violence:    Fear of current or ex partner: Not on file    Emotionally abused: Not on file    Physically abused: Not on file    Forced sexual activity: Not on file  Other Topics Concern   Not on file  Social  History Narrative   Not on file    FAMILY HISTORY:   Family Status  Relation Name Status   Mother  Deceased       esophageal cancer, heart attack   Father  Deceased       suicide   Brother  Deceased       suicide   Brother  Deceased       HTN, hypercholesterolemia   Brother  Alive       mental retardation    ROS:    Review of Systems  Constitutional: Negative.   HENT: Negative.   Eyes: Negative.   Cardiovascular: Negative.   Gastrointestinal: Negative.   Genitourinary: Negative.   Musculoskeletal: Positive for falls.  Skin: Negative.      PHYSICAL EXAMINATION:    VITALS:   Vitals:   02/07/19 0828  BP: (!) 96/48  Pulse: 84  Temp: 98.1 F (36.7 C)  TempSrc: Oral  SpO2: 94%  Weight: 136 lb (61.7  kg)  Height: 5' (1.524 m)     GEN:  The patient appears stated age and is in NAD. HEENT:  Normocephalic.  There is a bruise on the L cheek (husband states that it is from November).  The mucous membranes are moist. The superficial temporal arteries are without ropiness or tenderness. CV:  RRR Lungs:  CTAB Neck/HEME:  There are no carotid bruits bilaterally.  Neurological examination:  Orientation: The patient is alert and oriented x3.  Cranial nerves: There is good facial symmetry.  No significant facial hypomimia.   Extraocular muscles are intact.  There are no square wave jerks.  The visual fields are full to confrontational testing. The speech is fluent and clear. Soft palate rises symmetrically and there is no tongue deviation. Hearing is intact to conversational tone. Sensation: Sensation is intact to light touch throughout. Motor: Strength is 5/5 in the bilateral upper and lower extremities.   Shoulder shrug is equal and symmetric.  There is no pronator drift.  Movement examination: Tone: There is normal tone in the UE/LE Abnormal movements: there is mild dyskinesia in the trunk Coordination:  There is decremation with finger taps bilaterally Gait and Station: did not ambulate the patient today.  She did try to stand up on her own and was very unbalanced.  ASSESSMENT/PLAN:  1.  Idiopathic PD.    -continue Carbidopa Levodopa as follows: 3 tablets at 5:00 am, 2 tablets at 9:00 am, 1 tablet at 1:00 pm, 2 tablets at 5:00 pm and one at night.  -For now, continue pramipexole 0.5 mg 3 times per day.  I would like to get her off of that altogether because of hallucinations and daytime hypersomnolence, but decided to hold on changing that further because she has so many balance changes because of back pain and I worry about worsening parkinsonism causing further balance change.  -Long discussion with the patient and her husband today.  I told her in no uncertain terms that she should not be  walking at all, and with her husband was directly with her.  She has had far too many falls (8 this week).  Discussed potential consequences of future falls.  -Prescription for lift chair was given.  She lost her prior one 2.  Urinary incontinence and frequency  -doing better in this regard 3.  Back pain  -I think that the large majority of her falls are actually due to back pain, as falls did not start until back surgery about 2 years ago.  Unfortunately, she is no longer a candidate for future back surgery.  She is working on potentially getting a spinal cord stimulator. 4.  RBD.  -  She did not want to take the clonazepam, primarily because of its addictive properties.  She did try it for a short period of time.  She reports that she is screaming out some, but has not had any falling out of the bed. 5.  Orthostatic Hypotension  -We discussed the nature and pathophysiology of orthostatic hypotension.  We discussed that this is very common with parkinsonism and discussed the fact that often times we have to except higher blood pressures then would be acceptable in the general population in order to accommodate the low blood pressures that occur when patients stand up.  We talked about the importance of proper hydration and what that means.  We talked about non-medicinal means of treating orthostatic hypotension, including compression stockings and abdominal compression binder.  We talked about medications and the risk of supine hypertension.  She would like to try midodrine.  We will start 10 mg twice per day.  She is to keep a log of her blood pressures.  She is to elevate the head of the bed. 6.  Much greater than 50% of this visit was spent in counseling and coordinating care.  Total face to face time:  30 min

## 2019-02-07 ENCOUNTER — Encounter: Payer: Self-pay | Admitting: Neurology

## 2019-02-07 ENCOUNTER — Other Ambulatory Visit: Payer: Self-pay

## 2019-02-07 ENCOUNTER — Ambulatory Visit (INDEPENDENT_AMBULATORY_CARE_PROVIDER_SITE_OTHER): Payer: Medicare Other | Admitting: Neurology

## 2019-02-07 ENCOUNTER — Encounter

## 2019-02-07 VITALS — BP 120/70 | HR 84 | Temp 98.1°F | Ht 60.0 in | Wt 136.0 lb

## 2019-02-07 DIAGNOSIS — G20 Parkinson's disease: Secondary | ICD-10-CM

## 2019-02-07 DIAGNOSIS — G249 Dystonia, unspecified: Secondary | ICD-10-CM

## 2019-02-07 DIAGNOSIS — G903 Multi-system degeneration of the autonomic nervous system: Secondary | ICD-10-CM | POA: Diagnosis not present

## 2019-02-07 MED ORDER — MIDODRINE HCL 10 MG PO TABS: 10.0000 mg | ORAL_TABLET | Freq: Two times a day (BID) | ORAL | 1 refills | Status: DC

## 2019-02-07 MED ORDER — AMBULATORY NON FORMULARY MEDICATION: 0 refills | Status: DC

## 2019-02-07 NOTE — Patient Instructions (Signed)
Start midodrine - 10 mg twice per day Keep an eye on your blood pressure Stay seated!!!  No walking without someone directly with you! Keep head of bed elevated!  The physicians and staff at [facility] are committed to providing excellent care. You may receive a survey requesting feedback about your experience at our office. We strive to receive "very good" responses to the survey questions. If you feel that your experience would prevent you from giving the office a "very good " response, please contact our office to try to remedy the situation. We may be reached at [phone]. Thank you for taking the time out of your busy day to complete the survey.

## 2019-02-10 ENCOUNTER — Telehealth: Payer: Self-pay | Admitting: Neurology

## 2019-02-10 DIAGNOSIS — M549 Dorsalgia, unspecified: Secondary | ICD-10-CM | POA: Diagnosis not present

## 2019-02-10 DIAGNOSIS — Z9181 History of falling: Secondary | ICD-10-CM | POA: Diagnosis not present

## 2019-02-10 DIAGNOSIS — G20 Parkinson's disease: Secondary | ICD-10-CM | POA: Diagnosis not present

## 2019-02-10 DIAGNOSIS — R296 Repeated falls: Secondary | ICD-10-CM | POA: Diagnosis not present

## 2019-02-10 NOTE — Telephone Encounter (Signed)
Recvd vm from [place] with [facility] letting us know patient had 3 falls over the last week. No injuries.

## 2019-02-12 DIAGNOSIS — F4542 Pain disorder with related psychological factors: Secondary | ICD-10-CM | POA: Diagnosis not present

## 2019-02-12 DIAGNOSIS — M961 Postlaminectomy syndrome, not elsewhere classified: Secondary | ICD-10-CM | POA: Diagnosis not present

## 2019-02-17 ENCOUNTER — Telehealth: Payer: Self-pay | Admitting: Neurology

## 2019-02-17 DIAGNOSIS — Z9181 History of falling: Secondary | ICD-10-CM | POA: Diagnosis not present

## 2019-02-17 DIAGNOSIS — G20 Parkinson's disease: Secondary | ICD-10-CM | POA: Diagnosis not present

## 2019-02-17 DIAGNOSIS — R296 Repeated falls: Secondary | ICD-10-CM | POA: Diagnosis not present

## 2019-02-17 DIAGNOSIS — M549 Dorsalgia, unspecified: Secondary | ICD-10-CM | POA: Diagnosis not present

## 2019-02-17 NOTE — Telephone Encounter (Signed)
Gretchen, PT with [facility], called to report that patient having low blood pressure today. (80/58, 80/50).   Patient told her she had one high reading on last Tuesday (170/95) and stopped her Midodrine. She had kept a log prior to that and blood pressures were running fine (106/52, 124/68, etc). She also had a fall yesterday AM. No injuries. No loss of consciousness.   I advised patient to restart Midodrine. Dr. Carles Collet - please advise.

## 2019-02-17 NOTE — Telephone Encounter (Signed)
Patient did it on her own. Called patient and reiterated to restart and stay on Midodrine. That high blood pressure is better than low, but to keep a log and to take twice daily or when symptomatic. To let us know if persistent high blood pressures. She agrees with this plan.

## 2019-02-17 NOTE — Telephone Encounter (Signed)
Who recommended the stop of the midodrine or did patient do on her own?  An isolated BP in the 170's is okay.  Agree, she should stay on the midodrine

## 2019-02-20 ENCOUNTER — Telehealth: Payer: Self-pay | Admitting: Neurology

## 2019-02-20 DIAGNOSIS — S7002XA Contusion of left hip, initial encounter: Secondary | ICD-10-CM | POA: Diagnosis not present

## 2019-02-20 NOTE — Telephone Encounter (Signed)
Patient needs to talk to someone about having no energy and having a hard time walking

## 2019-02-20 NOTE — Telephone Encounter (Signed)
Spoke with patient. She complained about worsening fatigue. I did let her know this may be due to her low blood pressures from stopping her medication that was recently prescribed to her. She does have some chronic fatigue but she states it was worse this week. I did advise she may need to give her body time to readjust. Her readings since restarting Midodrine have been: 129/61, 149/98, 112/57, and 133/68.  Regarding the trouble walking: she did have another fall this morning. When questioned whether she has her husband or other person with her at all times when walking she states, "I have my walker." I stressed again the importance of having someone with her at all times during walking and that if she didn't have someone with her that she should not walk. Made her aware of potential ramifications of continued falls.   Dr. Carles Collet - please advise of any other advise to give to patient.

## 2019-02-20 NOTE — Telephone Encounter (Signed)
Those BP readings are much, much better!  I can get some labs to make sure that things looks okay but she would have to go out.  If she feels okay with that, CBC, UA, chem.  And she should NOT be walking without someone directly with her.  Otherwise, needs to stay seated at all times.

## 2019-02-20 NOTE — Telephone Encounter (Signed)
Left message on machine for patient to call back.

## 2019-02-20 NOTE — Telephone Encounter (Signed)
Patient left a VM returning [name]'s call please call

## 2019-02-21 NOTE — Telephone Encounter (Signed)
Spoke with patient and offered to have labs drawn. She has an appointment with Neurosurgery next week and wants to see them first, then will let us know if she wants labs drawn.

## 2019-02-24 DIAGNOSIS — R296 Repeated falls: Secondary | ICD-10-CM | POA: Diagnosis not present

## 2019-02-24 DIAGNOSIS — M549 Dorsalgia, unspecified: Secondary | ICD-10-CM | POA: Diagnosis not present

## 2019-02-24 DIAGNOSIS — Z9181 History of falling: Secondary | ICD-10-CM | POA: Diagnosis not present

## 2019-02-24 DIAGNOSIS — G20 Parkinson's disease: Secondary | ICD-10-CM | POA: Diagnosis not present

## 2019-02-26 ENCOUNTER — Encounter: Payer: Self-pay | Admitting: Internal Medicine

## 2019-02-26 ENCOUNTER — Ambulatory Visit: Payer: Medicare Other | Admitting: Adult Health

## 2019-02-26 DIAGNOSIS — R296 Repeated falls: Secondary | ICD-10-CM | POA: Diagnosis not present

## 2019-02-26 DIAGNOSIS — G20 Parkinson's disease: Secondary | ICD-10-CM | POA: Diagnosis not present

## 2019-02-26 DIAGNOSIS — Z9181 History of falling: Secondary | ICD-10-CM | POA: Diagnosis not present

## 2019-02-26 DIAGNOSIS — M549 Dorsalgia, unspecified: Secondary | ICD-10-CM | POA: Diagnosis not present

## 2019-02-26 NOTE — Progress Notes (Signed)
THIS ENCOUNTER IS A VIRTUAL/TELEPHONE VISIT DUE TO COVID-19 - PATIENT WAS NOT SEEN IN THE OFFICE.  PATIENT HAS CONSENTED TO VIRTUAL VISIT / TELEMEDICINE VISIT  This provider placed a call to Alexandra Lindsey using telephone, her appointment was changed to a virtual office visit to reduce the risk of exposure to the COVID-19 virus and to help remain healthy and safe. The virtual visit will also provide continuity of care. She verbalizes understanding.    FOLLOW UP Assessment:    Essential hypertension - continue medications, DASH diet, exercise and monitor at home. Call if greater than 130/80.  GET A NEW CUFF RECHECK AT HOME OR NEED RECHECK HERE IN THE OFFICE STRICT ER PRECAUTIONS AND INFORMATION GIVEN TO THE PATIENT  Hyperlipidemia, mixed check lipids decrease fatty foods increase activity.   Parkinson's disease ([place]) Continue neuro follow up Dr. Carles Collet  Medication management  Lumbar stenosis with neurogenic claudication Continue ortho follow up, AND NEUROSURGEON, HOPE SHE CAN GET THE SPINAL STIMULATOR  Prediabetes Discussed disease progression and risks Discussed diet/exercise, weight management and risk modification  Gastroesophageal reflux disease, esophagitis presence not specified Continue PPI/H2 blocker, diet discussed  Over 30 minutes of exam, counseling, chart review, and critical decision making was performed  Future Appointments  Date Time Provider [place]  06/09/2019  8:45 AM [name], Eustace Quail, DO [facility] None     Subjective:  Alexandra Lindsey is a 78 y.o. female who presents for 3 month follow up for HTN, hyperlipidemia, prediabetes, and vitamin D Def.    Her blood pressure has been controlled at home, today their BP is BP: 92/63 in her left arm and 160/100 in right right arm, she is using a wrist cuff at home and her and her husband state that they do not trust it.  Heart cath 4-5 years ago for chest pain. Has big discrepancy in bilateral arms 92/63 and  160/100 in the right arm. No active chest pain, back pain, arm pain, AB pain, dizziness, SOB. No imaging history.  BP Readings from Last 3 Encounters:  02/27/19 92/63  02/07/19 120/70  10/14/18 120/68    She does workout. She denies chest pain, shortness of breath, dizziness.   She has been doing well with her carbidopa levodopa, has had some low BP and is on medication for that. .  She reports that she has had multiple falls.  She is using a walker.   She is seeing ortho doctor in [place] for her lower back pain.   She uses a bedside commode Dr. Mariea Clonts and suppose to see Dr. Nadara Mustard at [facility] neurosurgeon. She has pain down into her left buttocks, only does not hurt when she lays on the couch.  She is not driving, she can not walk up stairs well, she is suppose to try a spinal stimulator.     She is not on cholesterol medication and denies myalgias. Her cholesterol is at goal. The cholesterol last visit was:   Lab Results  Component Value Date   CHOL 204 (H) 08/01/2018   HDL 47 (L) 08/01/2018   LDLCALC 126 (H) 08/01/2018   TRIG 185 (H) 08/01/2018   CHOLHDL 4.3 08/01/2018   :  Lab Results  Component Value Date   HGBA1C 5.4 01/02/2018   Last GFR Lab Results  Component Value Date   GFRNONAA 52 (L) 08/01/2018     Lab Results  Component Value Date   GFRAA 60 08/01/2018   Patient is on Vitamin D supplement.  Lab Results  Component Value Date   VD25OH 35 01/02/2018       Medication Review: Current Outpatient Medications on File Prior to Visit  Medication Sig Dispense Refill  . acetaminophen (TYLENOL) 500 MG tablet Take 1,000 mg by mouth 2 (two) times daily as needed for moderate pain or headache.    . albuterol (PROVENTIL HFA;VENTOLIN HFA) 108 (90 Base) MCG/ACT inhaler Inhale 1-2 puffs into the lungs every 6 (six) hours as needed for wheezing or shortness of breath.    . AMBULATORY NON FORMULARY MEDICATION Lift chair Dx: G20 1 Device 0  . carbidopa-levodopa (SINEMET IR)  25-100 MG tablet 3 tablets at 5:00 am, 2 tablets at 9:00 am, 1 tablet at 1:00 pm, 2 tablets at 5:00 pm and 1 at night 300 tablet 1  . Cholecalciferol (VITAMIN D3) 5000 units CAPS Take 5,000 Units by mouth 2 (two) times daily.    . hydrocortisone cream 1 % Apply 1 application topically daily as needed for itching.    . IBUPROFEN PO Take by mouth as needed.    . midodrine (PROAMATINE) 10 MG tablet Take 1 tablet (10 mg total) by mouth 2 (two) times daily. 180 tablet 1  . polyethylene glycol (MIRALAX / GLYCOLAX) packet Take 17 g by mouth daily.    . pramipexole (MIRAPEX) 0.5 MG tablet TAKE 1 TABLET BY MOUTH THREE TIMES PER DAY 270 tablet 0  . pregabalin (LYRICA) 150 MG capsule Take by mouth.    . sertraline (ZOLOFT) 100 MG tablet Take 1/2 to 1 tablet daily for Mood & Chronic Pain 90 tablet 3   No current facility-administered medications on file prior to visit.     Allergies: Allergies  Allergen Reactions  . Macrobid [Nitrofurantoin Macrocrystal] Swelling and Rash  . Baclofen Other (See Comments)    Kept her awake, affected memory  . Cephalosporins Other (See Comments)    Cannot sleep  . Codeine Nausea Only    dizziness  . Darvocet [Propoxyphene N-Acetaminophen] Nausea And Vomiting  . Diclofenac Rash    The gel caused a rash  . Levaquin [Levofloxacin] Hives  . Penicillins Rash  . Selegiline Hcl Rash  . Tramadol Nausea And Vomiting  . Triamcinolone Itching    Current Problems (verified) has Parkinson's disease ([place]); RBD (REM behavioral disorder); Hyperlipidemia; Hypertension; GERD (gastroesophageal reflux disease); Abnormal glucose; Vitamin D deficiency; Medication management; SI (sacroiliac) joint dysfunction; Lumbar radiculopathy; Lumbar stenosis with neurogenic claudication; and Positive colorectal cancer screening using Cologuard test on their problem list. Surgical: She  has a past surgical history that includes Abdominal hysterectomy; Hernia repair; Rotator cuff repair (Left,  10-07-12); Bunionectomy (Bilateral, 11-11-3); Cardiac catheterization (Bilateral, 2013); left heart catheterization with coronary angiogram (N/A, 07/03/2012); Appendectomy; Colonoscopy; Back surgery (12/20/2015); Repair of cerebrospinal fluid leak (N/A, 12/24/2015); and Back surgery (09/27/2017). Family Her family history includes CVA in her brother; Esophageal cancer in her mother; Heart attack in her mother. Social history  She reports that she has never smoked. She has never used smokeless tobacco. She reports that she does not drink alcohol or use drugs.   Objective:   Today's Vitals   02/27/19 0910  BP: 92/63  Weight: 135 lb (61.2 kg)  Height: 5' (1.524 m)   Body mass index is 26.37 kg/m.  General Appearance:Well sounding, in no apparent distress.  ENT/Mouth: No hoarseness, No cough for duration of visit.  Respiratory: completing full sentences without distress, without audible wheeze Neuro: Awake and oriented X 3,  Psych:  Insight and Judgment  appropriate.     Vicie Mutters, PA-C   02/27/2019

## 2019-02-27 ENCOUNTER — Ambulatory Visit: Payer: Medicare Other | Admitting: Physician Assistant

## 2019-02-27 ENCOUNTER — Encounter: Payer: Self-pay | Admitting: Physician Assistant

## 2019-02-27 ENCOUNTER — Other Ambulatory Visit: Payer: Self-pay

## 2019-02-27 VITALS — BP 92/63 | Ht 60.0 in | Wt 135.0 lb

## 2019-02-27 DIAGNOSIS — I10 Essential (primary) hypertension: Secondary | ICD-10-CM

## 2019-02-27 DIAGNOSIS — E782 Mixed hyperlipidemia: Secondary | ICD-10-CM | POA: Diagnosis not present

## 2019-02-27 DIAGNOSIS — Z79899 Other long term (current) drug therapy: Secondary | ICD-10-CM | POA: Diagnosis not present

## 2019-02-27 DIAGNOSIS — R7309 Other abnormal glucose: Secondary | ICD-10-CM | POA: Diagnosis not present

## 2019-02-27 DIAGNOSIS — E559 Vitamin D deficiency, unspecified: Secondary | ICD-10-CM | POA: Diagnosis not present

## 2019-02-27 DIAGNOSIS — G20 Parkinson's disease: Secondary | ICD-10-CM

## 2019-02-27 NOTE — Patient Instructions (Addendum)
Get a new BP cuff  We may get imaging to look at your upper aorta and extremities due to the BP abnormality but would prefer for you to get a new BP cuff to check your BP OR you need to come in to get your blood pressure checked  Very rarely you have have a problem with the vessels leading to your arms like a blockage or you can have the big vessel coming from your heart have problems.  If you get any chest pain, back pain, shortness of breath, dizziness you should call 911 but otherwise without these symptoms I want to see if your BP cuff is wrong.    Get help right away if you:  Develop any symptoms of aortic dissection after treatment, including severe pain in your chest, back, or abdomen.  Have pain in your chest.  Have weakness in your arm or leg.  Have pain in your abdomen.  Have trouble breathing or you develop a cough.  Faint.  Develop a racing heartbeat (palpitations). These symptoms may represent a serious problem that is an emergency. Do not wait to see if the symptoms will go away. Get medical help right away. Call your local emergency services (911 in the [place] Do not drive yourself to the hospital. Summary  Aortic dissection happens when there is a tear in the wall of the body's main blood vessel (aorta). It is a medical emergency.  The most common symptom is severe pain in the chest or pain that spreads (radiates) to the back, neck, jaw, or abdomen.  It is important to treat aortic dissection as quickly as possible. Treatment usually includes medicines and surgery.  Take over-the-counter and prescription medicines only as told by your health care provider. This information is not intended to replace advice given to you by your health care provider. Make sure you discuss any questions you have with your health care provider. Document Released: 02/20/2008 Document Revised: 04/25/2018 Document Reviewed: 04/25/2018 Elsevier Interactive Patient Education  2019 [employer].

## 2019-03-04 DIAGNOSIS — G20 Parkinson's disease: Secondary | ICD-10-CM | POA: Diagnosis not present

## 2019-03-04 DIAGNOSIS — Z9181 History of falling: Secondary | ICD-10-CM | POA: Diagnosis not present

## 2019-03-04 DIAGNOSIS — R296 Repeated falls: Secondary | ICD-10-CM | POA: Diagnosis not present

## 2019-03-04 DIAGNOSIS — M549 Dorsalgia, unspecified: Secondary | ICD-10-CM | POA: Diagnosis not present

## 2019-03-06 DIAGNOSIS — M961 Postlaminectomy syndrome, not elsewhere classified: Secondary | ICD-10-CM | POA: Diagnosis not present

## 2019-03-06 DIAGNOSIS — G894 Chronic pain syndrome: Secondary | ICD-10-CM | POA: Diagnosis not present

## 2019-03-06 DIAGNOSIS — M5416 Radiculopathy, lumbar region: Secondary | ICD-10-CM | POA: Diagnosis not present

## 2019-03-10 ENCOUNTER — Telehealth: Payer: Self-pay | Admitting: Neurology

## 2019-03-10 DIAGNOSIS — Z9181 History of falling: Secondary | ICD-10-CM | POA: Diagnosis not present

## 2019-03-10 DIAGNOSIS — M549 Dorsalgia, unspecified: Secondary | ICD-10-CM | POA: Diagnosis not present

## 2019-03-10 DIAGNOSIS — G20 Parkinson's disease: Secondary | ICD-10-CM | POA: Diagnosis not present

## 2019-03-10 DIAGNOSIS — R296 Repeated falls: Secondary | ICD-10-CM | POA: Diagnosis not present

## 2019-03-10 NOTE — Telephone Encounter (Signed)
Gretchen (PT) called regarding this patient and wanting to report 2 Falls. She also mentioned that her BP has been out of Control. She said that the patient uses a Wrist Cuff and she does not believe it's reading correctly? Elzie Rings said that when she takes it manually it's always fine. Thanks

## 2019-03-10 NOTE — Telephone Encounter (Signed)
FYI

## 2019-03-10 NOTE — Telephone Encounter (Signed)
I will call them back and remind them.

## 2019-03-10 NOTE — Telephone Encounter (Signed)
See prior notes.  We have addressed this many, many times.  Pt is not to be walking at all, ever.

## 2019-03-13 DIAGNOSIS — M4807 Spinal stenosis, lumbosacral region: Secondary | ICD-10-CM | POA: Diagnosis not present

## 2019-03-13 DIAGNOSIS — M5135 Other intervertebral disc degeneration, thoracolumbar region: Secondary | ICD-10-CM | POA: Diagnosis not present

## 2019-03-13 DIAGNOSIS — Z981 Arthrodesis status: Secondary | ICD-10-CM | POA: Diagnosis not present

## 2019-03-13 DIAGNOSIS — M960 Pseudarthrosis after fusion or arthrodesis: Secondary | ICD-10-CM | POA: Diagnosis not present

## 2019-03-17 ENCOUNTER — Telehealth: Payer: Self-pay | Admitting: Neurology

## 2019-03-17 DIAGNOSIS — M549 Dorsalgia, unspecified: Secondary | ICD-10-CM | POA: Diagnosis not present

## 2019-03-17 DIAGNOSIS — Z9181 History of falling: Secondary | ICD-10-CM | POA: Diagnosis not present

## 2019-03-17 DIAGNOSIS — R296 Repeated falls: Secondary | ICD-10-CM | POA: Diagnosis not present

## 2019-03-17 DIAGNOSIS — G20 Parkinson's disease: Secondary | ICD-10-CM | POA: Diagnosis not present

## 2019-03-17 NOTE — Telephone Encounter (Signed)
FYI

## 2019-03-17 NOTE — Telephone Encounter (Signed)
As per the many previous phone calls regarding this, Alexandra Lindsey is not to be walking from my standpoint.  If she wouldn't walk, it would be hard to fall.  [name] and I have several serious conversations regarding potential serious consequences of walking/falls including brain bleeds and death.

## 2019-03-17 NOTE — Telephone Encounter (Signed)
Alexandra Lindsey with Alexandra Lindsey called regarding Alexandra Lindsey. She wants to report a fall that she had yesterday and she did not have any injuries. She also wanted to report that her BP last week was good. She is wanting to Discharge Alexandra Lindsey next week. She said she has been seeing her for along time. Alexandra Lindsey is to see a Neurosurgeon in June and Alexandra Lindsey said that in June if there is a new plan she can pick back up with her if she has Surgery. Thanks

## 2019-03-18 NOTE — Telephone Encounter (Signed)
Noted  

## 2019-03-24 DIAGNOSIS — Z9181 History of falling: Secondary | ICD-10-CM | POA: Diagnosis not present

## 2019-03-24 DIAGNOSIS — R296 Repeated falls: Secondary | ICD-10-CM | POA: Diagnosis not present

## 2019-03-24 DIAGNOSIS — M549 Dorsalgia, unspecified: Secondary | ICD-10-CM | POA: Diagnosis not present

## 2019-03-24 DIAGNOSIS — G20 Parkinson's disease: Secondary | ICD-10-CM | POA: Diagnosis not present

## 2019-03-27 DIAGNOSIS — M961 Postlaminectomy syndrome, not elsewhere classified: Secondary | ICD-10-CM | POA: Diagnosis not present

## 2019-04-08 ENCOUNTER — Other Ambulatory Visit: Payer: Self-pay | Admitting: Neurology

## 2019-04-22 ENCOUNTER — Telehealth: Payer: Self-pay | Admitting: Neurology

## 2019-04-22 NOTE — Telephone Encounter (Signed)
I don't exactly know what that is but it is not from parkinsons.

## 2019-04-22 NOTE — Telephone Encounter (Signed)
Called patient she was made aware and understand.

## 2019-04-22 NOTE — Telephone Encounter (Signed)
Patient is calling in about strange sensation in head and popping sensation. Please call her back at [phone]. Thanks!

## 2019-04-22 NOTE — Telephone Encounter (Signed)
Called spoke with patient she states it started a few weeks ago. She states its a popping or cracking sensation in head. It comes in goes 3 times this morning back of head. She states that Nurse that comes to her house said this was related to her PARKINSON. Pt had only 1 fall over the last 6 weeks. She wanted you to know that her Falls are less now.  Pt has appt scheduled with Neurosurgeon on June 4th Dr. Prince Rome with [facility]  She wanted to know if this is something that she can address with you or neurosurgeon

## 2019-05-01 DIAGNOSIS — M48062 Spinal stenosis, lumbar region with neurogenic claudication: Secondary | ICD-10-CM | POA: Diagnosis not present

## 2019-05-01 DIAGNOSIS — M960 Pseudarthrosis after fusion or arthrodesis: Secondary | ICD-10-CM | POA: Diagnosis not present

## 2019-05-01 DIAGNOSIS — M419 Scoliosis, unspecified: Secondary | ICD-10-CM | POA: Diagnosis not present

## 2019-05-05 ENCOUNTER — Other Ambulatory Visit: Payer: Self-pay | Admitting: Internal Medicine

## 2019-05-05 DIAGNOSIS — M25552 Pain in left hip: Secondary | ICD-10-CM

## 2019-05-05 DIAGNOSIS — M7062 Trochanteric bursitis, left hip: Secondary | ICD-10-CM

## 2019-05-05 DIAGNOSIS — G8928 Other chronic postprocedural pain: Secondary | ICD-10-CM

## 2019-05-12 DIAGNOSIS — Z01812 Encounter for preprocedural laboratory examination: Secondary | ICD-10-CM | POA: Diagnosis not present

## 2019-05-12 DIAGNOSIS — M419 Scoliosis, unspecified: Secondary | ICD-10-CM | POA: Diagnosis not present

## 2019-05-12 DIAGNOSIS — Z1159 Encounter for screening for other viral diseases: Secondary | ICD-10-CM | POA: Diagnosis not present

## 2019-05-12 DIAGNOSIS — Z01818 Encounter for other preprocedural examination: Secondary | ICD-10-CM | POA: Diagnosis not present

## 2019-05-13 DIAGNOSIS — Z885 Allergy status to narcotic agent status: Secondary | ICD-10-CM | POA: Diagnosis not present

## 2019-05-13 DIAGNOSIS — Z888 Allergy status to other drugs, medicaments and biological substances status: Secondary | ICD-10-CM | POA: Diagnosis not present

## 2019-05-13 DIAGNOSIS — M4324 Fusion of spine, thoracic region: Secondary | ICD-10-CM | POA: Diagnosis not present

## 2019-05-13 DIAGNOSIS — Z8249 Family history of ischemic heart disease and other diseases of the circulatory system: Secondary | ICD-10-CM | POA: Diagnosis not present

## 2019-05-13 DIAGNOSIS — G894 Chronic pain syndrome: Secondary | ICD-10-CM | POA: Diagnosis not present

## 2019-05-13 DIAGNOSIS — T8119XA Other postprocedural shock, initial encounter: Secondary | ICD-10-CM | POA: Diagnosis not present

## 2019-05-13 DIAGNOSIS — G20A1 Parkinson's disease without dyskinesia, without mention of fluctuations: Secondary | ICD-10-CM | POA: Insufficient documentation

## 2019-05-13 DIAGNOSIS — G92 Toxic encephalopathy: Secondary | ICD-10-CM | POA: Diagnosis not present

## 2019-05-13 DIAGNOSIS — M5416 Radiculopathy, lumbar region: Secondary | ICD-10-CM | POA: Diagnosis not present

## 2019-05-13 DIAGNOSIS — G20 Parkinson's disease: Secondary | ICD-10-CM | POA: Insufficient documentation

## 2019-05-13 DIAGNOSIS — Z01810 Encounter for preprocedural cardiovascular examination: Secondary | ICD-10-CM | POA: Diagnosis not present

## 2019-05-13 DIAGNOSIS — Z880 Allergy status to penicillin: Secondary | ICD-10-CM | POA: Diagnosis not present

## 2019-05-13 DIAGNOSIS — D62 Acute posthemorrhagic anemia: Secondary | ICD-10-CM | POA: Diagnosis not present

## 2019-05-13 DIAGNOSIS — I9581 Postprocedural hypotension: Secondary | ICD-10-CM | POA: Diagnosis not present

## 2019-05-13 DIAGNOSIS — J9601 Acute respiratory failure with hypoxia: Secondary | ICD-10-CM | POA: Diagnosis not present

## 2019-05-13 DIAGNOSIS — Z90710 Acquired absence of both cervix and uterus: Secondary | ICD-10-CM | POA: Diagnosis not present

## 2019-05-13 DIAGNOSIS — M419 Scoliosis, unspecified: Secondary | ICD-10-CM | POA: Diagnosis not present

## 2019-05-13 DIAGNOSIS — J9602 Acute respiratory failure with hypercapnia: Secondary | ICD-10-CM | POA: Diagnosis not present

## 2019-05-19 DIAGNOSIS — R451 Restlessness and agitation: Secondary | ICD-10-CM | POA: Diagnosis not present

## 2019-05-19 DIAGNOSIS — Z8249 Family history of ischemic heart disease and other diseases of the circulatory system: Secondary | ICD-10-CM | POA: Diagnosis not present

## 2019-05-19 DIAGNOSIS — I9581 Postprocedural hypotension: Secondary | ICD-10-CM | POA: Diagnosis not present

## 2019-05-19 DIAGNOSIS — Z880 Allergy status to penicillin: Secondary | ICD-10-CM | POA: Diagnosis not present

## 2019-05-19 DIAGNOSIS — Z4682 Encounter for fitting and adjustment of non-vascular catheter: Secondary | ICD-10-CM | POA: Diagnosis not present

## 2019-05-19 DIAGNOSIS — J9601 Acute respiratory failure with hypoxia: Secondary | ICD-10-CM | POA: Diagnosis not present

## 2019-05-19 DIAGNOSIS — R0689 Other abnormalities of breathing: Secondary | ICD-10-CM | POA: Diagnosis not present

## 2019-05-19 DIAGNOSIS — E872 Acidosis: Secondary | ICD-10-CM | POA: Diagnosis not present

## 2019-05-19 DIAGNOSIS — T8119XA Other postprocedural shock, initial encounter: Secondary | ICD-10-CM | POA: Diagnosis not present

## 2019-05-19 DIAGNOSIS — M4184 Other forms of scoliosis, thoracic region: Secondary | ICD-10-CM | POA: Diagnosis not present

## 2019-05-19 DIAGNOSIS — M549 Dorsalgia, unspecified: Secondary | ICD-10-CM | POA: Diagnosis not present

## 2019-05-19 DIAGNOSIS — Z885 Allergy status to narcotic agent status: Secondary | ICD-10-CM | POA: Diagnosis not present

## 2019-05-19 DIAGNOSIS — G8929 Other chronic pain: Secondary | ICD-10-CM | POA: Diagnosis not present

## 2019-05-19 DIAGNOSIS — Z888 Allergy status to other drugs, medicaments and biological substances status: Secondary | ICD-10-CM | POA: Diagnosis not present

## 2019-05-19 DIAGNOSIS — M4036 Flatback syndrome, lumbar region: Secondary | ICD-10-CM | POA: Diagnosis not present

## 2019-05-19 DIAGNOSIS — D62 Acute posthemorrhagic anemia: Secondary | ICD-10-CM | POA: Diagnosis not present

## 2019-05-19 DIAGNOSIS — G9340 Encephalopathy, unspecified: Secondary | ICD-10-CM | POA: Diagnosis not present

## 2019-05-19 DIAGNOSIS — Z90710 Acquired absence of both cervix and uterus: Secondary | ICD-10-CM | POA: Diagnosis not present

## 2019-05-19 DIAGNOSIS — J9691 Respiratory failure, unspecified with hypoxia: Secondary | ICD-10-CM | POA: Diagnosis not present

## 2019-05-19 DIAGNOSIS — R579 Shock, unspecified: Secondary | ICD-10-CM | POA: Diagnosis not present

## 2019-05-19 DIAGNOSIS — G8918 Other acute postprocedural pain: Secondary | ICD-10-CM | POA: Diagnosis not present

## 2019-05-19 DIAGNOSIS — G92 Toxic encephalopathy: Secondary | ICD-10-CM | POA: Diagnosis present

## 2019-05-19 DIAGNOSIS — Z472 Encounter for removal of internal fixation device: Secondary | ICD-10-CM | POA: Diagnosis not present

## 2019-05-19 DIAGNOSIS — F329 Major depressive disorder, single episode, unspecified: Secondary | ICD-10-CM | POA: Diagnosis not present

## 2019-05-19 DIAGNOSIS — R001 Bradycardia, unspecified: Secondary | ICD-10-CM | POA: Diagnosis not present

## 2019-05-19 DIAGNOSIS — I959 Hypotension, unspecified: Secondary | ICD-10-CM | POA: Diagnosis not present

## 2019-05-19 DIAGNOSIS — G20 Parkinson's disease: Secondary | ICD-10-CM | POA: Diagnosis present

## 2019-05-19 DIAGNOSIS — Z981 Arthrodesis status: Secondary | ICD-10-CM | POA: Diagnosis not present

## 2019-05-19 DIAGNOSIS — M419 Scoliosis, unspecified: Secondary | ICD-10-CM | POA: Diagnosis present

## 2019-05-19 DIAGNOSIS — M5416 Radiculopathy, lumbar region: Secondary | ICD-10-CM | POA: Diagnosis present

## 2019-05-19 DIAGNOSIS — J9602 Acute respiratory failure with hypercapnia: Secondary | ICD-10-CM | POA: Diagnosis not present

## 2019-05-19 DIAGNOSIS — R578 Other shock: Secondary | ICD-10-CM | POA: Diagnosis not present

## 2019-05-19 DIAGNOSIS — I9589 Other hypotension: Secondary | ICD-10-CM | POA: Diagnosis not present

## 2019-05-19 DIAGNOSIS — R140 Abdominal distension (gaseous): Secondary | ICD-10-CM | POA: Diagnosis not present

## 2019-05-19 DIAGNOSIS — R079 Chest pain, unspecified: Secondary | ICD-10-CM | POA: Diagnosis not present

## 2019-05-19 DIAGNOSIS — G894 Chronic pain syndrome: Secondary | ICD-10-CM | POA: Diagnosis present

## 2019-05-19 DIAGNOSIS — M4186 Other forms of scoliosis, lumbar region: Secondary | ICD-10-CM | POA: Diagnosis not present

## 2019-05-19 DIAGNOSIS — M961 Postlaminectomy syndrome, not elsewhere classified: Secondary | ICD-10-CM | POA: Diagnosis not present

## 2019-05-28 DIAGNOSIS — Z4789 Encounter for other orthopedic aftercare: Secondary | ICD-10-CM | POA: Diagnosis not present

## 2019-05-28 DIAGNOSIS — G20 Parkinson's disease: Secondary | ICD-10-CM | POA: Diagnosis not present

## 2019-05-28 DIAGNOSIS — M961 Postlaminectomy syndrome, not elsewhere classified: Secondary | ICD-10-CM | POA: Diagnosis not present

## 2019-05-28 DIAGNOSIS — M5416 Radiculopathy, lumbar region: Secondary | ICD-10-CM | POA: Diagnosis not present

## 2019-05-28 DIAGNOSIS — M419 Scoliosis, unspecified: Secondary | ICD-10-CM | POA: Diagnosis not present

## 2019-05-28 DIAGNOSIS — Z981 Arthrodesis status: Secondary | ICD-10-CM | POA: Diagnosis not present

## 2019-05-28 DIAGNOSIS — G894 Chronic pain syndrome: Secondary | ICD-10-CM | POA: Diagnosis not present

## 2019-05-28 DIAGNOSIS — Z9181 History of falling: Secondary | ICD-10-CM | POA: Diagnosis not present

## 2019-05-30 DIAGNOSIS — G894 Chronic pain syndrome: Secondary | ICD-10-CM | POA: Diagnosis not present

## 2019-05-30 DIAGNOSIS — M5416 Radiculopathy, lumbar region: Secondary | ICD-10-CM | POA: Diagnosis not present

## 2019-05-30 DIAGNOSIS — M961 Postlaminectomy syndrome, not elsewhere classified: Secondary | ICD-10-CM | POA: Diagnosis not present

## 2019-05-30 DIAGNOSIS — G20 Parkinson's disease: Secondary | ICD-10-CM | POA: Diagnosis not present

## 2019-05-30 DIAGNOSIS — M419 Scoliosis, unspecified: Secondary | ICD-10-CM | POA: Diagnosis not present

## 2019-05-30 DIAGNOSIS — Z4789 Encounter for other orthopedic aftercare: Secondary | ICD-10-CM | POA: Diagnosis not present

## 2019-05-31 DIAGNOSIS — M419 Scoliosis, unspecified: Secondary | ICD-10-CM | POA: Diagnosis not present

## 2019-05-31 DIAGNOSIS — G894 Chronic pain syndrome: Secondary | ICD-10-CM | POA: Diagnosis not present

## 2019-05-31 DIAGNOSIS — Z4789 Encounter for other orthopedic aftercare: Secondary | ICD-10-CM | POA: Diagnosis not present

## 2019-05-31 DIAGNOSIS — M5416 Radiculopathy, lumbar region: Secondary | ICD-10-CM | POA: Diagnosis not present

## 2019-05-31 DIAGNOSIS — M961 Postlaminectomy syndrome, not elsewhere classified: Secondary | ICD-10-CM | POA: Diagnosis not present

## 2019-05-31 DIAGNOSIS — G20 Parkinson's disease: Secondary | ICD-10-CM | POA: Diagnosis not present

## 2019-06-01 DIAGNOSIS — M4324 Fusion of spine, thoracic region: Secondary | ICD-10-CM | POA: Diagnosis not present

## 2019-06-01 DIAGNOSIS — Y998 Other external cause status: Secondary | ICD-10-CM | POA: Diagnosis not present

## 2019-06-01 DIAGNOSIS — R002 Palpitations: Secondary | ICD-10-CM | POA: Diagnosis not present

## 2019-06-01 DIAGNOSIS — N3090 Cystitis, unspecified without hematuria: Secondary | ICD-10-CM | POA: Diagnosis not present

## 2019-06-01 DIAGNOSIS — R52 Pain, unspecified: Secondary | ICD-10-CM | POA: Diagnosis not present

## 2019-06-01 DIAGNOSIS — R0902 Hypoxemia: Secondary | ICD-10-CM | POA: Diagnosis not present

## 2019-06-01 DIAGNOSIS — E162 Hypoglycemia, unspecified: Secondary | ICD-10-CM | POA: Diagnosis not present

## 2019-06-01 DIAGNOSIS — I517 Cardiomegaly: Secondary | ICD-10-CM | POA: Diagnosis not present

## 2019-06-01 DIAGNOSIS — M545 Low back pain: Secondary | ICD-10-CM | POA: Diagnosis not present

## 2019-06-01 DIAGNOSIS — Y92009 Unspecified place in unspecified non-institutional (private) residence as the place of occurrence of the external cause: Secondary | ICD-10-CM | POA: Diagnosis not present

## 2019-06-01 DIAGNOSIS — M4327 Fusion of spine, lumbosacral region: Secondary | ICD-10-CM | POA: Diagnosis not present

## 2019-06-01 DIAGNOSIS — E161 Other hypoglycemia: Secondary | ICD-10-CM | POA: Diagnosis not present

## 2019-06-01 DIAGNOSIS — M4317 Spondylolisthesis, lumbosacral region: Secondary | ICD-10-CM | POA: Diagnosis not present

## 2019-06-01 DIAGNOSIS — M546 Pain in thoracic spine: Secondary | ICD-10-CM | POA: Diagnosis not present

## 2019-06-01 DIAGNOSIS — R0602 Shortness of breath: Secondary | ICD-10-CM | POA: Diagnosis not present

## 2019-06-01 DIAGNOSIS — M5134 Other intervertebral disc degeneration, thoracic region: Secondary | ICD-10-CM | POA: Diagnosis not present

## 2019-06-01 DIAGNOSIS — M5489 Other dorsalgia: Secondary | ICD-10-CM | POA: Diagnosis not present

## 2019-06-01 DIAGNOSIS — W1839XA Other fall on same level, initial encounter: Secondary | ICD-10-CM | POA: Diagnosis not present

## 2019-06-02 DIAGNOSIS — M4324 Fusion of spine, thoracic region: Secondary | ICD-10-CM | POA: Diagnosis not present

## 2019-06-02 DIAGNOSIS — M4327 Fusion of spine, lumbosacral region: Secondary | ICD-10-CM | POA: Diagnosis not present

## 2019-06-02 DIAGNOSIS — M961 Postlaminectomy syndrome, not elsewhere classified: Secondary | ICD-10-CM | POA: Diagnosis not present

## 2019-06-02 DIAGNOSIS — M5416 Radiculopathy, lumbar region: Secondary | ICD-10-CM | POA: Diagnosis not present

## 2019-06-02 DIAGNOSIS — M4317 Spondylolisthesis, lumbosacral region: Secondary | ICD-10-CM | POA: Diagnosis not present

## 2019-06-02 DIAGNOSIS — M419 Scoliosis, unspecified: Secondary | ICD-10-CM | POA: Diagnosis not present

## 2019-06-02 DIAGNOSIS — G20 Parkinson's disease: Secondary | ICD-10-CM | POA: Diagnosis not present

## 2019-06-02 DIAGNOSIS — G894 Chronic pain syndrome: Secondary | ICD-10-CM | POA: Diagnosis not present

## 2019-06-02 DIAGNOSIS — Z4789 Encounter for other orthopedic aftercare: Secondary | ICD-10-CM | POA: Diagnosis not present

## 2019-06-02 DIAGNOSIS — I517 Cardiomegaly: Secondary | ICD-10-CM | POA: Diagnosis not present

## 2019-06-02 DIAGNOSIS — M5134 Other intervertebral disc degeneration, thoracic region: Secondary | ICD-10-CM | POA: Diagnosis not present

## 2019-06-03 DIAGNOSIS — Z4789 Encounter for other orthopedic aftercare: Secondary | ICD-10-CM | POA: Diagnosis not present

## 2019-06-03 DIAGNOSIS — M5416 Radiculopathy, lumbar region: Secondary | ICD-10-CM | POA: Diagnosis not present

## 2019-06-03 DIAGNOSIS — G894 Chronic pain syndrome: Secondary | ICD-10-CM | POA: Diagnosis not present

## 2019-06-03 DIAGNOSIS — G20 Parkinson's disease: Secondary | ICD-10-CM | POA: Diagnosis not present

## 2019-06-03 DIAGNOSIS — M419 Scoliosis, unspecified: Secondary | ICD-10-CM | POA: Diagnosis not present

## 2019-06-03 DIAGNOSIS — Z981 Arthrodesis status: Secondary | ICD-10-CM | POA: Diagnosis not present

## 2019-06-03 DIAGNOSIS — Z4802 Encounter for removal of sutures: Secondary | ICD-10-CM | POA: Diagnosis not present

## 2019-06-03 DIAGNOSIS — M961 Postlaminectomy syndrome, not elsewhere classified: Secondary | ICD-10-CM | POA: Diagnosis not present

## 2019-06-05 ENCOUNTER — Ambulatory Visit: Payer: Medicare Other | Admitting: Physician Assistant

## 2019-06-05 DIAGNOSIS — M5416 Radiculopathy, lumbar region: Secondary | ICD-10-CM | POA: Diagnosis not present

## 2019-06-05 DIAGNOSIS — G20 Parkinson's disease: Secondary | ICD-10-CM | POA: Diagnosis not present

## 2019-06-05 DIAGNOSIS — M419 Scoliosis, unspecified: Secondary | ICD-10-CM | POA: Diagnosis not present

## 2019-06-05 DIAGNOSIS — Z4789 Encounter for other orthopedic aftercare: Secondary | ICD-10-CM | POA: Diagnosis not present

## 2019-06-05 DIAGNOSIS — M961 Postlaminectomy syndrome, not elsewhere classified: Secondary | ICD-10-CM | POA: Diagnosis not present

## 2019-06-05 DIAGNOSIS — G894 Chronic pain syndrome: Secondary | ICD-10-CM | POA: Diagnosis not present

## 2019-06-05 DIAGNOSIS — S233XXA Sprain of ligaments of thoracic spine, initial encounter: Secondary | ICD-10-CM | POA: Diagnosis not present

## 2019-06-05 DIAGNOSIS — S335XXA Sprain of ligaments of lumbar spine, initial encounter: Secondary | ICD-10-CM | POA: Diagnosis not present

## 2019-06-05 NOTE — Progress Notes (Signed)
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  Service: Neurosurgery;  Laterality: N/A;   ROTATOR CUFF REPAIR Left 10-07-12    SOCIAL HISTORY:   Social History   Socioeconomic History   Marital status: Married    Spouse name: Not on file   Number of children: Not on file   Years of education: Not on file   Highest education level: Some college, no degree  Occupational History   Occupation: retired    Comment: Museum/gallery exhibitions officer strain: Not on file   Food insecurity    Worry: Not on file    Inability: Not on file   Transportation needs    Medical: Not on file    Non-medical: Not on file  Tobacco Use   Smoking status: Never Smoker   Smokeless  tobacco: Never Used  Substance and Sexual Activity   Alcohol use: No    Alcohol/week: 0.0 standard drinks   Drug use: No   Sexual activity: Not on file  Lifestyle   Physical activity    Days per week: Not on file    Minutes per session: Not on file   Stress: Not on file  Relationships   Social connections    Talks on phone: Not on file    Gets together: Not on file    Attends religious service: Not on file    Active member of club or organization: Not on file    Attends meetings of clubs or  organizations: Not on file    Relationship status: Not on file   Intimate partner violence    Fear of current or ex partner: Not on file    Emotionally abused: Not on file    Physically abused: Not on file    Forced sexual activity: Not on file  Other Topics Concern   Not on file  Social History Narrative   Not on file    FAMILY HISTORY:   Family Status  Relation Name Status   Mother  Deceased       esophageal cancer, heart attack   Father  Deceased       suicide   Brother  Deceased       suicide   Brother  Deceased       HTN, hypercholesterolemia   Brother  Alive       mental retardation    ROS:    Review of Systems  Constitutional: Negative.   HENT: Negative.   Eyes: Negative.   Respiratory: Positive for shortness of breath (intermittent).   Cardiovascular: Positive for chest pain (chronic, intermittent).  Skin: Negative.   Psychiatric/Behavioral: Positive for hallucinations.     PHYSICAL EXAMINATION:    VITALS:   Vitals:   06/09/19 0828  BP: 120/62  Pulse: 78  Temp: 97.6 F (36.4 C)  SpO2: 96%  Weight: 132 lb 6.4 oz (60.1 kg)   Orthostatic VS for the past 24 hrs (Last 3 readings):  BP- Lying Pulse- Lying BP- Sitting Pulse- Sitting BP- Standing at 0 minutes Pulse- Standing at 0 minutes  06/09/19 0829 130/70 83 120/62 78 110/64 88      GEN:  The patient appears stated age and is in NAD. HEENT:  Normocephalic, atraumatic.  The mucous membranes are moist. The superficial temporal arteries are without ropiness or tenderness. CV:  RRR Lungs:  CTAB Neck/HEME:  There are no carotid bruits bilaterally. Derm:  Surgical incision is healing well.  Slightly erythematous but no drainage.  Neurological examination:  Orientation: The patient is alert and oriented x3. Cranial nerves: There is good facial symmetry. The speech is fluent and clear. Soft palate rises symmetrically and there is no tongue deviation. Hearing is intact to conversational  tone. Sensation: Sensation is intact to light touch throughout Motor: Strength is 5/5 in the bilateral upper and lower extremities.   Shoulder shrug is equal and symmetric.  There is no pronator drift.   Movement examination: Tone: There is normal tone in the UE/LE Abnormal movements: there is no dyskinesia today Coordination:  There is decremation with finger taps bilaterally Gait and Station: did not ambulate the patient today.  She did try to stand up on her own and was very unbalanced (same as last visit)  ASSESSMENT/PLAN:  1.  Idiopathic PD.    -continue Carbidopa Levodopa as follows:  3 tablets at 5:00 am, 2 tablets at 9:00 am, 1 tablet at 1:00 pm, 2 tablets at 5:00 pm and one at night.  -decrease pramipexole, 0.5 mg, 1 po bid for 1 week then 1 po q day for a week and then stop the medication.  She is having hallucinations.  If no help, may need to try nuplazid  -Told her yet again that she should not be walking at all.  There would be no falls (or very few) if she was not walking.   2.  Urinary incontinence and frequency  -doing better in this regard 3.  Back pain  -increase in falls started after back surgery 2 years ago.  She recently had another back surgery at [facility] on May 19, 2019.  She had postoperative complications including loss of blood/anemia requiring transfusion as well as hypotension. 4.  RBD.  -  She did not want to take the clonazepam, primarily because of its addictive properties.  She did try it for a short period of time.  She reports that she is screaming out some, but has not had any falling out of the bed. 5.  Orthostatic Hypotension  -Continue midodrine, 10 mg twice per day.  I asked her to check her blood pressure at home and keep a log and let me know how that looks. 6.  Will plan on doing a phone visit in 8 weeks so I can check in on her.  Much greater than 50% of this visit was spent in counseling and coordinating care.  Total face to face time:  25  min

## 2019-06-06 DIAGNOSIS — G20 Parkinson's disease: Secondary | ICD-10-CM | POA: Diagnosis not present

## 2019-06-06 DIAGNOSIS — G894 Chronic pain syndrome: Secondary | ICD-10-CM | POA: Diagnosis not present

## 2019-06-06 DIAGNOSIS — M961 Postlaminectomy syndrome, not elsewhere classified: Secondary | ICD-10-CM | POA: Diagnosis not present

## 2019-06-06 DIAGNOSIS — M5416 Radiculopathy, lumbar region: Secondary | ICD-10-CM | POA: Diagnosis not present

## 2019-06-06 DIAGNOSIS — M419 Scoliosis, unspecified: Secondary | ICD-10-CM | POA: Diagnosis not present

## 2019-06-06 DIAGNOSIS — Z4789 Encounter for other orthopedic aftercare: Secondary | ICD-10-CM | POA: Diagnosis not present

## 2019-06-07 DIAGNOSIS — Z4789 Encounter for other orthopedic aftercare: Secondary | ICD-10-CM | POA: Diagnosis not present

## 2019-06-07 DIAGNOSIS — G894 Chronic pain syndrome: Secondary | ICD-10-CM | POA: Diagnosis not present

## 2019-06-07 DIAGNOSIS — G20 Parkinson's disease: Secondary | ICD-10-CM | POA: Diagnosis not present

## 2019-06-07 DIAGNOSIS — M5416 Radiculopathy, lumbar region: Secondary | ICD-10-CM | POA: Diagnosis not present

## 2019-06-07 DIAGNOSIS — M419 Scoliosis, unspecified: Secondary | ICD-10-CM | POA: Diagnosis not present

## 2019-06-07 DIAGNOSIS — M961 Postlaminectomy syndrome, not elsewhere classified: Secondary | ICD-10-CM | POA: Diagnosis not present

## 2019-06-09 ENCOUNTER — Encounter: Payer: Self-pay | Admitting: Neurology

## 2019-06-09 ENCOUNTER — Other Ambulatory Visit: Payer: Self-pay

## 2019-06-09 ENCOUNTER — Ambulatory Visit (INDEPENDENT_AMBULATORY_CARE_PROVIDER_SITE_OTHER): Payer: Medicare Other | Admitting: Neurology

## 2019-06-09 VITALS — BP 120/62 | HR 78 | Temp 97.6°F | Wt 132.4 lb

## 2019-06-09 DIAGNOSIS — M5416 Radiculopathy, lumbar region: Secondary | ICD-10-CM | POA: Diagnosis not present

## 2019-06-09 DIAGNOSIS — M545 Low back pain, unspecified: Secondary | ICD-10-CM

## 2019-06-09 DIAGNOSIS — G20 Parkinson's disease: Secondary | ICD-10-CM

## 2019-06-09 DIAGNOSIS — M419 Scoliosis, unspecified: Secondary | ICD-10-CM | POA: Diagnosis not present

## 2019-06-09 DIAGNOSIS — Z4789 Encounter for other orthopedic aftercare: Secondary | ICD-10-CM | POA: Diagnosis not present

## 2019-06-09 DIAGNOSIS — G894 Chronic pain syndrome: Secondary | ICD-10-CM | POA: Diagnosis not present

## 2019-06-09 DIAGNOSIS — M961 Postlaminectomy syndrome, not elsewhere classified: Secondary | ICD-10-CM | POA: Diagnosis not present

## 2019-06-09 DIAGNOSIS — G903 Multi-system degeneration of the autonomic nervous system: Secondary | ICD-10-CM

## 2019-06-09 NOTE — Patient Instructions (Signed)
1.  I am going to get you off of pramipexole because of the confusion and hallucinations.  You will decrease pramipexole 0.5 mg - 1 tablet twice per day for a week, then 1 tablet twice a day for a week and then stop the medication 2.  If you still have hallucinations once off of the pramipexole, we may add something for that like Nuplazid 3.  Please check your blood pressure once per day or so, in various positions (lying vs sitting vs standing) and record for me 4.  I do not want you walking!  If you don't walk, you cannot fall 5.  You asked me about the in patient rehab stay.  I do think that it is good and the rehab is more intense but it is very difficult to get approved once patients are at home.  It generally requires a hospital stay immediately before hand to get approved for an in patient rehab stay.

## 2019-06-10 ENCOUNTER — Telehealth: Payer: Self-pay | Admitting: *Deleted

## 2019-06-10 NOTE — Telephone Encounter (Signed)
Per Dr Melford Aase, the home health agency should contact the surgeon, who did her recent back surgery, to complete the FL2 form.  [facility] is aware and states the patient is continuing to fall, and the patient and her spouse have requested this placement. The request is being sent to the surgeon by the agency.

## 2019-06-11 DIAGNOSIS — M961 Postlaminectomy syndrome, not elsewhere classified: Secondary | ICD-10-CM | POA: Diagnosis not present

## 2019-06-11 DIAGNOSIS — M5416 Radiculopathy, lumbar region: Secondary | ICD-10-CM | POA: Diagnosis not present

## 2019-06-11 DIAGNOSIS — Z4789 Encounter for other orthopedic aftercare: Secondary | ICD-10-CM | POA: Diagnosis not present

## 2019-06-11 DIAGNOSIS — G20 Parkinson's disease: Secondary | ICD-10-CM | POA: Diagnosis not present

## 2019-06-11 DIAGNOSIS — M419 Scoliosis, unspecified: Secondary | ICD-10-CM | POA: Diagnosis not present

## 2019-06-11 DIAGNOSIS — G894 Chronic pain syndrome: Secondary | ICD-10-CM | POA: Diagnosis not present

## 2019-06-13 DIAGNOSIS — G20 Parkinson's disease: Secondary | ICD-10-CM | POA: Diagnosis not present

## 2019-06-13 DIAGNOSIS — G894 Chronic pain syndrome: Secondary | ICD-10-CM | POA: Diagnosis not present

## 2019-06-13 DIAGNOSIS — M961 Postlaminectomy syndrome, not elsewhere classified: Secondary | ICD-10-CM | POA: Diagnosis not present

## 2019-06-13 DIAGNOSIS — Z4789 Encounter for other orthopedic aftercare: Secondary | ICD-10-CM | POA: Diagnosis not present

## 2019-06-13 DIAGNOSIS — M5416 Radiculopathy, lumbar region: Secondary | ICD-10-CM | POA: Diagnosis not present

## 2019-06-13 DIAGNOSIS — M419 Scoliosis, unspecified: Secondary | ICD-10-CM | POA: Diagnosis not present

## 2019-06-14 DIAGNOSIS — M419 Scoliosis, unspecified: Secondary | ICD-10-CM | POA: Diagnosis not present

## 2019-06-14 DIAGNOSIS — M5416 Radiculopathy, lumbar region: Secondary | ICD-10-CM | POA: Diagnosis not present

## 2019-06-14 DIAGNOSIS — G20 Parkinson's disease: Secondary | ICD-10-CM | POA: Diagnosis not present

## 2019-06-14 DIAGNOSIS — M961 Postlaminectomy syndrome, not elsewhere classified: Secondary | ICD-10-CM | POA: Diagnosis not present

## 2019-06-14 DIAGNOSIS — Z4789 Encounter for other orthopedic aftercare: Secondary | ICD-10-CM | POA: Diagnosis not present

## 2019-06-14 DIAGNOSIS — G894 Chronic pain syndrome: Secondary | ICD-10-CM | POA: Diagnosis not present

## 2019-06-15 DIAGNOSIS — M961 Postlaminectomy syndrome, not elsewhere classified: Secondary | ICD-10-CM | POA: Diagnosis not present

## 2019-06-15 DIAGNOSIS — M5416 Radiculopathy, lumbar region: Secondary | ICD-10-CM | POA: Diagnosis not present

## 2019-06-15 DIAGNOSIS — G894 Chronic pain syndrome: Secondary | ICD-10-CM | POA: Diagnosis not present

## 2019-06-15 DIAGNOSIS — Z4789 Encounter for other orthopedic aftercare: Secondary | ICD-10-CM | POA: Diagnosis not present

## 2019-06-15 DIAGNOSIS — G20 Parkinson's disease: Secondary | ICD-10-CM | POA: Diagnosis not present

## 2019-06-15 DIAGNOSIS — M419 Scoliosis, unspecified: Secondary | ICD-10-CM | POA: Diagnosis not present

## 2019-06-16 DIAGNOSIS — M961 Postlaminectomy syndrome, not elsewhere classified: Secondary | ICD-10-CM | POA: Diagnosis not present

## 2019-06-16 DIAGNOSIS — G894 Chronic pain syndrome: Secondary | ICD-10-CM | POA: Diagnosis not present

## 2019-06-16 DIAGNOSIS — M419 Scoliosis, unspecified: Secondary | ICD-10-CM | POA: Diagnosis not present

## 2019-06-16 DIAGNOSIS — G20 Parkinson's disease: Secondary | ICD-10-CM | POA: Diagnosis not present

## 2019-06-16 DIAGNOSIS — M5416 Radiculopathy, lumbar region: Secondary | ICD-10-CM | POA: Diagnosis not present

## 2019-06-16 DIAGNOSIS — Z4789 Encounter for other orthopedic aftercare: Secondary | ICD-10-CM | POA: Diagnosis not present

## 2019-06-17 DIAGNOSIS — M419 Scoliosis, unspecified: Secondary | ICD-10-CM | POA: Diagnosis not present

## 2019-06-17 DIAGNOSIS — G20 Parkinson's disease: Secondary | ICD-10-CM | POA: Diagnosis not present

## 2019-06-17 DIAGNOSIS — Z4789 Encounter for other orthopedic aftercare: Secondary | ICD-10-CM | POA: Diagnosis not present

## 2019-06-17 DIAGNOSIS — G894 Chronic pain syndrome: Secondary | ICD-10-CM | POA: Diagnosis not present

## 2019-06-17 DIAGNOSIS — M5416 Radiculopathy, lumbar region: Secondary | ICD-10-CM | POA: Diagnosis not present

## 2019-06-17 DIAGNOSIS — M961 Postlaminectomy syndrome, not elsewhere classified: Secondary | ICD-10-CM | POA: Diagnosis not present

## 2019-06-17 NOTE — Progress Notes (Signed)
Virtual Visit via Telephone Note The purpose of this virtual visit is to provide medical care while limiting exposure to the novel coronavirus.    Consent was obtained for phone visit:  Yes.   Answered questions that patient had about telehealth interaction:  Yes.   I discussed the limitations, risks, security and privacy concerns of performing an evaluation and management service by telephone. I also discussed with the patient that there may be a patient responsible charge related to this service. The patient expressed understanding and agreed to proceed.  Pt location: Home Physician Location: office Name of referring provider:  Unk Pinto, MD I connected with .Alexandra Lindsey at patients initiation/request on 07/01/2019 at  3:30 PM EDT by telephone and verified that I am speaking with the correct person using two identifiers.  Pt MRN:  [id number] Pt DOB:  Nov 11, 1941  Participants:  Pt   History of Present Illness:  Patient seen today in follow-up, accompanied by her husband who supplements history.  She is on carbidopa/levodopa 25/100, 3 tablets at 5 AM/2 tablets at 9 AM/1 tablet at 1 PM/2 tablets at 5 PM and 1 tablet at bedtime.  Last visit, I weaned her off of the pramipexole because of hallucinations.  She reports that she went back on that because "I remembered that when Dr. Erling Cruz put me on it on it years ago it helped me walk so I didn't want to go off of it."  she admits that she is taking "at least 2/day."  She did not think hallucinations were "that bad."  She is on midodrine, 10 mg twice per day for orthostasis.  She reports that blood pressure has been low, with today's blood pressure being 100/62.  The highest systolic blood pressure that she had recorded was 116.  clinic notes were reviewed from her June 20, 2019 visit from her physician assistant, and blood pressure was 79/50 in the left arm and they reported 116/90 in the right arm, but patient refused further evaluation.   Medical records have been received.  She is trying to get her neurosurgeon to fill out as an FL to form.  Reports falls were "better until last night when I fell 2 times."  Had chest x-rays and abdominal x-ray on July 24 because of falls.   Observations/Objective:   Vitals:   07/01/19 0752  BP: 110/62  Weight: 135 lb (61.2 kg)  Height: 4\' 11"  (1.499 m)     Assessment and Plan:   1.  Idiopathic PD.               -continue Carbidopa Levodopa as follows: 3 tablets at 5:00 am, 2 tablets at 9:00 am, 1 tablet at 1:00 pm, 2 tablets at 5:00 pm and one at night.             -Discussed again with the patient that she needs to follow the weaning schedule I had previously given her for pramipexole.  Discussed that it is never worth sacrificing cognition for motor function, but I also am not sure that the pramipexole is helping that much.  She is to decrease pramipexole, 0.5 mg, 1 po bid for 1 week then 1 po q day for a week and then stop the medication.  She is having hallucinations.  If no help, may need to try nuplazid             -Reiterated, as I have the last many visits, that she should not be walking.  She would not have risk of falling if she was not walking.                2.  Urinary incontinence and frequency             -doing better in this regard 3.  Back pain             -increase in falls started after back surgery 2 years ago.  She recently had another back surgery at [facility] on May 19, 2019.  She had postoperative complications including loss of blood/anemia requiring transfusion as well as hypotension. 4.  RBD.             -  She did not want to take the clonazepam, primarily because of its addictive properties.  She did try it for a short period of time.  She reports that she is screaming out some, but has not had any falling out of the bed. 5.  Orthostatic Hypotension             -Increase midodrine, 10 mg 3 times per day.  She is to raise the head of the bed.  She is to keep a  blood pressure log.  Follow Up Instructions:    -I discussed the assessment and treatment plan with the patient. The patient was provided an opportunity to ask questions and all were answered. The patient agreed with the plan and demonstrated an understanding of the instructions.   The patient was advised to call back or seek an in-person evaluation if the symptoms worsen or if the condition fails to improve as anticipated.    Total Time spent in visit with the patient was:  22 min, of which 100% of the time was spent in counseling .   Pt understands and agrees with the plan of care outlined.     Alonza Bogus, DO

## 2019-06-20 ENCOUNTER — Ambulatory Visit: Payer: Medicare Other | Admitting: Physician Assistant

## 2019-06-20 ENCOUNTER — Ambulatory Visit
Admission: RE | Admit: 2019-06-20 | Discharge: 2019-06-20 | Disposition: A | Discharge status: Home / Self Care | Payer: Medicare Other | Source: Ambulatory Visit | Attending: Physician Assistant | Admitting: Physician Assistant

## 2019-06-20 ENCOUNTER — Encounter: Payer: Self-pay | Admitting: Physician Assistant

## 2019-06-20 ENCOUNTER — Other Ambulatory Visit: Payer: Self-pay

## 2019-06-20 VITALS — BP 79/50 | HR 105 | Wt 135.0 lb

## 2019-06-20 DIAGNOSIS — M419 Scoliosis, unspecified: Secondary | ICD-10-CM | POA: Diagnosis not present

## 2019-06-20 DIAGNOSIS — R0602 Shortness of breath: Secondary | ICD-10-CM

## 2019-06-20 DIAGNOSIS — Z981 Arthrodesis status: Secondary | ICD-10-CM | POA: Diagnosis not present

## 2019-06-20 DIAGNOSIS — R140 Abdominal distension (gaseous): Secondary | ICD-10-CM

## 2019-06-20 DIAGNOSIS — Z4789 Encounter for other orthopedic aftercare: Secondary | ICD-10-CM | POA: Diagnosis not present

## 2019-06-20 DIAGNOSIS — R109 Unspecified abdominal pain: Secondary | ICD-10-CM

## 2019-06-20 DIAGNOSIS — M5416 Radiculopathy, lumbar region: Secondary | ICD-10-CM | POA: Diagnosis not present

## 2019-06-20 DIAGNOSIS — M961 Postlaminectomy syndrome, not elsewhere classified: Secondary | ICD-10-CM | POA: Diagnosis not present

## 2019-06-20 DIAGNOSIS — G894 Chronic pain syndrome: Secondary | ICD-10-CM | POA: Diagnosis not present

## 2019-06-20 DIAGNOSIS — I998 Other disorder of circulatory system: Secondary | ICD-10-CM

## 2019-06-20 DIAGNOSIS — G20 Parkinson's disease: Secondary | ICD-10-CM | POA: Diagnosis not present

## 2019-06-20 MED ORDER — OMEPRAZOLE 40 MG PO CPDR: 40.0000 mg | DELAYED_RELEASE_CAPSULE | Freq: Every day | ORAL | 1 refills | Status: DC

## 2019-06-20 NOTE — Progress Notes (Signed)
Subjective:    Patient ID: Alexandra Lindsey, female    DOB: Jul 07, 1941, 78 y.o.   MRN: [id number]  HPI 78 y.o. WF with history of parkinson's, HTN, chol, GERD, , hx of + cologuard and lumbar radiculopathy and stenosis s/p spinal fusion posterior T10-pelvic, TLIF L5-S1 on 05/19/19 calling with AB pain and swelling and SOB.   She states 2 weeks ago she fell into the wall and she has been having issues with SOB.  She states when she lays down at night and in the morning she is having trouble breathing, happens right after she lies down. Better with sitting up. Some SOB with movement. No fever, chills, cough,  wheezing. She will having squeezing chest pain x 20 mins, intermittent. Happens at rest. Husband states it has been going on for 10 + years, has history of heart cath.   States she fell this AM as well with her walker, fell backwards when she was trying to back up in the living room, did not hit her head, no LOC.    States her stomach has been "swelling" and feels tight since that time. Nothing helps it, nothing worse. Denies any swelling in her legs or anywhere else. She states her appetite is low, states she gets full very quickly. Denies nausea, vomiting. States she is passing gas, having BM's, she is on stool softeners. She denies burning with urination.  States she "aches all over".  She is on miralax and has been on opioids (hydrocodone 325 last fill 07/15 #20) for recent surgery. She is having BM every 1-2 days, has to strain some but not bad. No abnormal shape or color.   History of + cologuard 2018 but no colonoscopy was done, states did not need it.  AB surgeries: TAH, appendectomy, and hernia repair.   BMI is Body mass index is 26.37 kg/m., she is working on diet and exercise. Wt Readings from Last 3 Encounters:  06/20/19 135 lb (61.2 kg)  06/09/19 132 lb 6.4 oz (60.1 kg)  02/27/19 135 lb (61.2 kg)   Blood pressure (!) 79/50, pulse (!) 105, weight 135 lb (61.2 kg). She has  history of low blood pressure, follows with Dr. Carles Collet.  Left arm 79/50 Right arm 116/90  Lab Results  Component Value Date   ALT 3 (L) 08/01/2018   AST 9 (L) 08/01/2018   ALKPHOS 94 11/17/2016   BILITOT 0.3 08/01/2018   Lab Results  Component Value Date   WBC 5.5 08/01/2018   HGB 11.4 (L) 08/01/2018   HCT 34.9 (L) 08/01/2018   MCV 91.1 08/01/2018   PLT 301 08/01/2018   Lab Results  Component Value Date   CREATININE 1.05 (H) 08/01/2018   BUN 45 (H) 08/01/2018   NA 139 08/01/2018   K 4.5 08/01/2018   CL 108 08/01/2018   CO2 23 08/01/2018    Medications Current Outpatient Medications on File Prior to Visit  Medication Sig  . acetaminophen (TYLENOL) 500 MG tablet Take 1,000 mg by mouth 2 (two) times daily as needed for moderate pain or headache.  . albuterol (PROVENTIL HFA;VENTOLIN HFA) 108 (90 Base) MCG/ACT inhaler Inhale 1-2 puffs into the lungs every 6 (six) hours as needed for wheezing or shortness of breath.  . AMBULATORY NON FORMULARY MEDICATION Lift chair Dx: G20  . carbidopa-levodopa (SINEMET IR) 25-100 MG tablet TAKE 3 TABLETS AT 5AM, TAKE 2 TABLETS AT9AM, TAKE 1 TABLET AT 1PM, TAKE 2 TABLETS AT 5PM, AND TAKE 1 TABLET AT NIGHT  .  Cholecalciferol (VITAMIN D3) 5000 units CAPS Take 5,000 Units by mouth 2 (two) times daily.  . hydrocortisone cream 1 % Apply 1 application topically daily as needed for itching.  . IBUPROFEN PO Take by mouth as needed.  . midodrine (PROAMATINE) 10 MG tablet Take 1 tablet (10 mg total) by mouth 2 (two) times daily.  . polyethylene glycol (MIRALAX / GLYCOLAX) packet Take 17 g by mouth daily.  . pramipexole (MIRAPEX) 0.5 MG tablet TAKE 1 TABLET THREE TIMES PER DAY  . pregabalin (LYRICA) 150 MG capsule Take by mouth.  . sertraline (ZOLOFT) 100 MG tablet Take 1/2 to 1 tablet daily for Mood & Chronic Pain   No current facility-administered medications on file prior to visit.     Problem list She has Parkinson's disease ([place]); RBD (REM  behavioral disorder); Hyperlipidemia; Hypertension; GERD (gastroesophageal reflux disease); Abnormal glucose; Vitamin D deficiency; Medication management; SI (sacroiliac) joint dysfunction; Lumbar radiculopathy; Lumbar stenosis with neurogenic claudication; and Positive colorectal cancer screening using Cologuard test on their problem list.  Past Surgical History:  Procedure Laterality Date  . ABDOMINAL HYSTERECTOMY    . APPENDECTOMY    . BACK SURGERY  12/20/2015  . BACK SURGERY  09/27/2017  . BACK SURGERY  2020  . BUNIONECTOMY Bilateral 11-11-3  . CARDIAC CATHETERIZATION Bilateral 2013  . COLONOSCOPY    . HERNIA REPAIR    . LEFT HEART CATHETERIZATION WITH CORONARY ANGIOGRAM N/A 07/03/2012   Procedure: LEFT HEART CATHETERIZATION WITH CORONARY ANGIOGRAM;  Surgeon: Troy Sine, MD;  Location: [facility];  Service: Cardiovascular;  Laterality: N/A;  . REPAIR OF CEREBROSPINAL FLUID LEAK N/A 12/24/2015   Procedure: LUMBAR WOUND EXPLORATION, REPAIR OF CEREBROSPINAL FLUID LEAK, PLACEMENT OF LUMBAR DRAIN;  Surgeon: Kevan Ny Ditty, MD;  Location: [facility];  Service: Neurosurgery;  Laterality: N/A;  . ROTATOR CUFF REPAIR Left 10-07-12    Review of Systems See HPI    Objective:   Physical Exam General Appearance:Well sounding, in no apparent distress.  ENT/Mouth: No hoarseness, No cough for duration of visit.  Respiratory: completing full sentences without distress, without audible wheeze AB: patient states with pushing on her epigastric and RUQ there is pain, no pain with letting go.  Neuro: Awake and oriented X 3,  Psych:  Insight and Judgment appropriate.       Assessment & Plan:   BP discrepancy  patient declines ER referral Will get CXR rule out dissection however no active chest pain If CXR negative suggest cardio referral and [place] rule out subclavian stenosis/etc.   SOB (shortness of breath) -     DG Chest 2 View; Future - worse at night with lying down, no CP, no leg  swelling but with AB bloating - weight is stable- no signs of heart failure like PND, edema.  - will get CXR, treat GERD- may need cardio referral/echo  Abdominal pain, unspecified abdominal location with bloating -     DG Abd 1 View; Future -     US Abdomen Complete; Future - recent surgery, patient states she is passing gas/having BMs so no signs of obstruction but not on miralax at this time, will add on miralax ? Due to constipation - Add on prilosec, ? GERD - epigastric and RUQ pain on palpation by the patient- will get [place] - had positive cologuard without examination- if continues needs follow up Dr. Amedeo Plenty -     omeprazole (PRILOSEC) 40 MG capsule; Take 1 capsule (40 mg total) by mouth daily.  Go get a chest xray and AB [place] today  Do the miralax daily Get on prilosec Do smaller frequent meals  LONG DISCUSSION WITH HUSBAND AND PATIENT ABOUT GOING TO THE ER, THEY DECLINES GOING NOW, DISCUSSED STRICT ER PRECAUTIONS WILL CALL Monday FOR LAB OR OFFICE VISIT FOLLOW UP DEPENDING ON RESULTS TODAY AND HOW SHE DOES OVER THE WEEKEND.  Please go to the ER if you have any severe AB pain, unable to hold down food/water, blood in stool or vomit, chest pain, shortness of breath, or any worsening symptoms.    Future Appointments  Date Time Provider [place]  07/01/2019  3:30 PM [name], Eustace Quail, DO [facility] None

## 2019-06-21 DIAGNOSIS — M5416 Radiculopathy, lumbar region: Secondary | ICD-10-CM | POA: Diagnosis not present

## 2019-06-21 DIAGNOSIS — G20 Parkinson's disease: Secondary | ICD-10-CM | POA: Diagnosis not present

## 2019-06-21 DIAGNOSIS — G894 Chronic pain syndrome: Secondary | ICD-10-CM | POA: Diagnosis not present

## 2019-06-21 DIAGNOSIS — M419 Scoliosis, unspecified: Secondary | ICD-10-CM | POA: Diagnosis not present

## 2019-06-21 DIAGNOSIS — M961 Postlaminectomy syndrome, not elsewhere classified: Secondary | ICD-10-CM | POA: Diagnosis not present

## 2019-06-21 DIAGNOSIS — Z4789 Encounter for other orthopedic aftercare: Secondary | ICD-10-CM | POA: Diagnosis not present

## 2019-06-23 DIAGNOSIS — G20 Parkinson's disease: Secondary | ICD-10-CM | POA: Diagnosis not present

## 2019-06-23 DIAGNOSIS — G894 Chronic pain syndrome: Secondary | ICD-10-CM | POA: Diagnosis not present

## 2019-06-23 DIAGNOSIS — M961 Postlaminectomy syndrome, not elsewhere classified: Secondary | ICD-10-CM | POA: Diagnosis not present

## 2019-06-23 DIAGNOSIS — Z4789 Encounter for other orthopedic aftercare: Secondary | ICD-10-CM | POA: Diagnosis not present

## 2019-06-23 DIAGNOSIS — M5416 Radiculopathy, lumbar region: Secondary | ICD-10-CM | POA: Diagnosis not present

## 2019-06-23 DIAGNOSIS — M419 Scoliosis, unspecified: Secondary | ICD-10-CM | POA: Diagnosis not present

## 2019-06-24 DIAGNOSIS — M5416 Radiculopathy, lumbar region: Secondary | ICD-10-CM | POA: Diagnosis not present

## 2019-06-24 DIAGNOSIS — G20 Parkinson's disease: Secondary | ICD-10-CM | POA: Diagnosis not present

## 2019-06-24 DIAGNOSIS — G894 Chronic pain syndrome: Secondary | ICD-10-CM | POA: Diagnosis not present

## 2019-06-24 DIAGNOSIS — M961 Postlaminectomy syndrome, not elsewhere classified: Secondary | ICD-10-CM | POA: Diagnosis not present

## 2019-06-24 DIAGNOSIS — Z4789 Encounter for other orthopedic aftercare: Secondary | ICD-10-CM | POA: Diagnosis not present

## 2019-06-24 DIAGNOSIS — M419 Scoliosis, unspecified: Secondary | ICD-10-CM | POA: Diagnosis not present

## 2019-06-25 ENCOUNTER — Telehealth: Payer: Self-pay

## 2019-06-25 DIAGNOSIS — Z4789 Encounter for other orthopedic aftercare: Secondary | ICD-10-CM | POA: Diagnosis not present

## 2019-06-25 DIAGNOSIS — G894 Chronic pain syndrome: Secondary | ICD-10-CM | POA: Diagnosis not present

## 2019-06-25 DIAGNOSIS — G20 Parkinson's disease: Secondary | ICD-10-CM | POA: Diagnosis not present

## 2019-06-25 DIAGNOSIS — M419 Scoliosis, unspecified: Secondary | ICD-10-CM | POA: Diagnosis not present

## 2019-06-25 DIAGNOSIS — M961 Postlaminectomy syndrome, not elsewhere classified: Secondary | ICD-10-CM | POA: Diagnosis not present

## 2019-06-25 DIAGNOSIS — M5416 Radiculopathy, lumbar region: Secondary | ICD-10-CM | POA: Diagnosis not present

## 2019-06-25 NOTE — Telephone Encounter (Signed)
-----   Message from Vicie Mutters, [place] sent at 06/24/2019  7:56 AM EDT ----- Regarding: RE: update Contact: [phone] Suggest office visit or if pain is worse to go to the ER.  Estill Bamberg ----- Message ----- From: Elenor Quinones, CMA Sent: 06/23/2019   4:38 PM EDT To: Vicie Mutters, PA-C Subject: update                                         Mrs. Odriscoll reports that she is still not feeling well. Patient states that you told her to call with an update.

## 2019-06-25 NOTE — Telephone Encounter (Signed)
Tell patient to get tested, can go to testing site.  Suggest home isolation until after results.   The testing sites are open from 8-4, Monday-Friday. Due to the testing being walk-up/drive-up the sites request that the pt's are in line to have testing by 3:30 pm. The pt's will remain in the car and wear a mask when going for testing.The staff will come to the car to perform testing. The pt's will need to bring an ID and insurance card if they have one.   [place] [facility]  [facility]   The testing sites are also listed on the [employer] as well.   After negative test suggest office visit for evaluation.  The patient was advised to call immediately if she has any concerning symptoms in the interval. The patient knows to call the clinic with any problems, questions or concerns or go to the ER if any further progression of symptoms.

## 2019-06-25 NOTE — Telephone Encounter (Signed)
Mrs. Alexandra Lindsey states that she is feeling a little better. The pain is still there but not as bad.  Patient was tested for COVID-19 before her surgery was NEG.  Patient had a friend/family member come sit with her Tuesday 06-24-2019  for about 2 hours.  Then today was told by friend/ family that they tested POS for COVID-19. Friend/family had no SXS.  Patient would like to know what should she do?

## 2019-06-26 ENCOUNTER — Other Ambulatory Visit: Payer: Self-pay

## 2019-06-26 DIAGNOSIS — M961 Postlaminectomy syndrome, not elsewhere classified: Secondary | ICD-10-CM | POA: Diagnosis not present

## 2019-06-26 DIAGNOSIS — Z20822 Contact with and (suspected) exposure to covid-19: Secondary | ICD-10-CM

## 2019-06-26 DIAGNOSIS — M5416 Radiculopathy, lumbar region: Secondary | ICD-10-CM | POA: Diagnosis not present

## 2019-06-26 DIAGNOSIS — G894 Chronic pain syndrome: Secondary | ICD-10-CM | POA: Diagnosis not present

## 2019-06-26 DIAGNOSIS — M419 Scoliosis, unspecified: Secondary | ICD-10-CM | POA: Diagnosis not present

## 2019-06-26 DIAGNOSIS — R6889 Other general symptoms and signs: Secondary | ICD-10-CM | POA: Diagnosis not present

## 2019-06-26 DIAGNOSIS — Z4789 Encounter for other orthopedic aftercare: Secondary | ICD-10-CM | POA: Diagnosis not present

## 2019-06-26 DIAGNOSIS — G20 Parkinson's disease: Secondary | ICD-10-CM | POA: Diagnosis not present

## 2019-06-26 NOTE — Telephone Encounter (Signed)
Patient has been made aware.

## 2019-06-27 DIAGNOSIS — M5416 Radiculopathy, lumbar region: Secondary | ICD-10-CM | POA: Diagnosis not present

## 2019-06-27 DIAGNOSIS — Z4789 Encounter for other orthopedic aftercare: Secondary | ICD-10-CM | POA: Diagnosis not present

## 2019-06-27 DIAGNOSIS — M419 Scoliosis, unspecified: Secondary | ICD-10-CM | POA: Diagnosis not present

## 2019-06-27 DIAGNOSIS — G894 Chronic pain syndrome: Secondary | ICD-10-CM | POA: Diagnosis not present

## 2019-06-27 DIAGNOSIS — Z981 Arthrodesis status: Secondary | ICD-10-CM | POA: Diagnosis not present

## 2019-06-27 DIAGNOSIS — Z9181 History of falling: Secondary | ICD-10-CM | POA: Diagnosis not present

## 2019-06-27 DIAGNOSIS — G20 Parkinson's disease: Secondary | ICD-10-CM | POA: Diagnosis not present

## 2019-06-27 DIAGNOSIS — M961 Postlaminectomy syndrome, not elsewhere classified: Secondary | ICD-10-CM | POA: Diagnosis not present

## 2019-06-28 DIAGNOSIS — G20 Parkinson's disease: Secondary | ICD-10-CM | POA: Diagnosis not present

## 2019-06-28 DIAGNOSIS — G894 Chronic pain syndrome: Secondary | ICD-10-CM | POA: Diagnosis not present

## 2019-06-28 DIAGNOSIS — Z4789 Encounter for other orthopedic aftercare: Secondary | ICD-10-CM | POA: Diagnosis not present

## 2019-06-28 DIAGNOSIS — M5416 Radiculopathy, lumbar region: Secondary | ICD-10-CM | POA: Diagnosis not present

## 2019-06-28 DIAGNOSIS — M419 Scoliosis, unspecified: Secondary | ICD-10-CM | POA: Diagnosis not present

## 2019-06-28 DIAGNOSIS — M961 Postlaminectomy syndrome, not elsewhere classified: Secondary | ICD-10-CM | POA: Diagnosis not present

## 2019-06-29 LAB — NOVEL CORONAVIRUS, NAA: SARS-CoV-2, NAA: NOT DETECTED

## 2019-06-30 ENCOUNTER — Other Ambulatory Visit: Payer: Self-pay

## 2019-06-30 DIAGNOSIS — M5416 Radiculopathy, lumbar region: Secondary | ICD-10-CM | POA: Diagnosis not present

## 2019-06-30 DIAGNOSIS — Z4789 Encounter for other orthopedic aftercare: Secondary | ICD-10-CM | POA: Diagnosis not present

## 2019-06-30 DIAGNOSIS — M961 Postlaminectomy syndrome, not elsewhere classified: Secondary | ICD-10-CM | POA: Diagnosis not present

## 2019-06-30 DIAGNOSIS — G20 Parkinson's disease: Secondary | ICD-10-CM | POA: Diagnosis not present

## 2019-06-30 DIAGNOSIS — M419 Scoliosis, unspecified: Secondary | ICD-10-CM | POA: Diagnosis not present

## 2019-06-30 DIAGNOSIS — G894 Chronic pain syndrome: Secondary | ICD-10-CM | POA: Diagnosis not present

## 2019-07-01 ENCOUNTER — Encounter: Payer: Self-pay | Admitting: Neurology

## 2019-07-01 ENCOUNTER — Other Ambulatory Visit: Payer: Medicare Other

## 2019-07-01 ENCOUNTER — Other Ambulatory Visit: Payer: Self-pay

## 2019-07-01 ENCOUNTER — Telehealth (INDEPENDENT_AMBULATORY_CARE_PROVIDER_SITE_OTHER): Payer: Medicare Other | Admitting: Neurology

## 2019-07-01 VITALS — BP 110/62 | Ht 59.0 in | Wt 135.0 lb

## 2019-07-01 DIAGNOSIS — Z4789 Encounter for other orthopedic aftercare: Secondary | ICD-10-CM | POA: Diagnosis not present

## 2019-07-01 DIAGNOSIS — M5416 Radiculopathy, lumbar region: Secondary | ICD-10-CM | POA: Diagnosis not present

## 2019-07-01 DIAGNOSIS — G903 Multi-system degeneration of the autonomic nervous system: Secondary | ICD-10-CM | POA: Diagnosis not present

## 2019-07-01 DIAGNOSIS — G20 Parkinson's disease: Secondary | ICD-10-CM | POA: Diagnosis not present

## 2019-07-01 DIAGNOSIS — G20A1 Parkinson's disease without dyskinesia, without mention of fluctuations: Secondary | ICD-10-CM

## 2019-07-01 DIAGNOSIS — M961 Postlaminectomy syndrome, not elsewhere classified: Secondary | ICD-10-CM | POA: Diagnosis not present

## 2019-07-01 DIAGNOSIS — M419 Scoliosis, unspecified: Secondary | ICD-10-CM | POA: Diagnosis not present

## 2019-07-01 DIAGNOSIS — G894 Chronic pain syndrome: Secondary | ICD-10-CM | POA: Diagnosis not present

## 2019-07-02 ENCOUNTER — Telehealth: Payer: Self-pay

## 2019-07-02 DIAGNOSIS — M419 Scoliosis, unspecified: Secondary | ICD-10-CM | POA: Diagnosis not present

## 2019-07-02 DIAGNOSIS — G20 Parkinson's disease: Secondary | ICD-10-CM | POA: Diagnosis not present

## 2019-07-02 DIAGNOSIS — G894 Chronic pain syndrome: Secondary | ICD-10-CM | POA: Diagnosis not present

## 2019-07-02 DIAGNOSIS — Z4789 Encounter for other orthopedic aftercare: Secondary | ICD-10-CM | POA: Diagnosis not present

## 2019-07-02 DIAGNOSIS — M961 Postlaminectomy syndrome, not elsewhere classified: Secondary | ICD-10-CM | POA: Diagnosis not present

## 2019-07-02 DIAGNOSIS — M5416 Radiculopathy, lumbar region: Secondary | ICD-10-CM | POA: Diagnosis not present

## 2019-07-02 NOTE — Telephone Encounter (Signed)
-----   Message from Vicie Mutters, [place] sent at 07/02/2019 10:50 AM EDT ----- Regarding: RE: results Contact: [phone] Negative ----- Message ----- From: Elenor Quinones, CMA Sent: 07/02/2019  10:19 AM EDT To: Vicie Mutters, PA-C Subject: results                                        Per office note:  Alexandra Lindsey & Alexandra Lindsey report that they had a COVID test done 7 days ago & are requesting their results from that test.

## 2019-07-02 NOTE — Telephone Encounter (Signed)
Patient has been made aware of COVID test results.

## 2019-07-05 DIAGNOSIS — M961 Postlaminectomy syndrome, not elsewhere classified: Secondary | ICD-10-CM | POA: Diagnosis not present

## 2019-07-05 DIAGNOSIS — Z4789 Encounter for other orthopedic aftercare: Secondary | ICD-10-CM | POA: Diagnosis not present

## 2019-07-05 DIAGNOSIS — M419 Scoliosis, unspecified: Secondary | ICD-10-CM | POA: Diagnosis not present

## 2019-07-05 DIAGNOSIS — M5416 Radiculopathy, lumbar region: Secondary | ICD-10-CM | POA: Diagnosis not present

## 2019-07-05 DIAGNOSIS — G20 Parkinson's disease: Secondary | ICD-10-CM | POA: Diagnosis not present

## 2019-07-05 DIAGNOSIS — G894 Chronic pain syndrome: Secondary | ICD-10-CM | POA: Diagnosis not present

## 2019-07-07 DIAGNOSIS — M5416 Radiculopathy, lumbar region: Secondary | ICD-10-CM | POA: Diagnosis not present

## 2019-07-07 DIAGNOSIS — G20 Parkinson's disease: Secondary | ICD-10-CM | POA: Diagnosis not present

## 2019-07-07 DIAGNOSIS — M961 Postlaminectomy syndrome, not elsewhere classified: Secondary | ICD-10-CM | POA: Diagnosis not present

## 2019-07-07 DIAGNOSIS — M419 Scoliosis, unspecified: Secondary | ICD-10-CM | POA: Diagnosis not present

## 2019-07-07 DIAGNOSIS — Z4789 Encounter for other orthopedic aftercare: Secondary | ICD-10-CM | POA: Diagnosis not present

## 2019-07-07 DIAGNOSIS — G894 Chronic pain syndrome: Secondary | ICD-10-CM | POA: Diagnosis not present

## 2019-07-08 ENCOUNTER — Telehealth: Payer: Self-pay | Admitting: *Deleted

## 2019-07-08 DIAGNOSIS — Z981 Arthrodesis status: Secondary | ICD-10-CM | POA: Insufficient documentation

## 2019-07-08 DIAGNOSIS — Z9889 Other specified postprocedural states: Secondary | ICD-10-CM | POA: Diagnosis not present

## 2019-07-08 DIAGNOSIS — Z4789 Encounter for other orthopedic aftercare: Secondary | ICD-10-CM | POA: Diagnosis not present

## 2019-07-08 NOTE — Telephone Encounter (Signed)
Alexandra Lindsey, from [facility], called and reported the patient is more depressed since her 05/2019 back surgery. The patient currently takes Sertraline 100 mg.  Per Dr Melford Aase, she can increase the Sertraline 100 mg to 1.5 tablets daily.  The patient is aware and will increase the medication and call back with the outcome.

## 2019-07-09 DIAGNOSIS — M961 Postlaminectomy syndrome, not elsewhere classified: Secondary | ICD-10-CM | POA: Diagnosis not present

## 2019-07-09 DIAGNOSIS — M5416 Radiculopathy, lumbar region: Secondary | ICD-10-CM | POA: Diagnosis not present

## 2019-07-09 DIAGNOSIS — G20 Parkinson's disease: Secondary | ICD-10-CM | POA: Diagnosis not present

## 2019-07-09 DIAGNOSIS — Z4789 Encounter for other orthopedic aftercare: Secondary | ICD-10-CM | POA: Diagnosis not present

## 2019-07-09 DIAGNOSIS — G894 Chronic pain syndrome: Secondary | ICD-10-CM | POA: Diagnosis not present

## 2019-07-09 DIAGNOSIS — M419 Scoliosis, unspecified: Secondary | ICD-10-CM | POA: Diagnosis not present

## 2019-07-10 ENCOUNTER — Other Ambulatory Visit: Payer: Self-pay | Admitting: Neurology

## 2019-07-11 NOTE — Telephone Encounter (Signed)
Requested Prescriptions   Pending Prescriptions Disp Refills  . carbidopa-levodopa (SINEMET IR) 25-100 MG tablet [Pharmacy Med Name: CARBIDOPA-LEVODOPA 25-100 MG TAB] 300 tablet 1    Sig: TAKE 3 TABLETS AT 5AM, TAKE 2 TABLETS AT9AM, TAKE 1 TABLET AT 1PM, TAKE 2 TABLETS AT 5PM, AND TAKE 1 TABLET AT NIGHT   Rx last filled:04/08/19 #300 1 refills  Pt last seen:07/01/19   Follow up appt scheduled: 12/12/19

## 2019-07-12 DIAGNOSIS — M5416 Radiculopathy, lumbar region: Secondary | ICD-10-CM | POA: Diagnosis not present

## 2019-07-12 DIAGNOSIS — G894 Chronic pain syndrome: Secondary | ICD-10-CM | POA: Diagnosis not present

## 2019-07-12 DIAGNOSIS — G20 Parkinson's disease: Secondary | ICD-10-CM | POA: Diagnosis not present

## 2019-07-12 DIAGNOSIS — M961 Postlaminectomy syndrome, not elsewhere classified: Secondary | ICD-10-CM | POA: Diagnosis not present

## 2019-07-12 DIAGNOSIS — Z4789 Encounter for other orthopedic aftercare: Secondary | ICD-10-CM | POA: Diagnosis not present

## 2019-07-12 DIAGNOSIS — M419 Scoliosis, unspecified: Secondary | ICD-10-CM | POA: Diagnosis not present

## 2019-07-14 DIAGNOSIS — M419 Scoliosis, unspecified: Secondary | ICD-10-CM | POA: Diagnosis not present

## 2019-07-14 DIAGNOSIS — G20 Parkinson's disease: Secondary | ICD-10-CM | POA: Diagnosis not present

## 2019-07-14 DIAGNOSIS — M5416 Radiculopathy, lumbar region: Secondary | ICD-10-CM | POA: Diagnosis not present

## 2019-07-14 DIAGNOSIS — G894 Chronic pain syndrome: Secondary | ICD-10-CM | POA: Diagnosis not present

## 2019-07-14 DIAGNOSIS — Z4789 Encounter for other orthopedic aftercare: Secondary | ICD-10-CM | POA: Diagnosis not present

## 2019-07-14 DIAGNOSIS — M961 Postlaminectomy syndrome, not elsewhere classified: Secondary | ICD-10-CM | POA: Diagnosis not present

## 2019-07-15 ENCOUNTER — Other Ambulatory Visit: Payer: Medicare Other

## 2019-07-16 DIAGNOSIS — Z4789 Encounter for other orthopedic aftercare: Secondary | ICD-10-CM | POA: Diagnosis not present

## 2019-07-16 DIAGNOSIS — M419 Scoliosis, unspecified: Secondary | ICD-10-CM | POA: Diagnosis not present

## 2019-07-16 DIAGNOSIS — G894 Chronic pain syndrome: Secondary | ICD-10-CM | POA: Diagnosis not present

## 2019-07-16 DIAGNOSIS — M961 Postlaminectomy syndrome, not elsewhere classified: Secondary | ICD-10-CM | POA: Diagnosis not present

## 2019-07-16 DIAGNOSIS — M5416 Radiculopathy, lumbar region: Secondary | ICD-10-CM | POA: Diagnosis not present

## 2019-07-16 DIAGNOSIS — G20 Parkinson's disease: Secondary | ICD-10-CM | POA: Diagnosis not present

## 2019-07-19 DIAGNOSIS — M5416 Radiculopathy, lumbar region: Secondary | ICD-10-CM | POA: Diagnosis not present

## 2019-07-19 DIAGNOSIS — M961 Postlaminectomy syndrome, not elsewhere classified: Secondary | ICD-10-CM | POA: Diagnosis not present

## 2019-07-19 DIAGNOSIS — M419 Scoliosis, unspecified: Secondary | ICD-10-CM | POA: Diagnosis not present

## 2019-07-19 DIAGNOSIS — G894 Chronic pain syndrome: Secondary | ICD-10-CM | POA: Diagnosis not present

## 2019-07-19 DIAGNOSIS — Z4789 Encounter for other orthopedic aftercare: Secondary | ICD-10-CM | POA: Diagnosis not present

## 2019-07-19 DIAGNOSIS — G20 Parkinson's disease: Secondary | ICD-10-CM | POA: Diagnosis not present

## 2019-07-21 DIAGNOSIS — M419 Scoliosis, unspecified: Secondary | ICD-10-CM | POA: Diagnosis not present

## 2019-07-21 DIAGNOSIS — Z4789 Encounter for other orthopedic aftercare: Secondary | ICD-10-CM | POA: Diagnosis not present

## 2019-07-21 DIAGNOSIS — G894 Chronic pain syndrome: Secondary | ICD-10-CM | POA: Diagnosis not present

## 2019-07-21 DIAGNOSIS — M961 Postlaminectomy syndrome, not elsewhere classified: Secondary | ICD-10-CM | POA: Diagnosis not present

## 2019-07-21 DIAGNOSIS — G20 Parkinson's disease: Secondary | ICD-10-CM | POA: Diagnosis not present

## 2019-07-21 DIAGNOSIS — M5416 Radiculopathy, lumbar region: Secondary | ICD-10-CM | POA: Diagnosis not present

## 2019-07-23 DIAGNOSIS — G20 Parkinson's disease: Secondary | ICD-10-CM | POA: Diagnosis not present

## 2019-07-23 DIAGNOSIS — M5416 Radiculopathy, lumbar region: Secondary | ICD-10-CM | POA: Diagnosis not present

## 2019-07-23 DIAGNOSIS — M419 Scoliosis, unspecified: Secondary | ICD-10-CM | POA: Diagnosis not present

## 2019-07-23 DIAGNOSIS — G894 Chronic pain syndrome: Secondary | ICD-10-CM | POA: Diagnosis not present

## 2019-07-23 DIAGNOSIS — M961 Postlaminectomy syndrome, not elsewhere classified: Secondary | ICD-10-CM | POA: Diagnosis not present

## 2019-07-23 DIAGNOSIS — Z4789 Encounter for other orthopedic aftercare: Secondary | ICD-10-CM | POA: Diagnosis not present

## 2019-07-24 ENCOUNTER — Ambulatory Visit
Admission: RE | Admit: 2019-07-24 | Discharge: 2019-07-24 | Disposition: A | Discharge status: Home / Self Care | Payer: Medicare Other | Source: Ambulatory Visit | Attending: Physician Assistant | Admitting: Physician Assistant

## 2019-07-24 DIAGNOSIS — R109 Unspecified abdominal pain: Secondary | ICD-10-CM

## 2019-07-24 DIAGNOSIS — N281 Cyst of kidney, acquired: Secondary | ICD-10-CM | POA: Diagnosis not present

## 2019-07-24 DIAGNOSIS — R140 Abdominal distension (gaseous): Secondary | ICD-10-CM

## 2019-07-26 DIAGNOSIS — M961 Postlaminectomy syndrome, not elsewhere classified: Secondary | ICD-10-CM | POA: Diagnosis not present

## 2019-07-26 DIAGNOSIS — G20 Parkinson's disease: Secondary | ICD-10-CM | POA: Diagnosis not present

## 2019-07-26 DIAGNOSIS — G894 Chronic pain syndrome: Secondary | ICD-10-CM | POA: Diagnosis not present

## 2019-07-26 DIAGNOSIS — M419 Scoliosis, unspecified: Secondary | ICD-10-CM | POA: Diagnosis not present

## 2019-07-26 DIAGNOSIS — M5416 Radiculopathy, lumbar region: Secondary | ICD-10-CM | POA: Diagnosis not present

## 2019-07-26 DIAGNOSIS — Z4789 Encounter for other orthopedic aftercare: Secondary | ICD-10-CM | POA: Diagnosis not present

## 2019-07-28 ENCOUNTER — Telehealth: Payer: Self-pay

## 2019-07-28 NOTE — Telephone Encounter (Signed)
Patient has been informed of U/S results.  Per provider:         NORMAL ultrasound, it did show fatty liver. But this can be normal with age & showed NO other issues.  Patient voiced understanding & did state that she is still having some back pain, but she has had 4 back surgeries in 3 years per patient.  Patient was offered a appt to follow up with back pain but she states that she will hold off on scheduling an appt, in hopes that things will get better.

## 2019-07-29 IMAGING — CR DG LUMBAR SPINE 2-3V
2 series · 2 of 2 positions shown · non-contrast
Comparison: MRI 08/07/2017

CLINICAL DATA: Preop.  Decompression L2-3

EXAM:
LUMBAR SPINE - 2-3 VIEW

[xtable lateral (1 of 2)]
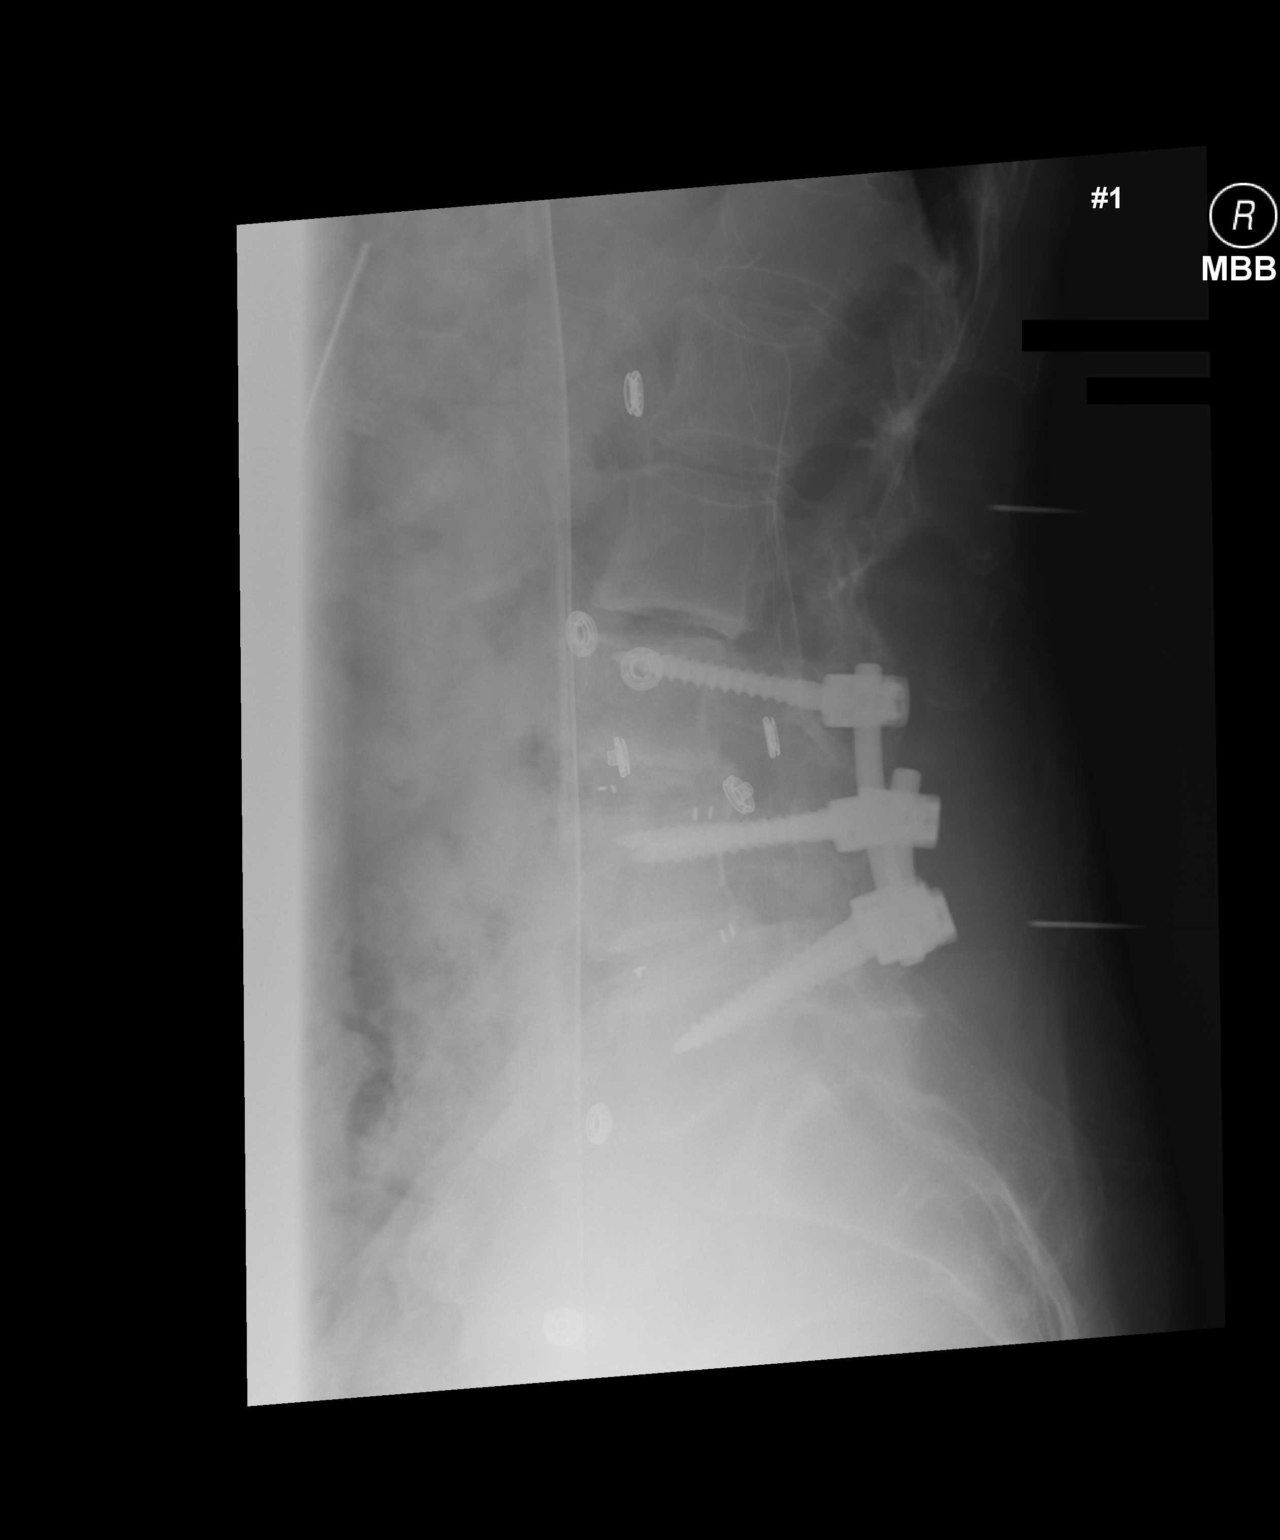

[xtable lateral (2 of 2)]
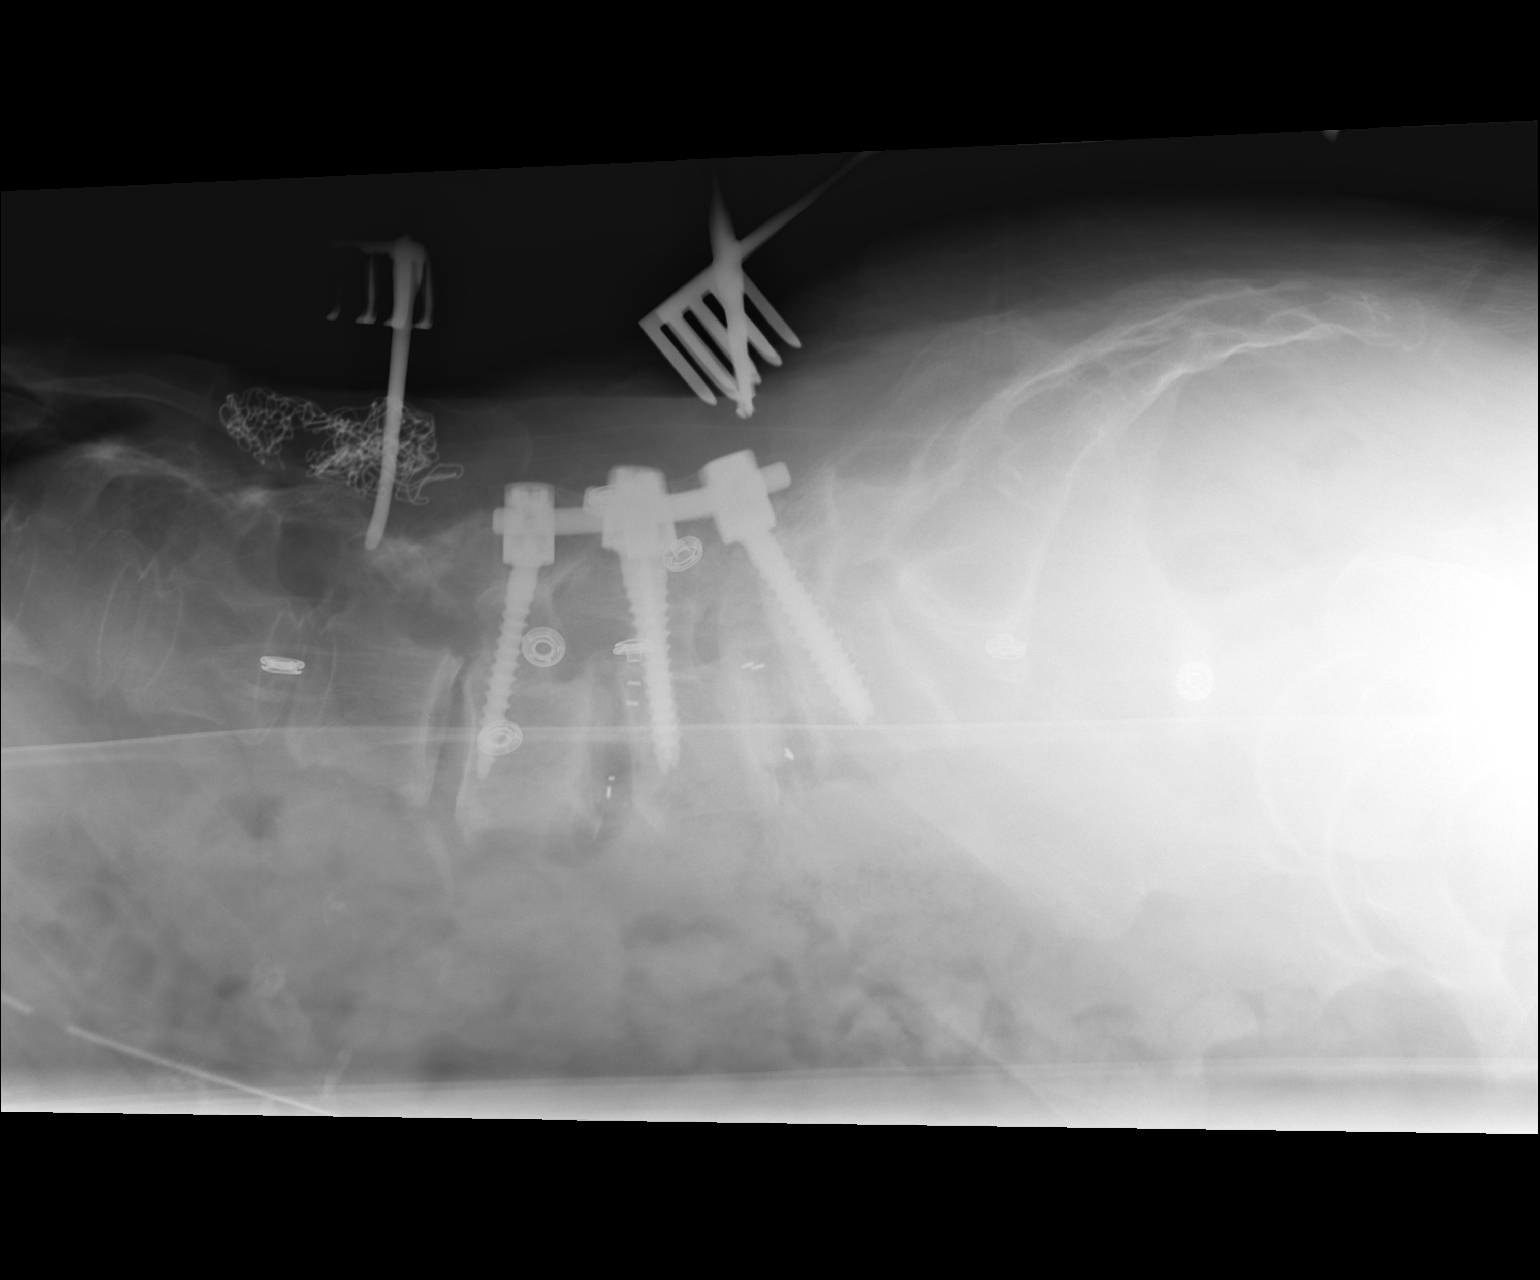

[2 of 2 positions shown; findings below may reference images not displayed]

FINDINGS: Posterior fusion changes from L3-L5. Posterior needles are directed
at the L1-2 level and L4-5 level.
IMPRESSION: Intraoperative localization as above.

## 2019-08-06 ENCOUNTER — Telehealth: Payer: Self-pay | Admitting: Neurology

## 2019-08-06 NOTE — Telephone Encounter (Signed)
Patient left a VM and needs to speak to someone about her having hallucinations  Please call

## 2019-08-06 NOTE — Telephone Encounter (Signed)
Pt called with c/o:  confusion/hallucinations New medications?  No. When did they start?  N/A If hallucinations are new, has patient been checked for infection, including UTI?  No. Current medications prescribed by Dr. Carles Collet and TIMES taking the medications: Carbidopa-Levodopa 25/100 3 at 5 AM, 2 9AM, 1 at 1PM, 2 at 5PM Pramipexole  0.5 mg TID 6AM/12PM/ 6PM  Pt said hallucinations started after her last visit she has been seeing people in her house out and the woods she notice 4 people standing in her dinning room yesterday. Hallucinations are getting worst in more often.

## 2019-08-07 NOTE — Telephone Encounter (Signed)
Called patient to give her instructions for weaning off pramipexole no answer left message to call office back to discuss.

## 2019-08-07 NOTE — Telephone Encounter (Signed)
Weaning schedule for pramipexole below -Discussed again with the patient that she needs to follow the weaning schedule I had previously given her for pramipexole.  Discussed that it is never worth sacrificing cognition for motor function, but I also am not sure that the pramipexole is helping that much.  She is to decreasepramipexole, 0.5 mg, 1 po bid for 1 week then 1 po q day for a week and then stop the medication. She is having hallucinations. If no help, may need to try nuplazid

## 2019-08-07 NOTE — Telephone Encounter (Signed)
See last visit note.  Hallucinations are not new.  I have told her several times now to wean off pramipexole because of the hallucinations and she keeps going back on it.  Give her weaning schedule again.  Please cancel refills at the pharmacy.

## 2019-08-08 NOTE — Telephone Encounter (Signed)
Called spoke with patient she was informed of provider response she was given instructions to wean of pramipexole. She understands and aware. She will start weaning off today.

## 2019-09-03 DIAGNOSIS — S199XXA Unspecified injury of neck, initial encounter: Secondary | ICD-10-CM | POA: Diagnosis not present

## 2019-09-03 DIAGNOSIS — M4325 Fusion of spine, thoracolumbar region: Secondary | ICD-10-CM | POA: Diagnosis not present

## 2019-09-03 DIAGNOSIS — R52 Pain, unspecified: Secondary | ICD-10-CM | POA: Diagnosis not present

## 2019-09-03 DIAGNOSIS — M961 Postlaminectomy syndrome, not elsewhere classified: Secondary | ICD-10-CM | POA: Diagnosis not present

## 2019-09-04 DIAGNOSIS — Z23 Encounter for immunization: Secondary | ICD-10-CM | POA: Diagnosis not present

## 2019-09-15 ENCOUNTER — Telehealth: Payer: Self-pay | Admitting: Neurology

## 2019-09-15 NOTE — Telephone Encounter (Signed)
Make virtual appt and we can discuss

## 2019-09-15 NOTE — Telephone Encounter (Signed)
Patient is calling in about the tamapaxole - she started having hallucinations- she stopped taking that and wanting to know what to do next and what she should take since she is having the hallucinations still. Thanks!

## 2019-09-15 NOTE — Telephone Encounter (Signed)
Yes patient is off of medication but still having hallucinations. Last note said may need to try nuplazid Discuss at appt?

## 2019-09-15 NOTE — Telephone Encounter (Signed)
Per last office note wean off pramipexole if not help may need to try nuplazid. Please advise

## 2019-09-15 NOTE — Telephone Encounter (Signed)
She should be off of that so being out of it should not be an issue, right?

## 2019-09-15 NOTE — Telephone Encounter (Signed)
Yes

## 2019-09-15 NOTE — Telephone Encounter (Signed)
Patient is sch for 09-24-19 and is out of the Pramipexole

## 2019-09-16 DIAGNOSIS — Z981 Arthrodesis status: Secondary | ICD-10-CM | POA: Diagnosis not present

## 2019-09-16 DIAGNOSIS — S22070D Wedge compression fracture of T9-T10 vertebra, subsequent encounter for fracture with routine healing: Secondary | ICD-10-CM | POA: Diagnosis not present

## 2019-09-16 DIAGNOSIS — M4184 Other forms of scoliosis, thoracic region: Secondary | ICD-10-CM | POA: Diagnosis not present

## 2019-09-16 MED ORDER — CARBIDOPA-LEVODOPA 25-100 MG PO TABS: ORAL_TABLET | ORAL | 0 refills | Status: DC

## 2019-09-16 NOTE — Telephone Encounter (Signed)
Patient called and states that she will needs a refill on her Carbidopa levodopa I asked her to call the pharmacy to have them send [place] the request but she states that they sent yesterday and has not heard from [place].   She also needs to know about the pramipexole if she needs to cont to take it. She has appt on 09-24-19.  She uses the [facility] @ [phone]

## 2019-09-16 NOTE — Telephone Encounter (Signed)
Rx sent 

## 2019-09-16 NOTE — Telephone Encounter (Signed)
Requested Prescriptions   Pending Prescriptions Disp Refills  . carbidopa-levodopa (SINEMET IR) 25-100 MG tablet 300 tablet 1    Sig: 3 tablets at 5:00 am, 2 tablets at 9:00 am, 1 tablet at 1:00 pm, 2 tablets at 5:00 pm and one at night.   Rx last filled:07/11/19 #300  1 refills  Pt last seen:07/11/19  Follow up appt scheduled:09/24/19

## 2019-09-16 NOTE — Progress Notes (Signed)
Assessment and Plan:  Tynlee was seen today for bloated.  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) and poor oral fluid intake Has completed PT without improvement Worse since neurology tapered off of mirapex Neuro exam today does not suggest any new etiology, doubt CVA My suspicion is main contributor is advancing Parkinson's, poor understanding of her diagnosis which we reviewed at length today  Discussed at length with patient, she declines a wheelchair today, she plans to measure home and will get back to me when ready Also discussed with husband present, emphasized she needs to avoid any over the head reaching, recommend he be present to assist with ambulation if she continues to decline wheelchair  Hypotension, unspecified hypotension type Encouraged increase compliance with midodrine, try 1/2 tab TID if hypertensive with full tab TID Encouraged increased water intake, increase to 3+ bottles of water daily  -     midodrine (PROAMATINE) 10 MG tablet; Take 1/2-1 tab thee times daily for low blood pressure.  Abdominal bloating Does have + cologuard in 2018, apparently was not recommended colonoscopy by her GI provider Currently taking miralax for constipation following surgery; suggested she try an agent to improve motility, senokot suggested in addition to increased water intake If not improving, recommend she follow up with GI to reconsider colonoscopy  She will follow up with GYN for her overdue routine pelvic exam  Normal abdominal exam today   Further disposition pending results of labs. Discussed med's effects and SE's.   Over 45 minutes of exam, counseling, chart review, and critical decision making was performed.   Future Appointments  Date Time Provider [place]  09/17/2019 11:30 AM Liane Comber, NP [facility] None  09/24/2019  8:45 AM [name], Eustace Quail, DO [facility] None  12/12/2019  1:00 PM [name], Eustace Quail, DO [facility] None    ------------------------------------------------------------------------------------------------------------------   HPI BP 90/60   Pulse 81   Temp (!) 97.5 F (36.4 C)   Wt 134 lb (60.8 kg)   SpO2 99%   BMI 27.06 kg/m   78 y.o.female with hx of parkinsons's, lumbar stenosis with neurogenic claudication, labile blood pressure with hypotention presents for evaluation due to frequent falls and concern with abdominal bloating following her last surgery.    She reports since her most recent back surgery on 05/19/2019 by Dr. Atilano Ina at [facility] she has had stomach bloating and distention, does have constipation as well, taking miralax, currently having daily bowel movements taking miralax 3 days a week. Denies gassiness or belching. She does endorse vague dull lower abdominal discomfort that does typically improve after BMs and does resolve fully. Notably she had + cologuard in 2018, apparently followed up with Dr. Amedeo Plenty who did not recommend a colonoscopy.   Denies hx of acid reflux/GERD. She follows GYN Dr. Ulanda Edison, last pelvic 2 years ago, has a hysterectomy.  Ovaries remain. No family history of GYN cancers. Plans to schedule a follow up appointment with Dr. Ulanda Edison soon.   She is also frustrated with frequent falls; on review this is not a new problem; estimates 200 falls in last 3-4 years; complex history including spinal stenosis with claudication, advancing Parkinson's (followed by Dr. Carles Collet). She describes having intermittent weakness with walking, occasionally lightheaded, but mainly falling backwards when reaching above her head. On review Dr. Carles Collet has recommended she use a  wheelchair, has had PT and home evaluation but hasn't pursued wheelchair due to patient resistance.   She has had multiple spinal surgeries,  most recent by Dr. Prince [place] with improvement in back pain but not in fall frequency. She was following with pain management but is now off of hydrocodone, and lyrica was also stopped which she feels has improved fall frequency. She feels falls are worse in the past week, but reports this seems to be since starting to taper off of mirapex per neurology instructions due to having hallucinations. She does currently take sinemet IR 25-100 mg 9 tabs daily.   She has labile BPs, trending towards hypotension and is currently prescribed midodrine 10 mg BID, states she has been advised to increase to TID but is only taking "as needed." She does endorse feeling lightheaded when walking at some times. Denies dizziness with neck extension or rotation. She admits to very poor water intake, 3 small tea cups of tea and 1 small glass of water.   Past Medical History:  Diagnosis Date  . Anemia    as young girl and teenager  . Arthritis   . Chest pain, unspecified   . Constipation   . Costochondritis   . GERD (gastroesophageal reflux disease)   . Headache    ocular migraines  . History of hiatal hernia   . Hyperlipidemia 10-07-12  . Never smoked tobacco   . Parkinson's disease ([place]) 10-07-12  . Parkinson's disease ([place])   . Prediabetes   . 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 Spinal stenosis      Allergies  Allergen Reactions  . Macrobid [Nitrofurantoin Macrocrystal] Swelling and Rash  . Baclofen Other (See Comments)    Kept her awake, affected memory  . Cephalosporins Other (See Comments)    Cannot sleep  . Codeine Nausea Only    dizziness  . Darvocet [Propoxyphene N-Acetaminophen] Nausea And Vomiting  . Diclofenac Rash    The gel caused a rash  . Levaquin [Levofloxacin] Hives  . Penicillins Rash  . Selegiline Hcl Rash  . Tramadol Nausea And Vomiting  . Triamcinolone Itching    Current Outpatient Medications on File Prior to Visit  Medication Sig  . acetaminophen (TYLENOL) 500 MG  tablet Take 1,000 mg by mouth 2 (two) times daily as needed for moderate pain or headache.  . albuterol (PROVENTIL HFA;VENTOLIN HFA) 108 (90 Base) MCG/ACT inhaler Inhale 1-2 puffs into the lungs every 6 (six) hours as needed for wheezing or shortness of breath.  . AMBULATORY NON FORMULARY MEDICATION Lift chair Dx: G20  . carbidopa-levodopa (SINEMET IR) 25-100 MG tablet 3 tablets at 5:00 am, 2 tablets at 9:00 am, 1 tablet at 1:00 pm, 2 tablets at 5:00 pm and one at night.  . Cholecalciferol (VITAMIN D3) 5000 units CAPS Take 5,000 Units by mouth 2 (two) times daily.  . hydrocortisone cream 1 % Apply 1 application topically daily as needed for itching.  . IBUPROFEN PO Take by mouth as needed.  . methocarbamol (ROBAXIN) 500 MG tablet Take by mouth.  . midodrine (PROAMATINE) 10 MG tablet Take 1 tablet (10 mg total) by mouth 2 (two) times daily. (Patient taking differently: Take 10 mg by mouth 3 (three) times daily. )  . omeprazole (PRILOSEC) 40 MG capsule Take 1 capsule (40 mg total) by mouth daily.  . polyethylene glycol (MIRALAX / GLYCOLAX) packet Take 17 g by mouth daily.  . pramipexole (MIRAPEX) 0.5 MG tablet TAKE 1 TABLET THREE TIMES PER DAY  .  pregabalin (LYRICA) 150 MG capsule Take by mouth.  . sertraline (ZOLOFT) 100 MG tablet Take 1/2 to 1 tablet daily for Mood & Chronic Pain   No current facility-administered medications on file prior to visit.     ROS: all negative except above.   Physical Exam:  There were no vitals taken for this visit.  General Appearance: Well nourished, well dressed elder with walker, in no apparent distress. Eyes: PERRLA, EOMs, conjunctiva no swelling or erythema Sinuses: No Frontal/maxillary tenderness ENT/Mouth: Ext aud canals clear, TMs without erythema, bulging. No erythema, swelling, or exudate on post pharynx.  Tonsils not swollen or erythematous. Hearing normal.  Neck: Supple, thyroid normal.  Respiratory: Respiratory effort normal, BS equal  bilaterally without rales, rhonchi, wheezing or stridor.  Cardio: RRR with no MRGs. Brisk peripheral pulses without edema.  Abdomen: Soft, rounded abdomen, + BS.  Non tender, no guarding, rebound, hernias, masses. Lymphatics: Non tender without lymphadenopathy.  Musculoskeletal: Very slow sitting to standing, difficulty to complete without assistance, she does have symmetrical 5/5 strength through lower extremities excepting somewhat weak hip adduction bilaterally, very slow unsteady shuffling gait with walker.  Skin: Warm, dry without rashes, lesions, ecchymosis.  Neuro: Cranial nerves intact. Normal muscle tone, no cerebellar symptoms, slow rapid alternating movements, normal nose to finger, cannot complete heel to shin due to back pain, Cannot stand without assistance. Sensation intact.  Psych: Awake and oriented X 3, normal affect, Insight and Judgment fair.    Izora Ribas, NP 1:39 PM [facility]

## 2019-09-17 ENCOUNTER — Encounter: Payer: Self-pay | Admitting: Adult Health

## 2019-09-17 ENCOUNTER — Ambulatory Visit (INDEPENDENT_AMBULATORY_CARE_PROVIDER_SITE_OTHER): Payer: Medicare Other | Admitting: Adult Health

## 2019-09-17 ENCOUNTER — Telehealth: Payer: Self-pay | Admitting: Neurology

## 2019-09-17 ENCOUNTER — Other Ambulatory Visit: Payer: Self-pay

## 2019-09-17 VITALS — BP 90/60 | HR 81 | Temp 97.5°F | Wt 134.0 lb

## 2019-09-17 DIAGNOSIS — G20 Parkinson's disease: Secondary | ICD-10-CM | POA: Diagnosis not present

## 2019-09-17 DIAGNOSIS — R296 Repeated falls: Secondary | ICD-10-CM

## 2019-09-17 DIAGNOSIS — I959 Hypotension, unspecified: Secondary | ICD-10-CM | POA: Diagnosis not present

## 2019-09-17 DIAGNOSIS — R140 Abdominal distension (gaseous): Secondary | ICD-10-CM

## 2019-09-17 DIAGNOSIS — G20A1 Parkinson's disease without dyskinesia, without mention of fluctuations: Secondary | ICD-10-CM

## 2019-09-17 MED ORDER — MIDODRINE HCL 10 MG PO TABS: ORAL_TABLET | ORAL | 1 refills | Status: DC

## 2019-09-17 NOTE — Telephone Encounter (Signed)
Left message that patient needs to schedule an appointment to discuss medication.

## 2019-09-17 NOTE — Telephone Encounter (Signed)
Can you advise on any recommendations for her

## 2019-09-17 NOTE — Telephone Encounter (Signed)
No.  I need to see her.  She is not supposed to be walking and we have had many conversations about this previously.  She is supposed to be in a WC because of all the falls.

## 2019-09-17 NOTE — Patient Instructions (Addendum)
Please pick up senokot - over the counter medication to help bowels move - may need to stop miralax or take less frequently   If not improving, please follow up with your GI doctor, would recommend you go ahead and discuss getting the colonoscopy due to positive cologuard results  Please increase your water intake  - try to increase to at least 2-3 bottles - gradually increase  This should help blood pressure and light headedness  Please measure home and get back to me regarding wheelchair   Avoid anything that requires reaching above your head   Write a list to take to your appointment with Dr. Carles Collet     Parkinson's Disease Parkinson's disease is a type of movement disorder. It is a long-term condition that gets worse over time (is progressive). Each person with Parkinson's disease is affected differently. This condition limits your ability to control movements and move your body normally. The condition can range from mild to severe. Parkinson's disease tends to get worse slowly over several years. What are the causes? Parkinson's disease results from a loss of brain cells (neurons) that make a brain chemical called dopamine. Dopamine is needed to control movement. As the condition gets worse, neurons make less dopamine. This makes it hard to move or control your movements. The exact cause of the loss of neurons and why they make less dopamine is not known. Factors related to genes and the environment may contribute to the cause of Parkinson's disease. What increases the risk? The following factors may make you more likely to develop this condition:  Being female.  Being age 19 or older.  Having a family history of Parkinson's disease.  Having had a traumatic brain injury.  Having experienced depression.  Having been exposed to toxins, such as pesticides. What are the signs or symptoms? Symptoms of this condition can vary. The main symptoms are related to movement. These  include:  A tremor or shaking while you are resting that you cannot control.  Stiffness in your neck, arms, and legs (rigidity).  Slowing of movement. You may lose facial expressions and have trouble making small movements that are needed to button clothing or brush your teeth.  An abnormal walk. You may walk with short, shuffling steps.  Loss of balance and stability when standing. You may sway, fall backward, and have trouble making turns. Other symptoms include:  Mental or cognitive changes including depression, anxiety, having false beliefs (delusions), or seeing, hearing, or feeling things that do not exist (hallucinations).  Trouble speaking or swallowing.  Changes in bowel or bladder functions including constipation, having to go urgently or frequently, or not being able to control your bowel or bladder.  Changes in sleep habits or trouble sleeping. Parkinson's disease may be graded by severity of your condition as mild, moderate, or advanced. Parkinson's disease progression is different for everyone. You may not progress to the advanced stage.  Mild Parkinson's disease involves: ? Movement problems that do not affect daily activities. ? Movement problems on one side of the body.  Moderate Parkinson's disease involves: ? Movement problems on both sides of the body. ? Slowing of movement. ? Coordination and balance problems.  Advanced Parkinson's disease involves: ? Extreme difficulty walking. ? Inability to live alone safely. ? Signs of dementia, such as having trouble remembering things, doing daily tasks such as getting dressed, and problem solving. How is this diagnosed? This condition is diagnosed by a specialist. A diagnosis may be made based on  symptoms, your medical history, and a physical exam. You may also have brain imaging tests to check for a loss of dopamine-producing areas of the brain. How is this treated? There is no cure for Parkinson's disease. Treatment  focuses on managing your symptoms. Treatment may include:  Medicines. Everyone responds to medicines differently. Your response may change over time. Work with your health care provider to find the best medicines for you.  Speech, occupational, and physical therapy.  Deep brain stimulation surgery to reduce tremors and other involuntary movements. Follow these instructions at home: Medicines  Take over-the-counter and prescription medicines only as told by your health care provider.  Avoid taking medicines that can affect thinking, such as pain or sleeping medicines. Eating and drinking  Follow instructions from your health care provider about eating or drinking restrictions.  Do not drink alcohol. Activity  Talk with your health care provider about if it is safe for you to drive.  Do exercises as told by your health care provider or physical therapist. Lifestyle      Install grab bars and railings in your home to prevent falls.  Do not use any products that contain nicotine or tobacco, such as cigarettes, e-cigarettes, and chewing tobacco. If you need help quitting, ask your health care provider.  Consider joining a support group for people with Parkinson's disease. General instructions  Work with your health care provider to determine what you need help with and what your safety needs are.  Keep all follow-up visits as told by your health care provider, including any visits with a physical therapist, speech therapist, or occupational therapist. This is important. Contact a health care provider if:  Medicines do not help your symptoms.  You are unsteady or have fallen at home.  You need more support to function well at home.  You have trouble swallowing.  You have severe constipation.  You are having problems with side effects from your medicines.  You feel confused, anxious, or depressed. Get help right away if you:  Are injured after a fall.  See or hear  things that are not real.  Cannot swallow without choking.  Have chest pain or trouble breathing.  Do not feel safe at home.  Have thoughts about hurting yourself or others. If you ever feel like you may hurt yourself or others, or have thoughts about taking your own life, get help right away. You can go to your nearest emergency department or call:  Your local emergency services (911 in the [place]  A suicide crisis helpline, such as the [place] at 610-395-9279. This is open 24 hours a day. Summary  Parkinson's disease is a long-term condition that gets worse over time. This condition limits your ability to control your movements and move your body normally.  There is no cure for Parkinson's disease. Treatment focuses on managing your symptoms.  Work with your health care provider to determine what you need help with and what your safety needs are.  Keep all follow-up visits as told by your health care provider, including any visits with a physical therapist, speech therapist, or occupational therapist. This is important. This information is not intended to replace advice given to you by your health care provider. Make sure you discuss any questions you have with your health care provider. Document Released: 11/10/2000 Document Revised: 01/30/2019 Document Reviewed: 01/30/2019 Elsevier Patient Education  2020 [employer].

## 2019-09-17 NOTE — Telephone Encounter (Signed)
Patient would like to know if we can call something in for her to help her until she sees Dr [name] on 09-24-19. She states she is not moving well at all. She uses the [place] at [phone]

## 2019-09-19 ENCOUNTER — Other Ambulatory Visit: Payer: Self-pay | Admitting: Neurology

## 2019-09-22 NOTE — Progress Notes (Signed)
Virtual Visit via Telephone Note The purpose of this virtual visit is to provide medical care while limiting exposure to the novel coronavirus.    Consent was obtained for phone visit:  Yes.   Answered questions that patient had about telehealth interaction:  Yes.   I discussed the limitations, risks, security and privacy concerns of performing an evaluation and management service by telephone. I also discussed with the patient that there may be a patient responsible charge related to this service. The patient expressed understanding and agreed to proceed.  Pt location: Home Physician Location: home Name of referring provider:  Unk Pinto, MD I connected with .Alexandra Lindsey at patients initiation/request on 09/24/2019 at  8:45 AM EDT by telephone and verified that I am speaking with the correct person using two identifiers.  Pt MRN:  [id number] Pt DOB:  Jun 13, 1941  Participants:  Alexandra Lindsey   History of Present Illness:  Patient seen today in follow-up. She is on carbidopa/levodopa 25/100, 3 tablets at 5 AM/2 tablets at 9 AM/1 tablet at 1 PM/2 tablets at 5 PM and 1 tablet at bedtime (she may or may not take the bedtime one - takes it 50% of the time).  She is having trouble walking in the AM.  Last several visits that I have either seen or talk to her, I have told her that she needed to get off of the pramipexole.  She called me in September stating that she was having hallucinations, but she had not weaned off of the medication.  I told her that she needed to do that.  She called me on October 19 stating that she did stop it and was still having the hallucinations.  However, she then called on October 20 asking if she needed to continue the pramipexole.  She reports today that she is on pramipexole three times per day.  "I tried to go off of it for a week and I couldn't move well at all."  "I definitely have hallucinations and there are people in my house and its unbelievable."   She is on midodrine, 10 mg 3 times per day.  This was increased last visit because of orthostasis.  She states that she has been watching her BP.  The readings that she gave me from late august/early sept were better than July but still had some lows.  She is still having some dizziness.     Observations/Objective:   Vitals:   09/24/19 0813  Weight: 134 lb (60.8 kg)  Height: 4' 11.5" (1.511 m)   Speech is fluent and clear.  At the very end of the visit, she got a little bit quiet and said "there is a woman standing in my hallway" (having a hallucination).   Assessment and Plan:   1.  Idiopathic PD.               -continue Carbidopa Levodopa as follows: 3 tablets at 5:00 am, 2 tablets at 9:00 am, 1 tablet at 1:00 pm, 2 tablets at 5:00 pm.  D/c the carbidopa/levodopa 25/100 at bedtime.  May change to start R[place]ary next visit to see if we can give her more on time.  -start carbidopa/levodopa 50/200 at bedtime.  She took this in 2015 but not for long.  She felt that this caused "baby steps" all day, but this doesn't make physiologic sense.               -I have talked with the patient  many, many times about weaning off of the pramipexole but she keeps going back on the medication.  Told her that she should not be on the medication.  She is having hallucinations.  Weaning schedule given today.  Dropped to bid x 2 weeks and then q day for 2 weeks and then stop the medication.  Told her that I would not refill it.  -can discuss nuplazid if still has hallucinations             -Reiterated, as I have the last many visits, that she should not be walking.  She would not have risk of falling if she was not walking.  Discussed this again today                2.  Urinary incontinence and frequency             -doing better in this regard 3.  Back pain             -increase in falls started after back surgery 2 years ago.  She recently had another back surgery at [facility] on May 19, 2019.  She had  postoperative complications including loss of blood/anemia requiring transfusion as well as hypotension. 4.  RBD.             -  She did not want to take the clonazepam, primarily because of its addictive properties.  She did try it for a short period of time.  She reports that she is screaming out some, but has not had any falling out of the bed. 5.  Orthostatic Hypotension             -Increase midodrine, 10 mg 3 times per day.  She is to increase her water.  Follow Up Instructions:  She has a follow-up appointment in January.  -I discussed the assessment and treatment plan with the patient. The patient was provided an opportunity to ask questions and all were answered. The patient agreed with the plan and demonstrated an understanding of the instructions.   The patient was advised to call back or seek an in-person evaluation if the symptoms worsen or if the condition fails to improve as anticipated.    Total Time spent in visit with the patient was:  26 min, of which 100% of the time was spent in counseling .   Pt understands and agrees with the plan of care outlined.     Alonza Bogus, DO

## 2019-09-24 ENCOUNTER — Telehealth (INDEPENDENT_AMBULATORY_CARE_PROVIDER_SITE_OTHER): Payer: Medicare Other | Admitting: Neurology

## 2019-09-24 ENCOUNTER — Other Ambulatory Visit: Payer: Self-pay

## 2019-09-24 ENCOUNTER — Encounter: Payer: Self-pay | Admitting: Neurology

## 2019-09-24 VITALS — Ht 59.5 in | Wt 134.0 lb

## 2019-09-24 DIAGNOSIS — G903 Multi-system degeneration of the autonomic nervous system: Secondary | ICD-10-CM | POA: Diagnosis not present

## 2019-09-24 DIAGNOSIS — G20 Parkinson's disease: Secondary | ICD-10-CM

## 2019-09-24 DIAGNOSIS — R441 Visual hallucinations: Secondary | ICD-10-CM | POA: Diagnosis not present

## 2019-09-24 MED ORDER — CARBIDOPA-LEVODOPA ER 50-200 MG PO TBCR: 1.0000 | EXTENDED_RELEASE_TABLET | Freq: Every day | ORAL | 1 refills | Status: DC

## 2019-09-24 NOTE — Addendum Note (Signed)
Addended by: Ludwig Clarks on: 09/24/2019 09:52 AM   Modules accepted: Level of Service

## 2019-09-25 ENCOUNTER — Telehealth: Payer: Self-pay

## 2019-09-25 NOTE — Telephone Encounter (Signed)
Patient states that at her visit with you that you had wanted some information for you. She has done that and would like to speak to you.  I contacted patient back and she states that she doesn't want to do the colonoscopy and that Dr. Carles Collet thinks it would be beneficial to have a brain scan done. Please advise.

## 2019-09-29 ENCOUNTER — Other Ambulatory Visit: Payer: Self-pay | Admitting: Internal Medicine

## 2019-09-29 DIAGNOSIS — F32A Depression, unspecified: Secondary | ICD-10-CM

## 2019-09-29 DIAGNOSIS — F329 Major depressive disorder, single episode, unspecified: Secondary | ICD-10-CM

## 2019-09-30 NOTE — Telephone Encounter (Signed)
Left message to call back  

## 2019-10-01 NOTE — Telephone Encounter (Signed)
Patient aware about calling Neurologist and plans on calling GI to talk with them about having a colonoscopy done or not. Plans on utilizing MyChart for future communication with our providers. Patient also states that the senokot has been helping.

## 2019-10-06 ENCOUNTER — Telehealth: Payer: Self-pay | Admitting: Adult Health

## 2019-10-06 ENCOUNTER — Encounter: Payer: Self-pay | Admitting: Internal Medicine

## 2019-10-06 ENCOUNTER — Other Ambulatory Visit: Payer: Self-pay | Admitting: Adult Health

## 2019-10-06 DIAGNOSIS — K5900 Constipation, unspecified: Secondary | ICD-10-CM

## 2019-10-06 DIAGNOSIS — R140 Abdominal distension (gaseous): Secondary | ICD-10-CM

## 2019-10-06 DIAGNOSIS — R195 Other fecal abnormalities: Secondary | ICD-10-CM

## 2019-10-06 NOTE — Telephone Encounter (Signed)
Patient called, requesting referral to  [facility]- hx provider Dr Teena Irani retired, requests Dr Ronald Lobo- for colonoscopy. Please advise your recommendation. States she is having stomach pain.

## 2019-10-07 NOTE — Telephone Encounter (Signed)
Alexandra Lindsey has spoken with patient and is aware that referral was put in.

## 2019-10-10 ENCOUNTER — Other Ambulatory Visit: Payer: Self-pay | Admitting: Gastroenterology

## 2019-10-10 DIAGNOSIS — R140 Abdominal distension (gaseous): Secondary | ICD-10-CM | POA: Diagnosis not present

## 2019-10-10 DIAGNOSIS — R195 Other fecal abnormalities: Secondary | ICD-10-CM | POA: Diagnosis not present

## 2019-10-10 DIAGNOSIS — R1084 Generalized abdominal pain: Secondary | ICD-10-CM | POA: Diagnosis not present

## 2019-10-13 ENCOUNTER — Other Ambulatory Visit: Payer: Self-pay | Admitting: Neurology

## 2019-10-13 ENCOUNTER — Ambulatory Visit
Admission: RE | Admit: 2019-10-13 | Discharge: 2019-10-13 | Disposition: A | Discharge status: Home / Self Care | Payer: Medicare Other | Source: Ambulatory Visit | Attending: Gastroenterology | Admitting: Gastroenterology

## 2019-10-13 DIAGNOSIS — K5900 Constipation, unspecified: Secondary | ICD-10-CM | POA: Diagnosis not present

## 2019-10-13 DIAGNOSIS — R1084 Generalized abdominal pain: Secondary | ICD-10-CM

## 2019-10-13 MED ORDER — IOPAMIDOL (ISOVUE-300) INJECTION 61%
100.0000 mL | Freq: Once | INTRAVENOUS | Status: AC | PRN
  Administered 2019-10-13: 100 mL via INTRAVENOUS

## 2019-10-14 ENCOUNTER — Telehealth: Payer: Self-pay | Admitting: Neurology

## 2019-10-14 NOTE — Telephone Encounter (Signed)
She states that she is still having hallucinations and that January is to far out. She also said she does not have a wheelchair

## 2019-10-14 NOTE — Telephone Encounter (Signed)
Patient stated that she already has a PT and they are looking into home renovation to have a wheel chair. She wants to be on Cx list

## 2019-10-14 NOTE — Telephone Encounter (Signed)
We can have PT do a WC evaluation if she is agreeable.  I can put her on a cx list but just saw her 3 weeks ago, and have worked her in the last several visits.

## 2019-10-14 NOTE — Telephone Encounter (Signed)
See below, please advise.

## 2019-10-14 NOTE — Telephone Encounter (Signed)
Patient wants to speak to someone about her having a hard time moving getting out of bed and walking new medication is not working

## 2019-10-14 NOTE — Telephone Encounter (Signed)
We will discuss potentially changing to rytary at her Jan appt BUT she is not supposed to be walking because of many, many falls.  She is supposed to be in a WC at all times (see many prior phone calls).  I believe she has inbrija nasal spray at home for as needed use and she can try that up to 5 times per day for "off" episodes.

## 2019-10-17 DIAGNOSIS — S22070A Wedge compression fracture of T9-T10 vertebra, initial encounter for closed fracture: Secondary | ICD-10-CM | POA: Diagnosis not present

## 2019-10-17 DIAGNOSIS — S22070D Wedge compression fracture of T9-T10 vertebra, subsequent encounter for fracture with routine healing: Secondary | ICD-10-CM | POA: Diagnosis not present

## 2019-10-17 DIAGNOSIS — Z9181 History of falling: Secondary | ICD-10-CM | POA: Diagnosis not present

## 2019-10-27 ENCOUNTER — Telehealth: Payer: Self-pay | Admitting: Neurology

## 2019-10-27 DIAGNOSIS — Z85828 Personal history of other malignant neoplasm of skin: Secondary | ICD-10-CM | POA: Diagnosis not present

## 2019-10-27 DIAGNOSIS — L821 Other seborrheic keratosis: Secondary | ICD-10-CM | POA: Diagnosis not present

## 2019-10-27 NOTE — Telephone Encounter (Signed)
Pt is aware and has no questions or concerns at this time. She just does not want to have the colonoscopy right now

## 2019-10-27 NOTE — Telephone Encounter (Signed)
She has a January follow up.  Doesn't have a may appointment.  However, her PD should have nothing to do with her colonoscopy.

## 2019-10-27 NOTE — Telephone Encounter (Signed)
Patient also wants an appointment sooner than scheduled in may

## 2019-10-27 NOTE — Telephone Encounter (Signed)
Patient is scheduled for a colonoscopy on Friday, 10/27/19. She said she has Parkinson's and she has some concerns about the test. She'd like a call back please.

## 2019-10-27 NOTE — Telephone Encounter (Signed)
Patient has a colonoscopy Friday and she is unsure that with her worsening PD that she can not do this. She is canceling the colonoscopy so this is just an [place]

## 2019-10-27 NOTE — Telephone Encounter (Signed)
Left message to call office back

## 2019-10-28 DIAGNOSIS — H524 Presbyopia: Secondary | ICD-10-CM | POA: Diagnosis not present

## 2019-10-28 DIAGNOSIS — H5203 Hypermetropia, bilateral: Secondary | ICD-10-CM | POA: Diagnosis not present

## 2019-10-28 DIAGNOSIS — H25813 Combined forms of age-related cataract, bilateral: Secondary | ICD-10-CM | POA: Diagnosis not present

## 2019-10-28 DIAGNOSIS — H52223 Regular astigmatism, bilateral: Secondary | ICD-10-CM | POA: Diagnosis not present

## 2019-10-28 DIAGNOSIS — H40013 Open angle with borderline findings, low risk, bilateral: Secondary | ICD-10-CM | POA: Diagnosis not present

## 2019-11-29 ENCOUNTER — Other Ambulatory Visit: Payer: Self-pay | Admitting: Neurology

## 2019-12-11 ENCOUNTER — Encounter: Payer: Self-pay | Admitting: Neurology

## 2019-12-11 NOTE — Progress Notes (Signed)
Virtual Visit via Telephone Note The purpose of this virtual visit is to provide medical care while limiting exposure to the novel coronavirus.    Consent was obtained for phone visit:  Yes.   Answered questions that patient had about telehealth interaction:  Yes.   I discussed the limitations, risks, security and privacy concerns of performing an evaluation and management service by telephone. I also discussed with the patient that there may be a patient responsible charge related to this service. The patient expressed understanding and agreed to proceed.  Pt location: Home Physician Location: home Name of referring provider:  Unk Pinto, MD I connected with .Alexandra Lindsey at patients initiation/request on 12/12/2019 at  1:00 PM EST by telephone and verified that I am speaking with the correct person using two identifiers.  Pt MRN:  [id number] Pt DOB:  04/21/1941  Participants:  Alexandra Lindsey   History of Present Illness:  Patient seen today in follow-up. "I'm doing terrible."  She is on carbidopa/levodopa 25/100, 3 tablets at 5 AM/2 tablets at 9 AM/1 tablet at 1 PM/2 tablets at 5 PM and 1 carbidopa/levodopa 50/200 qhs.  I gave her a weaning schedule again last visit to wean off of the pramipexole.  She does state that she got off of it.  She thinks she is having more trouble moving.  She does have a lift chair.  She has start hesitation.  In regards to hallucinations, she states that are "people in my house."  She is on midodrine, 10 mg tid.  She states that this increased dose has helped but she still has some days where BP is low.  Other days, she skips the medication if her BP is 160-180.  She does have a CNA now 2-3 days per week, 5 hours each time.  She helps with the housework.    Observations/Objective:   Vitals:   12/11/19 1406  Weight: 130 lb (59 kg)  Height: 5' (1.524 m)   Speech is fluent and clear.  At the very end of the visit, she got a little bit quiet and said  "there is a woman standing in my hallway" (having a hallucination).   Assessment and Plan:   1.  Idiopathic PD.               -Decided to try and changed to Rytary to see if she would have a better dose response.  However, I did tell her I still did not want her to be walking independently.  She will need to have her husband right next to her if she is going to be walking.  -Discontinue all forms of levodopa that she currently has, including carbidopa/levodopa 25/100 IR and carbidopa/levodopa 25/100 CR that she is taking at bedtime  -Take Rytary 195 mg, 2 tablets at 5 AM, 2 tablets at 8 AM, 1 tablet at noon, 1 tablet at 4 PM, 1 tablet at 7 PM, 2 tablets at bedtime.  This is a slight dose increase, but not a significant increase.  It looks like a significant increase, but Rytary is less bioavailable than the immediate release.  I told her to let me know if hallucinations increase.  She is to call me before samples run out so that I can know how she did with the Rytary so that I can write her prescription.  -will start nuplazid next visit              -Reiterated, as I have the  last many visits, that she should not be walking.  She would not have risk of falling if she was not walking.  Discussed this again today    -she asked me about a supplement called restore gold and told her not to take it  -recommend WC - she has not gotten it yet              2.  Urinary incontinence and frequency             -doing better in this regard 3.  Back pain             -Multiple back surgeries, the most recent of which was in June, 2020.  She has not recovered well, and her Parkinson's really went downhill after this. 4.  RBD.             -  She did not want to take the clonazepam, primarily because of its addictive properties.  She did try it for a short period of time.  She reports that she is screaming out some, but has not had any falling out of the bed. 5.  Orthostatic Hypotension             -Continue  midodrine, 10 mg 3 times per day  Follow Up Instructions: We will work her in for a visit in the next 8 weeks (March 18)  -I discussed the assessment and treatment plan with the patient. The patient was provided an opportunity to ask questions and all were answered. The patient agreed with the plan and demonstrated an understanding of the instructions.   The patient was advised to call back or seek an in-person evaluation if the symptoms worsen or if the condition fails to improve as anticipated.    Total time spent on today's visit was 23 minutes Alexandra Guiles Kamoria Lucien, DO

## 2019-12-12 ENCOUNTER — Telehealth (INDEPENDENT_AMBULATORY_CARE_PROVIDER_SITE_OTHER): Payer: Medicare Other | Admitting: Neurology

## 2019-12-12 ENCOUNTER — Other Ambulatory Visit: Payer: Self-pay

## 2019-12-12 VITALS — Ht 60.0 in | Wt 130.0 lb

## 2019-12-12 DIAGNOSIS — G20 Parkinson's disease: Secondary | ICD-10-CM | POA: Diagnosis not present

## 2019-12-12 DIAGNOSIS — R441 Visual hallucinations: Secondary | ICD-10-CM | POA: Diagnosis not present

## 2019-12-12 NOTE — Patient Instructions (Signed)
1.  STOP daytime and nighttime levodopa that you have 2.  Take Rytary 195 mg samples, 2 capsules at 5 AM, 2 capsules at 8 AM, 1 capsule at noon, 1 capsule at 4 PM, 1capsule  at 7 PM, 2 capsule  at bedtime.   3.  Watch and let me know if you have more hallucinations 4.  Call me for the RX for rytary if doing well as I will need to fight with the insurance company for this.  Don't wait until your samples run out! 5.  Next visit we will likely need to start medication for the hallucinations.

## 2019-12-16 ENCOUNTER — Telehealth: Payer: Self-pay | Admitting: Neurology

## 2019-12-16 NOTE — Telephone Encounter (Signed)
Patient stated that she has been taking this medication since 4pm on Friday. She said that she is "horrible" and nervous/jittery but moving slowly. She has weak legs and just not feeling well at all. She also states her " bowels are not moving and stomach is swollen" Please advise

## 2019-12-16 NOTE — Telephone Encounter (Signed)
I'm not sure that answered the question about how she is doing compared to when she was on the other medication (she said she was doing "horrible" on the other one as well).  Does she want to change back to the other version.  There are really very limited options for her unfortunately

## 2019-12-16 NOTE — Telephone Encounter (Signed)
Patient called to report she is not doing well on Rytari. She is feeling shaky and having pain in her back.  [facility] [phone]

## 2019-12-16 NOTE — Telephone Encounter (Signed)
Please advise on below  

## 2019-12-16 NOTE — Telephone Encounter (Signed)
It was not intended to help her back pain.  Is she doing LESS well than she was before.  It is a higher dose of levodopa than she was on.

## 2019-12-17 NOTE — Telephone Encounter (Signed)
Patient stated that she is just so horrible and has trouble even talking. I asked if she wanted to revert to her old medication regimen. She said she would call back and let us know what she wanted to do because right now she is so unsure.

## 2019-12-17 NOTE — Telephone Encounter (Signed)
Okay.  I'm happy to refer her for another opinion if she can get out of town.  I'm not sure I have other good options.  Unfortunately, the wait to get to movement at [facility] is over a year now.  We could try [facility] or [facility] if she would like

## 2019-12-17 NOTE — Telephone Encounter (Signed)
Patient has decided she does want to revert to previous regimen she feels the medication is to strong

## 2019-12-22 NOTE — Telephone Encounter (Signed)
Will discuss med for hallucination at her visit in 6 weeks.  Don't think that hand specialist will help but she can ask PCP.  I want to stress to her that I am mostly out of options for her which is why I wanted her to consider opinions elsewhere.

## 2019-12-22 NOTE — Telephone Encounter (Signed)
Pt is aware will call PCP. Declines second opinion still.

## 2019-12-22 NOTE — Telephone Encounter (Signed)
Patient stated that she is back on her previous regiman. She said her left hand is cramping up in the fingers and she is having to manually pull her fingers with other hand. She does not want to travel to [facility] or [facility] and does not want to wait for [facility]. She would like to know if you feel she should see a hand specialist? She also wants to know if you can put her on something for hallucinations?

## 2019-12-23 ENCOUNTER — Encounter: Payer: Self-pay | Admitting: Adult Health

## 2019-12-23 ENCOUNTER — Other Ambulatory Visit: Payer: Self-pay

## 2019-12-23 ENCOUNTER — Ambulatory Visit (INDEPENDENT_AMBULATORY_CARE_PROVIDER_SITE_OTHER): Payer: Medicare Other | Admitting: Adult Health

## 2019-12-23 VITALS — BP 110/72 | HR 79 | Temp 97.7°F | Wt 130.0 lb

## 2019-12-23 DIAGNOSIS — K219 Gastro-esophageal reflux disease without esophagitis: Secondary | ICD-10-CM | POA: Diagnosis not present

## 2019-12-23 DIAGNOSIS — R6889 Other general symptoms and signs: Secondary | ICD-10-CM | POA: Diagnosis not present

## 2019-12-23 DIAGNOSIS — G894 Chronic pain syndrome: Secondary | ICD-10-CM

## 2019-12-23 DIAGNOSIS — Z79899 Other long term (current) drug therapy: Secondary | ICD-10-CM

## 2019-12-23 DIAGNOSIS — G20 Parkinson's disease: Secondary | ICD-10-CM | POA: Diagnosis not present

## 2019-12-23 DIAGNOSIS — Z0001 Encounter for general adult medical examination with abnormal findings: Secondary | ICD-10-CM | POA: Diagnosis not present

## 2019-12-23 DIAGNOSIS — I10 Essential (primary) hypertension: Secondary | ICD-10-CM

## 2019-12-23 DIAGNOSIS — G4752 REM sleep behavior disorder: Secondary | ICD-10-CM | POA: Diagnosis not present

## 2019-12-23 DIAGNOSIS — F329 Major depressive disorder, single episode, unspecified: Secondary | ICD-10-CM

## 2019-12-23 DIAGNOSIS — M8000XS Age-related osteoporosis with current pathological fracture, unspecified site, sequela: Secondary | ICD-10-CM

## 2019-12-23 DIAGNOSIS — M6530 Trigger finger, unspecified finger: Secondary | ICD-10-CM

## 2019-12-23 DIAGNOSIS — R7309 Other abnormal glucose: Secondary | ICD-10-CM

## 2019-12-23 DIAGNOSIS — Z0000 Encounter for general adult medical examination without abnormal findings: Secondary | ICD-10-CM

## 2019-12-23 DIAGNOSIS — M5416 Radiculopathy, lumbar region: Secondary | ICD-10-CM

## 2019-12-23 DIAGNOSIS — E559 Vitamin D deficiency, unspecified: Secondary | ICD-10-CM

## 2019-12-23 DIAGNOSIS — R296 Repeated falls: Secondary | ICD-10-CM

## 2019-12-23 DIAGNOSIS — Z981 Arthrodesis status: Secondary | ICD-10-CM

## 2019-12-23 DIAGNOSIS — E782 Mixed hyperlipidemia: Secondary | ICD-10-CM

## 2019-12-23 DIAGNOSIS — F32A Depression, unspecified: Secondary | ICD-10-CM

## 2019-12-23 MED ORDER — SERTRALINE HCL 100 MG PO TABS: ORAL_TABLET | ORAL | 3 refills | Status: DC

## 2019-12-23 MED ORDER — PREDNISONE 20 MG PO TABS: ORAL_TABLET | ORAL | 0 refills | Status: DC

## 2019-12-23 NOTE — Patient Instructions (Signed)
Please pick up a hand brace to wear an night   Voltaren gel twice daily on both sides of hands  Prednisone taper   Ice when hot/red/swollen     Trigger Finger  Trigger finger, also called stenosing tenosynovitis,  is a condition that causes a finger to get stuck in a bent position. Each finger has a tendon, which is a tough, cord-like tissue that connects muscle to bone, and each tendon passes through a tunnel of tissue called a tendon sheath. To move your finger, your tendon needs to glide freely through the sheath. Trigger finger happens when the tendon or the sheath thickens, making it difficult to move your finger. Trigger finger can affect any finger or a thumb. It may affect more than one finger. Mild cases may clear up with rest and medicine. Severe cases require more treatment. What are the causes? Trigger finger is caused by a thickened finger tendon or tendon sheath. The cause of this thickening is not known. What increases the risk? The following factors may make you more likely to develop this condition:  Doing activities that require a strong grip.  Having rheumatoid arthritis, gout, or diabetes.  Being 2-50 years old.  Being female. What are the signs or symptoms? Symptoms of this condition include:  Pain when bending or straightening your finger.  Tenderness or swelling where your finger attaches to the palm of your hand.  A lump in the palm of your hand or on the inside of your finger.  Hearing a noise like a pop or a snap when you try to straighten your finger.  Feeling a catching or locking sensation when you try to straighten your finger.  Being unable to straighten your finger. How is this diagnosed? This condition is diagnosed based on your symptoms and a physical exam. How is this treated? This condition may be treated by:  Resting your finger and avoiding activities that make symptoms worse.  Wearing a finger splint to keep your finger  extended.  Taking NSAIDs, such as ibuprofen, to relieve pain and swelling.  Doing gentle exercises to stretch the finger as told by your health care provider.  Having medicine that reduces swelling and inflammation (steroids) injected into the tendon sheath. Injections may need to be repeated.  Having surgery to open the tendon sheath. This may be done if other treatments do not work and you cannot straighten your finger. You may need physical therapy after surgery. Follow these instructions at home: If you have a splint:  Wear the splint as told by your health care provider. Remove it only as told by your health care provider.  Loosen it if your fingers tingle, become numb, or turn cold and blue.  Keep it clean.  If the splint is not waterproof: ? Do not let it get wet. ? Cover it with a watertight covering when you take a bath or shower. Managing pain, stiffness, and swelling     If directed, apply heat to the affected area as often as told by your health care provider. Use the heat source that your health care provider recommends, such as a moist heat pack or a heating pad.  Place a towel between your skin and the heat source.  Leave the heat on for 20-30 minutes.  Remove the heat if your skin turns bright red. This is especially important if you are unable to feel pain, heat, or cold. You may have a greater risk of getting burned. If directed, put ice  on the painful area. To do this:  If you have a removable splint, remove it as told by your health care provider.  Put ice in a plastic bag.  Place a towel between your skin and the bag or between your splint and the bag.  Leave the ice on for 20 minutes, 2-3 times a day.  Activity  Rest your finger as told by your health care provider. Avoid activities that make the pain worse.  Return to your normal activities as told by your health care provider. Ask your health care provider what activities are safe for you.  Do  exercises as told by your health care provider.  Ask your health care provider when it is safe to drive if you have a splint on your hand. General instructions  Take over-the-counter and prescription medicines only as told by your health care provider.  Keep all follow-up visits as told by your health care provider. This is important. Contact a health care provider if:  Your symptoms are not improving with home care. Summary  Trigger finger, also called stenosing tenosynovitis, causes your finger to get stuck in a bent position. This can make it difficult and painful to straighten your finger.  This condition develops when a finger tendon or tendon sheath thickens.  Treatment may include resting your finger, wearing a splint, and taking medicines.  In severe cases, surgery to open the tendon sheath may be needed. This information is not intended to replace advice given to you by your health care provider. Make sure you discuss any questions you have with your health care provider. Document Revised: 03/31/2019 Document Reviewed: 03/31/2019 Elsevier Patient Education  [place].

## 2019-12-23 NOTE — Progress Notes (Signed)
MEDICARE ANNUAL WELLNESS VISIT AND ACUTE VISIT Assessment:    Encounter for Medicare annual wellness exam 1 year  Essential hypertension - continue medications, DASH diet, exercise and monitor at home. Call if greater than 130/80.  -     CBC with Differential/Platelet -     COMPLETE METABOLIC PANEL WITH GFR -     TSH -     Lipid panel  Hyperlipidemia, mixed check lipids decrease fatty foods increase activity.  -     CBC with Differential/Platelet -     COMPLETE METABOLIC PANEL WITH GFR -     TSH -     Lipid panel  Parkinson's disease (HCC) Continue neuro follow up Dr. Carles Collet  Medication management  Positive colorectal cancer screening using Cologuard test Declines further follow up Saw Dr. Amedeo Plenty, no colonoscopy  Lumbar stenosis with neurogenic claudication Continue ortho follow up, going to get injections  Prediabetes Discussed disease progression and risks Discussed diet/exercise, weight management and risk modification  Lumbar radiculopathy Continue ortho follow up, going to get injections  Gastroesophageal reflux disease, esophagitis presence not specified Continue PPI/H2 blocker, diet discussed  SI (sacroiliac) joint dysfunction Continue ortho follow up, going to get injections  Vitamin D deficiency Continue supplement  RBD (REM behavioral disorder) Continue meds  Left hand trigger finger/left hand arthritis Obvious chronic arthritis; left hand 3rd and 4th digit tenosynovitis;  Poor candidate for NSAIDs, has been advised against; can continue voltaren BID After discussion will trial short course of prednisone Advised to use a finger immobilizing hand brace at night to prevent flexion Can monitor; if persistent will consider hand specialist referral, husband and patient decline at this time  Defer all labs to upcoming routine OV  Over 30 minutes of exam, counseling, chart review, and critical decision making was performed  Future Appointments  Date  Time Provider [place]  01/06/2020  9:30 AM Unk Pinto, MD [facility] None  02/12/2020  1:30 PM [name], Eustace Quail, DO [facility] None     Plan:   During the course of the visit the patient was educated and counseled about appropriate screening and preventive services including:    Pneumococcal vaccine   Influenza vaccine  Prevnar 13  Td vaccine  Screening electrocardiogram  Colorectal cancer screening  Diabetes screening  Glaucoma screening  Nutrition counseling    Subjective:  Alexandra Lindsey is a 79 y.o. female who presents for Medicare Annual Wellness Visit and acute visit.   She is accompanied by her husband who is her primary caregiver.   She presents today for 3 weeks of left hand 3rd and 4th digits with mild aching/tenderness and "catching," has to force to extend. She reports has been using voltaren gel BID per pharmacist recommendation which has helped intermittent swelling at joints, but that contractures haven't improved.   Follows closely with Dr. Hall Busing for advancing Parkinson's  Now treated by rytary 195 mg, 2 tablets at 5 AM, 2 tablets at 8 AM, 1 tablet at noon, 1 tablet at 4 PM, 1 tablet at 7 PM, 2 tablets at bedtime.   Off of mirapex due to hallucinations.  She is very unstable r/t Parkinson's with high fall risk, numerous falls with injury, recently transitioned from walker to Wheelchair per neuro recommendations.     She is seeing ortho doctor in [place] for her lower back pain.   She uses a bedside commode. Has shower chair with flexible hose. She is not driving. Husband assists with most activities, and has Environmental consultant  coming for 4 hours in the afternoon 3 days a week for house work. Has done PT, interested in doing more at some point after full transition to wheelchair but declines at this time.   She had a + cologuard saw Dr. Amedeo Plenty, declines further colonoscopy, no blood in stool.   She is seeing ortho doctor in [place] for her lower back  pain.    BMI is Body mass index is 25.39 kg/m., she has not been working on diet and exercise. Wt Readings from Last 3 Encounters:  12/23/19 130 lb (59 kg)  12/11/19 130 lb (59 kg)  09/24/19 134 lb (60.8 kg)   Her blood pressure has been controlled at home, today their BP is BP: 110/72 She does workout. She denies chest pain, shortness of breath, dizziness.   She is not on cholesterol medication and denies myalgias. Her cholesterol is not at goal. The cholesterol last visit was:   Lab Results  Component Value Date   CHOL 204 (H) 08/01/2018   HDL 47 (L) 08/01/2018   LDLCALC 126 (H) 08/01/2018   TRIG 185 (H) 08/01/2018   CHOLHDL 4.3 08/01/2018   :  Lab Results  Component Value Date   HGBA1C 5.4 01/02/2018   Last GFR Lab Results  Component Value Date   GFRNONAA 52 (L) 08/01/2018    Patient is on Vitamin D supplement.   Lab Results  Component Value Date   VD25OH 35 01/02/2018        Medication Review: Current Outpatient Medications on File Prior to Visit  Medication Sig Dispense Refill  . acetaminophen (TYLENOL) 500 MG tablet Take 1,000 mg by mouth 2 (two) times daily as needed for moderate pain or headache.    . albuterol (PROVENTIL HFA;VENTOLIN HFA) 108 (90 Base) MCG/ACT inhaler Inhale 1-2 puffs into the lungs every 6 (six) hours as needed for wheezing or shortness of breath.    . AMBULATORY NON FORMULARY MEDICATION Lift chair Dx: G20 1 Device 0  . carbidopa-levodopa (SINEMET CR) 50-200 MG tablet Take 1 tablet by mouth at bedtime. 90 tablet 1  . carbidopa-levodopa (SINEMET IR) 25-100 MG tablet TAKE 3 TABLETS AT 5AM, TAKE 2 TABLETS AT9AM, TAKE 1 TABLET AT 1PM, TAKE 2 TABLETS AT 5PM, AND TAKE 1 TABLET AT NIGHT 300 tablet 0  . Cholecalciferol (VITAMIN D3) 5000 units CAPS Take 5,000 Units by mouth 2 (two) times daily.    . hydrocortisone cream 1 % Apply 1 application topically daily as needed for itching.    . methocarbamol (ROBAXIN) 500 MG tablet Take by mouth.    .  midodrine (PROAMATINE) 10 MG tablet Take 1/2-1 tab thee times daily for low blood pressure. 180 tablet 1  . NON FORMULARY Arthritis cream    . senna (SENOKOT) 8.6 MG tablet Take 1 tablet by mouth daily.    . vitamin B-12 (CYANOCOBALAMIN) 500 MCG tablet Take 500 mcg by mouth daily.    [name] omeprazole (PRILOSEC) 40 MG capsule Take 1 capsule (40 mg total) by mouth daily. (Patient not taking: Reported on 12/11/2019) 30 capsule 1   No current facility-administered medications on file prior to visit.    Allergies: Allergies  Allergen Reactions  . Macrobid [Nitrofurantoin Macrocrystal] Swelling and Rash  . Baclofen Other (See Comments)    Kept her awake, affected memory  . Cephalosporins Other (See Comments)    Cannot sleep  . Codeine Nausea Only    dizziness  . Darvocet [Propoxyphene N-Acetaminophen] Nausea And Vomiting  . Diclofenac  Rash    The gel caused a rash  . Levaquin [Levofloxacin] Hives  . Penicillins Rash  . Selegiline Hcl Rash  . Tramadol Nausea And Vomiting  . Triamcinolone Itching    Current Problems (verified) has Parkinson disease ([place]); RBD (REM behavioral disorder); Hyperlipidemia; Hypertension; GERD (gastroesophageal reflux disease); Abnormal glucose; Vitamin D deficiency; Medication management; SI (sacroiliac) pain; Chronic pain syndrome; Lumbar radiculopathy; Lumbar stenosis with neurogenic claudication; Positive colorectal cancer screening using Cologuard test; Frequent falls; S/P spinal fusion; Postlaminectomy syndrome, not elsewhere classified; and Osteoporosis on their problem list.  Screening Tests Immunization History  Administered Date(s) Administered  . Influenza, High Dose Seasonal PF 09/08/2014, 10/04/2015, 09/02/2019  . Influenza-Unspecified 09/29/2016, 08/08/2017, 08/22/2018  . Pneumococcal Conjugate-13 11/10/2015  . Pneumococcal Polysaccharide-23 02/26/2017  . Pneumococcal-Unspecified 11/27/2006  . Td 09/08/2013  . Zoster 06/27/2006    Preventative  care: Last colonoscopy: 2008 DEXA: 2019, osteoporosis, alendronate failed  Mammogram: 09/2017, Dr. Ulanda Edison  Ct lumbar 09/2017 cologuard 02/2017 saw Dr. Amedeo Plenty will just monitor  Names of Other Physician/Practitioners you currently use: 1. [facility] here for primary care 2. Dr. Sabra Heck, eye doctor, last visit 2020, has reading glasses 3. Dr. Kathryne Sharper, dentist, last visit 11/2019, goes q22m  Patient Care Team: Unk Pinto, MD as PCP - General (Internal Medicine) Teena Irani, MD (Inactive) as Consulting Physician (Gastroenterology) Troy Sine, MD as Consulting Physician (Cardiology) Lyndal Pulley, DO as Attending Physician (Family Medicine) [name], Eustace Quail, DO as Consulting Physician (Neurology) Marybelle Killings, MD as Consulting Physician (Orthopedic Surgery)  Surgical: She  has a past surgical history that includes Abdominal hysterectomy; Hernia repair; Rotator cuff repair (Left, 10-07-12); Bunionectomy (Bilateral, 11-11-3); Cardiac catheterization (Bilateral, 2013); left heart catheterization with coronary angiogram (N/A, 07/03/2012); Appendectomy; Colonoscopy; Back surgery (12/20/2015); Repair of cerebrospinal fluid leak (N/A, 12/24/2015); Back surgery (09/27/2017); and Back surgery (2020). Family Her family history includes CVA in her brother; Esophageal cancer in her mother; Heart attack in her mother; Hypercholesterolemia in her brother; Hypertension in her brother; Mental retardation in her brother; Suicidality in her brother and father. Social history  She reports that she has never smoked. She has never used smokeless tobacco. She reports that she does not drink alcohol or use drugs.  MEDICARE WELLNESS OBJECTIVES: Physical activity: Current Exercise Habits: The patient does not participate in regular exercise at present, Exercise limited by: neurologic condition(s) Cardiac risk factors: Cardiac Risk Factors include: advanced age (>44men, >72  women);dyslipidemia;hypertension;sedentary lifestyle Depression/mood screen:   Depression screen [facility] 2/9 12/23/2019  Decreased Interest 0  Down, Depressed, Hopeless 1  PHQ - 2 Score 1  Altered sleeping -  Tired, decreased energy -  Change in appetite -  Feeling bad or failure about yourself  -  Trouble concentrating -  Moving slowly or fidgety/restless -  Suicidal thoughts -  PHQ-9 Score -  Difficult doing work/chores -    ADLs:  In your present state of health, do you have any difficulty performing the following activities: 12/23/2019  Hearing? N  Vision? N  Difficulty concentrating or making decisions? N  Walking or climbing stairs? Y  Comment high fall risk, in wheelchair per neurology  Dressing or bathing? Y  Comment husband helps, has shower chair with flexible hose  Doing errands, shopping? Y  Comment no longer driving, husband Restaurant manager, fast food and eating ? Y  Using the Toilet? N  In the past six months, have you accidently leaked urine? N  Do you have problems  with loss of bowel control? N  Managing your Medications? Y  Comment husband assists  Managing your Finances? N  Comment husband Engineer, mining? Y  Comment family assists, has 2-3 days helper as well  Some recent data might be hidden     Cognitive Testing  Alert? Yes  Normal Appearance?Yes  Oriented to person? Yes  Place? Yes   Time? Yes  Recall of three objects?  Yes  Can perform simple calculations? Yes  Displays appropriate judgment?Yes  Can read the correct time from a watch face?Yes  EOL planning: Does Patient Have a Medical Advance Directive?: Yes Type of Advance Directive: Healthcare Power of Attorney, Living will Does patient want to make changes to medical advance directive?: No - Patient declined Copy of [place] in Chart?: No - copy requested   Objective:   Today's Vitals   12/23/19 1522  BP: 110/72  Pulse: 79  Temp: 97.7  F (36.5 C)  SpO2: 98%  Weight: 130 lb (59 kg)   Body mass index is 25.39 kg/m.  General appearance: alert, no distress, WD/WN, female HEENT: normocephalic, sclerae anicteric, TMs pearly, nares patent, no discharge or erythema, pharynx normal, Oral cavity: MMM, no lesions Neck: supple, no lymphadenopathy, no thyromegaly, no masses Heart: RRR, normal S1, S2, no murmurs Lungs: CTA bilaterally, no wheezes, rhonchi, or rales Abdomen: +bs, soft, non tender, slightly distended, no masses, no hepatomegaly, no splenomegaly Musculoskeletal: Obvious bony enlargement at MCP, DIP, PIP joints without erythema, heat, effusion, with mild ulnar deviation deformity bilaterally; left 3rd and 4th digit will catch without full extension, 3rd digit deviates under 4th digit.  Extremities: no edema, no cyanosis, no clubbing Pulses: 2+ symmetric, upper and lower extremities, normal cap refill Neurological: alert, oriented x 3, CN2-12 intact, strength decreased upper extremities and lower extremities, sensation normal throughout, has baseline tremor, mild mask facies. In wheelchair, gait not assessed.   Psychiatric: normal affect, behavior normal, pleasant   Medicare Attestation I have personally reviewed: The patient's medical and social history Their use of alcohol, tobacco or illicit drugs Their current medications and supplements The patient's functional ability including ADLs,fall risks, home safety risks, cognitive, and hearing and visual impairment Diet and physical activities Evidence for depression or mood disorders  The patient's weight, height, BMI, and visual acuity have been recorded in the chart.  I have made referrals, counseling, and provided education to the patient based on review of the above and I have provided the patient with a written personalized care plan for preventive services.     Alexandra Ribas, NP   12/23/2019

## 2019-12-25 DIAGNOSIS — S22078D Other fracture of T9-T10 vertebra, subsequent encounter for fracture with routine healing: Secondary | ICD-10-CM | POA: Diagnosis not present

## 2019-12-25 DIAGNOSIS — Z9889 Other specified postprocedural states: Secondary | ICD-10-CM | POA: Diagnosis not present

## 2019-12-25 DIAGNOSIS — S22070D Wedge compression fracture of T9-T10 vertebra, subsequent encounter for fracture with routine healing: Secondary | ICD-10-CM | POA: Diagnosis not present

## 2019-12-25 DIAGNOSIS — M47812 Spondylosis without myelopathy or radiculopathy, cervical region: Secondary | ICD-10-CM | POA: Diagnosis not present

## 2019-12-25 DIAGNOSIS — M5136 Other intervertebral disc degeneration, lumbar region: Secondary | ICD-10-CM | POA: Diagnosis not present

## 2019-12-25 DIAGNOSIS — M40294 Other kyphosis, thoracic region: Secondary | ICD-10-CM | POA: Diagnosis not present

## 2019-12-25 DIAGNOSIS — Z981 Arthrodesis status: Secondary | ICD-10-CM | POA: Diagnosis not present

## 2020-01-05 ENCOUNTER — Other Ambulatory Visit: Payer: Self-pay | Admitting: Neurology

## 2020-01-05 NOTE — Patient Instructions (Addendum)

## 2020-01-05 NOTE — Progress Notes (Signed)
History of Present Illness:      This very nice 79 y.o. MWF with longstanding Parkinson's Dz and unstable gait, hx/o multiple falls presents for 3 month follow up with HTN, HLD, Pre-Diabetes, Parkinson's Dz,  and Vitamin D Deficiency. Today, patient is c/o mid to low para-lumbar pains w/o sciatica. She's also c/o bliat lower abdominal discomfort Lt>>Rt. Denies N/V or Diarrhea.  In Nov 2020, CT Abd/Pelvis showed no significant abnormalities. She reports having to take laxatives(Senokot)  to have BM's 2 x /day. She specifically denies any heartburn or reflux type sx's and therefore has not been taking her Prilosec.       Patient's Parkinson's Dz predates circa 2002 and she's followed closely by Dr [name] managing patient's Parkinson's meds.  Patient (& spouse) are somewhat frustrated that her various med regimens have not cured or fully controlled her sx's.  Patient has unstable gait with hx/o multiple fall and has been advised to limit mobility to wheelchair only. Patient also has hx/o multiple back surgeries in 2017, 2018 and 2020.        Patient is also followed for labile HTN & hx/o postural hypotension.   Today's BP is at goal - 126/80. In 2008 & 2013, she had  negative Cardiolite scans.  Patient has had no complaints of any cardiac type chest pain, palpitations, dyspnea / orthopnea / PND, dizziness, claudication, or dependent edema.      Hyperlipidemia is controlled with diet & meds. Patient denies myalgias or other med SE's. Last Lipids were not at goal:  Lab Results  Component Value Date   CHOL 204 (H) 08/01/2018   HDL 47 (L) 08/01/2018   LDLCALC 126 (H) 08/01/2018   TRIG 185 (H) 08/01/2018   CHOLHDL 4.3 08/01/2018                                                               Also, the patient has history of PreDiabetes  (A1c 6.3% / 2011)  and has had no symptoms of reactive hypoglycemia, diabetic polys, paresthesias or visual blurring.  Last A1c was Normal & at goal:  Lab Results   Component Value Date   HGBA1C 5.4 01/02/2018                                                                Further, the patient also has history of Vitamin D Deficiency ("23" / 2008)  and supplements vitamin D without any suspected side-effects. Last vitamin D was still very low:  Lab Results  Component Value Date   VD25OH 35 01/02/2018    Current Outpatient Medications on File Prior to Visit  Medication Sig  . acetaminophen (TYLENOL) 500 MG tablet Take 1,000 mg by mouth 2 (two) times daily as needed for moderate pain or headache.  . albuterol (PROVENTIL HFA;VENTOLIN HFA) 108 (90 Base) MCG/ACT inhaler Inhale 1-2 puffs into the lungs every 6 (six) hours as needed for wheezing or shortness of breath.  . AMBULATORY NON FORMULARY MEDICATION Lift chair Dx: G20  . carbidopa-levodopa (SINEMET CR)  50-200 MG tablet Take 1 tablet by mouth at bedtime.  . carbidopa-levodopa (SINEMET IR) 25-100 MG tablet TAKE 3 TABLETS AT 5AM, TAKE 2 TABLETS AT9AM, TAKE 1 TABLET AT 1PM, TAKE 2 TABLETS AT 5PM, AND TAKE 1 TABLET AT NIGHT  . Cholecalciferol (VITAMIN D3) 5000 units CAPS Take 5,000 Units by mouth 2 (two) times daily.  . hydrocortisone cream 1 % Apply 1 application topically daily as needed for itching.  . methocarbamol (ROBAXIN) 500 MG tablet Take by mouth.  . midodrine (PROAMATINE) 10 MG tablet Take 1/2-1 tab thee times daily for low blood pressure.  . NON FORMULARY Arthritis cream  . ondansetron (ZOFRAN-ODT) 4 MG disintegrating tablet Take 4 mg by mouth every 8 (eight) hours as needed for nausea or vomiting.  . senna (SENOKOT) 8.6 MG tablet Take 1 tablet by mouth daily.  . sertraline (ZOLOFT) 100 MG tablet Takes 1.5 tablet at night  . vitamin B-12 (CYANOCOBALAMIN) 500 MCG tablet Take 500 mcg by mouth daily.   No current facility-administered medications on file prior to visit.    Allergies  Allergen Reactions  . Macrobid [Nitrofurantoin Macrocrystal] Swelling and Rash  . Baclofen Other (See  Comments)    Kept her awake, affected memory  . Cephalosporins Other (See Comments)    Cannot sleep  . Codeine Nausea Only    dizziness  . Darvocet [Propoxyphene N-Acetaminophen] Nausea And Vomiting  . Diclofenac Rash    The gel caused a rash  . Levaquin [Levofloxacin] Hives  . Penicillins Rash  . Selegiline Hcl Rash  . Tramadol Nausea And Vomiting  . Triamcinolone Itching    PMHx:   Past Medical History:  Diagnosis Date  . Anemia    as young girl and teenager  . Arthritis   . Chest pain, unspecified   . Constipation   . Costochondritis   . GERD (gastroesophageal reflux disease)   . Headache    ocular migraines  . History of hiatal hernia   . Hyperlipidemia 10-07-12  . Never smoked tobacco   . Parkinson's disease ([place]) 10-07-12  . Parkinson's disease ([place])   . Prediabetes   . 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 Spinal stenosis     Immunization History  Administered Date(s) Administered  . Influenza, High Dose Seasonal PF 09/08/2014, 10/04/2015, 09/02/2019  . Influenza-Unspecified 09/29/2016, 08/08/2017, 08/22/2018  . Pneumococcal Conjugate-13 11/10/2015  . Pneumococcal Polysaccharide-23 02/26/2017  . Pneumococcal-Unspecified 11/27/2006  . Td 09/08/2013  . Zoster 06/27/2006    Past Surgical History:  Procedure Laterality Date  . ABDOMINAL HYSTERECTOMY    . APPENDECTOMY    . BACK SURGERY  12/20/2015  . BACK SURGERY  09/27/2017  . BACK SURGERY  2020  . BUNIONECTOMY Bilateral 11-11-3  . CARDIAC CATHETERIZATION Bilateral 2013  . COLONOSCOPY    . HERNIA REPAIR    . LEFT HEART CATHETERIZATION WITH CORONARY ANGIOGRAM N/A 07/03/2012   Procedure: LEFT HEART CATHETERIZATION WITH CORONARY ANGIOGRAM;  Surgeon: Troy Sine, MD;  Location: [facility] CATH LAB;  Service: Cardiovascular;  Laterality: N/A;  . REPAIR OF CEREBROSPINAL FLUID LEAK N/A 12/24/2015   Procedure: LUMBAR WOUND EXPLORATION, REPAIR OF CEREBROSPINAL FLUID LEAK, PLACEMENT OF LUMBAR DRAIN;  Surgeon:  Kevan Ny Ditty, MD;  Location: [place] NEURO ORS;  Service: Neurosurgery;  Laterality: N/A;  . ROTATOR CUFF REPAIR Left 10-07-12    FHx:    Reviewed / unchanged  SHx:    Reviewed / unchanged   Systems Review:  Constitutional: Denies fever, chills, wt changes, headaches, insomnia,  fatigue, night sweats, change in appetite. Eyes: Denies redness, blurred vision, diplopia, discharge, itchy, watery eyes.  ENT: Denies discharge, congestion, post nasal drip, epistaxis, sore throat, earache, hearing loss, dental pain, tinnitus, vertigo, sinus pain, snoring.  CV: Denies chest pain, palpitations, irregular heartbeat, syncope, dyspnea, diaphoresis, orthopnea, PND, claudication or edema. Respiratory: denies cough, dyspnea, DOE, pleurisy, hoarseness, laryngitis, wheezing.  Gastrointestinal: Denies dysphagia, odynophagia, heartburn, reflux, water brash, abdominal pain or cramps, nausea, vomiting, bloating, diarrhea, constipation, hematemesis, melena, hematochezia  or hemorrhoids. Genitourinary: Denies dysuria, frequency, urgency, nocturia, hesitancy, discharge, hematuria or flank pain. Musculoskeletal: Denies arthralgias, myalgias, stiffness, jt. swelling, pain, limping or strain/sprain.  Skin: Denies pruritus, rash, hives, warts, acne, eczema or change in skin lesion(s). Neuro: No weakness, tremor, incoordination, spasms, paresthesia or pain. Psychiatric: Denies confusion, memory loss or sensory loss. Endo: Denies change in weight, skin or hair change.  Heme/Lymph: No excessive bleeding, bruising or enlarged lymph nodes.  Physical Exam  BP 126/80   Pulse 76   Temp (!) 97.2 F (36.2 C)   Resp 16   Ht 5' (1.524 m)   Wt 131 lb (59.4 kg)   BMI 25.58 kg/m   Appears  well nourished, well groomed  and in no distress.  Eyes: PERRLA, EOMs, conjunctiva no swelling or erythema. Sinuses: No frontal/maxillary tenderness ENT/Mouth: EAC's clear, TM's nl w/o erythema, bulging. Nares clear w/o erythema,  swelling, exudates. Oropharynx clear without erythema or exudates. Oral hygiene is good. Tongue normal, non obstructing. Hearing intact.  Neck: Supple. Thyroid not palpable. Car 2+/2+ without bruits, nodes or JVD. Chest: Respirations nl with BS clear & equal w/o rales, rhonchi, wheezing or stridor.  Cor: Heart sounds normal w/ regular rate and rhythm without sig. murmurs, gallops, clicks or rubs. Peripheral pulses normal and equal  without edema.  Abdomen: Soft & bowel sounds normal. Non-tender w/o guarding, rebound, hernias, masses or organomegaly.  Lymphatics: Unremarkable.  Musculoskeletal: Full ROM all peripheral extremities, joint stability, 5/5 strength and in wheelchair.   Skin: Warm, dry without exposed rashes, lesions or ecchymosis apparent.  Neuro: Cranial nerves intact, reflexes equal bilaterally. Sensory-motor testing grossly intact. Tendon reflexes grossly intact.  (+) cog wheeling.  Pysch: Alert & oriented x 3.  Insight and judgement nl & appropriate. No ideations.  Assessment and Plan:  1. Essential hypertension  - Continue medication, monitor blood pressure at home.  - Continue DASH diet.  Reminder to go to the ER if any CP,  SOB, nausea, dizziness, severe HA, changes vision/speech.  - CBC with Differential/Platelet - COMPLETE METABOLIC PANEL WITH GFR - Magnesium - TSH  2. Hyperlipidemia, mixed  - Continue diet/meds, exercise,& lifestyle modifications.  - Continue monitor periodic cholesterol/liver & renal functions   - Lipid panel - TSH  3. Abnormal glucose  - Continue diet, exercise  - Lifestyle modifications.  - Monitor appropriate labs.  - Hemoglobin A1c - Insulin, random  4. Vitamin D deficiency  - Continue supplementation.  - VITAMIN D 25 Hydroxy  5. Parkinson disease ([place])  6. Abdominal Pains, suspect IBS  - will try empirically Bentyl for abdominal sx's and recommend take  1/2 dose Miralax equivalent in AM & continue her Senokot at night.    7. Medication management  - CBC with Differential/Platelet - COMPLETE METABOLIC PANEL WITH GFR - Magnesium - Lipid panel - TSH - Hemoglobin A1c - Insulin, random - VITAMIN D 25 Hydroxy          Discussed  regular exercise, BP monitoring, weight control to achieve/maintain  BMI less than 25 and discussed med and SE's. Recommended labs to assess and monitor clinical status with further disposition pending results of labs.  I discussed the assessment and treatment plan with the patient. The patient was provided an opportunity to ask questions and all were answered. The patient agreed with the plan and demonstrated an understanding of the instructions.  I provided over 30 minutes of exam, counseling, chart review and  complex critical decision making.  Kirtland Bouchard, MD  This note is not being shared with the patient for the following reason: To prevent harm (release of this note would result in harm to the life or physical safety of the patient or another).

## 2020-01-06 ENCOUNTER — Other Ambulatory Visit: Payer: Self-pay

## 2020-01-06 ENCOUNTER — Ambulatory Visit (INDEPENDENT_AMBULATORY_CARE_PROVIDER_SITE_OTHER): Payer: Medicare Other | Admitting: Internal Medicine

## 2020-01-06 ENCOUNTER — Encounter: Payer: Self-pay | Admitting: Internal Medicine

## 2020-01-06 VITALS — BP 126/80 | HR 76 | Temp 97.2°F | Resp 16 | Ht 60.0 in | Wt 131.0 lb

## 2020-01-06 DIAGNOSIS — R7309 Other abnormal glucose: Secondary | ICD-10-CM

## 2020-01-06 DIAGNOSIS — G20 Parkinson's disease: Secondary | ICD-10-CM

## 2020-01-06 DIAGNOSIS — I10 Essential (primary) hypertension: Secondary | ICD-10-CM | POA: Diagnosis not present

## 2020-01-06 DIAGNOSIS — Z79899 Other long term (current) drug therapy: Secondary | ICD-10-CM | POA: Diagnosis not present

## 2020-01-06 DIAGNOSIS — E559 Vitamin D deficiency, unspecified: Secondary | ICD-10-CM

## 2020-01-06 DIAGNOSIS — E782 Mixed hyperlipidemia: Secondary | ICD-10-CM

## 2020-01-06 DIAGNOSIS — K581 Irritable bowel syndrome with constipation: Secondary | ICD-10-CM | POA: Diagnosis not present

## 2020-01-06 MED ORDER — DICYCLOMINE HCL 20 MG PO TABS: ORAL_TABLET | ORAL | 0 refills | Status: DC

## 2020-01-07 ENCOUNTER — Other Ambulatory Visit: Payer: Self-pay | Admitting: Internal Medicine

## 2020-01-07 DIAGNOSIS — E782 Mixed hyperlipidemia: Secondary | ICD-10-CM

## 2020-01-07 LAB — CBC WITH DIFFERENTIAL/PLATELET
Absolute Monocytes: 592 cells/uL (ref 200–950)
Basophils Absolute: 27 cells/uL (ref 0–200)
Basophils Relative: 0.4 %
Eosinophils Absolute: 238 cells/uL (ref 15–500)
Eosinophils Relative: 3.5 %
HCT: 37.5 % (ref 35.0–45.0)
Hemoglobin: 12.1 g/dL (ref 11.7–15.5)
Lymphs Abs: 1197 cells/uL (ref 850–3900)
MCH: 29.7 pg (ref 27.0–33.0)
MCHC: 32.3 g/dL (ref 32.0–36.0)
MCV: 91.9 fL (ref 80.0–100.0)
MPV: 10.3 fL (ref 7.5–12.5)
Monocytes Relative: 8.7 %
Neutro Abs: 4746 cells/uL (ref 1500–7800)
Neutrophils Relative %: 69.8 %
Platelets: 275 10*3/uL (ref 140–400)
RBC: 4.08 10*6/uL (ref 3.80–5.10)
RDW: 13.9 % (ref 11.0–15.0)
Total Lymphocyte: 17.6 %
WBC: 6.8 10*3/uL (ref 3.8–10.8)

## 2020-01-07 LAB — COMPLETE METABOLIC PANEL WITH GFR
AG Ratio: 2 (calc) (ref 1.0–2.5)
ALT: 6 U/L (ref 6–29)
AST: 14 U/L (ref 10–35)
Albumin: 4.2 g/dL (ref 3.6–5.1)
Alkaline phosphatase (APISO): 131 U/L (ref 37–153)
BUN: 25 mg/dL (ref 7–25)
CO2: 26 mmol/L (ref 20–32)
Calcium: 9.7 mg/dL (ref 8.6–10.4)
Chloride: 107 mmol/L (ref 98–110)
Creat: 0.91 mg/dL (ref 0.60–0.93)
GFR, Est African American: 71 mL/min/{1.73_m2} (ref >=60)
GFR, Est Non African American: 61 mL/min/{1.73_m2} (ref >=60)
Globulin: 2.1 g/dL (calc) (ref 1.9–3.7)
Glucose, Bld: 98 mg/dL (ref 65–99)
Potassium: 4.4 mmol/L (ref 3.5–5.3)
Sodium: 141 mmol/L (ref 135–146)
Total Bilirubin: 0.5 mg/dL (ref 0.2–1.2)
Total Protein: 6.3 g/dL (ref 6.1–8.1)

## 2020-01-07 LAB — LIPID PANEL
Cholesterol: 268 mg/dL — ABNORMAL HIGH (ref <200)
HDL: 49 mg/dL — ABNORMAL LOW (ref >=50)
LDL Cholesterol (Calc): 180 mg/dL (calc) — ABNORMAL HIGH
Non-HDL Cholesterol (Calc): 219 mg/dL (calc) — ABNORMAL HIGH (ref <130)
Total CHOL/HDL Ratio: 5.5 (calc) — ABNORMAL HIGH (ref <5.0)
Triglycerides: 233 mg/dL — ABNORMAL HIGH (ref <150)

## 2020-01-07 LAB — MAGNESIUM: Magnesium: 2.1 mg/dL (ref 1.5–2.5)

## 2020-01-07 LAB — HEMOGLOBIN A1C
Hgb A1c MFr Bld: 5.6 % of total Hgb (ref <5.7)
Mean Plasma Glucose: 114 (calc)
eAG (mmol/L): 6.3 (calc)

## 2020-01-07 LAB — INSULIN, RANDOM: Insulin: 12.1 u[IU]/mL

## 2020-01-07 LAB — VITAMIN D 25 HYDROXY (VIT D DEFICIENCY, FRACTURES): Vit D, 25-Hydroxy: 87 ng/mL (ref 30–100)

## 2020-01-07 LAB — TSH: TSH: 0.89 mIU/L (ref 0.40–4.50)

## 2020-01-07 MED ORDER — ROSUVASTATIN CALCIUM 20 MG PO TABS: ORAL_TABLET | ORAL | 1 refills | Status: DC

## 2020-01-09 ENCOUNTER — Telehealth: Payer: Self-pay | Admitting: *Deleted

## 2020-01-09 NOTE — Telephone Encounter (Signed)
Patient is aware of lab results.

## 2020-01-12 DIAGNOSIS — S66812A Strain of other specified muscles, fascia and tendons at wrist and hand level, left hand, initial encounter: Secondary | ICD-10-CM | POA: Diagnosis not present

## 2020-01-12 DIAGNOSIS — S66811A Strain of other specified muscles, fascia and tendons at wrist and hand level, right hand, initial encounter: Secondary | ICD-10-CM | POA: Diagnosis not present

## 2020-01-12 DIAGNOSIS — S63213A Subluxation of metacarpophalangeal joint of left middle finger, initial encounter: Secondary | ICD-10-CM | POA: Diagnosis not present

## 2020-01-12 DIAGNOSIS — M79642 Pain in left hand: Secondary | ICD-10-CM | POA: Diagnosis not present

## 2020-01-12 DIAGNOSIS — M79641 Pain in right hand: Secondary | ICD-10-CM | POA: Diagnosis not present

## 2020-01-12 DIAGNOSIS — M19041 Primary osteoarthritis, right hand: Secondary | ICD-10-CM | POA: Diagnosis not present

## 2020-01-12 DIAGNOSIS — M19042 Primary osteoarthritis, left hand: Secondary | ICD-10-CM | POA: Diagnosis not present

## 2020-01-12 DIAGNOSIS — M180 Bilateral primary osteoarthritis of first carpometacarpal joints: Secondary | ICD-10-CM | POA: Diagnosis not present

## 2020-01-19 DIAGNOSIS — M79645 Pain in left finger(s): Secondary | ICD-10-CM | POA: Diagnosis not present

## 2020-01-19 DIAGNOSIS — S66812D Strain of other specified muscles, fascia and tendons at wrist and hand level, left hand, subsequent encounter: Secondary | ICD-10-CM | POA: Diagnosis not present

## 2020-02-02 ENCOUNTER — Other Ambulatory Visit: Payer: Self-pay | Admitting: Internal Medicine

## 2020-02-02 DIAGNOSIS — K581 Irritable bowel syndrome with constipation: Secondary | ICD-10-CM

## 2020-02-09 ENCOUNTER — Other Ambulatory Visit: Payer: Self-pay | Admitting: *Deleted

## 2020-02-09 DIAGNOSIS — F32A Depression, unspecified: Secondary | ICD-10-CM

## 2020-02-09 DIAGNOSIS — F329 Major depressive disorder, single episode, unspecified: Secondary | ICD-10-CM

## 2020-02-09 MED ORDER — SERTRALINE HCL 100 MG PO TABS: ORAL_TABLET | ORAL | 3 refills | Status: DC

## 2020-02-10 NOTE — Progress Notes (Signed)
Assessment/Plan:   1.  Parkinsons Disease  -Patient currently on carbidopa/levodopa 25/100, 3 tablets at 5 AM/2 tablets at 9 AM/1 tablet at 1 PM/2 tablets at 5 PM.  Discussed that she really is not rigid at all today and I think that her medications are doing as good as they can.  I told her that she could try taking 2 in the AM and 6 others evenly throughout the day and see if she thinks it works better  -Continue carbidopa/levodopa 50/200 at bedtime  -Patient wants to go back on her pramipexole.  States that she would rather hallucinate than be related.  However, I discussed with her that this is just not good care and it is never sacrificing cognition, and I was not willing to put her back on the pramipexole.  I have offered multiple times a second opinion, however.  I offered this today as well.  -Discussed again that patient should be seated at all times.  Has declined a wheelchair.  I think she needs one.  Has been referred for wheelchair evaluation by her orthopedic spine specialist, but declined when they called.  Husband reported today that he did not know about this and appeared frustrated with this.  Patient stated today in the room that she would call them back and was agreeable for the wheelchair.  2.  Low back pain  -Status post multiple surgeries.  She did not recover well.  This contributes to her issues with walking.  3.  RBD  -Bedroom safety discussed.  Currently not on any medication for it.  4.  Neurogenic Orthostatic Hypotension  -On midodrine, 10 mg 3 times per day.  She still c/o dizziness but BP log was reviewed but most BP recorded were on higher end.  5.  PDD with parkinsons hallucinations  -start nuplazid 34 mg daily.  Discussed r/b/se including black box warning and what that meant.  They agreed to try.  Samples given.   Subjective:   Alexandra Lindsey was seen today in follow up for Parkinsons disease.  My previous records were reviewed prior to todays visit as  well as outside records available to me.  Last visit, I tried to transition her to [place], but the patient quickly called me back and stated that within a few days, she did not like it.  She stated that she was shaky and having back pain.  We explained to her that it was not to help her back pain, and that the total levodopa dosage actually increased.  Regardless, she wanted to go back to her previous medication.  She was offered a second opinion, because her options are just very limited at this point in time.  She declined that.  She asked if I thought she should see a hand specialist for the cramping, and I told her that I did not think that was going to be helpful, but that she could ask her primary care physician if he felt something else was going on other than Parkinson's.  She saw her nurse practitioner not long thereafter, and a referral was placed to see Dr. Fredna Dow, as it turned out that she had more than hand cramping going on.  Dr. Fredna Dow did note that the patient had rupture of the extensor tendons and a trial of splinting was recommended to hopefully avoid surgical intervention.  She left the splint at home today and she cannot hold the walker with the splints.  She saw her orthopedic/spine specialist in  January.  A wheelchair was recommended, and ultimately declined by the patient per chart records.  This has been recommended by me for quite some time as well.  She request today going back on the pramipexole.  "I don't want to hallucinate though."  Still having lots of hallucinations.  Asks me if she is taking too much carbidopa/levodopa 25/100 - states that she feels "draggy and stiff."  She states that she feels "real lightheaded" but brings a detailed log of BP's and most of them are on the high end.  Current prescribed movement disorder medications: Carbidopa/levodopa 25/100, 3 tablets at 5 AM/2 tablets at 9 AM/1 tablet at 1 PM/2 tablets at 5 PM Carbidopa/levodopa 50/200 at bedtime Midodrine 10  mg 3 times per day   PREVIOUS MEDICATIONS:  Sinemet, Mirapex, Seroquel and klonopin (for RBD but didn't want to be addicted and d/c), Stalevo (costly and was splitting some in half); sinemet CR 50/200 at night (thought it made her worse but still was bad after it was d/c); inbrija; rytary (only took a few days and patient stated that she just did not like it but back pain was the c/o)  ALLERGIES:   Allergies  Allergen Reactions  . Macrobid [Nitrofurantoin Macrocrystal] Swelling and Rash  . Baclofen Other (See Comments)    Kept her awake, affected memory  . Cephalosporins Other (See Comments)    Cannot sleep  . Codeine Nausea Only    dizziness  . Darvocet [Propoxyphene N-Acetaminophen] Nausea And Vomiting  . Diclofenac Rash    The gel caused a rash  . Levaquin [Levofloxacin] Hives  . Penicillins Rash  . Selegiline Hcl Rash  . Tramadol Nausea And Vomiting  . Triamcinolone Itching    CURRENT MEDICATIONS:  Outpatient Encounter Medications as of 02/12/2020  Medication Sig  . acetaminophen (TYLENOL) 500 MG tablet Take 1,000 mg by mouth 2 (two) times daily as needed for moderate pain or headache.  . albuterol (PROVENTIL HFA;VENTOLIN HFA) 108 (90 Base) MCG/ACT inhaler Inhale 1-2 puffs into the lungs every 6 (six) hours as needed for wheezing or shortness of breath.  . AMBULATORY NON FORMULARY MEDICATION Lift chair Dx: G20  . carbidopa-levodopa (SINEMET CR) 50-200 MG tablet Take 1 tablet by mouth at bedtime.  . carbidopa-levodopa (SINEMET IR) 25-100 MG tablet Take by mouth. TAKE 3 TABLETS AT 5AM, TAKE 2 TABLETS AT9AM, TAKE 1 TABLET AT 1PM, AND TAKE 2 TABLETS AT 5PM  . Cholecalciferol (VITAMIN D3) 5000 units CAPS Take 5,000 Units by mouth 2 (two) times daily.  [name] dicyclomine (BENTYL) 20 MG tablet Take 1/2 to 1 tablet 3 x /day before Meals Only if needed for Nausea Abdominal Cramping, Bloating or Diarrhea  . hydrocortisone cream 1 % Apply 1 application topically daily as needed for itching.   . methocarbamol (ROBAXIN) 500 MG tablet Take by mouth.  . midodrine (PROAMATINE) 10 MG tablet Take 1/2-1 tab thee times daily for low blood pressure.  . NON FORMULARY Arthritis cream  . ondansetron (ZOFRAN-ODT) 4 MG disintegrating tablet Take 4 mg by mouth every 8 (eight) hours as needed for nausea or vomiting.  . rosuvastatin (CRESTOR) 20 MG tablet Take 1 tablet daily for Cholesterol  . senna (SENOKOT) 8.6 MG tablet Take 1 tablet by mouth 2 (two) times daily.   . sertraline (ZOLOFT) 100 MG tablet Takes 1.5 tablet at night  . vitamin B-12 (CYANOCOBALAMIN) 500 MCG tablet Take 500 mcg by mouth daily.  . [DISCONTINUED] carbidopa-levodopa (SINEMET IR) 25-100 MG tablet TAKE 3 TABLETS  AT 5AM, TAKE 2 TABLETS AT9AM, TAKE 1 TABLET AT 1PM, TAKE 2 TABLETS AT 5PM, AND TAKE 1 TABLET AT NIGHT (Patient taking differently: TAKE 3 TABLETS AT 5AM, TAKE 2 TABLETS AT9AM, TAKE 1 TABLET AT 1PM, AND TAKE 2 TABLETS AT 5PM)   No facility-administered encounter medications on file as of 02/12/2020.    Objective:   PHYSICAL EXAMINATION:    VITALS:   Vitals:   02/12/20 1304  BP: 100/62  Pulse: 82  SpO2: 97%  Weight: 138 lb (62.6 kg)  Height: 5' (1.524 m)    GEN:  The patient appears stated age and is in NAD. HEENT:  Normocephalic, atraumatic.  The mucous membranes are moist. The superficial temporal arteries are without ropiness or tenderness. CV:  RRR Lungs:  CTAB Neck/HEME:  There are no carotid bruits bilaterally.  Neurological examination:  Orientation: The patient is alert and oriented x3. Cranial nerves: There is good facial symmetry with facial hypomimia. The speech is fluent and clear. Soft palate rises symmetrically and there is no tongue deviation. Hearing is intact to conversational tone. Sensation: Sensation is intact to light touch throughout Motor: Strength is at least antigravity x4.  Movement examination: Tone: There is normal in the UE/LE Abnormal movements: none Coordination:  There  is no decremation with RAM's, although she is a bit slow Gait and Station: not tested as I didn't want her walking      Total time spent on today's visit was 45 minutes, including both face-to-face time and nonface-to-face time.  Time included that spent on review of records (prior notes available to me/labs/imaging if pertinent), discussing treatment and goals, answering patient's questions and coordinating care.  Cc:  Unk Pinto, MD

## 2020-02-12 ENCOUNTER — Telehealth: Payer: Self-pay | Admitting: Neurology

## 2020-02-12 ENCOUNTER — Other Ambulatory Visit: Payer: Self-pay

## 2020-02-12 ENCOUNTER — Encounter: Payer: Self-pay | Admitting: Neurology

## 2020-02-12 ENCOUNTER — Ambulatory Visit (INDEPENDENT_AMBULATORY_CARE_PROVIDER_SITE_OTHER): Payer: Medicare Other | Admitting: Neurology

## 2020-02-12 VITALS — BP 100/62 | HR 82 | Ht 60.0 in | Wt 138.0 lb

## 2020-02-12 DIAGNOSIS — G903 Multi-system degeneration of the autonomic nervous system: Secondary | ICD-10-CM

## 2020-02-12 DIAGNOSIS — G20 Parkinson's disease: Secondary | ICD-10-CM | POA: Diagnosis not present

## 2020-02-12 DIAGNOSIS — R441 Visual hallucinations: Secondary | ICD-10-CM

## 2020-02-12 MED ORDER — NUPLAZID 34 MG PO CAPS: 1.0000 | ORAL_CAPSULE | Freq: Every day | ORAL | 0 refills | Status: DC

## 2020-02-12 NOTE — Telephone Encounter (Signed)
Spoke with patient and she states she has not been doing good since having her surgery and stopping the Mirapex. She states she would rather be on the Mirapex and have you give her something for hallucinations. She states she has been stiff as a board and can not walk well. She states she would rather have a virtual visit for today because she can hardy walk. But more importantly she wants you to change her meds.

## 2020-02-12 NOTE — Patient Instructions (Signed)
1.  Try taking carbidopa/levodopa 25/100, 2 at 5 AM and 6 others evenly throughout the day 2.  Continue carbidopa/levodopa 50/200 at bedtime 3.  Call the wheelchair evaluation people back.   4.  Do NOT walk unless you are directly holding onto someone.  No walking even with the walker 5.  Start nuplazid, 34 mg daily.  We discussed the black box warning associated with this.

## 2020-02-12 NOTE — Telephone Encounter (Signed)
Alexandra Lindsey, could you call her back

## 2020-02-12 NOTE — Telephone Encounter (Signed)
She has not been able to do video visits and only phone visits.  If that is still the case, I would prefer she come in IF she can.

## 2020-02-12 NOTE — Telephone Encounter (Signed)
Patient states that she is going to try and come in today

## 2020-02-12 NOTE — Telephone Encounter (Signed)
Patient has an appointment today with Dr. Carles Collet at 1:30 PM but she is not sure she can make it. She said she needs some advice because she cannot move out of the bed.   Patient has back pain and needs advice prior to appointment. Please call.

## 2020-02-18 ENCOUNTER — Other Ambulatory Visit: Payer: Self-pay | Admitting: Neurology

## 2020-02-20 ENCOUNTER — Telehealth: Payer: Self-pay | Admitting: Neurology

## 2020-02-20 ENCOUNTER — Telehealth: Payer: Self-pay

## 2020-02-20 MED ORDER — NUPLAZID 34 MG PO CAPS: 1.0000 | ORAL_CAPSULE | Freq: Every day | ORAL | 1 refills | Status: DC

## 2020-02-20 NOTE — Telephone Encounter (Signed)
Received rx request via fax requesting Nuplazid be sent to mail order.  Rx(s) sent to pharmacy electronically.

## 2020-02-20 NOTE — Telephone Encounter (Signed)
Left message with the after hour service on 02-19-20 9:32 pm    Caller states she is Amy from [facility]  She is needing to clarify a RX she states the Pharmacy is closes @ 8   Nuplazid 34 mg

## 2020-02-20 NOTE — Telephone Encounter (Signed)
Attempted to reach [facility] but phone number given was the fax number.  Unable to find other contact.  Rx was sent to local pharmacy 03/18.

## 2020-02-24 ENCOUNTER — Other Ambulatory Visit: Payer: Self-pay

## 2020-02-24 MED ORDER — NUPLAZID 34 MG PO CAPS: 1.0000 | ORAL_CAPSULE | Freq: Every day | ORAL | 1 refills | Status: DC

## 2020-02-24 NOTE — Telephone Encounter (Signed)
Received fax from [facility]. Stating they needed a rx for Nuplazid.  Called pharmacy and was informed that the insucance will pay for the medication if the patient uses the specialty pharmacy.  Rx(s) being sent to pharmacy electronically.

## 2020-02-25 ENCOUNTER — Encounter: Payer: Self-pay | Admitting: *Deleted

## 2020-02-25 NOTE — Progress Notes (Addendum)
Deshia Livezey Key: [id number] - PA Case ID: [id number] - Rx #[id number] Need help? Call us at [phone] Outcome Approvedtoday Request Reference Number: [id number]. NUPLAZID CAP 34MG  is approved through 11/26/2020. Your patient may now fill this prescription and it will be covered.   The following letter was faxed to [place] and will be sent to her chart to be scanned into her chart

## 2020-02-26 ENCOUNTER — Telehealth: Payer: Self-pay | Admitting: *Deleted

## 2020-02-26 NOTE — Telephone Encounter (Signed)
RX for patient at pick up a wheelchair, as requested.

## 2020-03-02 ENCOUNTER — Other Ambulatory Visit: Payer: Self-pay | Admitting: Internal Medicine

## 2020-03-02 DIAGNOSIS — K581 Irritable bowel syndrome with constipation: Secondary | ICD-10-CM

## 2020-03-17 IMAGING — CR DG HIP (WITH OR WITHOUT PELVIS) 4+V*L*
3 series · 3 of 3 positions shown · non-contrast
Comparison: 11/01/2017

CLINICAL DATA: Left hip pain post fall today. Small contusion on
hip. Pt has history of Parkinson's with multiple falls

EXAM:
DG HIP (WITH OR WITHOUT PELVIS) 4+V LEFT

[pelvis ap]
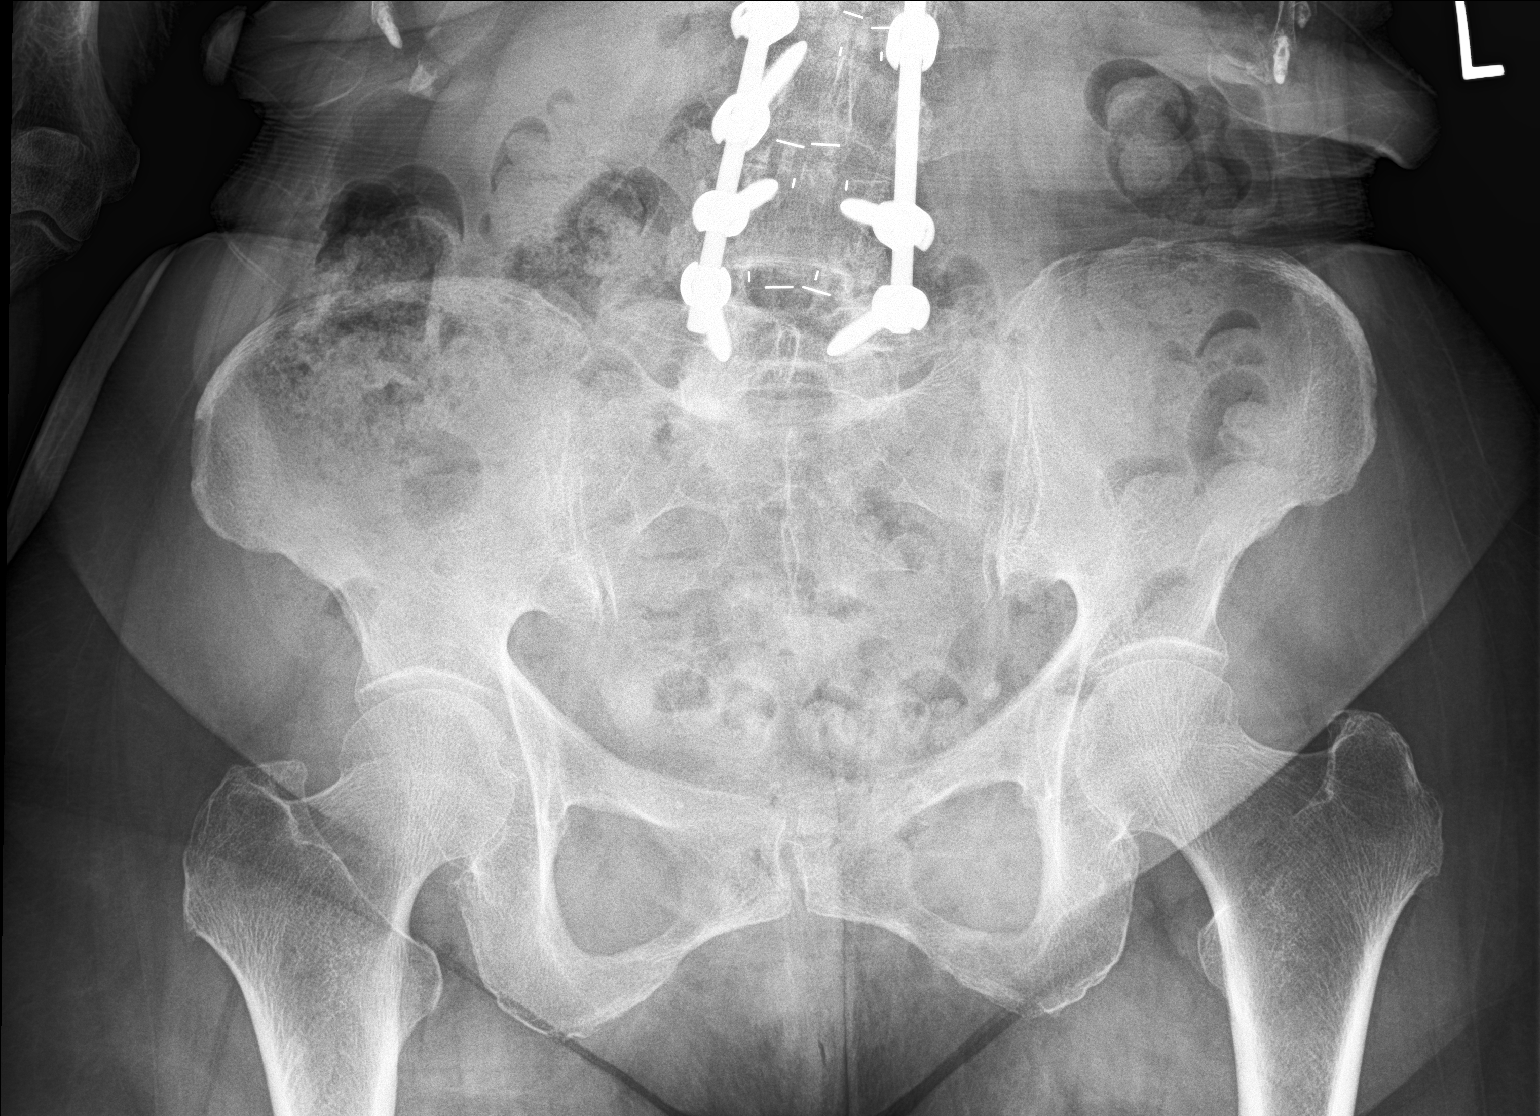

[hip ap]
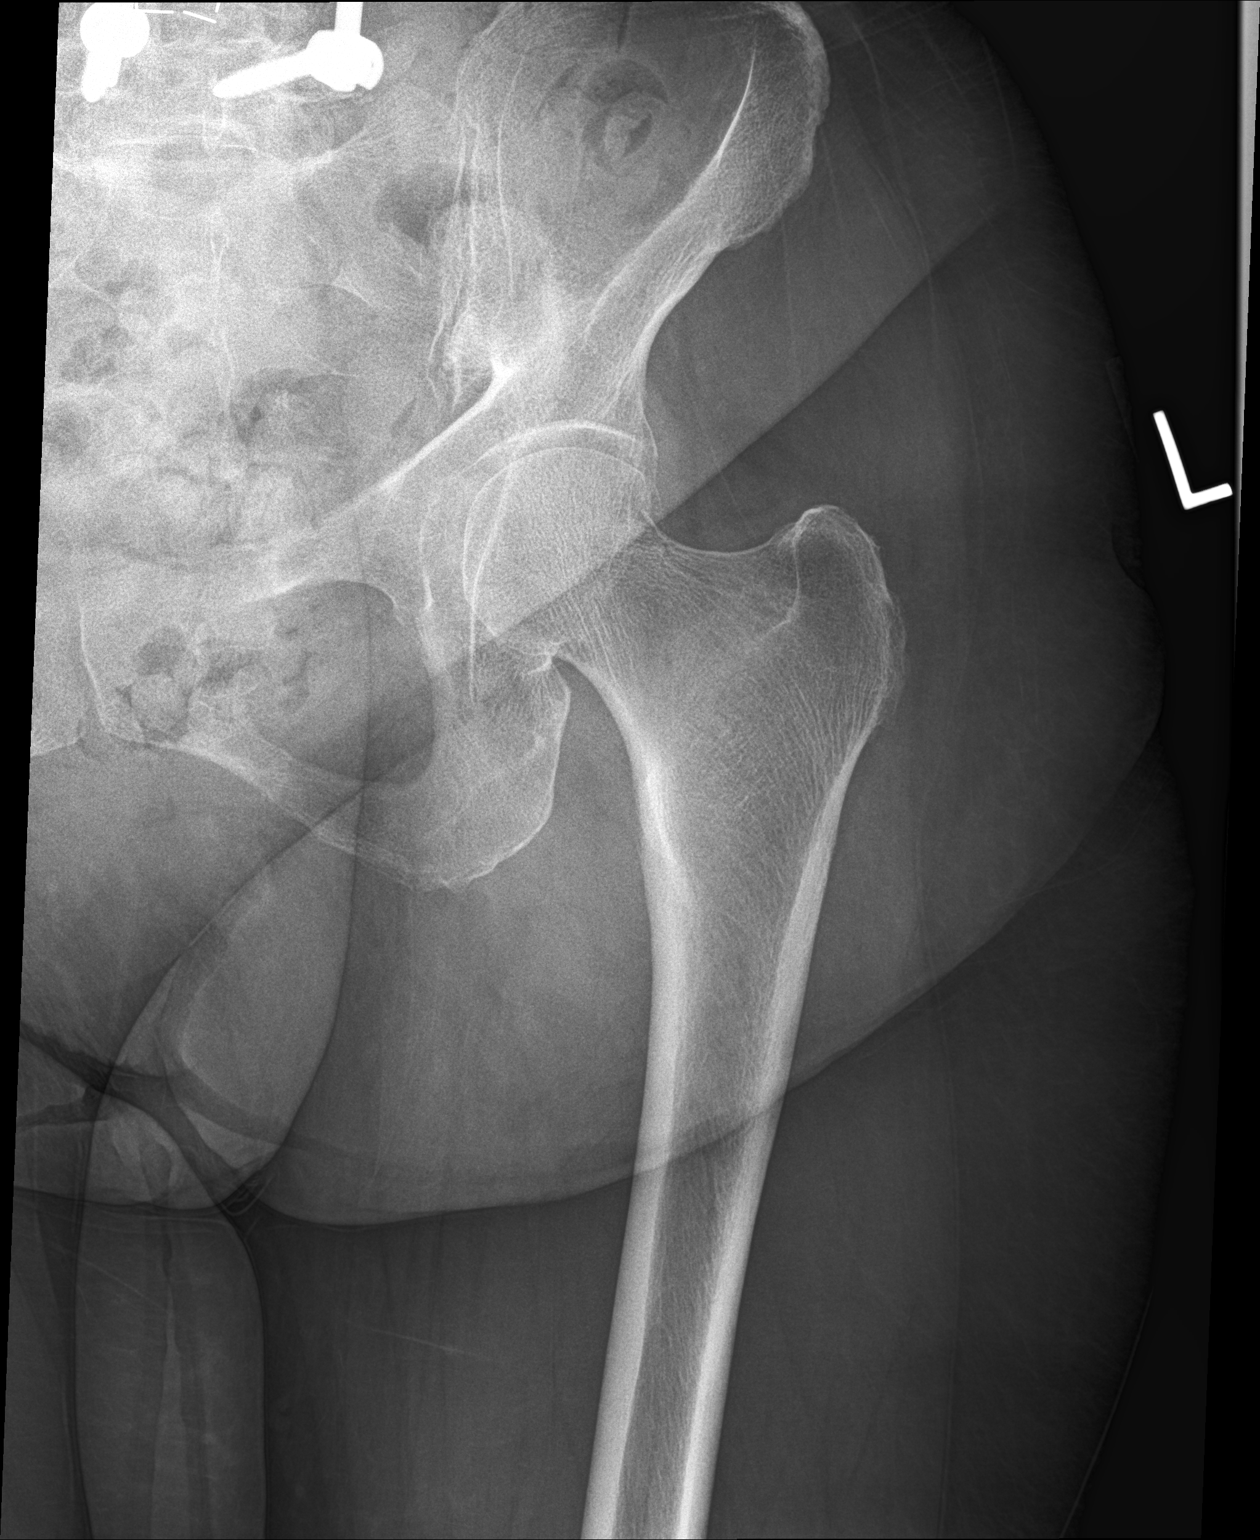

[hip frog leg]
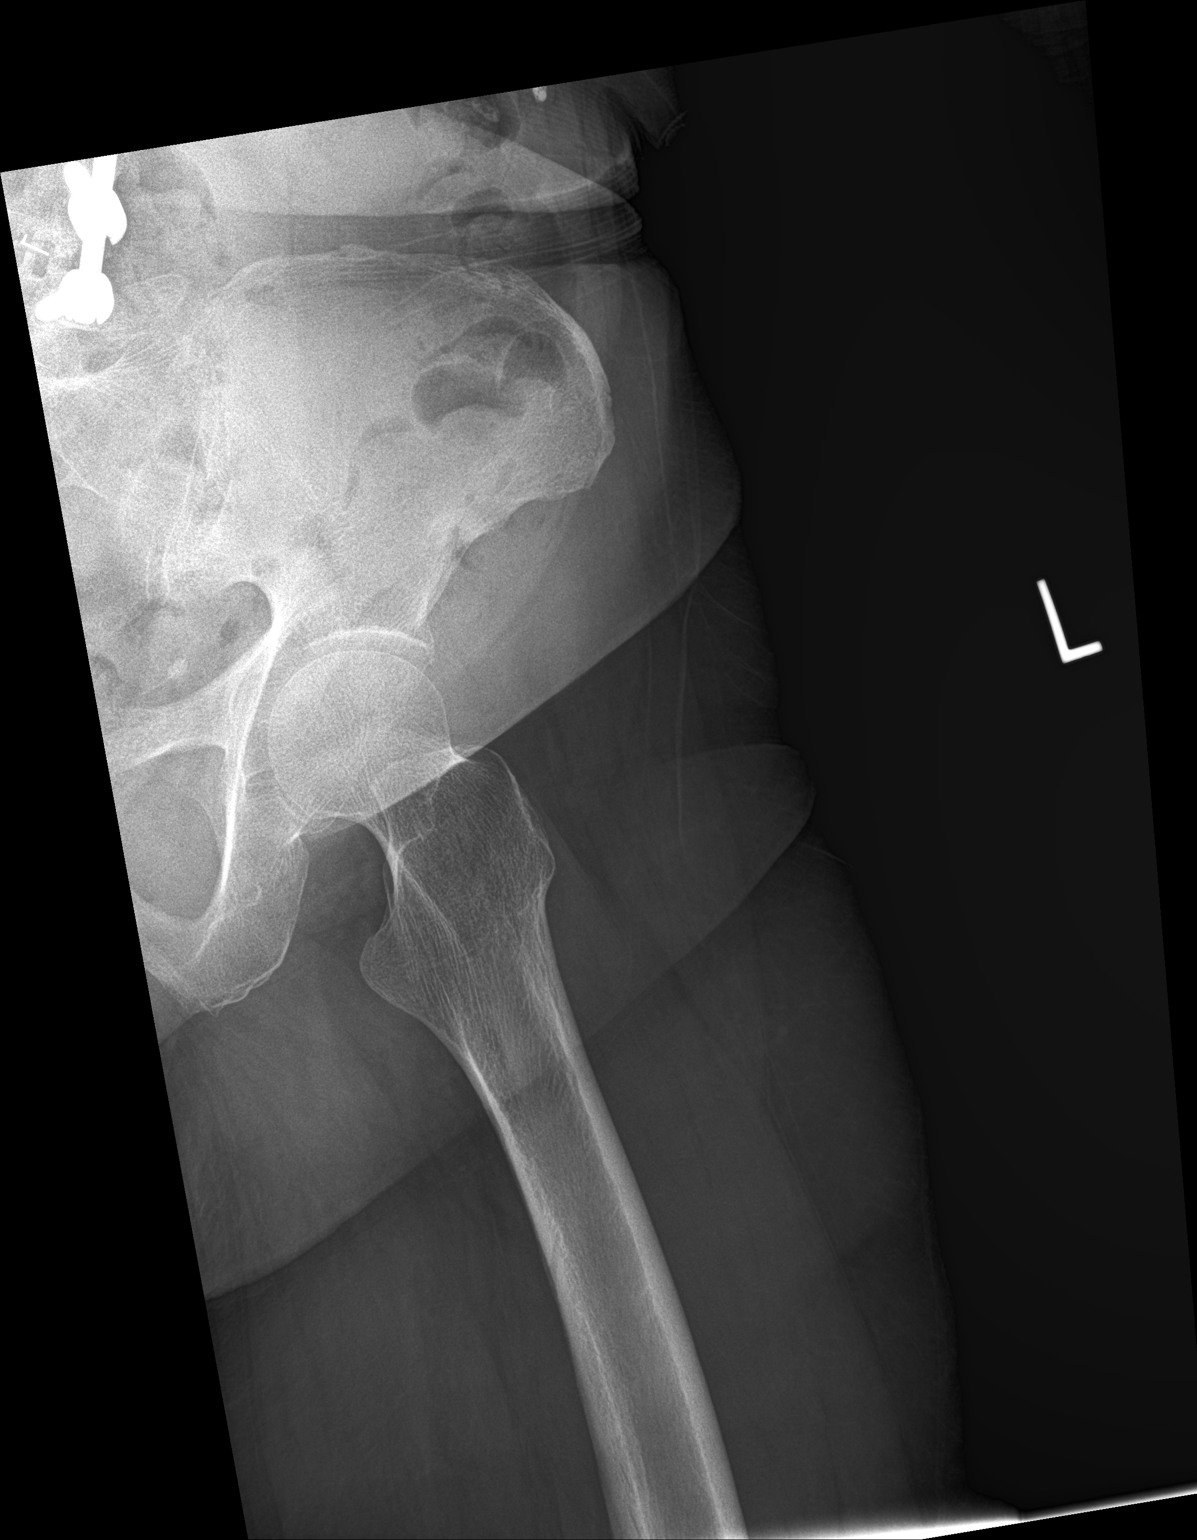

[3 of 3 positions shown; findings below may reference images not displayed]

FINDINGS: There is no evidence of hip fracture or dislocation. There is no
evidence of arthropathy or other focal bone abnormality.
IMPRESSION: Negative.

## 2020-03-25 DIAGNOSIS — M5135 Other intervertebral disc degeneration, thoracolumbar region: Secondary | ICD-10-CM | POA: Diagnosis not present

## 2020-03-25 DIAGNOSIS — M4180 Other forms of scoliosis, site unspecified: Secondary | ICD-10-CM | POA: Diagnosis not present

## 2020-03-25 DIAGNOSIS — M4852XA Collapsed vertebra, not elsewhere classified, cervical region, initial encounter for fracture: Secondary | ICD-10-CM | POA: Diagnosis not present

## 2020-03-25 DIAGNOSIS — Z981 Arthrodesis status: Secondary | ICD-10-CM | POA: Diagnosis not present

## 2020-03-25 DIAGNOSIS — M4317 Spondylolisthesis, lumbosacral region: Secondary | ICD-10-CM | POA: Diagnosis not present

## 2020-03-25 DIAGNOSIS — M4854XA Collapsed vertebra, not elsewhere classified, thoracic region, initial encounter for fracture: Secondary | ICD-10-CM | POA: Diagnosis not present

## 2020-03-25 DIAGNOSIS — R29890 Loss of height: Secondary | ICD-10-CM | POA: Diagnosis not present

## 2020-03-25 DIAGNOSIS — Z4789 Encounter for other orthopedic aftercare: Secondary | ICD-10-CM | POA: Diagnosis not present

## 2020-03-25 DIAGNOSIS — M47812 Spondylosis without myelopathy or radiculopathy, cervical region: Secondary | ICD-10-CM | POA: Diagnosis not present

## 2020-03-25 DIAGNOSIS — M4312 Spondylolisthesis, cervical region: Secondary | ICD-10-CM | POA: Diagnosis not present

## 2020-04-05 ENCOUNTER — Encounter: Payer: Medicare Other | Admitting: Internal Medicine

## 2020-04-08 ENCOUNTER — Other Ambulatory Visit: Payer: Self-pay

## 2020-04-08 ENCOUNTER — Encounter: Payer: Self-pay | Admitting: Adult Health Nurse Practitioner

## 2020-04-08 ENCOUNTER — Ambulatory Visit (INDEPENDENT_AMBULATORY_CARE_PROVIDER_SITE_OTHER): Payer: Medicare Other | Admitting: Adult Health Nurse Practitioner

## 2020-04-08 VITALS — BP 126/80 | HR 65 | Temp 97.2°F | Resp 16 | Wt 134.0 lb

## 2020-04-08 DIAGNOSIS — Z79899 Other long term (current) drug therapy: Secondary | ICD-10-CM | POA: Diagnosis not present

## 2020-04-08 DIAGNOSIS — G20A1 Parkinson's disease without dyskinesia, without mention of fluctuations: Secondary | ICD-10-CM

## 2020-04-08 DIAGNOSIS — Z136 Encounter for screening for cardiovascular disorders: Secondary | ICD-10-CM

## 2020-04-08 DIAGNOSIS — M5416 Radiculopathy, lumbar region: Secondary | ICD-10-CM

## 2020-04-08 DIAGNOSIS — E559 Vitamin D deficiency, unspecified: Secondary | ICD-10-CM

## 2020-04-08 DIAGNOSIS — M8000XS Age-related osteoporosis with current pathological fracture, unspecified site, sequela: Secondary | ICD-10-CM

## 2020-04-08 DIAGNOSIS — K581 Irritable bowel syndrome with constipation: Secondary | ICD-10-CM

## 2020-04-08 DIAGNOSIS — R195 Other fecal abnormalities: Secondary | ICD-10-CM | POA: Diagnosis not present

## 2020-04-08 DIAGNOSIS — Z1389 Encounter for screening for other disorder: Secondary | ICD-10-CM | POA: Diagnosis not present

## 2020-04-08 DIAGNOSIS — K219 Gastro-esophageal reflux disease without esophagitis: Secondary | ICD-10-CM

## 2020-04-08 DIAGNOSIS — I10 Essential (primary) hypertension: Secondary | ICD-10-CM | POA: Diagnosis not present

## 2020-04-08 DIAGNOSIS — E782 Mixed hyperlipidemia: Secondary | ICD-10-CM

## 2020-04-08 DIAGNOSIS — G20 Parkinson's disease: Secondary | ICD-10-CM

## 2020-04-08 DIAGNOSIS — Z0000 Encounter for general adult medical examination without abnormal findings: Secondary | ICD-10-CM

## 2020-04-08 DIAGNOSIS — R7309 Other abnormal glucose: Secondary | ICD-10-CM

## 2020-04-08 DIAGNOSIS — R300 Dysuria: Secondary | ICD-10-CM | POA: Diagnosis not present

## 2020-04-08 DIAGNOSIS — R8290 Unspecified abnormal findings in urine: Secondary | ICD-10-CM

## 2020-04-08 DIAGNOSIS — B372 Candidiasis of skin and nail: Secondary | ICD-10-CM

## 2020-04-08 DIAGNOSIS — Z981 Arthrodesis status: Secondary | ICD-10-CM

## 2020-04-08 NOTE — Progress Notes (Signed)
COMPLETE PHYSICAL Assessment:    Alexandra Lindsey was seen today for annual exam.  Diagnoses and all orders for this visit:  Encounter for annual physical exam Yearly  Essential hypertension - continue medications, DASH diet, exercise and monitor at home. Call if greater than 130/80.  -     CBC with Differential/Platelet -     COMPLETE METABOLIC PANEL WITH GFR -     TSH -     Lipid panel  Hyperlipidemia, mixed Continue medications: Discussed dietary and exercise modifications Low fat diet -     Lipid panel  Parkinson disease (HCC) Continue current regiment Follows with neurology, Dr [name]  Vitamin D deficiency Continue supplementation Taking Vitamin D 3,000 IU BID  Abnormal glucose Discussed dietary and exercise modifications -     Hemoglobin A1c  Irritable bowel syndrome with constipation Doing well at this time  Gastroesophageal reflux disease, unspecified whether esophagitis present Doing well at this time Diet discussed Monitor for triggers Avoid food with high acid content Avoid excessive cafeine Increase water intake  Status post thoracic spinal fusion Lumbar radiculopathy Age-related osteoporosis with current pathological fracture, sequela Following with Dr Prince Rome for this Has discussed surgical intervention   Medication management Continued  Screening for cardiovascular condition -     EKG 12-Lead  Screening for blood or protein in urine -     Urinalysis w microscopic + reflex cultur  Candidiasis, intertrigo -     nystatin (MYCOSTATIN/NYSTOP) powder; Apply under breasts 2-3 times a day.  Other orders -     REFLEXIVE URINE CULTURE -     TEST AUTHORIZATION -     Urine Culture   Positive colorectal cancer screening using Cologuard test Declines further follow up Saw Dr. Amedeo Plenty, no colonoscopy   Prediabetes Discussed disease progression and risks Discussed diet/exercise, weight management and risk modification  RBD (REM behavioral  disorder) Continue meds  Over 40 minutes of face to face interview, exam, counseling, chart review, and critical decision making was performed  Future Appointments  Date Time Provider [place]  07/16/2020 10:45 AM [name], Eustace Quail, DO [facility] None  07/20/2020 10:30 AM Unk Pinto, MD [facility] None  04/19/2021 10:00 AM Garnet Sierras, NP [facility] None     Plan:   Subjective:  Alexandra Lindsey is a 79 y.o. female who presents for complete physical. She is accompanied by her husband who is her primary caregiver.    Follows closely with Dr. Hall Busing for advancing Parkinson's  Now treated by, 2 tablets at 5 AM, 2 tablets at 8 AM, 1 tablet at noon, 1 tablet at 4 PM, 1 tablet at 7 PM, 2 tablets at bedtime.   Off of mirapex due to hallucinations.  She is very unstable r/t Parkinson's with high fall risk, numerous falls with injury, recently transitioned from walker to Wheelchair per neuro recommendations.   She is seeing Dr Prince Rome related to multiple fractures and she is s/p thoracic spinal fusion.  She has current fractures and contemplating surgical intervention.  She is having increasing back pain.  She is in wheelchair today.  She does have an upright rolling walker but it not able to use it relate to this.  She also is having difficulties sleeping at night related to her back pain. She has x-ray on 03/25/20 Redemonstrated T9 compression fracture with approximately 30% height loss, similar to prior. Focal kyphosis is similar. 2. No acute bony abnormality. 3. Posterior spinal fusion extending from T10 to S1 with bilateral sacral screws and interbody  spacers at L3-L4, L4-L5, and L5-S1. Hardware is intact. The left T10 screw similarly projects superiorly through near the disc space with similar height loss of the T10 vertebral body. Question slight lucency surrounding the sacral screws. 4. Grade 2 anterolisthesis of L5 on S1 is similar to prior. 5. Moderate cervical spondylosis  from C4 to C7. Anterolisthesis of C3 on C4 measuring 4 mm. 6. Multilevel degenerative disc changes of the thoracolumbar spine. 7. Mild S-shaped scoliotic curvature.   She had a + cologuard saw Dr. Amedeo Plenty, declines further colonoscopy, no blood in stool.   BMI is Body mass index is 26.17 kg/m., she has not been working on diet and exercise. Wt Readings from Last 3 Encounters:  04/08/20 134 lb (60.8 kg)  02/12/20 138 lb (62.6 kg)  01/06/20 131 lb (59.4 kg)   Her blood pressure has been controlled at home, today their BP is BP: 126/80 She does workout. She denies chest pain, shortness of breath, dizziness.   She is not on cholesterol medication and denies myalgias. Her cholesterol is not at goal. The cholesterol last visit was:   Lab Results  Component Value Date   CHOL 211 (H) 04/08/2020   HDL 51 04/08/2020   LDLCALC 116 (H) 04/08/2020   TRIG 288 (H) 04/08/2020   CHOLHDL 4.1 04/08/2020   :  Lab Results  Component Value Date   HGBA1C 5.3 04/08/2020   Last GFR Lab Results  Component Value Date   GFRNONAA 77 04/08/2020    Patient is on Vitamin D supplement.   Lab Results  Component Value Date   VD25OH 87 01/06/2020        Medication Review: Current Outpatient Medications on File Prior to Visit  Medication Sig Dispense Refill  . acetaminophen (TYLENOL) 500 MG tablet Take 1,000 mg by mouth 2 (two) times daily as needed for moderate pain or headache.    . albuterol (PROVENTIL HFA;VENTOLIN HFA) 108 (90 Base) MCG/ACT inhaler Inhale 1-2 puffs into the lungs every 6 (six) hours as needed for wheezing or shortness of breath.    . AMBULATORY NON FORMULARY MEDICATION Lift chair Dx: G20 1 Device 0  . carbidopa-levodopa (SINEMET CR) 50-200 MG tablet Take 1 tablet by mouth at bedtime. 90 tablet 1  . carbidopa-levodopa (SINEMET IR) 25-100 MG tablet TAKE 3 TABLETS AT 5AM, TAKE 2 TABLETS AT9AM, TAKE 1 TABLET AT 1PM, TAKE 2 TABLETS AT 5PM, AND TAKE 1 TABLET AT NIGHT 300 tablet 1  .  Cholecalciferol (VITAMIN D3) 5000 units CAPS Take 5,000 Units by mouth 2 (two) times daily.    [name] dicyclomine (BENTYL) 20 MG tablet TAKE 1/2 TO 1 TABLET THREE TIMES PER DAY BEFORE MEALS FOR ABDOMNIALCRAMPING, BLOATING, DIARRHEA, OR NAUSEA 90 tablet 0  . hydrocortisone cream 1 % Apply 1 application topically daily as needed for itching.    . methocarbamol (ROBAXIN) 500 MG tablet Take by mouth.    . midodrine (PROAMATINE) 10 MG tablet Take 1/2-1 tab thee times daily for low blood pressure. 180 tablet 1  . NON FORMULARY Arthritis cream    . ondansetron (ZOFRAN-ODT) 4 MG disintegrating tablet Take 4 mg by mouth every 8 (eight) hours as needed for nausea or vomiting.    . senna (SENOKOT) 8.6 MG tablet Take 1 tablet by mouth 2 (two) times daily.     . sertraline (ZOLOFT) 100 MG tablet Takes 1.5 tablet at night (Patient taking differently: Takes 1 tablet at night) 120 tablet 3  . vitamin B-12 (CYANOCOBALAMIN) 500 MCG  tablet Take 500 mcg by mouth daily.    Debby Freiberg Tartrate (NUPLAZID) 34 MG CAPS Take 1 capsule (34 mg total) by mouth daily. (Patient not taking: Reported on 04/08/2020) 90 capsule 1  . rosuvastatin (CRESTOR) 20 MG tablet Take 1 tablet daily for Cholesterol (Patient not taking: Reported on 04/08/2020) 90 tablet 1   No current facility-administered medications on file prior to visit.    Allergies: Allergies  Allergen Reactions  . Macrobid [Nitrofurantoin Macrocrystal] Swelling and Rash  . Baclofen Other (See Comments)    Kept her awake, affected memory  . Cephalosporins Other (See Comments)    Cannot sleep  . Codeine Nausea Only    dizziness  . Darvocet [Propoxyphene N-Acetaminophen] Nausea And Vomiting  . Diclofenac Rash    The gel caused a rash  . Levaquin [Levofloxacin] Hives  . Penicillins Rash  . Selegiline Hcl Rash  . Tramadol Nausea And Vomiting  . Triamcinolone Itching    Current Problems (verified) has Parkinson disease ([place]); RBD (REM behavioral disorder);  Hyperlipidemia; Hypertension; GERD (gastroesophageal reflux disease); Abnormal glucose; Vitamin D deficiency; Medication management; SI (sacroiliac) pain; Chronic pain syndrome; Lumbar radiculopathy; Lumbar stenosis with neurogenic claudication; Positive colorectal cancer screening using Cologuard test; Frequent falls; S/P spinal fusion; Postlaminectomy syndrome, not elsewhere classified; and Osteoporosis on their problem list.  Screening Tests Immunization History  Administered Date(s) Administered  . Influenza, High Dose Seasonal PF 09/08/2014, 10/04/2015, 09/02/2019  . Influenza-Unspecified 09/29/2016, 08/08/2017, 08/22/2018  . PFIZER SARS-COV-2 Vaccination 12/22/2019, 01/23/2020  . Pneumococcal Conjugate-13 11/10/2015  . Pneumococcal Polysaccharide-23 02/26/2017  . Pneumococcal-Unspecified 11/27/2006  . Td 09/08/2013  . Zoster 06/27/2006    Preventative care: Last colonoscopy: 2008 DEXA: 2019, osteoporosis, alendronate failed  Mammogram: 09/2017, Dr. Ulanda Edison  Ct lumbar 09/2017 cologuard 02/2017 saw Dr. Amedeo Plenty will just monitor  Names of Other Physician/Practitioners you currently use: 1. [facility] here for primary care 2. Dr. Sabra Heck, eye doctor, last visit 2020, has reading glasses 3. Dr. Kathryne Sharper, dentist, last visit 11/2019, goes q33m  Patient Care Team: Unk Pinto, MD as PCP - General (Internal Medicine) Teena Irani, MD (Inactive) as Consulting Physician (Gastroenterology) Troy Sine, MD as Consulting Physician (Cardiology) Lyndal Pulley, DO as Attending Physician (Family Medicine) [name], Eustace Quail, DO as Consulting Physician (Neurology) Marybelle Killings, MD as Consulting Physician (Orthopedic Surgery)  Surgical: She  has a past surgical history that includes Abdominal hysterectomy; Hernia repair; Rotator cuff repair (Left, 10-07-12); Bunionectomy (Bilateral, 11-11-3); Cardiac catheterization (Bilateral, 2013); left heart  catheterization with coronary angiogram (N/A, 07/03/2012); Appendectomy; Colonoscopy; Back surgery (12/20/2015); Repair of cerebrospinal fluid leak (N/A, 12/24/2015); Back surgery (09/27/2017); and Back surgery (2020). Family Her family history includes CVA in her brother; Esophageal cancer in her mother; Heart attack in her mother; Hypercholesterolemia in her brother; Hypertension in her brother; Mental retardation in her brother; Suicidality in her brother and father. Social history  She reports that she has never smoked. She has never used smokeless tobacco. She reports that she does not drink alcohol or use drugs.      Objective:   Today's Vitals   04/08/20 1012  BP: 126/80  Pulse: 65  Resp: 16  Temp: (!) 97.2 F (36.2 C)  SpO2: 97%  Weight: 134 lb (60.8 kg)   Body mass index is 26.17 kg/m.  General appearance: alert, no distress, WD/WN, female HEENT: normocephalic, sclerae anicteric, TMs pearly, nares patent, no discharge or erythema, pharynx normal, Oral cavity: MMM, no lesions Neck: supple,  no lymphadenopathy, no thyromegaly, no masses Heart: RRR, normal S1, S2, no murmurs Lungs: CTA bilaterally, no wheezes, rhonchi, or rales Abdomen: +bs, distended soft, non tender, slightly distended, no masses, no hepatomegaly, no splenomegaly Musculoskeletal: Obvious bony enlargement at MCP, DIP, PIP joints without erythema, heat, effusion, with mild ulnar deviation deformity bilaterally; Scoliosis, hunched over in wheelchair. Skin: Free of any rashes, lesions, or ecchymosis.  Erythematous skin with white exudate, intertrigo bilateral breasts, worse on right. Extremities: no edema, no cyanosis, no clubbing Pulses: 2+ symmetric, upper and lower extremities, normal cap refill Neurological: alert, oriented x 3, CN2-12 intact, strength decreased upper extremities and lower extremities, sensation normal throughout, has baseline tremor, mild mask facies. In wheelchair, gait not assessed.    Psychiatric: normal affect, behavior normal, pleasant    EKG: NSR *sitting in wheelchair, unable to lay for this.   Garnet Sierras, NP   04/06/2020

## 2020-04-09 LAB — COMPLETE METABOLIC PANEL WITH GFR
AG Ratio: 2.3 (calc) (ref 1.0–2.5)
ALT: 11 U/L (ref 6–29)
AST: 18 U/L (ref 10–35)
Albumin: 4.4 g/dL (ref 3.6–5.1)
Alkaline phosphatase (APISO): 136 U/L (ref 37–153)
BUN/Creatinine Ratio: 40 (calc) — ABNORMAL HIGH (ref 6–22)
BUN: 30 mg/dL — ABNORMAL HIGH (ref 7–25)
CO2: 26 mmol/L (ref 20–32)
Calcium: 9.5 mg/dL (ref 8.6–10.4)
Chloride: 107 mmol/L (ref 98–110)
Creat: 0.75 mg/dL (ref 0.60–0.93)
GFR, Est African American: 89 mL/min/{1.73_m2} (ref >=60)
GFR, Est Non African American: 77 mL/min/{1.73_m2} (ref >=60)
Globulin: 1.9 g/dL (calc) (ref 1.9–3.7)
Glucose, Bld: 97 mg/dL (ref 65–99)
Potassium: 4.4 mmol/L (ref 3.5–5.3)
Sodium: 141 mmol/L (ref 135–146)
Total Bilirubin: 0.4 mg/dL (ref 0.2–1.2)
Total Protein: 6.3 g/dL (ref 6.1–8.1)

## 2020-04-09 LAB — CBC WITH DIFFERENTIAL/PLATELET
Absolute Monocytes: 395 cells/uL (ref 200–950)
Basophils Absolute: 31 cells/uL (ref 0–200)
Basophils Relative: 0.6 %
Eosinophils Absolute: 151 cells/uL (ref 15–500)
Eosinophils Relative: 2.9 %
HCT: 36.2 % (ref 35.0–45.0)
Hemoglobin: 11.8 g/dL (ref 11.7–15.5)
Lymphs Abs: 1035 cells/uL (ref 850–3900)
MCH: 30.3 pg (ref 27.0–33.0)
MCHC: 32.6 g/dL (ref 32.0–36.0)
MCV: 93.1 fL (ref 80.0–100.0)
MPV: 10.9 fL (ref 7.5–12.5)
Monocytes Relative: 7.6 %
Neutro Abs: 3588 cells/uL (ref 1500–7800)
Neutrophils Relative %: 69 %
Platelets: 275 10*3/uL (ref 140–400)
RBC: 3.89 10*6/uL (ref 3.80–5.10)
RDW: 12.7 % (ref 11.0–15.0)
Total Lymphocyte: 19.9 %
WBC: 5.2 10*3/uL (ref 3.8–10.8)

## 2020-04-09 LAB — LIPID PANEL
Cholesterol: 211 mg/dL — ABNORMAL HIGH (ref <200)
HDL: 51 mg/dL (ref >=50)
LDL Cholesterol (Calc): 116 mg/dL (calc) — ABNORMAL HIGH
Non-HDL Cholesterol (Calc): 160 mg/dL (calc) — ABNORMAL HIGH (ref <130)
Total CHOL/HDL Ratio: 4.1 (calc) (ref <5.0)
Triglycerides: 288 mg/dL — ABNORMAL HIGH (ref <150)

## 2020-04-09 LAB — MAGNESIUM: Magnesium: 2.2 mg/dL (ref 1.5–2.5)

## 2020-04-09 LAB — HEMOGLOBIN A1C
Hgb A1c MFr Bld: 5.3 % of total Hgb (ref <5.7)
Mean Plasma Glucose: 105 (calc)
eAG (mmol/L): 5.8 (calc)

## 2020-04-09 LAB — TSH: TSH: 0.46 mIU/L (ref 0.40–4.50)

## 2020-04-09 MED ORDER — NYSTATIN 100000 UNIT/GM EX POWD: CUTANEOUS | 2 refills | Status: DC

## 2020-04-12 LAB — URINALYSIS W MICROSCOPIC + REFLEX CULTURE
Bilirubin Urine: NEGATIVE
Glucose, UA: NEGATIVE
Hgb urine dipstick: NEGATIVE
Hyaline Cast: NONE SEEN /LPF
Ketones, ur: NEGATIVE
Leukocyte Esterase: NEGATIVE
Nitrites, Initial: NEGATIVE
Protein, ur: NEGATIVE
RBC / HPF: NONE SEEN /HPF (ref 0–2)
Specific Gravity, Urine: 1.012 (ref 1.001–1.03)
Squamous Epithelial / HPF: NONE SEEN /HPF (ref <=5)
pH: 6 (ref 5.0–8.0)

## 2020-04-12 LAB — URINE CULTURE
MICRO NUMBER:: 10478930
SPECIMEN QUALITY:: ADEQUATE

## 2020-04-12 LAB — TEST AUTHORIZATION

## 2020-04-12 LAB — NO CULTURE INDICATED

## 2020-04-13 ENCOUNTER — Other Ambulatory Visit: Payer: Self-pay | Admitting: Adult Health Nurse Practitioner

## 2020-04-13 MED ORDER — SULFAMETHOXAZOLE-TRIMETHOPRIM 800-160 MG PO TABS
1.0000 | ORAL_TABLET | Freq: Two times a day (BID) | ORAL | 0 refills | Status: DC
Start: 2020-04-13 — End: 2020-05-04

## 2020-04-14 NOTE — Patient Instructions (Signed)
declined

## 2020-04-27 ENCOUNTER — Other Ambulatory Visit: Payer: Self-pay | Admitting: Adult Health Nurse Practitioner

## 2020-04-27 DIAGNOSIS — N3000 Acute cystitis without hematuria: Secondary | ICD-10-CM

## 2020-05-04 ENCOUNTER — Other Ambulatory Visit: Payer: Self-pay

## 2020-05-04 ENCOUNTER — Encounter: Payer: Self-pay | Admitting: Adult Health Nurse Practitioner

## 2020-05-04 ENCOUNTER — Ambulatory Visit (INDEPENDENT_AMBULATORY_CARE_PROVIDER_SITE_OTHER): Payer: Medicare Other | Admitting: Adult Health Nurse Practitioner

## 2020-05-04 ENCOUNTER — Other Ambulatory Visit: Payer: Self-pay | Admitting: Neurology

## 2020-05-04 VITALS — BP 110/70 | HR 74 | Temp 93.0°F | Wt 136.0 lb

## 2020-05-04 DIAGNOSIS — N3000 Acute cystitis without hematuria: Secondary | ICD-10-CM | POA: Diagnosis not present

## 2020-05-04 DIAGNOSIS — R350 Frequency of micturition: Secondary | ICD-10-CM

## 2020-05-04 DIAGNOSIS — N390 Urinary tract infection, site not specified: Secondary | ICD-10-CM

## 2020-05-04 DIAGNOSIS — R21 Rash and other nonspecific skin eruption: Secondary | ICD-10-CM

## 2020-05-04 DIAGNOSIS — B372 Candidiasis of skin and nail: Secondary | ICD-10-CM

## 2020-05-04 MED ORDER — CIPROFLOXACIN HCL 500 MG PO TABS: 500.0000 mg | ORAL_TABLET | Freq: Two times a day (BID) | ORAL | 0 refills | Status: AC

## 2020-05-04 NOTE — Progress Notes (Signed)
Assessment and Plan:  Alexandra Lindsey was seen today for follow-up, bloated, fall and back pain.  Diagnoses and all orders for this visit:  Acute cystitis without hematuria Frequency of urination -     Urinalysis w microscopic + reflex cultur  Complicated UTI (urinary tract infection) Increase water intake, discussed at length May continue Azo for two more days. -     ciprofloxacin (CIPRO) 500 MG tablet; Take 1 tablet (500 mg total) by mouth 2 (two) times daily for 7 days.  Rash of body STOP using diclofenac gel on chest, likely worsening symptoms Use hydrocortisone topical cream to area.  Candidiasis, intertrigo Has Nystatin topical powder, under breasts, bilateral Improved from last OV Continue until resolved. Discussed barrier between skin to prevent breakdown once healed Will continue to monitor   Further disposition pending results of labs. Discussed med's effects and SE's.   Over 30 minutes of face to face exam, counseling, chart review, and critical decision making was performed.   Future Appointments  Date Time Provider [place]  07/16/2020 10:45 AM [name], Eustace Quail, DO [facility] None  07/20/2020 11:00 AM Unk Pinto, MD [facility] None  04/19/2021 10:00 AM Garnet Sierras, NP [facility] None    ------------------------------------------------------------------------------------------------------------------   HPI 79 y.o.female presents for follow up after positive UA from OV on 04/08/20.  She was prescribed bactrim and reports she has completed this medication without any adverse reactions.  She is accompanied with her husband today and is in a wheelchair.  She reports that continues to have the similar symptoms of frequency.  She reports on Sunday, three days ago they went to the pharmacy and she started taking AZO which helped with her symptoms. She continues to have urgency and frequency.  Her husband reports she does not drink enough water but he has been  encouraging her to do so.  She continues to report back pain especially when she lays down to sleep at night.  She is follow with ortho, Dr Prince Rome for this. She denies any nausea, vomiting, abdominal pains, constipation or diarrhea, chest pains or shortness of breath.  She has a notable rash to her upper chest.  This was mild at last OV.  This has worsened and she reports she has been putting diclofenac gel to the area, at least twice a day.  She reports this has not been helping.  Last OV she has candidasis under bilateral breast.  She reports that the nystatin powder has helped with this and it is almost resolved. Past Medical History:  Diagnosis Date  . Anemia    as young girl and teenager  . Arthritis   . Chest pain, unspecified   . Constipation   . Costochondritis   . GERD (gastroesophageal reflux disease)   . Headache    ocular migraines  . History of hiatal hernia   . Hyperlipidemia 10-07-12  . Never smoked tobacco   . Parkinson's disease ([place]) 10-07-12  . Parkinson's disease ([place])   . Prediabetes   . 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 Spinal stenosis      Allergies  Allergen Reactions  . Macrobid [Nitrofurantoin Macrocrystal] Swelling and Rash  . Baclofen Other (See Comments)    Kept her awake, affected memory  . Cephalosporins Other (See Comments)    Cannot sleep  . Codeine Nausea Only    dizziness  . Darvocet [Propoxyphene N-Acetaminophen] Nausea And Vomiting  . Diclofenac Rash    The gel caused a rash  . Levaquin [Levofloxacin] Hives  .  Penicillins Rash  . Selegiline Hcl Rash  . Tramadol Nausea And Vomiting  . Triamcinolone Itching    Current Outpatient Medications on File Prior to Visit  Medication Sig  . acetaminophen (TYLENOL) 500 MG tablet Take 1,000 mg by mouth 2 (two) times daily as needed for moderate pain or headache.  . albuterol (PROVENTIL HFA;VENTOLIN HFA) 108 (90 Base) MCG/ACT inhaler Inhale 1-2 puffs into the lungs every 6 (six) hours  as needed for wheezing or shortness of breath.  . AMBULATORY NON FORMULARY MEDICATION Lift chair Dx: G20  . carbidopa-levodopa (SINEMET CR) 50-200 MG tablet Take 1 tablet by mouth at bedtime.  . carbidopa-levodopa (SINEMET IR) 25-100 MG tablet TAKE 3 TABLETS AT 5AM, TAKE 2 TABLETS AT9AM, TAKE 1 TABLET AT 1PM, TAKE 2 TABLETS AT 5PM, AND TAKE 1 TABLET AT NIGHT  . Cholecalciferol (VITAMIN D3) 5000 units CAPS Take 5,000 Units by mouth 2 (two) times daily.  [name] dicyclomine (BENTYL) 20 MG tablet TAKE 1/2 TO 1 TABLET THREE TIMES PER DAY BEFORE MEALS FOR ABDOMNIALCRAMPING, BLOATING, DIARRHEA, OR NAUSEA  . hydrocortisone cream 1 % Apply 1 application topically daily as needed for itching.  . methocarbamol (ROBAXIN) 500 MG tablet Take by mouth.  . midodrine (PROAMATINE) 10 MG tablet Take 1/2-1 tab thee times daily for low blood pressure.  . NON FORMULARY Arthritis cream  . nystatin (MYCOSTATIN/NYSTOP) powder Apply under breasts 2-3 times a day.  . ondansetron (ZOFRAN-ODT) 4 MG disintegrating tablet Take 4 mg by mouth every 8 (eight) hours as needed for nausea or vomiting.  Debby Freiberg Tartrate (NUPLAZID) 34 MG CAPS Take 1 capsule (34 mg total) by mouth daily.  . rosuvastatin (CRESTOR) 20 MG tablet Take 1 tablet daily for Cholesterol  . senna (SENOKOT) 8.6 MG tablet Take 1 tablet by mouth 2 (two) times daily.   . sertraline (ZOLOFT) 100 MG tablet Takes 1.5 tablet at night (Patient taking differently: Takes 1 tablet at night)  . vitamin B-12 (CYANOCOBALAMIN) 500 MCG tablet Take 500 mcg by mouth daily.   No current facility-administered medications on file prior to visit.    ROS: all negative except above.   Physical Exam:  BP 110/70   Pulse 74   Temp (!) 93 F (33.9 C)   Wt 136 lb (61.7 kg)   SpO2 93%   BMI 26.56 kg/m   General Appearance: Well nourished, in no apparent distress. Eyes: PERRLA, EOMs, conjunctiva no swelling or erythema Respiratory: Respiratory effort normal, BS equal  bilaterally without rales, rhonchi, wheezing or stridor.  Cardio: RRR with no MRGs. Brisk peripheral pulses without edema.  Abdomen: Soft, + BS.  Non tender, no guarding, rebound, hernias, masses. Lymphatics: Non tender without lymphadenopathy.  Musculoskeletal: Full ROM, 5/5 strength, normal gait.  Skin: Warm, dry lesions, ecchymosis. Erythematous rash isolated to upper chest. Neuro: Cranial nerves intact. Normal muscle tone, no cerebellar symptoms. Sensation intact.  Psych: Awake and oriented X 3, normal affect, Insight and Judgment baseline. Mild cognitive impairment.    Garnet Sierras, NP 10:12 AM [facility]

## 2020-05-04 NOTE — Patient Instructions (Signed)
   Start the Ciprofloxacin to help with the urinary symptoms.  We will culture the urine to see if there is bacteria causing your symptoms.  Increase the amount of water you drink, at least three of your water bottles a day.   STOP putting diclofenac gel on chest.  Use cortisone cream to the area to help with itching.

## 2020-05-06 LAB — URINALYSIS W MICROSCOPIC + REFLEX CULTURE
Bacteria, UA: NONE SEEN /HPF
Bilirubin Urine: NEGATIVE
Glucose, UA: NEGATIVE
Hgb urine dipstick: NEGATIVE
Hyaline Cast: NONE SEEN /LPF
Ketones, ur: NEGATIVE
Nitrites, Initial: POSITIVE — AB
RBC / HPF: NONE SEEN /HPF (ref 0–2)
Specific Gravity, Urine: 1.038 — ABNORMAL HIGH (ref 1.001–1.03)
Squamous Epithelial / HPF: NONE SEEN /HPF (ref <=5)
WBC, UA: NONE SEEN /HPF (ref 0–5)
pH: <=5 (ref 5.0–8.0)

## 2020-05-06 LAB — URINE CULTURE
MICRO NUMBER:: 10570736
SPECIMEN QUALITY:: ADEQUATE

## 2020-05-06 LAB — CULTURE INDICATED

## 2020-05-07 ENCOUNTER — Other Ambulatory Visit: Payer: Self-pay | Admitting: Internal Medicine

## 2020-05-07 DIAGNOSIS — K581 Irritable bowel syndrome with constipation: Secondary | ICD-10-CM

## 2020-05-07 DIAGNOSIS — K582 Mixed irritable bowel syndrome: Secondary | ICD-10-CM

## 2020-05-10 ENCOUNTER — Encounter: Payer: Medicare Other | Admitting: Internal Medicine

## 2020-06-15 DIAGNOSIS — K5900 Constipation, unspecified: Secondary | ICD-10-CM | POA: Diagnosis not present

## 2020-06-15 DIAGNOSIS — R140 Abdominal distension (gaseous): Secondary | ICD-10-CM | POA: Diagnosis not present

## 2020-06-18 ENCOUNTER — Telehealth: Payer: Self-pay | Admitting: Neurology

## 2020-06-18 NOTE — Telephone Encounter (Signed)
Patient called in and left a message. She has been feeling worse and would like to know what she should do.

## 2020-06-18 NOTE — Telephone Encounter (Signed)
Spoke with pt and let her know that Dr Delice Lesch reviewed Dr. Doristine Devoid last note. Dr. Carles Collet had mentioned that youcan try taking carbidopa 25/100: 2 in AM, then 2 at 9AM, 2 at 1PM, 2 at [place] and see if this works better. Continue 50/200 at bedtime. See how she feels on this over the next week, then call back on Aug 2 when Dr. Carles Collet is back for further instructions. She verbalized understanding and will try this dosing schedule.

## 2020-06-18 NOTE — Telephone Encounter (Signed)
Pls let her know I reviewed Dr. Doristine Devoid last note. Dr. Carles Collet had mentioned that she can try taking carbidopa 25/100: 2 in AM, then 2 at 9AM, 2 at 1PM, 2 at [place] and see if this works better. Continue 50/200 at bedtime. Can see how she feels on this over the next week, then call back on Aug 2 when Dr. Carles Collet is back for further instructions. Thanks

## 2020-06-18 NOTE — Telephone Encounter (Signed)
Spoke with pt who called in because she is feeling worse. Is taking carbidopa/levodopa 25/100, 3 tablets at 5 AM/2 tablets at 9 AM/1 tablet at 1 PM/2 tablets at 5 PM and 50/200 at HS. She says she is often lightheaded (BPs have been okay) and more trouble moving and getting around with worsening dyskinesia of her hands/arms. She has not gotten a wheelchair as suggested at her last visits. Has her next appt 07/16/2020. Told her I would put her on the wait list for an earlier appt and pass on her concerns to the covering physician, she verbalized understanding.

## 2020-06-21 ENCOUNTER — Other Ambulatory Visit: Payer: Self-pay | Admitting: Adult Health Nurse Practitioner

## 2020-06-21 DIAGNOSIS — N390 Urinary tract infection, site not specified: Secondary | ICD-10-CM

## 2020-06-22 ENCOUNTER — Other Ambulatory Visit: Payer: Self-pay | Admitting: Adult Health Nurse Practitioner

## 2020-06-22 DIAGNOSIS — N3000 Acute cystitis without hematuria: Secondary | ICD-10-CM

## 2020-06-22 MED ORDER — CIPROFLOXACIN HCL 500 MG PO TABS: 500.0000 mg | ORAL_TABLET | Freq: Two times a day (BID) | ORAL | 0 refills | Status: AC

## 2020-06-24 NOTE — Telephone Encounter (Signed)
Noted  

## 2020-06-29 ENCOUNTER — Other Ambulatory Visit: Payer: Self-pay | Admitting: Neurology

## 2020-06-29 NOTE — Telephone Encounter (Signed)
Rx(s) sent to pharmacy electronically.  

## 2020-06-30 ENCOUNTER — Telehealth: Payer: Self-pay

## 2020-06-30 NOTE — Telephone Encounter (Signed)
SPOKE with patient & she states that she will reach out to the ORTHOPEDIST.  And has finished the ABX today & will call the office to schedule her a 1 mth follow up visit.

## 2020-06-30 NOTE — Telephone Encounter (Signed)
-----   Message from Garnet Sierras, NP sent at 06/29/2020  2:11 PM EDT ----- Regarding: RE: CONCERNS Please let patient know that likely this is related to fractures in her back.  She has seen an orthopedist for this.  Best course of action would be to contact orthopedics to see if there are alternative treatments besides surgery.  Sincerely,           Garnet Sierras, NP  ----- Message ----- From: Elenor Quinones, CMA Sent: 06/25/2020   9:21 AM EDT To: Garnet Sierras, NP Subject: CONCERNS                                       Mrs. Donna has requested a MRI or CAT scan of ABD/BACK due to the pain she is having. She reports that she is having a hard time getting up & down. She would like to speak with you if she could because she would like to explain to the provider as to what is happening.

## 2020-07-13 ENCOUNTER — Telehealth: Payer: Self-pay | Admitting: Neurology

## 2020-07-13 ENCOUNTER — Telehealth: Payer: Self-pay

## 2020-07-13 NOTE — Telephone Encounter (Signed)
Patient called and requested a call back from a nurse. She said, "I've been feeling really bad over the last few month with my Parkinson's, I'm not sure I can keep the appointment at the end of this week and I'd like some recommendations."   Patient declined to do a virtual visit and didn't want to cancel her upcoming appointment until speaking with a nurse.

## 2020-07-13 NOTE — Telephone Encounter (Signed)
Spoke with pt who has multiple complaints that she feels are GI related, states she's seen the GI doctor but isn't better, told her she should contact her GI doctor to provide further bowel management. She has an appointment on 8/20 that she doesn't think she can keep it, declines virtual visit because of lack of access. Husband in background told her that he'd get her here on Friday because she needs to be seen. Encouraged her again to seek contact with GI doctor for her bowel concerns, she verbalized understanding.

## 2020-07-14 NOTE — Progress Notes (Signed)
Assessment/Plan:   1.  Parkinsons Disease  -Cautiously increase carbidopa/levodopa 25/100, 2 at 5am/1 at 7am/2 at 9am/1 at 11am/ 2 at 1pm/ 1 at 3pm/1 at [place] husband to watch for increased hallucinations  -Continue carbidopa/levodopa 50/200 at bedtime  -Multiple discussions with the patient over the last several years about the fact that she should not be walking and should stay seated at all times due to multiple falls.  -talked with patient again re: pramipexole.  She hallucinated with that med (more) and discussed again that it is never worth sacrificing cognition for motor control.  Have offered her 2nd opinions as well which have been declined.  2.  PDD with Parkinson's hallucinations  -pt didn't take nuplazid or thought it made her walking worse which wouldn't make sense  3.  Neurogenic Orthostatic Hypotension  -On midodrine, 10 mg 3 times per day but pt states that monitoring and only taking prn Subjective:   Alexandra Lindsey was seen today in follow up for Parkinsons disease.  My previous records were reviewed prior to todays visit as well as outside records available to me.   This patient is accompanied in the office by her husband who supplements the history.  Pt still with lightheadedness, but blood pressure has not been low.  Started nuplazid last visit but pt reports not taking it.   Patient saw primary care in June for E. coli urinary tract infection.  Wants to go back on pramipexole.  "I'm horrible ever since going off of it."  Admits to continued hallucinations but states that they are not as bad as they used to be - "I see people in the yard."  Husband states that she saw a man in the home yesterday and there is "one lady that is in and out."  When asked what is "horrible" she states that it is lack of energy and more freezing episodes.  She is falling less over the last few months but is freezing more  Current prescribed movement disorder medications: Carbidopa/levodopa  25/100, 3 tablets at 5 AM/2 tablets at 9 AM/1 tablet at 1 PM/2 tablets at 5 PM (did tell her that she could try taking 2 tablets in the morning and 6 others evenly throughout the day, but did not do that) - 2 at 5am/2 at 9am/2 at 1pm/2 at 5pm at  Carbidopa/levodopa 50/200 at bedtime Midodrine 10 mg 3 times per day (states that only taking prn based on BP) Nuplazid, 34 mg daily started last visit  PREVIOUS MEDICATIONS: Pramipexole (stopped because of hallucinations); stalevo; klonopin; rytary; nuplazid (? If took it)  ALLERGIES:   Allergies  Allergen Reactions  . Macrobid [Nitrofurantoin Macrocrystal] Swelling and Rash  . Baclofen Other (See Comments)    Kept her awake, affected memory  . Cephalosporins Other (See Comments)    Cannot sleep  . Codeine Nausea Only    dizziness  . Darvocet [Propoxyphene N-Acetaminophen] Nausea And Vomiting  . Diclofenac Rash    The gel caused a rash  . Levaquin [Levofloxacin] Hives  . Penicillins Rash  . Selegiline Hcl Rash  . Tramadol Nausea And Vomiting  . Triamcinolone Itching    CURRENT MEDICATIONS:  Outpatient Encounter Medications as of 07/16/2020  Medication Sig  . acetaminophen (TYLENOL) 500 MG tablet Take 1,000 mg by mouth 3 (three) times daily.   [name] albuterol (PROVENTIL HFA;VENTOLIN HFA) 108 (90 Base) MCG/ACT inhaler Inhale 1-2 puffs into the lungs every 6 (six) hours as needed for wheezing or  shortness of breath.  . AMBULATORY NON FORMULARY MEDICATION Lift chair Dx: G20  . carbidopa-levodopa (SINEMET CR) 50-200 MG tablet Take 1 tablet by mouth at bedtime.  . carbidopa-levodopa (SINEMET IR) 25-100 MG tablet Take 2 tabs at 5am/2 tabs at 9am/ 2 tabs at 1pm/ 2 tabs at 5pm  . Cholecalciferol (VITAMIN D3) 5000 units CAPS Take 5,000 Units by mouth 2 (two) times daily.  [name] dicyclomine (BENTYL) 20 MG tablet TAKE 1/2 TO 1 TABLET THREE TIMES PER DAY BEFORE MEALS FOR ABDOMNIALCRAMPING, BLOATING, DIARRHEA, OR NAUSEA  . hydrocortisone cream 1 % Apply 1  application topically daily as needed for itching.  . methocarbamol (ROBAXIN) 500 MG tablet Take by mouth.  . midodrine (PROAMATINE) 10 MG tablet Take 1/2-1 tab thee times daily for low blood pressure.  . NON FORMULARY Arthritis cream  . nystatin (MYCOSTATIN/NYSTOP) powder Apply under breasts 2-3 times a day.  . ondansetron (ZOFRAN-ODT) 4 MG disintegrating tablet Take 4 mg by mouth every 8 (eight) hours as needed for nausea or vomiting.  . senna (SENOKOT) 8.6 MG tablet Take 1 tablet by mouth 2 (two) times daily.   . sertraline (ZOLOFT) 100 MG tablet Takes 1.5 tablet at night (Patient taking differently: Takes 1 tablet at night)  . vitamin B-12 (CYANOCOBALAMIN) 500 MCG tablet Take 500 mcg by mouth daily.  . [DISCONTINUED] Pimavanserin Tartrate (NUPLAZID) 34 MG CAPS Take 1 capsule (34 mg total) by mouth daily. (Patient not taking: Reported on 07/16/2020)  . [DISCONTINUED] rosuvastatin (CRESTOR) 20 MG tablet Take 1 tablet daily for Cholesterol (Patient not taking: Reported on 07/16/2020)   No facility-administered encounter medications on file as of 07/16/2020.    Objective:   PHYSICAL EXAMINATION:    VITALS:   Vitals:   07/16/20 1044  BP: 113/73  Pulse: 96  SpO2: 98%  Weight: 140 lb (63.5 kg)  Height: 5' (1.524 m)    GEN:  The patient appears stated age and is in NAD. HEENT:  Normocephalic, atraumatic.  The mucous membranes are moist. The superficial temporal arteries are without ropiness or tenderness. CV:  RRR Lungs:  CTAB Neck/HEME:  There are no carotid bruits bilaterally.  Neurological examination:  Orientation: The patient is alert and oriented x3. Cranial nerves: There is good facial symmetry with facial hypomimia. The speech is fluent and clear.  She is hypophonic.  Soft palate rises symmetrically and there is no tongue deviation. Hearing is intact to conversational tone. Sensation: Sensation is intact to light touch throughout Motor: Strength is at least antigravity  x4.  Movement examination: Tone: There is normal tone in the UE/LE Abnormal movements: none Coordination:  There is  decremation with RAM's,  Gait and Station: given a walker.  Helped up with assist.  Walks fairly well with walker with someone right behind her  I have reviewed and interpreted the following labs independently    Chemistry      Component Value Date/Time   NA 141 04/08/2020 1128   K 4.4 04/08/2020 1128   CL 107 04/08/2020 1128   CO2 26 04/08/2020 1128   BUN 30 (H) 04/08/2020 1128   CREATININE 0.75 04/08/2020 1128      Component Value Date/Time   CALCIUM 9.5 04/08/2020 1128   ALKPHOS 94 11/17/2016 1154   AST 18 04/08/2020 1128   ALT 11 04/08/2020 1128   BILITOT 0.4 04/08/2020 1128       Lab Results  Component Value Date   WBC 5.2 04/08/2020   HGB 11.8 04/08/2020  HCT 36.2 04/08/2020   MCV 93.1 04/08/2020   PLT 275 04/08/2020    Lab Results  Component Value Date   TSH 0.46 04/08/2020     Total time spent on today's visit was 45 minutes, including both face-to-face time and nonface-to-face time.  Time included that spent on review of records (prior notes available to me/labs/imaging if pertinent), discussing treatment and goals, answering patient's questions and coordinating care.  Cc:  Unk Pinto, MD

## 2020-07-16 ENCOUNTER — Ambulatory Visit (INDEPENDENT_AMBULATORY_CARE_PROVIDER_SITE_OTHER): Payer: Medicare Other | Admitting: Neurology

## 2020-07-16 ENCOUNTER — Encounter: Payer: Self-pay | Admitting: Neurology

## 2020-07-16 ENCOUNTER — Other Ambulatory Visit: Payer: Self-pay

## 2020-07-16 VITALS — BP 113/73 | HR 96 | Ht 60.0 in | Wt 140.0 lb

## 2020-07-16 DIAGNOSIS — R441 Visual hallucinations: Secondary | ICD-10-CM | POA: Diagnosis not present

## 2020-07-16 DIAGNOSIS — G903 Multi-system degeneration of the autonomic nervous system: Secondary | ICD-10-CM | POA: Diagnosis not present

## 2020-07-16 DIAGNOSIS — G20 Parkinson's disease: Secondary | ICD-10-CM | POA: Diagnosis not present

## 2020-07-16 MED ORDER — CARBIDOPA-LEVODOPA 25-100 MG PO TABS: ORAL_TABLET | ORAL | 1 refills | Status: DC

## 2020-07-16 NOTE — Patient Instructions (Signed)
Take carbidopa/levodopa 25/100, 2 at 5am/1 at 7am/2 at 9am/1 at 11am/ 2 at 1pm/ 1 at 3pm/1 at 5pm Take carbidopa/levodopa 50/200 cr at bedtime

## 2020-07-19 ENCOUNTER — Encounter: Payer: Self-pay | Admitting: Internal Medicine

## 2020-07-19 NOTE — Progress Notes (Signed)
History of Present Illness:       This very nice 79 y.o.  MWF  presents for 3 month follow up with HTN, HLD, Pre-Diabetes and Vitamin D Deficiency.  Patient has long standing Parkinson's Disease followed by Neurologist - Dr [name]. Patient has hx/o Unstable Gait and hx/o multiple falls. Patient has consequently been advised to  limit mobility to wheelchair only.       Patient is treated for HTN & BP has been controlled at home. Today's BP is at goal -  100/78. Patient has had no complaints of any cardiac type chest pain, palpitations, dyspnea / orthopnea / PND, dizziness, claudication, or dependent edema.       Hyperlipidemia is not controlled with diet & meds. Patient denies myalgias or other med SE's. Last Lipids were not at goal:  Lab Results  Component Value Date   CHOL 211 (H) 04/08/2020   HDL 51 04/08/2020   LDLCALC 116 (H) 04/08/2020   TRIG 288 (H) 04/08/2020   CHOLHDL 4.1 04/08/2020    Also, the patient has history of PreDiabetes and has had no symptoms of reactive hypoglycemia, diabetic polys, paresthesias or visual blurring.  Last A1c was Normal & at goal:  Lab Results  Component Value Date   HGBA1C 5.3 04/08/2020           Further, the patient also has history of Vitamin D Deficiency ("23" / 2008)  and supplements vitamin D without any suspected side-effects. Last vitamin D was at goal:  Lab Results  Component Value Date   VD25OH 87 01/06/2020    Current Outpatient Medications on File Prior to Visit  Medication Sig  . acetaminophen (TYLENOL) 500 MG tablet Take 1,000 mg by mouth 3 (three) times daily.   [name] albuterol (PROVENTIL HFA;VENTOLIN HFA) 108 (90 Base) MCG/ACT inhaler Inhale 1-2 puffs into the lungs every 6 (six) hours as needed for wheezing or shortness of breath.  . AMBULATORY NON FORMULARY MEDICATION Lift chair Dx: G20  . carbidopa-levodopa (SINEMET CR) 50-200 MG tablet Take 1 tablet by mouth at bedtime.  . carbidopa-levodopa (SINEMET IR) 25-100 MG  tablet 2 at 5am/1 at 7am/2 at 9am/1 at 11am/ 2 at 1pm/ 1 at 3pm/1 at 5pm  . Cholecalciferol (VITAMIN D3) 5000 units CAPS Take 5,000 Units by mouth 2 (two) times daily.  [name] dicyclomine (BENTYL) 20 MG tablet TAKE 1/2 TO 1 TABLET THREE TIMES PER DAY BEFORE MEALS FOR ABDOMNIALCRAMPING, BLOATING, DIARRHEA, OR NAUSEA  . hydrocortisone cream 1 % Apply 1 application topically daily as needed for itching.  . methocarbamol (ROBAXIN) 500 MG tablet Take by mouth.  . midodrine (PROAMATINE) 10 MG tablet Take 1/2-1 tab thee times daily for low blood pressure.  . NON FORMULARY Arthritis cream  . nystatin (MYCOSTATIN/NYSTOP) powder Apply under breasts 2-3 times a day.  . ondansetron (ZOFRAN-ODT) 4 MG disintegrating tablet Take 4 mg by mouth every 8 (eight) hours as needed for nausea or vomiting.  [name] OVER THE COUNTER MEDICATION Takes Mirlax daily  . senna (SENOKOT) 8.6 MG tablet Take 1 tablet by mouth 2 (two) times daily.   . sertraline (ZOLOFT) 100 MG tablet Takes 1.5 tablet at night (Patient taking differently: Takes 1 tablet at night)  . vitamin B-12 (CYANOCOBALAMIN) 500 MCG tablet Take 500 mcg by mouth daily.   No current facility-administered medications on file prior to visit.    Allergies  Allergen Reactions  . Macrobid [Nitrofurantoin Macrocrystal] Swelling and Rash  . Baclofen Other (  See Comments)    Kept her awake, affected memory  . Cephalosporins Other (See Comments)    Cannot sleep  . Codeine Nausea Only    dizziness  . Darvocet [Propoxyphene N-Acetaminophen] Nausea And Vomiting  . Diclofenac Rash    The gel caused a rash  . Levaquin [Levofloxacin] Hives  . Penicillins Rash  . Selegiline Hcl Rash  . Tramadol Nausea And Vomiting  . Triamcinolone Itching    PMHx:   Past Medical History:  Diagnosis Date  . Anemia    as young girl and teenager  . Arthritis   . Chest pain, unspecified   . Constipation   . Costochondritis   . GERD (gastroesophageal reflux disease)   . Headache     ocular migraines  . History of hiatal hernia   . Hyperlipidemia 10-07-12  . Never smoked tobacco   . Parkinson's disease ([place]) 10-07-12  . Parkinson's disease ([place])   . Prediabetes   . 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 Spinal stenosis     Immunization History  Administered Date(s) Administered  . Influenza, High Dose Seasonal PF 09/08/2014, 10/04/2015, 09/02/2019  . Influenza-Unspecified 09/29/2016, 08/08/2017, 08/22/2018  . PFIZER SARS-COV-2 Vaccination 12/22/2019, 01/23/2020  . Pneumococcal Conjugate-13 11/10/2015  . Pneumococcal Polysaccharide-23 02/26/2017  . Pneumococcal-Unspecified 11/27/2006  . Td 09/08/2013  . Zoster 06/27/2006    Past Surgical History:  Procedure Laterality Date  . ABDOMINAL HYSTERECTOMY    . APPENDECTOMY    . BACK SURGERY  12/20/2015  . BACK SURGERY  09/27/2017  . BACK SURGERY  2020  . BUNIONECTOMY Bilateral 11-11-3  . CARDIAC CATHETERIZATION Bilateral 2013  . COLONOSCOPY    . HERNIA REPAIR    . LEFT HEART CATHETERIZATION WITH CORONARY ANGIOGRAM N/A 07/03/2012   Procedure: LEFT HEART CATHETERIZATION WITH CORONARY ANGIOGRAM;  Surgeon: Troy Sine, MD;  Location: [facility] CATH LAB;  Service: Cardiovascular;  Laterality: N/A;  . REPAIR OF CEREBROSPINAL FLUID LEAK N/A 12/24/2015   Procedure: LUMBAR WOUND EXPLORATION, REPAIR OF CEREBROSPINAL FLUID LEAK, PLACEMENT OF LUMBAR DRAIN;  Surgeon: Kevan Ny Ditty, MD;  Location: [facility];  Service: Neurosurgery;  Laterality: N/A;  . ROTATOR CUFF REPAIR Left 10-07-12    FHx:    Reviewed / unchanged  SHx:    Reviewed / unchanged   Systems Review:  Constitutional: Denies fever, chills, wt changes, headaches, insomnia, fatigue, night sweats, change in appetite. Eyes: Denies redness, blurred vision, diplopia, discharge, itchy, watery eyes.  ENT: Denies discharge, congestion, post nasal drip, epistaxis, sore throat, earache, hearing loss, dental pain, tinnitus, vertigo, sinus pain, snoring.  CV:  Denies chest pain, palpitations, irregular heartbeat, syncope, dyspnea, diaphoresis, orthopnea, PND, claudication or edema. Respiratory: denies cough, dyspnea, DOE, pleurisy, hoarseness, laryngitis, wheezing.  Gastrointestinal: Denies dysphagia, odynophagia, heartburn, reflux, water brash, abdominal pain or cramps, nausea, vomiting, bloating, diarrhea, constipation, hematemesis, melena, hematochezia  or hemorrhoids. Genitourinary: Denies dysuria, frequency, urgency, nocturia, hesitancy, discharge, hematuria or flank pain. Musculoskeletal: Denies arthralgias, myalgias, stiffness, jt. swelling, pain, limping or strain/sprain.  Skin: Denies pruritus, rash, hives, warts, acne, eczema or change in skin lesion(s). Neuro: No weakness, tremor, incoordination, spasms, paresthesia or pain. Psychiatric: Denies confusion, memory loss or sensory loss. Endo: Denies change in weight, skin or hair change.  Heme/Lymph: No excessive bleeding, bruising or enlarged lymph nodes.  Physical Exam  BP 100/78   Pulse 88   Temp (!) 97 F (36.1 C)   Resp 16   Appears  well nourished, well groomed  and in no  distress.  Eyes: PERRLA, EOMs, conjunctiva no swelling or erythema. Sinuses: No frontal/maxillary tenderness ENT/Mouth: EAC's clear, TM's nl w/o erythema, bulging. Nares clear w/o erythema, swelling, exudates. Oropharynx clear without erythema or exudates. Oral hygiene is good. Tongue normal, non obstructing. Hearing intact.  Neck: Supple. Thyroid not palpable. Car 2+/2+ without bruits, nodes or JVD. Chest: Respirations nl with BS clear & equal w/o rales, rhonchi, wheezing or stridor.  Cor: Heart sounds normal w/ regular rate and rhythm without sig. murmurs, gallops, clicks or rubs. Peripheral pulses normal and equal  without edema.  Abdomen: Soft & bowel sounds normal. Non-tender w/o guarding, rebound, hernias, masses or organomegaly.  Lymphatics: Unremarkable.  Musculoskeletal:  In wheelchair. Muscle tone with  sl cog wheeling. .  Skin: Warm, dry without exposed rashes, lesions or ecchymosis apparent.  Neuro: Cranial nerves intact, reflexes equal bilaterally. Sensory-motor testing grossly intact. Tendon reflexes grossly intact.  Pysch: Alert & oriented x 3.  Insight and judgement nl & appropriate. No ideations.  Assessment and Plan:  - Continue medication, monitor blood pressure at home.  - Continue DASH diet.  Reminder to go to the ER if any CP,  SOB, nausea, dizziness, severe HA, changes vision/speech.  1. Labile hypertension  - CBC with Differential/Platelet - COMPLETE METABOLIC PANEL WITH GFR - Magnesium - TSH  2. Hyperlipidemia, mixed  - Continue diet/meds, exercise,& lifestyle modifications.  - Continue monitor periodic cholesterol/liver & renal functions   - TSH  3. Abnormal glucose  - Continue diet, exercise  - Lifestyle modifications.  - Monitor appropriate labs.  - Hemoglobin A1c - Insulin, random  4. Vitamin D deficiency  - Continue supplementation.  - VITAMIN D 25 Hydroxy   5. Chronic low back pain  - pregabalin (LYRICA) 50 MG capsule;  Take 1 to 2 capsules     3 x /day     for Chronic back pain   Dispense: 180 capsule; Refill: 1  - ROV 1 month to reassess  6. Irritable bowel syndrome with constipation   7. Parkinson disease ([place])  - CBC with Differential/Platelet - COMPLETE METABOLIC PANEL WITH GFR - Magnesium - TSH - Hemoglobin A1c - Insulin, random - VITAMIN D 25 Hydroxy   8. Acute cystitis without hematuria  - Urinalysis, Routine w reflex microscopic - Urine Culture  9. Medication management  - CBC with Differential/Platelet - COMPLETE METABOLIC PANEL WITH GFR - Magnesium - TSH - Hemoglobin A1c - Insulin, random - VITAMIN D 25 Hydroxy   ====================================================================== ++++++++++++++++++++++++++++++++++++++++++++++++++++++++++++++++++++++ ====================================================================== - Recommend   Light Weight Wheelchair:   Patient suffers from Parkinson's Disease, History of Multiple Falls,  Multiple Spinal Surgeries, Unstable Gait, High Fall Risk & Severe Physical Deconditioning which impairs her  ability to perform daily activities like toileting, feeding, dressing, grooming, and bathing in the home.   A cane, walker, crutch will not resolve issue with performing activities of daily living.   A wheelchair will allow patient to safely perform daily activities.   Patient is not able to propel herself in the home using a standard weight wheelchair due to  arm weakness, general weakness & poor endurance.   Patient can self propel in the light weight wheelchair    [place]  for the wheelchair.   ======================================================================= +++++++++++++++++++++++++++++++++++++++++++++++++++++++++++++++++++++++ =======================================================================       Discussed  regular exercise, BP monitoring, weight control to maintain BMI less than 25 and discussed med and SE's. Recommended labs to assess and monitor clinical status with further disposition pending results of labs.  I discussed the assessment and treatment plan with the patient. The patient was provided an opportunity to ask questions and all were answered. The patient agreed with the plan and demonstrated an understanding of the instructions.  I provided over 30 minutes of exam, counseling, chart review and  complex critical decision making.         The patient was advised to call back or seek an in-person evaluation if the symptoms worsen or if the condition fails to improve as anticipated.   Kirtland Bouchard, MD

## 2020-07-19 NOTE — Patient Instructions (Signed)

## 2020-07-20 ENCOUNTER — Other Ambulatory Visit: Payer: Self-pay

## 2020-07-20 ENCOUNTER — Ambulatory Visit (INDEPENDENT_AMBULATORY_CARE_PROVIDER_SITE_OTHER): Payer: Medicare Other | Admitting: Internal Medicine

## 2020-07-20 VITALS — BP 100/78 | HR 88 | Temp 97.0°F | Resp 16

## 2020-07-20 DIAGNOSIS — Z79899 Other long term (current) drug therapy: Secondary | ICD-10-CM

## 2020-07-20 DIAGNOSIS — M545 Low back pain: Secondary | ICD-10-CM | POA: Diagnosis not present

## 2020-07-20 DIAGNOSIS — G20 Parkinson's disease: Secondary | ICD-10-CM

## 2020-07-20 DIAGNOSIS — R0989 Other specified symptoms and signs involving the circulatory and respiratory systems: Secondary | ICD-10-CM

## 2020-07-20 DIAGNOSIS — E782 Mixed hyperlipidemia: Secondary | ICD-10-CM

## 2020-07-20 DIAGNOSIS — N3000 Acute cystitis without hematuria: Secondary | ICD-10-CM | POA: Diagnosis not present

## 2020-07-20 DIAGNOSIS — K581 Irritable bowel syndrome with constipation: Secondary | ICD-10-CM | POA: Diagnosis not present

## 2020-07-20 DIAGNOSIS — G20A1 Parkinson's disease without dyskinesia, without mention of fluctuations: Secondary | ICD-10-CM

## 2020-07-20 DIAGNOSIS — R7309 Other abnormal glucose: Secondary | ICD-10-CM

## 2020-07-20 DIAGNOSIS — Z9181 History of falling: Secondary | ICD-10-CM

## 2020-07-20 DIAGNOSIS — R29898 Other symptoms and signs involving the musculoskeletal system: Secondary | ICD-10-CM | POA: Diagnosis not present

## 2020-07-20 DIAGNOSIS — R2681 Unsteadiness on feet: Secondary | ICD-10-CM

## 2020-07-20 DIAGNOSIS — G8929 Other chronic pain: Secondary | ICD-10-CM

## 2020-07-20 DIAGNOSIS — E559 Vitamin D deficiency, unspecified: Secondary | ICD-10-CM

## 2020-07-20 MED ORDER — PREGABALIN 50 MG PO CAPS: ORAL_CAPSULE | ORAL | 1 refills | Status: DC

## 2020-07-21 ENCOUNTER — Other Ambulatory Visit: Payer: Self-pay | Admitting: Internal Medicine

## 2020-07-21 DIAGNOSIS — N3000 Acute cystitis without hematuria: Secondary | ICD-10-CM

## 2020-07-21 LAB — HEMOGLOBIN A1C
Hgb A1c MFr Bld: 5.2 % of total Hgb (ref <5.7)
Mean Plasma Glucose: 103 (calc)
eAG (mmol/L): 5.7 (calc)

## 2020-07-21 LAB — COMPLETE METABOLIC PANEL WITH GFR
AG Ratio: 2.2 (calc) (ref 1.0–2.5)
ALT: 10 U/L (ref 6–29)
AST: 18 U/L (ref 10–35)
Albumin: 4.1 g/dL (ref 3.6–5.1)
Alkaline phosphatase (APISO): 132 U/L (ref 37–153)
BUN/Creatinine Ratio: 35 (calc) — ABNORMAL HIGH (ref 6–22)
BUN: 31 mg/dL — ABNORMAL HIGH (ref 7–25)
CO2: 26 mmol/L (ref 20–32)
Calcium: 9.5 mg/dL (ref 8.6–10.4)
Chloride: 107 mmol/L (ref 98–110)
Creat: 0.88 mg/dL (ref 0.60–0.93)
GFR, Est African American: 73 mL/min/{1.73_m2} (ref >=60)
GFR, Est Non African American: 63 mL/min/{1.73_m2} (ref >=60)
Globulin: 1.9 g/dL (calc) (ref 1.9–3.7)
Glucose, Bld: 115 mg/dL — ABNORMAL HIGH (ref 65–99)
Potassium: 4.3 mmol/L (ref 3.5–5.3)
Sodium: 140 mmol/L (ref 135–146)
Total Bilirubin: 0.3 mg/dL (ref 0.2–1.2)
Total Protein: 6 g/dL — ABNORMAL LOW (ref 6.1–8.1)

## 2020-07-21 LAB — CBC WITH DIFFERENTIAL/PLATELET
Absolute Monocytes: 400 cells/uL (ref 200–950)
Basophils Absolute: 18 cells/uL (ref 0–200)
Basophils Relative: 0.4 %
Eosinophils Absolute: 120 cells/uL (ref 15–500)
Eosinophils Relative: 2.6 %
HCT: 35.5 % (ref 35.0–45.0)
Hemoglobin: 11.6 g/dL — ABNORMAL LOW (ref 11.7–15.5)
Lymphs Abs: 920 cells/uL (ref 850–3900)
MCH: 31.3 pg (ref 27.0–33.0)
MCHC: 32.7 g/dL (ref 32.0–36.0)
MCV: 95.7 fL (ref 80.0–100.0)
MPV: 10.9 fL (ref 7.5–12.5)
Monocytes Relative: 8.7 %
Neutro Abs: 3142 cells/uL (ref 1500–7800)
Neutrophils Relative %: 68.3 %
Platelets: 246 10*3/uL (ref 140–400)
RBC: 3.71 10*6/uL — ABNORMAL LOW (ref 3.80–5.10)
RDW: 12.4 % (ref 11.0–15.0)
Total Lymphocyte: 20 %
WBC: 4.6 10*3/uL (ref 3.8–10.8)

## 2020-07-21 LAB — INSULIN, RANDOM: Insulin: 22 u[IU]/mL — ABNORMAL HIGH

## 2020-07-21 LAB — VITAMIN D 25 HYDROXY (VIT D DEFICIENCY, FRACTURES): Vit D, 25-Hydroxy: 59 ng/mL (ref 30–100)

## 2020-07-21 LAB — MAGNESIUM: Magnesium: 2.2 mg/dL (ref 1.5–2.5)

## 2020-07-21 LAB — TSH: TSH: 0.67 mIU/L (ref 0.40–4.50)

## 2020-07-21 NOTE — Progress Notes (Signed)
=====================================================  -   U/A suspicious for UTI - culture  still pending  ====================================================  - Kidney Functions stable  ====================================================  - A1c - Normal - great  - No diabetes ====================================================  - Vitamin D - OK - Keep dose same  ====================================================  - All Else - CBC - Electrolytes - Liver - Magnesium & Thyroid    - all  Normal / OK ====================================================

## 2020-07-22 ENCOUNTER — Telehealth: Payer: Self-pay | Admitting: *Deleted

## 2020-07-22 ENCOUNTER — Other Ambulatory Visit: Payer: Self-pay | Admitting: Internal Medicine

## 2020-07-22 DIAGNOSIS — G8929 Other chronic pain: Secondary | ICD-10-CM

## 2020-07-22 DIAGNOSIS — Z79899 Other long term (current) drug therapy: Secondary | ICD-10-CM

## 2020-07-22 MED ORDER — PREGABALIN 50 MG PO CAPS: ORAL_CAPSULE | ORAL | 1 refills | Status: DC

## 2020-07-22 NOTE — Telephone Encounter (Signed)
Pt aware of lab results 

## 2020-07-23 ENCOUNTER — Other Ambulatory Visit: Payer: Self-pay | Admitting: Internal Medicine

## 2020-07-23 DIAGNOSIS — N390 Urinary tract infection, site not specified: Secondary | ICD-10-CM

## 2020-07-23 LAB — URINALYSIS, ROUTINE W REFLEX MICROSCOPIC
Bilirubin Urine: NEGATIVE
Glucose, UA: NEGATIVE
Hgb urine dipstick: NEGATIVE
Leukocytes,Ua: NEGATIVE
Nitrite: POSITIVE — AB
Protein, ur: NEGATIVE
Specific Gravity, Urine: 1.031 (ref 1.001–1.03)
Squamous Epithelial / HPF: NONE SEEN /HPF (ref <=5)
pH: 5.5 (ref 5.0–8.0)

## 2020-07-23 LAB — URINE CULTURE
MICRO NUMBER:: 10866107
SPECIMEN QUALITY:: ADEQUATE

## 2020-07-23 MED ORDER — SULFAMETHOXAZOLE-TRIMETHOPRIM 400-80 MG PO TABS: ORAL_TABLET | ORAL | 0 refills | Status: DC

## 2020-07-23 NOTE — Progress Notes (Signed)
==========================================================  -    Urine culture finally returned & shows an infection  - You are allergic to ALL of the antibiotic choices except one which   is a sulfa drug  - Rx is sent to your [facility]   - Take it 2 x /day on a full stomach to avoid Nausea and important to drik lots of water / fluids.   - Also taking Vitamin C acidifies urine and helps prevent infection   -Please call office to schedule a Nurse  / Lab visit in about a month to recheck urine to be sure infection is cleared up.  ==========================================================

## 2020-07-26 ENCOUNTER — Telehealth: Payer: Self-pay | Admitting: Internal Medicine

## 2020-07-26 NOTE — Telephone Encounter (Signed)
lab results given, advised rx sent to pharmacy

## 2020-07-28 ENCOUNTER — Telehealth: Payer: Self-pay

## 2020-07-28 NOTE — Telephone Encounter (Signed)
Spoke with patient who states Dr [name] gave a rx for a lift chair. Patient states she went to [place] and purchased a chair. She wants to know if Dr [name] can refund her the money she paid for the chair since it was so expensive.   Advised patient that she should contact her insurance. Advised her that Dr [name] does not reimburse patients for any kind of expenses. Also advised patient that she should have contacted a medical supply store first to see what her insurance would cover before making such a large purchase.   Patient stated she was going to contact her insurance and help she can receive some kind of refund for the chair.

## 2020-07-29 ENCOUNTER — Telehealth: Payer: Self-pay | Admitting: *Deleted

## 2020-07-29 NOTE — Telephone Encounter (Signed)
Patient called and asked for advise regarding a rheumatologist referral for her pain in her hands. Per Dr Melford Aase, she was advised medications for arthritis can be harmful to her kidneys and can cause GI bleeding. He advised the patient to continue Lyrica 50 mg 3 times a day and she can continue Tylenol 1000 mg 3 time a day.

## 2020-08-29 NOTE — Progress Notes (Signed)
History of Present Illness:      This very nice 79 y.o.  MWF  presents for 1 month follow up with HTN and neurogenic Orthostatic Hypotension, HLD, Pre-Diabetes, Parkinson's Disease and Vitamin D Deficiency.       Patient has chronic LBP consequent of DDD / OA with chronic LBP and lumbar radiculopathy s/p 3 back surgeries 2017,2018 and last in June 2020  Underwent a T10-pelvis  PSF . Patient is followed by Dr [name] for her Parkinson's Disease and patient has unstable gait and is high risk for falls and has been recommended not to ambulate and use her wheelchair 100% of the time. At last visit, patient  was prescribed Lyrica for her chronic Back Pain.  Patient reports much improved pain on the Lyrica, but is c/o more of joint pains in her fingers. After last OV, she was also treated for an E.coli UTI.   Medications   Current Outpatient Medications (Cardiovascular):  .  midodrine (PROAMATINE) 10 MG tablet, Take 1/2-1 tab thee times daily for low blood pressure.  Current Outpatient Medications (Respiratory):  .  albuterol (PROVENTIL HFA;VENTOLIN HFA) 108 (90 Base) MCG/ACT inhaler, Inhale 1-2 puffs into the lungs every 6 (six) hours as needed for wheezing or shortness of breath.  Current Outpatient Medications (Analgesics):  .  acetaminophen (TYLENOL) 500 MG tablet, Take 1,000 mg by mouth 3 (three) times daily.   Current Outpatient Medications (Hematological):  .  vitamin B-12 (CYANOCOBALAMIN) 500 MCG tablet, Take 500 mcg by mouth daily.  Current Outpatient Medications (Other):  [name]  AMBULATORY NON FORMULARY MEDICATION, Lift chair Dx: G20 .  carbidopa-levodopa (SINEMET CR) 50-200 MG tablet, Take 1 tablet by mouth at bedtime. .  carbidopa-levodopa (SINEMET IR) 25-100 MG tablet, 2 at 5am/1 at 7am/2 at 9am/1 at 11am/ 2 at 1pm/ 1 at 3pm/1 at 5pm .  Cholecalciferol (VITAMIN D3) 5000 units CAPS, Take 5,000 Units by mouth 2 (two) times daily. .  hydrocortisone cream 1 %, Apply 1 application topically  daily as needed for itching. .  methocarbamol (ROBAXIN) 500 MG tablet, Take by mouth. .  NON FORMULARY, Arthritis cream .  nystatin (MYCOSTATIN/NYSTOP) powder, Apply under breasts 2-3 times a day. .  ondansetron (ZOFRAN-ODT) 4 MG disintegrating tablet, Take 4 mg by mouth every 8 (eight) hours as needed for nausea or vomiting. [name]  OVER THE COUNTER MEDICATION, Takes Mirlax daily .  pregabalin (LYRICA) 50 MG capsule, Take 1 to 2 capsules     3 x /day     for Chronic back pain .  senna (SENOKOT) 8.6 MG tablet, Take 1 tablet by mouth 2 (two) times daily.  .  sertraline (ZOLOFT) 100 MG tablet, Takes 1.5 tablet at night (Patient taking differently: Takes 1 tablet at night)  Problem list She has Parkinson disease ([place]); RBD (REM behavioral disorder); Hyperlipidemia; Hypertension; GERD (gastroesophageal reflux disease); Abnormal glucose; Vitamin D deficiency; Medication management; SI (sacroiliac) pain; Chronic pain syndrome; Lumbar radiculopathy; Lumbar stenosis with neurogenic claudication; Positive colorectal cancer screening using Cologuard test; Frequent falls; S/P spinal fusion; Postlaminectomy syndrome, not elsewhere classified; and Osteoporosis on their problem list.   Observations/Objective:   BP 108/84   Pulse 84   Temp (!) 97.5 F (36.4 C)   Resp 16   SpO2 99%   HEENT - WNL. Neck - supple.  Chest - Clear equal BS. Cor - Nl HS. RRR w/o sig MGR. PP 1(+). No edema. MS- Nl muscle tone . No cog wheeling.  Lateral splaying of  the fingers at the MCP jts with Heberden's / Bouchard's  nodes.   Non ambulatory, in wheelchair Neuro -  Nl w/o focal abnormalities.  Assessment and Plan:   1. Labile hypertension  - hydrochlorothiazide (HYDRODIURIL) 25 MG tablet; Take      1 tablet     Daily      For Fluid Retention / Ankle Swelling  Dispense: 90 tablet; Refill: 1  2. Parkinson disease ([place])   3. Unstable gait   4. At high risk for falls   5. Chronic pain syndrome   6. Urinary tract  infectio  - Urinalysis, Routine w reflex microscopic - Urine Culture  7. Arthralgia of both hands  - Cyclic citrul peptide antibody, IgG - C-reactive protein - Sedimentation rate  8. Edema, unspecified type  - hydrochlorothiazide (HYDRODIURIL) 25 MG tablet; Take      1 tablet     Daily      For Fluid Retention / Ankle Swelling  Dispense: 90 tablet; Refill: 1  9. Need for immunization against influenza  - Flu vaccine HIGH DOSE PF (Fluzone High dose)   Follow Up Instructions:       I discussed the assessment and treatment plan with the patient. The patient was provided an opportunity to ask questions and all were answered. The patient agreed with the plan and demonstrated an understanding of the instructions.       The patient was advised to call back or seek an in-person evaluation if the symptoms worsen or if the condition fails to improve as anticipated.    Kirtland Bouchard, MD

## 2020-08-30 ENCOUNTER — Ambulatory Visit (INDEPENDENT_AMBULATORY_CARE_PROVIDER_SITE_OTHER): Payer: Medicare Other | Admitting: Internal Medicine

## 2020-08-30 ENCOUNTER — Encounter: Payer: Self-pay | Admitting: Internal Medicine

## 2020-08-30 ENCOUNTER — Other Ambulatory Visit: Payer: Self-pay

## 2020-08-30 VITALS — BP 108/84 | HR 84 | Temp 97.5°F | Resp 16

## 2020-08-30 DIAGNOSIS — G894 Chronic pain syndrome: Secondary | ICD-10-CM | POA: Diagnosis not present

## 2020-08-30 DIAGNOSIS — Z23 Encounter for immunization: Secondary | ICD-10-CM

## 2020-08-30 DIAGNOSIS — R0989 Other specified symptoms and signs involving the circulatory and respiratory systems: Secondary | ICD-10-CM

## 2020-08-30 DIAGNOSIS — G20 Parkinson's disease: Secondary | ICD-10-CM | POA: Diagnosis not present

## 2020-08-30 DIAGNOSIS — M25541 Pain in joints of right hand: Secondary | ICD-10-CM | POA: Diagnosis not present

## 2020-08-30 DIAGNOSIS — R609 Edema, unspecified: Secondary | ICD-10-CM

## 2020-08-30 DIAGNOSIS — R2681 Unsteadiness on feet: Secondary | ICD-10-CM | POA: Diagnosis not present

## 2020-08-30 DIAGNOSIS — Z79899 Other long term (current) drug therapy: Secondary | ICD-10-CM | POA: Diagnosis not present

## 2020-08-30 DIAGNOSIS — N390 Urinary tract infection, site not specified: Secondary | ICD-10-CM | POA: Diagnosis not present

## 2020-08-30 DIAGNOSIS — M25542 Pain in joints of left hand: Secondary | ICD-10-CM | POA: Diagnosis not present

## 2020-08-30 DIAGNOSIS — Z9181 History of falling: Secondary | ICD-10-CM | POA: Diagnosis not present

## 2020-08-30 MED ORDER — HYDROCHLOROTHIAZIDE 25 MG PO TABS: ORAL_TABLET | ORAL | 1 refills | Status: DC

## 2020-08-31 LAB — URINALYSIS, ROUTINE W REFLEX MICROSCOPIC
Bacteria, UA: NONE SEEN /HPF
Bilirubin Urine: NEGATIVE
Glucose, UA: NEGATIVE
Hgb urine dipstick: NEGATIVE
Hyaline Cast: NONE SEEN /LPF
Leukocytes,Ua: NEGATIVE
Nitrite: POSITIVE — AB
Protein, ur: NEGATIVE
Specific Gravity, Urine: 1.031 (ref 1.001–1.03)
Squamous Epithelial / HPF: NONE SEEN /HPF (ref <=5)
WBC, UA: NONE SEEN /HPF (ref 0–5)
pH: <=5 (ref 5.0–8.0)

## 2020-08-31 LAB — URINE CULTURE
MICRO NUMBER:: 11027850
SPECIMEN QUALITY:: ADEQUATE

## 2020-08-31 LAB — C-REACTIVE PROTEIN: CRP: 1.2 mg/L (ref <8.0)

## 2020-08-31 LAB — SEDIMENTATION RATE: Sed Rate: 9 mm/h (ref 0–30)

## 2020-08-31 LAB — CYCLIC CITRUL PEPTIDE ANTIBODY, IGG: Cyclic Citrullin Peptide Ab: <16 UNITS

## 2020-08-31 NOTE — Progress Notes (Signed)
========================================================== -   Test results slightly outside the reference range are not unusual. If there is anything important, I will review this with you,  otherwise it is considered normal test values.  If you have further questions,  please do not hesitate to contact me at the office or via My Chart.  ==========================================================  -  Fortunately blood tests for Rheumatoid Arthritis and inflammatory   types of Arthritis all returned Negative - Great  ! ==========================================================  -  Also , Urine culture returned OK - No more infection ==========================================================

## 2020-09-27 ENCOUNTER — Telehealth: Payer: Self-pay | Admitting: Neurology

## 2020-09-27 DIAGNOSIS — Z23 Encounter for immunization: Secondary | ICD-10-CM | POA: Diagnosis not present

## 2020-09-27 NOTE — Telephone Encounter (Signed)
Spoke patient and informed her that Dr [name] is out of the office and for her to contact her pcp. Advised her that I would send Dr [name] a message and when she returns on Wednesday I will give her a call back. She voiced understanding.

## 2020-09-27 NOTE — Telephone Encounter (Signed)
Per Dr. Doristine Devoid note: Multiple discussions with the patient over the last several years about the fact that she should not be walking and should stay seated at all times due to multiple falls.  However, if this is different than her symptoms with her visit with Dr. Carles Collet and she cannot move her feet, pls have her go to ER. Thanks

## 2020-09-27 NOTE — Telephone Encounter (Signed)
Patient reports that her legs and feet are having a hard time moving and walking. She was wondering if there was any medication she could take to help. She had the covid booster shot around noon today and doesn't know if that's affecting it, but states she can hardly move her feet at all today. Patient uses [facility]. Please call.

## 2020-09-28 NOTE — Telephone Encounter (Signed)
Left message for patient to contact office.

## 2020-09-28 NOTE — Telephone Encounter (Signed)
Agree with Dr. Delice Lesch re: staying seated

## 2020-09-28 NOTE — Telephone Encounter (Signed)
As per many previous phone calls and visit notes, I will not restart that medication because of confusion/hallucinations.  We have offered her other opinions but she has declined thus far

## 2020-09-28 NOTE — Telephone Encounter (Signed)
Gave patient Dr Doristine Devoid recommendations. She states the medication that Dr [name] suggested in the past was very expensive and she could not afford it. Advised patient that I would speak with Dr [name] and if she had any other suggestions or recommendations I would give her call back. She voiced understanding.

## 2020-09-28 NOTE — Telephone Encounter (Signed)
Patient states she will think about going to ER. She states her feet or legs do not want to move. She states she has to be able to walk a little. Patient states when she saw Dr Erling Cruz in the past he wrote a rx for Mirapex and that helped her walk. She states Dr [name] stopped the Mirapex because it caused hallucinations, but her walking is bad since stopping the medication.    Advised patient that I would get this message to Dr [name] and if she had any suggestions that I would give her a call back. She voiced understanding.

## 2020-11-05 ENCOUNTER — Encounter: Payer: Self-pay | Admitting: Neurology

## 2020-11-05 NOTE — Progress Notes (Signed)
Alexandra Lindsey (Key: [id number]) Nuplazid 34MG  capsules   Form [employer] Medicare Part D Electronic Prior Authorization Form (2017 NCPDP) Created 16 hours ago Sent to Plan 16 hours ago Plan Response 16 hours ago Submit Clinical Questions 16 hours ago Determination Favorable 15 hours ago Per the Manufacturer, NUPLAZID (pimavanserin) is available through 6 pharmacies: [place]  These pharmacies can offer guidance on financial and prior authorization assistance. If you need additional assistance please call [phone]. Please read the Full Prescribing Information, including Boxed WARNING.  Message from plan: Request Reference Number: [id number]. NUPLAZID CAP 34MG  is approved through 11/26/2021. Your patient may now fill this prescription and it will be covered.

## 2020-11-10 ENCOUNTER — Other Ambulatory Visit: Payer: Self-pay | Admitting: Internal Medicine

## 2020-11-10 MED ORDER — SULFAMETHOXAZOLE-TRIMETHOPRIM 400-80 MG PO TABS: ORAL_TABLET | ORAL | 0 refills | Status: DC

## 2021-01-07 NOTE — Progress Notes (Signed)
Assessment/Plan:   1.  Parkinsons Disease  -discussed again to take carbidopa/levodopa 25/100 on the following schedule: , 2 at 5am/1 at 7am/2 at 9am/1 at 11am/ 2 at 1pm/ 1 at 3pm/1 at [place] pt/husband to watch for increased hallucinations  -compliance is an issue here  -discussed recommendations, as previous, I that the patient is not to be walking at all and to be seated 100% of the time, unless someone is transferring with her or directly holding onto her.  -talked about power WC since don't want her walking.  They declined due to home size.  2.  PDD with Parkinson's hallucinations  -took self off of nuplazid, 34 daily.  Told her to watch hallucinations as we increase the levodopa.  She may need to reconsider the addition of nuplazid.  3.  Neurogenic Orthostatic Hypotension  -On midodrine, 10 mg 3 times per day but pt states that monitoring and only taking prn.  BP okay today.  Increase hydration Subjective:   Alexandra Lindsey was seen today in follow up for Parkinsons disease.  My previous records were reviewed prior to todays visit as well as outside records available to me.   This patient is accompanied in the office by her husband who supplements the history.  Patient called since last visit asking again if she could go back on the pramipexole.  We have had multiple discussions about this and the fact that it causes hallucinations and she already has hallucinations.  We declined this.   She stated that Nuplazid was expensive, but we gave her phone numbers to patient assistance programs. States hallucinations are better but "I still see some of the people but they are better than what they were." She had previously thought that it made her walking worse.  We have, however, asked her not to walk because of multiple falls.  Patient is supposed to be on midodrine, 10 mg 3 times per day.  She is not consistent about taking that.  She does state that she takes it depending on what her blood  pressure is.   I did increase her levodopa last visit but she returns today on a lower number of pills than prescribed.  She admits that she didn't try that for more than a day if she did this.   She c/o inability to walk (not supposed to be).   Husband has caregivers in the home 3 days/week for 4 hours each time.  Patient is not left home alone.  Primary care records from October 4 are reviewed.  Current prescribed movement disorder medications: Carbidopa/levodopa 25/100,  2 at 5am/1 at 7am/2 at 9am/1 at 11am/ 2 at 1pm/ 1 at 3pm/1 at 5pm (and increased last visit but she didn't do that and she is taking 2 tablets at 5 AM/2 tablets at 9 AM/1 tablet at 1PM/2 tablets at 5 PM/1 regular at bed Carbidopa/levodopa 50/200 at bedtime (pt reports not taking) Midodrine 10 mg 3 times per day (states that only taking prn based on BP and states that she doesn't need to do it often - lightheaded spell few days/week) Nuplazid, 34 mg daily (pt not taking)  PREVIOUS MEDICATIONS: Pramipexole (stopped because of hallucinations); stalevo; klonopin; rytary; nuplazid (? If took it)  ALLERGIES:   Allergies  Allergen Reactions  . Lyrica [Pregabalin] Rash and Other (See Comments)    Weight gain and swelling in hands, legs and feet  . Macrobid [Nitrofurantoin Macrocrystal] Swelling and Rash  . Baclofen Other (See  Comments)    Kept her awake, affected memory  . Cephalosporins Other (See Comments)    Cannot sleep  . Codeine Nausea Only    dizziness  . Darvocet [Propoxyphene N-Acetaminophen] Nausea And Vomiting  . Diclofenac Rash    The gel caused a rash  . Levaquin [Levofloxacin] Hives  . Penicillins Rash  . Selegiline Hcl Rash  . Tramadol Nausea And Vomiting  . Triamcinolone Itching    CURRENT MEDICATIONS:  Outpatient Encounter Medications as of 01/11/2021  Medication Sig  . acetaminophen (TYLENOL) 500 MG tablet Take 1,000 mg by mouth 3 (three) times daily.  [name] albuterol (PROVENTIL HFA;VENTOLIN HFA) 108 (90  Base) MCG/ACT inhaler Inhale 1-2 puffs into the lungs every 6 (six) hours as needed for wheezing or shortness of breath.  . AMBULATORY NON FORMULARY MEDICATION Lift chair Dx: G20  . carbidopa-levodopa (SINEMET IR) 25-100 MG tablet 2 at 5am/1 at 7am/2 at 9am/1 at 11am/ 2 at 1pm/ 1 at 3pm/1 at 5pm (Patient taking differently: 2 at 5am/2 at 9am/1 at 11am/2 at 5pm/ 1 at bedtime)  . Cholecalciferol (VITAMIN D3) 5000 units CAPS Take 5,000 Units by mouth daily.  . Cranberry-Vitamin C-Probiotic (AZO CRANBERRY) 250-30 MG TABS Take 1 tablet by mouth daily.  . hydrochlorothiazide (HYDRODIURIL) 25 MG tablet Take      1 tablet     Daily      For Fluid Retention / Ankle Swelling  . hydrocortisone cream 1 % Apply 1 application topically daily as needed for itching.  . methocarbamol (ROBAXIN) 500 MG tablet Take by mouth.  . midodrine (PROAMATINE) 10 MG tablet Take 1/2-1 tab thee times daily for low blood pressure.  . NON FORMULARY Arthritis cream  . nystatin (MYCOSTATIN/NYSTOP) powder Apply under breasts 2-3 times a day.  . ondansetron (ZOFRAN-ODT) 4 MG disintegrating tablet Take 4 mg by mouth every 8 (eight) hours as needed for nausea or vomiting.  [name] OVER THE COUNTER MEDICATION Takes Mirlax daily  . Probiotic Product (ALIGN) 4 MG CAPS Take 1 capsule by mouth daily.  . sertraline (ZOLOFT) 100 MG tablet Takes 1.5 tablet at night (Patient taking differently: Takes 1 tablet at night)  . sulfamethoxazole-trimethoprim (BACTRIM) 400-80 MG tablet Take      1 tablet       2 x /day        with Food for UTI  . vitamin B-12 (CYANOCOBALAMIN) 500 MCG tablet Take 500 mcg by mouth daily.  . carbidopa-levodopa (SINEMET CR) 50-200 MG tablet Take 1 tablet by mouth at bedtime. (Patient not taking: Reported on 01/11/2021)  . [DISCONTINUED] pregabalin (LYRICA) 50 MG capsule Take 1 to 2 capsules     3 x /day     for Chronic back pain (Patient not taking: Reported on 01/11/2021)  . [DISCONTINUED] senna (SENOKOT) 8.6 MG tablet Take 1  tablet by mouth 2 (two) times daily.  (Patient not taking: Reported on 01/11/2021)   No facility-administered encounter medications on file as of 01/11/2021.    Objective:   PHYSICAL EXAMINATION:    VITALS:   Vitals:   01/11/21 1040  BP: 122/66  Pulse: 70  SpO2: 96%  Weight: 149 lb (67.6 kg)  Height: 4' 11.5" (1.511 m)    GEN:  The patient appears stated age and is in NAD. HEENT:  Normocephalic, atraumatic.  The mucous membranes are moist. The superficial temporal arteries are without ropiness or tenderness. CV:  RRR Lungs:  CTAB Neck/HEME:  There are no carotid bruits bilaterally.  Neurological examination:  Orientation: The patient is alert and oriented x3. Cranial nerves: There is good facial symmetry with facial hypomimia. The speech is fluent and clear.  She is hypophonic.  Soft palate rises symmetrically and there is no tongue deviation. Hearing is intact to conversational tone. Sensation: Sensation is intact to light touch throughout Motor: Strength is at least antigravity x4.  Movement examination: Tone: There is normal tone in the UE/LE Abnormal movements: She has some mild dyskinesia in the legs. Coordination:  There is  decremation with RAM's, especially in the left upper extremity, but testing in the left hand is limited because of orthopedic issues. Gait and Station: Not tested today.  I have reviewed and interpreted the following labs independently    Chemistry      Component Value Date/Time   NA 140 07/20/2020 1048   K 4.3 07/20/2020 1048   CL 107 07/20/2020 1048   CO2 26 07/20/2020 1048   BUN 31 (H) 07/20/2020 1048   CREATININE 0.88 07/20/2020 1048      Component Value Date/Time   CALCIUM 9.5 07/20/2020 1048   ALKPHOS 94 11/17/2016 1154   AST 18 07/20/2020 1048   ALT 10 07/20/2020 1048   BILITOT 0.3 07/20/2020 1048       Lab Results  Component Value Date   WBC 4.6 07/20/2020   HGB 11.6 (L) 07/20/2020   HCT 35.5 07/20/2020   MCV 95.7  07/20/2020   PLT 246 07/20/2020    Lab Results  Component Value Date   TSH 0.67 07/20/2020     Total time spent on today's visit was 35 minutes, including both face-to-face time and nonface-to-face time.  Time included that spent on review of records (prior notes available to me/labs/imaging if pertinent), discussing treatment and goals, answering patient's questions and coordinating care.  Cc:  Unk Pinto, MD

## 2021-01-11 ENCOUNTER — Encounter: Payer: Self-pay | Admitting: Neurology

## 2021-01-11 ENCOUNTER — Other Ambulatory Visit: Payer: Self-pay

## 2021-01-11 ENCOUNTER — Ambulatory Visit (INDEPENDENT_AMBULATORY_CARE_PROVIDER_SITE_OTHER): Payer: Medicare Other | Admitting: Neurology

## 2021-01-11 VITALS — BP 122/66 | HR 70 | Ht 59.5 in | Wt 149.0 lb

## 2021-01-11 DIAGNOSIS — R441 Visual hallucinations: Secondary | ICD-10-CM | POA: Diagnosis not present

## 2021-01-11 DIAGNOSIS — G20 Parkinson's disease: Secondary | ICD-10-CM

## 2021-01-11 DIAGNOSIS — G903 Multi-system degeneration of the autonomic nervous system: Secondary | ICD-10-CM

## 2021-01-11 NOTE — Patient Instructions (Signed)
Take carbidopa/levodopa 25/100, 2 at 5am/1 at 7am/2 at 9am/1 at 11am/ 2 at 1pm/ 1 at 3pm/1 at 5pm

## 2021-01-12 ENCOUNTER — Telehealth: Payer: Self-pay | Admitting: *Deleted

## 2021-01-12 NOTE — Telephone Encounter (Signed)
Returned call to patient in regard to having watery eyes and runny nose. Patient concerned due to having a spinal surgery that resulted in spinal fluid leakage, several years ago. Per Dr Melford Aase, try Zyrtec in the morning and Benadryl 25 mg 2 tablets at bedtime. Patient aware and has OV with Dr Melford Aase next week and will see if symptoms have improved.

## 2021-01-18 ENCOUNTER — Other Ambulatory Visit: Payer: Self-pay

## 2021-01-18 ENCOUNTER — Encounter: Payer: Self-pay | Admitting: Internal Medicine

## 2021-01-18 ENCOUNTER — Ambulatory Visit (INDEPENDENT_AMBULATORY_CARE_PROVIDER_SITE_OTHER): Payer: Medicare Other | Admitting: Internal Medicine

## 2021-01-18 VITALS — BP 106/76 | HR 94 | Temp 97.2°F | Resp 16

## 2021-01-18 DIAGNOSIS — E559 Vitamin D deficiency, unspecified: Secondary | ICD-10-CM | POA: Diagnosis not present

## 2021-01-18 DIAGNOSIS — E782 Mixed hyperlipidemia: Secondary | ICD-10-CM

## 2021-01-18 DIAGNOSIS — I950 Idiopathic hypotension: Secondary | ICD-10-CM

## 2021-01-18 DIAGNOSIS — L0100 Impetigo, unspecified: Secondary | ICD-10-CM | POA: Diagnosis not present

## 2021-01-18 DIAGNOSIS — R0989 Other specified symptoms and signs involving the circulatory and respiratory systems: Secondary | ICD-10-CM | POA: Diagnosis not present

## 2021-01-18 DIAGNOSIS — R7309 Other abnormal glucose: Secondary | ICD-10-CM

## 2021-01-18 DIAGNOSIS — Z79899 Other long term (current) drug therapy: Secondary | ICD-10-CM

## 2021-01-18 DIAGNOSIS — N3000 Acute cystitis without hematuria: Secondary | ICD-10-CM

## 2021-01-18 MED ORDER — DEXAMETHASONE 4 MG PO TABS: ORAL_TABLET | ORAL | 0 refills | Status: DC

## 2021-01-18 MED ORDER — DOXYCYCLINE HYCLATE 100 MG PO CAPS: ORAL_CAPSULE | ORAL | 0 refills | Status: DC

## 2021-01-18 NOTE — Progress Notes (Signed)
AortaScan < 3 cm. Within normal limits, per Dr McKeown. 

## 2021-01-18 NOTE — Progress Notes (Signed)
History of Present Illness:      This very nice 80 y.o.  MWF presents for 3 month follow up with with HTN and neurogenic Orthostatic Hypotension, HLD, Pre-Diabetes, Parkinson's Disease and Vitamin D Deficiency.  Patient was treated for a UTI at last OV.        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 Patient is followed closely by Dr [name] for her Parkinson's Dz.  Patient is very unstable in gait and has been advised not to attempt ambulation due to her hx/o multiple falls. Patient has had improvement in her LBP on Lyrica. Dr [name] attempts to manage patient's Parkinson's meds, but apparently patient is very poorly compliant.        Patient is treated for HTN  And dependent venus insufficiency type edema with a diuretic & has also had episodes of postural hypotension for which she's prescribed Midodrine. Today's BP is at goal -  106/76. Patient has had no complaints of any cardiac type chest pain, palpitations, dyspnea / orthopnea / PND, dizziness, claudication, or dependent edema.      Hyperlipidemia is not controlled with diet & meds. Patient denies myalgias or other med SE's. Last Lipids were not at goal  Lab Results  Component Value Date   CHOL 211 (H) 04/08/2020   HDL 51 04/08/2020   LDLCALC 116 (H) 04/08/2020   TRIG 288 (H) 04/08/2020   CHOLHDL 4.1 04/08/2020    Also, the patient has history of elevated glucoses and has had no symptoms of reactive hypoglycemia, diabetic polys, paresthesias or visual blurring.  Last A1c was Normal & at goal:  Lab Results  Component Value Date   HGBA1C 5.2 07/20/2020           Further, the patient also has history of Vitamin D Deficiency ("23" /2008)and supplements vitamin D without any suspected side-effects. Last vitamin D was  Lab Results  Component Value Date   VD25OH 59 07/20/2020    Current Outpatient Medications on File Prior to Visit  Medication Sig  . acetaminophen (TYLENOL) 500 MG tablet Take 1,000 mg by  mouth 3 (three) times daily.  [name] albuterol (PROVENTIL HFA;VENTOLIN HFA) 108 (90 Base) MCG/ACT inhaler Inhale 1-2 puffs into the lungs every 6 (six) hours as needed for wheezing or shortness of breath.  . AMBULATORY NON FORMULARY MEDICATION Lift chair Dx: G20  . carbidopa-levodopa (SINEMET CR) 50-200 MG tablet Take 1 tablet by mouth at bedtime.  . carbidopa-levodopa (SINEMET IR) 25-100 MG tablet 2 at 5am/1 at 7am/2 at 9am/1 at 11am/ 2 at 1pm/ 1 at 3pm/1 at 5pm (Patient taking differently: 2 at 5am/2 at 9am/1 at 11am/2 at 5pm/ 1 at bedtime)  . Cholecalciferol (VITAMIN D3) 5000 units CAPS Take 5,000 Units by mouth daily.  . Cranberry-Vitamin C-Probiotic (AZO CRANBERRY) 250-30 MG TABS Take 1 tablet by mouth daily.  . hydrochlorothiazide (HYDRODIURIL) 25 MG tablet Take      1 tablet     Daily      For Fluid Retention / Ankle Swelling  . hydrocortisone cream 1 % Apply 1 application topically daily as needed for itching.  . methocarbamol (ROBAXIN) 500 MG tablet Take by mouth.  . midodrine (PROAMATINE) 10 MG tablet Take 1/2-1 tab thee times daily for low blood pressure.  . NON FORMULARY Arthritis cream  . nystatin (MYCOSTATIN/NYSTOP) powder Apply under breasts 2-3 times a day.  . ondansetron (ZOFRAN-ODT) 4 MG disintegrating tablet Take 4  mg by mouth every 8 (eight) hours as needed for nausea or vomiting.  [name] OVER THE COUNTER MEDICATION Takes Mirlax daily  . Probiotic Product (ALIGN) 4 MG CAPS Take 1 capsule by mouth daily.  . sertraline (ZOLOFT) 100 MG tablet Takes 1.5 tablet at night (Patient taking differently: Takes 1 tablet at night)  . vitamin B-12 (CYANOCOBALAMIN) 500 MCG tablet Take 500 mcg by mouth daily.    Allergies  Allergen Reactions  . Lyrica [Pregabalin] Rash and Other (See Comments)    Weight gain and swelling in hands, legs and feet  . Macrobid [Nitrofurantoin Macrocrystal] Swelling and Rash  . Baclofen Other (See Comments)    Kept her awake, affected memory  . Cephalosporins Other  (See Comments)    Cannot sleep  . Codeine Nausea Only    dizziness  . Darvocet [Propoxyphene N-Acetaminophen] Nausea And Vomiting  . Diclofenac Rash    The gel caused a rash  . Levaquin [Levofloxacin] Hives  . Penicillins Rash  . Selegiline Hcl Rash  . Tramadol Nausea And Vomiting  . Triamcinolone Itching    PMHx:   Past Medical History:  Diagnosis Date  . Anemia    as young girl and teenager  . Arthritis   . Chest pain, unspecified   . Constipation   . Costochondritis   . GERD (gastroesophageal reflux disease)   . Headache    ocular migraines  . History of hiatal hernia   . Hyperlipidemia 10-07-12  . Never smoked tobacco   . Parkinson's disease ([place]) 10-07-12  . Parkinson's disease ([place])   . Prediabetes   . 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 Spinal stenosis     Immunization History  Administered Date(s) Administered  . Influenza, High Dose Seasonal PF 09/08/2014, 10/04/2015, 09/02/2019, 08/30/2020  . Influenza-Unspecified 09/29/2016, 08/08/2017, 08/22/2018  . PFIZER(Purple Top)SARS-COV-2 Vaccination 12/22/2019, 01/23/2020  . Pneumococcal Conjugate-13 11/10/2015  . Pneumococcal Polysaccharide-23 02/26/2017  . Pneumococcal-Unspecified 11/27/2006  . Td 09/08/2013  . Zoster 06/27/2006    Past Surgical History:  Procedure Laterality Date  . ABDOMINAL HYSTERECTOMY    . APPENDECTOMY    . BACK SURGERY  12/20/2015  . BACK SURGERY  09/27/2017  . BACK SURGERY  2020  . BUNIONECTOMY Bilateral 11-11-3  . CARDIAC CATHETERIZATION Bilateral 2013  . COLONOSCOPY    . HERNIA REPAIR    . LEFT HEART CATHETERIZATION WITH CORONARY ANGIOGRAM N/A 07/03/2012   Procedure: LEFT HEART CATHETERIZATION WITH CORONARY ANGIOGRAM;  Surgeon: Troy Sine, MD;  Location: [facility] CATH LAB;  Service: Cardiovascular;  Laterality: N/A;  . REPAIR OF CEREBROSPINAL FLUID LEAK N/A 12/24/2015   Procedure: LUMBAR WOUND EXPLORATION, REPAIR OF CEREBROSPINAL FLUID LEAK, PLACEMENT OF LUMBAR DRAIN;   Surgeon: Kevan Ny Ditty, MD;  Location: [facility];  Service: Neurosurgery;  Laterality: N/A;  . ROTATOR CUFF REPAIR Left 10-07-12    FHx:    Reviewed / unchanged  SHx:    Reviewed / unchanged   Systems Review:  Constitutional: Denies fever, chills, wt changes, headaches, insomnia, fatigue, night sweats, change in appetite. Eyes: Denies redness, blurred vision, diplopia, discharge, itchy, watery eyes.  ENT: Denies discharge, congestion, post nasal drip, epistaxis, sore throat, earache, hearing loss, dental pain, tinnitus, vertigo, sinus pain, snoring.  CV: Denies chest pain, palpitations, irregular heartbeat, syncope, dyspnea, diaphoresis, orthopnea, PND, claudication or edema. Respiratory: denies cough, dyspnea, DOE, pleurisy, hoarseness, laryngitis, wheezing.  Gastrointestinal: Denies dysphagia, odynophagia, heartburn, reflux, water brash, abdominal pain or cramps, nausea, vomiting, bloating, diarrhea, constipation, hematemesis, melena,  hematochezia  or hemorrhoids. Genitourinary: Denies dysuria, frequency, urgency, nocturia, hesitancy, discharge, hematuria or flank pain. Musculoskeletal: Denies arthralgias, myalgias, stiffness, jt. swelling, pain, limping or strain/sprain.  Skin: Denies pruritus, rash, hives, warts, acne, eczema or change in skin lesion(s). Neuro: No weakness, tremor, incoordination, spasms, paresthesia or pain. Psychiatric: Denies confusion, memory loss or sensory loss. Endo: Denies change in weight, skin or hair change.  Heme/Lymph: No excessive bleeding, bruising or enlarged lymph nodes.  Physical Exam  BP 106/76   Pulse 94   Temp (!) 97.2 F (36.2 C)   Resp 16   SpO2 96%   Appears over nourished  and in no distress.  Eyes: PERRLA, EOMs, conjunctiva no swelling or erythema. Sinuses: No frontal/maxillary tenderness ENT/Mouth: EAC's clear, TM's nl w/o erythema, bulging. Nares clear w/o erythema, swelling, exudates. Oropharynx clear without erythema or  exudates. Oral hygiene is good. Tongue normal, non obstructing. Hearing intact.  Neck: Supple. Thyroid not palpable. Car 2+/2+ without bruits, nodes or JVD. Chest: Respirations nl with BS clear & equal w/o rales, rhonchi, wheezing or stridor.  Cor: Heart sounds normal w/ regular rate and rhythm without sig. murmurs, gallops, clicks or rubs. Peripheral pulses normal and equal  without edema.  Abdomen: Soft & bowel sounds normal. Non-tender w/o guarding, rebound, hernias, masses or organomegaly.  Lymphatics: Unremarkable.  Musculoskeletal: Full ROM all peripheral extremities with normal tone. Patient is in a wheelchair. Skin: There is an area of erythema with srusting over the Rt cheek & nose  Neuro: Cranial nerves intact, reflexes equal bilaterally. Sensory-motor testing grossly intact. Tendon reflexes grossly intact. No tremor Pysch: Alert & oriented x 3.  Insight and judgement nl & appropriate. No ideations.  Assessment and Plan:  1. Labile hypertension  - Continue medication, monitor blood pressure at home.  - Continue DASH diet.  Reminder to go to the ER if any CP,  SOB, nausea, dizziness, severe HA, changes vision/speech.  - CBC with Differential/Platelet - COMPLETE METABOLIC PANEL WITH GFR - Magnesium - TSH  2. Idiopathic hypotension   3. Hyperlipidemia, mixed  - Continue diet/meds, exercise,& lifestyle modifications.  - Continue monitor periodic cholesterol/liver & renal functions   - Lipid panel - TSH  4. Abnormal glucose  - Continue diet, exercise  - Lifestyle modifications.  - Monitor appropriate labs.  - Hemoglobin A1c - Insulin, random  5. Vitamin D deficiency  - Continue supplementation.  - VITAMIN D 25 Hydroxy   6. Acute cystitis without hematuria  - Urinalysis, Routine w reflex microscopic - Urine Culture  7. Impetigo  - doxycycline (VIBRAMYCIN) 100 MG capsule; Take 1 capsule 2 x /day for 10 days, then 1 x /day for 10 days & take with meals for  Infection  Disp: 60 capsule  8. Medication management  - CBC with Differential/Platelet - COMPLETE METABOLIC PANEL WITH GFR - Magnesium - Lipid panel - TSH - Hemoglobin A1c - Insulin, random - VITAMIN D 25 Hydroxy         Discussed  regular exercise, BP monitoring, weight control to achieve/maintain BMI less than 25 and discussed med and SE's. Recommended labs to assess and monitor clinical status with further disposition pending results of labs.  I discussed the assessment and treatment plan with the patient. The patient was provided an opportunity to ask questions and all were answered. The patient agreed with the plan and demonstrated an understanding of the instructions.  I provided over 30 minutes of exam, counseling, chart review and  complex critical decision making.  The patient was advised to call back or seek an in-person evaluation if the symptoms worsen or if the condition fails to improve as anticipated.   Kirtland Bouchard, MD

## 2021-01-19 LAB — URINALYSIS, ROUTINE W REFLEX MICROSCOPIC
Bilirubin Urine: NEGATIVE
Glucose, UA: NEGATIVE
Hgb urine dipstick: NEGATIVE
Ketones, ur: NEGATIVE
Leukocytes,Ua: NEGATIVE
Nitrite: NEGATIVE
Protein, ur: NEGATIVE
Specific Gravity, Urine: 1.016 (ref 1.001–1.03)
pH: 5.5 (ref 5.0–8.0)

## 2021-01-19 LAB — CBC WITH DIFFERENTIAL/PLATELET
Absolute Monocytes: 641 cells/uL (ref 200–950)
Basophils Absolute: 29 cells/uL (ref 0–200)
Basophils Relative: 0.4 %
Eosinophils Absolute: 72 cells/uL (ref 15–500)
Eosinophils Relative: 1 %
HCT: 36.4 % (ref 35.0–45.0)
Hemoglobin: 12.3 g/dL (ref 11.7–15.5)
Lymphs Abs: 1541 cells/uL (ref 850–3900)
MCH: 31.9 pg (ref 27.0–33.0)
MCHC: 33.8 g/dL (ref 32.0–36.0)
MCV: 94.3 fL (ref 80.0–100.0)
MPV: 10.5 fL (ref 7.5–12.5)
Monocytes Relative: 8.9 %
Neutro Abs: 4918 cells/uL (ref 1500–7800)
Neutrophils Relative %: 68.3 %
Platelets: 247 10*3/uL (ref 140–400)
RBC: 3.86 10*6/uL (ref 3.80–5.10)
RDW: 12.5 % (ref 11.0–15.0)
Total Lymphocyte: 21.4 %
WBC: 7.2 10*3/uL (ref 3.8–10.8)

## 2021-01-19 LAB — HEMOGLOBIN A1C
Hgb A1c MFr Bld: 5.5 % of total Hgb (ref <5.7)
Mean Plasma Glucose: 111 mg/dL
eAG (mmol/L): 6.2 mmol/L

## 2021-01-19 LAB — COMPLETE METABOLIC PANEL WITH GFR
AG Ratio: 2 (calc) (ref 1.0–2.5)
ALT: 9 U/L (ref 6–29)
AST: 12 U/L (ref 10–35)
Albumin: 4.2 g/dL (ref 3.6–5.1)
Alkaline phosphatase (APISO): 101 U/L (ref 37–153)
BUN/Creatinine Ratio: 29 (calc) — ABNORMAL HIGH (ref 6–22)
BUN: 29 mg/dL — ABNORMAL HIGH (ref 7–25)
CO2: 26 mmol/L (ref 20–32)
Calcium: 9.5 mg/dL (ref 8.6–10.4)
Chloride: 105 mmol/L (ref 98–110)
Creat: 1.01 mg/dL — ABNORMAL HIGH (ref 0.60–0.93)
GFR, Est African American: 62 mL/min/{1.73_m2} (ref >=60)
GFR, Est Non African American: 53 mL/min/{1.73_m2} — ABNORMAL LOW (ref >=60)
Globulin: 2.1 g/dL (calc) (ref 1.9–3.7)
Glucose, Bld: 108 mg/dL — ABNORMAL HIGH (ref 65–99)
Potassium: 4.1 mmol/L (ref 3.5–5.3)
Sodium: 140 mmol/L (ref 135–146)
Total Bilirubin: 0.4 mg/dL (ref 0.2–1.2)
Total Protein: 6.3 g/dL (ref 6.1–8.1)

## 2021-01-19 LAB — TSH: TSH: 0.92 mIU/L (ref 0.40–4.50)

## 2021-01-19 LAB — LIPID PANEL
Cholesterol: 182 mg/dL (ref <200)
HDL: 40 mg/dL — ABNORMAL LOW (ref >=50)
LDL Cholesterol (Calc): 105 mg/dL (calc) — ABNORMAL HIGH
Non-HDL Cholesterol (Calc): 142 mg/dL (calc) — ABNORMAL HIGH (ref <130)
Total CHOL/HDL Ratio: 4.6 (calc) (ref <5.0)
Triglycerides: 260 mg/dL — ABNORMAL HIGH (ref <150)

## 2021-01-19 LAB — URINE CULTURE
MICRO NUMBER:: 11564964
SPECIMEN QUALITY:: ADEQUATE

## 2021-01-19 LAB — VITAMIN D 25 HYDROXY (VIT D DEFICIENCY, FRACTURES): Vit D, 25-Hydroxy: 70 ng/mL (ref 30–100)

## 2021-01-19 LAB — INSULIN, RANDOM: Insulin: 18.6 u[IU]/mL

## 2021-01-19 LAB — MAGNESIUM: Magnesium: 2.1 mg/dL (ref 1.5–2.5)

## 2021-01-19 NOTE — Progress Notes (Signed)
========================================================== -   Test results slightly outside the reference range are not unusual. If there is anything important, I will review this with you,  otherwise it is considered normal test values.  If you have further questions,  please do not hesitate to contact me at the office or via My Chart.  ========================================================== ==========================================================  -  Kidney Functions stable in Stage 2 ========================================================== ==========================================================  -  Total Chol = 182 - Excellent   -  But . . . . . [name]  - LDL = 105 - slightly elevated (Ideal or goal is less than 70)   - So. . . . .  - Recommend a stricter low cholesterol diet   - Cholesterol only comes from animal sources  - ie. meat, dairy, egg yolks  - Eat all the vegetables you want.  - Avoid meat, especially red meat - Beef AND Pork .  - Avoid cheese & dairy - milk & ice cream.     - Cheese is the most concentrated form of trans-fats which  is the worst thing to clog up our arteries.   - Veggie cheese is OK which can be found in the fresh  produce section at [employer] or [employer] or [employer] ============================================================ ============================================================  - Thyroid is normal & OK  ============================================================ ============================================================  - A1c  is Normal - Great -  No Diabetes ! ============================================================ ============================================================  - Urine culture is Negative, ie  No Infection ! ============================================================ ============================================================  -

## 2021-03-08 ENCOUNTER — Other Ambulatory Visit: Payer: Self-pay | Admitting: Neurology

## 2021-03-09 NOTE — Telephone Encounter (Signed)
Rx(s) sent to pharmacy electronically.  

## 2021-04-08 DIAGNOSIS — H524 Presbyopia: Secondary | ICD-10-CM | POA: Diagnosis not present

## 2021-04-08 DIAGNOSIS — H04212 Epiphora due to excess lacrimation, left lacrimal gland: Secondary | ICD-10-CM | POA: Diagnosis not present

## 2021-04-08 DIAGNOSIS — H52223 Regular astigmatism, bilateral: Secondary | ICD-10-CM | POA: Diagnosis not present

## 2021-04-08 DIAGNOSIS — H5203 Hypermetropia, bilateral: Secondary | ICD-10-CM | POA: Diagnosis not present

## 2021-04-19 ENCOUNTER — Encounter: Payer: Medicare Other | Admitting: Adult Health Nurse Practitioner

## 2021-05-01 ENCOUNTER — Encounter: Payer: Self-pay | Admitting: Internal Medicine

## 2021-05-01 NOTE — Patient Instructions (Signed)

## 2021-05-01 NOTE — Progress Notes (Signed)
Annual Screening/Preventative Visit & Comprehensive Evaluation &  Examination  Future Appointments  Date Time Provider [place]  05/02/2021 11:00 AM Unk Pinto, MD [facility] None  06/21/2021  1:00 PM [name], Eustace Quail, DO [facility] None  05/02/2022  2:00 PM Unk Pinto, MD [facility] None        This very nice 80 y.o. MWF  presents for a Screening /Preventative Visit & comprehensive evaluation and management of multiple medical co-morbidities.  Patient has been followed for HTN and neurogenic Orthostatic Hypotension, HLD, Pre-Diabetes, Parkinson's Diseaseand Vitamin D Deficiency.                     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  Patient has had improvement in her LBP on Lyrica.  Patient has advanced Parkinson's Dz (2002)  & is followed closely by Dr [name].  Patient is very unstable in gait & has hx/o multiple falls and has been advised not to attempt ambulation. . Dr [name] attempts to manage patient's Parkinson's meds, but apparently patient is very poorly compliant.         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  Patient's BP has been controlled at home and patient denies any cardiac symptoms as chest pain, palpitations, shortness of breath, dizziness or ankle swelling.  In 2013, she had a Negative Heart Cath. Today's BP is at goal - 112/72.        Patient's hyperlipidemia is controlled with diet and medications. Patient denies myalgias or other medication SE's. Last lipids were near goal albeit elevated Trig's:  Lab Results  Component Value Date   CHOL 182 01/18/2021   HDL 40 (L) 01/18/2021   LDLCALC 105 (H) 01/18/2021   TRIG 260 (H) 01/18/2021   CHOLHDL 4.6 01/18/2021         Patient has hx/o PreDiabetes  (A1c 6.3% /2011 and 5.9%/2013) & is monitored for glucose intolerance and patient denies reactive hypoglycemic symptoms, visual blurring,  diabetic polys or paresthesias. Last A1c was normal & at goal:  Lab Results  Component Value Date   HGBA1C 5.5 01/18/2021         Finally, patient has history of Vitamin D Deficiency ("23" /2008)and last Vitamin D was at goal:  Lab Results  Component Value Date   VD25OH 37 01/18/2021     Current Outpatient Medications on File Prior to Visit  Medication Sig  . acetaminophen  500 MG tablet Take 1,000 mg  3  times daily.  [name] albuterol  HFA inhaler Inhale 1-2 puffs  every 6 hours as needed for wheezing   . SINEMET CR 50-200 MG tablet Take 1 tablet by mouth at bedtime.  [name] SINEMET IR 25-100 MG tablet TAKE 2 TABS AT 5AM, TAKE 1 TAB AT 7AM, TAKE 2 TABS AT 9AM, TAKE 1 TAB AT 11AM, TAKE 2 TABS AT 1PM, TAKE 1 TAB AT 3PM, TAKE 1 TAB AT 5PM  . VITAMIN D 5000 units  Take daily.  . Cranberry-Vitamin C-Probiotic (AZO CRANBERRY) 250-30 MG TABS Take 1 tablet daily.  . hydrochlorothiazide 25 MG tablet Take 1 tablet Daily    . methocarbamol 500 MG tablet Take as needed  . midodrine  10 MG tablet Take 1/2-1 tab thee times daily for low blood pressure.  . NON FORMULARY Arthritis cream  . nystatin powder Apply under breasts 2-3 times a day as needed .  [name]  ondansetron-ODT 4 MG  Take 4 mg  every 8  hours as needed for nausea or vomiting.  .  Mirlax Takes daily  . Probiotic / ALIGN 4 MG CAPS Take 1 capsule  daily.  . sertraline  100 MG tablet Takes 1 tablet at night  . vitamin B-12  500 MCG tablet Take  daily.     Allergies  Allergen Reactions  . Lyrica [Pregabalin] Rash and Other (See Comments)    Weight gain and swelling in hands, legs and feet  . Macrobid [Nitrofurantoin Macrocrystal] Swelling and Rash  . Baclofen Other (See Comments)    Kept her awake, affected memory  . Cephalosporins Other (See Comments)    Cannot sleep  . Codeine Nausea Only    dizziness  . Darvocet [Propoxyphene N-Acetaminophen] Nausea And Vomiting  . Diclofenac Rash    The gel caused a rash  . Levaquin [Levofloxacin]  Hives  . Penicillins Rash  . Selegiline Hcl Rash  . Tramadol Nausea And Vomiting  . Triamcinolone Itching     Past Medical History:  Diagnosis Date  . Anemia    as young girl and teenager  . Arthritis   . Chest pain, unspecified   . Constipation   . Costochondritis   . GERD (gastroesophageal reflux disease)   . Headache    ocular migraines  . History of hiatal hernia   . Hyperlipidemia 10-07-12  . Never smoked tobacco   . Parkinson's disease ([place]) 10-07-12  . Parkinson's disease ([place])   . Prediabetes   . 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 Spinal stenosis      Health Maintenance  Topic Date Due  . Pneumococcal Vaccine 64-18 Years old (1 of 4 - PCV13) Never done  . Hepatitis C Screening  Never done  . Zoster Vaccines- Shingrix (1 of 2) Never done  . COVID-19 Vaccine (3 - Booster for Pfizer series) 06/21/2020  . INFLUENZA VACCINE  06/27/2021  . TETANUS/TDAP  09/09/2023  . DEXA SCAN  Completed  . PNA vac Low Risk Adult  Completed  . HPV VACCINES  Aged Out     Immunization History  Administered Date(s) Administered  . Influenza, High Dose   09/08/2014, 10/04/2015, 09/02/2019, 08/30/2020  . Influenza  09/29/2016, 08/08/2017, 08/22/2018  . PFIZER SARS-COV-2 Vacc 12/22/2019, 01/23/2020  . Pneumococcal  -13 11/10/2015  . Pneumococcal -23 02/26/2017  . Pneumococcal- 23 11/27/2006  . Td 09/08/2013  . Zoster, Live 06/27/2006    Colon  - 06/28/1993 - Dr Teena Irani  - Rectal Polyp villous adenoma Colon   - 02/2014 - Diverticulosis  Cologard - 03/26/2017 - Positive - referred back to Dr Amedeo Plenty  -  11/11/2019 - Seen consult by Dr Wilford Corner & recommended a colonoscopy    EGD - 05/02/2010 -  Dr Teena Irani    Last MGM - 10/09/2013    Past Surgical History:  Procedure Laterality Date  . ABDOMINAL HYSTERECTOMY    . APPENDECTOMY    . BACK SURGERY  12/20/2015  . BACK SURGERY  09/27/2017  . BACK SURGERY  2020  . BUNIONECTOMY Bilateral 11-11-3  . CARDIAC  CATHETERIZATION Bilateral 2013  . COLONOSCOPY    . HERNIA REPAIR    . LEFT HEART CATHETERIZATION WITH CORONARY ANGIOGRAM N/A 07/03/2012   Procedure: LEFT HEART CATHETERIZATION WITH CORONARY ANGIOGRAM;  Surgeon: Troy Sine, MD;  Location: [facility] CATH LAB;  Service: Cardiovascular;  Laterality: N/A;  . REPAIR OF CEREBROSPINAL FLUID LEAK N/A 12/24/2015   Procedure:  LUMBAR WOUND EXPLORATION, REPAIR OF CEREBROSPINAL FLUID LEAK, PLACEMENT OF LUMBAR DRAIN;  Surgeon: Kevan Ny Ditty, MD;  Location: [facility] NEURO ORS;  Service: Neurosurgery;  Laterality: N/A;  . ROTATOR CUFF REPAIR Left 10-07-12     Family History  Problem Relation Age of Onset  . Heart attack Mother   . Esophageal cancer Mother   . Suicidality Father        suicide   . Suicidality Brother        suicide   . CVA Brother   . Hypertension Brother   . Hypercholesterolemia Brother   . Mental retardation Brother      Social History   Tobacco Use  . Smoking status: Never Smoker  . Smokeless tobacco: Never Used  Vaping Use  . Vaping Use: Never used  Substance Use Topics  . Alcohol use: No    Alcohol/week: 0.0 standard drinks  . Drug use: No      ROS Constitutional: Denies fever, chills, weight loss/gain, headaches, insomnia,  night sweats, and change in appetite. Does c/o fatigue. Eyes: Denies redness, blurred vision, diplopia, discharge, itchy, watery eyes.  ENT: Denies discharge, congestion, post nasal drip, epistaxis, sore throat, earache, hearing loss, dental pain, Tinnitus, Vertigo, Sinus pain, snoring.  Cardio: Denies chest pain, palpitations, irregular heartbeat, syncope, dyspnea, diaphoresis, orthopnea, PND, claudication, edema Respiratory: denies cough, dyspnea, DOE, pleurisy, hoarseness, laryngitis, wheezing.  Gastrointestinal: Denies dysphagia, heartburn, reflux, water brash, pain, cramps, nausea, vomiting, bloating, diarrhea, constipation, hematemesis, melena, hematochezia, jaundice, hemorrhoids Genitourinary:  Denies dysuria, frequency, urgency, nocturia, hesitancy, discharge, hematuria, flank pain Breast: Breast lumps, nipple discharge, bleeding.  Musculoskeletal: Denies arthralgia, myalgia, stiffness, Jt. Swelling, pain, limp, and strain/sprain. Denies falls. Skin: Denies puritis, rash, hives, warts, acne, eczema, changing in skin lesion Neuro: No weakness, tremor, incoordination, spasms, paresthesia, pain Psychiatric: Denies confusion, memory loss, sensory loss. Denies Depression. Endocrine: Denies change in weight, skin, hair change, nocturia, and paresthesia, diabetic polys, visual blurring, hyper / hypo glycemic episodes.  Heme/Lymph: No excessive bleeding, bruising, enlarged lymph nodes.  Physical Exam  BP 112/72 (BP Location: Right Arm, Patient Position: Sitting, Cuff Size: Normal)   Pulse 84   Temp (!) 97.5 F (36.4 C)   Ht 4\' 11"  (1.499 m)   Wt 148 lb 3.2 oz (67.2 kg)   SpO2 99%   BMI 29.93 kg/m   General Appearance: Well nourished, well groomed and in no apparent distress.  Eyes: PERRLA, EOMs, conjunctiva no swelling or erythema, normal fundi and vessels. Sinuses: No frontal/maxillary tenderness ENT/Mouth: EACs patent / TMs  nl. Nares clear without erythema, swelling, mucoid exudates. Oral hygiene is good. No erythema, swelling, or exudate. Tongue normal, non-obstructing. Tonsils not swollen or erythematous. Hearing normal.  Neck: Supple, thyroid not palpable. No bruits, nodes or JVD. Respiratory: Respiratory effort normal.  BS equal and clear bilateral without rales, rhonci, wheezing or stridor. Cardio: Heart sounds are normal with regular rate and rhythm and no murmurs, rubs or gallops. Peripheral pulses are normal and equal bilaterally without edema. No aortic or femoral bruits. Chest: symmetric with normal excursions and percussion. Breasts: Symmetric, without lumps, nipple discharge, retractions, or fibrocystic changes. Candidiasis rash beneath breasts.  Abdomen: protuberant,  soft with bowel sounds active. Nontender, no guarding, rebound, hernias, masses or organomegaly.  Lymphatics: Non tender without lymphadenopathy.  Musculoskeletal:  FROM w/o deformity.  Nl muscle tone . No cog wheeling. Lateral splaying of  Fingers / MCP jts - Lt>>Rt with Heberden's / Bouchard's  nodes.  Patient is in a  wheelchair.  Skin: Warm and dry without rashes, lesions, cyanosis, clubbing or  ecchymosis.  Neuro: Masked facies.Cranial nerves intact, reflexes equal bilaterally. Normal muscle tone, no cerebellar symptoms. Sensation intact.  Pysch: Alert and oriented X 3, normal affect, Insight and Judgment appropriate.    Assessment and Plan  1. Labile hypertension  - EKG 12-Lead - Urinalysis, Routine w reflex microscopic - CBC with Differential/Platelet - COMPLETE METABOLIC PANEL WITH GFR - Magnesium - TSH  2. Idiopathic hypotension  - EKG 12-Lead  3. Hyperlipidemia, mixed  - EKG 12-Lead - Lipid panel - TSH  4. Abnormal glucose  - EKG 12-Lead - Hemoglobin A1c - Insulin, random  5. Vitamin D deficiency  - VITAMIN D 25 Hydroxy   6. Parkinson disease ([place])   7. Unstable gait   8. At high risk for falls   9. Chronic pain syndrome   10. Age-related osteoporosis   - COMPLETE METABOLIC PANEL WITH GFR  11. Screening for ischemic heart disease  - EKG 12-Lead  12. FHx: heart disease  - EKG 12-Lead  13. Medication management  - Urinalysis, Routine w reflex microscopic - Microalbumin / creatinine urine ratio - CBC with Differential/Platelet - COMPLETE METABOLIC PANEL WITH GFR - Magnesium - Lipid panel - TSH - Hemoglobin A1c - Insulin, random - VITAMIN D 25 Hydroxy   14. Screening for colorectal cancer  - POC Hemoccult Bld/Stl          Patient was counseled in prudent diet to achieve/maintain BMI less than 25 for weight control, BP monitoring, regular exercise and medications. Discussed med's effects and SE's. Screening labs and tests as  requested with regular follow-up as recommended. Over 40 minutes of exam, counseling, chart review and high complex critical decision making was performed.   Kirtland Bouchard, MD

## 2021-05-02 ENCOUNTER — Other Ambulatory Visit: Payer: Self-pay

## 2021-05-02 ENCOUNTER — Ambulatory Visit (INDEPENDENT_AMBULATORY_CARE_PROVIDER_SITE_OTHER): Payer: Medicare Other | Admitting: Internal Medicine

## 2021-05-02 VITALS — BP 112/72 | HR 84 | Temp 97.5°F | Ht 59.0 in | Wt 148.2 lb

## 2021-05-02 DIAGNOSIS — Z79899 Other long term (current) drug therapy: Secondary | ICD-10-CM

## 2021-05-02 DIAGNOSIS — Z136 Encounter for screening for cardiovascular disorders: Secondary | ICD-10-CM | POA: Diagnosis not present

## 2021-05-02 DIAGNOSIS — Z1212 Encounter for screening for malignant neoplasm of rectum: Secondary | ICD-10-CM

## 2021-05-02 DIAGNOSIS — G894 Chronic pain syndrome: Secondary | ICD-10-CM

## 2021-05-02 DIAGNOSIS — E559 Vitamin D deficiency, unspecified: Secondary | ICD-10-CM

## 2021-05-02 DIAGNOSIS — M8000XS Age-related osteoporosis with current pathological fracture, unspecified site, sequela: Secondary | ICD-10-CM | POA: Diagnosis not present

## 2021-05-02 DIAGNOSIS — Z9181 History of falling: Secondary | ICD-10-CM

## 2021-05-02 DIAGNOSIS — R0989 Other specified symptoms and signs involving the circulatory and respiratory systems: Secondary | ICD-10-CM

## 2021-05-02 DIAGNOSIS — B3789 Other sites of candidiasis: Secondary | ICD-10-CM

## 2021-05-02 DIAGNOSIS — R2681 Unsteadiness on feet: Secondary | ICD-10-CM | POA: Diagnosis not present

## 2021-05-02 DIAGNOSIS — G20 Parkinson's disease: Secondary | ICD-10-CM | POA: Diagnosis not present

## 2021-05-02 DIAGNOSIS — R7309 Other abnormal glucose: Secondary | ICD-10-CM

## 2021-05-02 DIAGNOSIS — Z1211 Encounter for screening for malignant neoplasm of colon: Secondary | ICD-10-CM

## 2021-05-02 DIAGNOSIS — I950 Idiopathic hypotension: Secondary | ICD-10-CM

## 2021-05-02 DIAGNOSIS — Z8249 Family history of ischemic heart disease and other diseases of the circulatory system: Secondary | ICD-10-CM

## 2021-05-02 DIAGNOSIS — E782 Mixed hyperlipidemia: Secondary | ICD-10-CM

## 2021-05-02 MED ORDER — FLUCONAZOLE 100 MG PO TABS: ORAL_TABLET | ORAL | 1 refills | Status: DC

## 2021-05-03 ENCOUNTER — Other Ambulatory Visit: Payer: Self-pay | Admitting: Internal Medicine

## 2021-05-03 LAB — COMPLETE METABOLIC PANEL WITH GFR
AG Ratio: 2.3 (calc) (ref 1.0–2.5)
ALT: 9 U/L (ref 6–29)
AST: 17 U/L (ref 10–35)
Albumin: 4.5 g/dL (ref 3.6–5.1)
Alkaline phosphatase (APISO): 120 U/L (ref 37–153)
BUN/Creatinine Ratio: 25 (calc) — ABNORMAL HIGH (ref 6–22)
BUN: 25 mg/dL (ref 7–25)
CO2: 27 mmol/L (ref 20–32)
Calcium: 9.8 mg/dL (ref 8.6–10.4)
Chloride: 107 mmol/L (ref 98–110)
Creat: 1.02 mg/dL — ABNORMAL HIGH (ref 0.60–0.93)
GFR, Est African American: 61 mL/min/{1.73_m2} (ref >=60)
GFR, Est Non African American: 52 mL/min/{1.73_m2} — ABNORMAL LOW (ref >=60)
Globulin: 2 g/dL (calc) (ref 1.9–3.7)
Glucose, Bld: 95 mg/dL (ref 65–99)
Potassium: 4.7 mmol/L (ref 3.5–5.3)
Sodium: 142 mmol/L (ref 135–146)
Total Bilirubin: 0.4 mg/dL (ref 0.2–1.2)
Total Protein: 6.5 g/dL (ref 6.1–8.1)

## 2021-05-03 LAB — CBC WITH DIFFERENTIAL/PLATELET
Absolute Monocytes: 459 cells/uL (ref 200–950)
Basophils Absolute: 38 cells/uL (ref 0–200)
Basophils Relative: 0.7 %
Eosinophils Absolute: 81 cells/uL (ref 15–500)
Eosinophils Relative: 1.5 %
HCT: 37.4 % (ref 35.0–45.0)
Hemoglobin: 12.2 g/dL (ref 11.7–15.5)
Lymphs Abs: 896 cells/uL (ref 850–3900)
MCH: 30.9 pg (ref 27.0–33.0)
MCHC: 32.6 g/dL (ref 32.0–36.0)
MCV: 94.7 fL (ref 80.0–100.0)
MPV: 11.1 fL (ref 7.5–12.5)
Monocytes Relative: 8.5 %
Neutro Abs: 3926 cells/uL (ref 1500–7800)
Neutrophils Relative %: 72.7 %
Platelets: 272 10*3/uL (ref 140–400)
RBC: 3.95 10*6/uL (ref 3.80–5.10)
RDW: 12.4 % (ref 11.0–15.0)
Total Lymphocyte: 16.6 %
WBC: 5.4 10*3/uL (ref 3.8–10.8)

## 2021-05-03 LAB — VITAMIN D 25 HYDROXY (VIT D DEFICIENCY, FRACTURES): Vit D, 25-Hydroxy: 76 ng/mL (ref 30–100)

## 2021-05-03 LAB — URINALYSIS, ROUTINE W REFLEX MICROSCOPIC
Bilirubin Urine: NEGATIVE
Glucose, UA: NEGATIVE
Hgb urine dipstick: NEGATIVE
Nitrite: POSITIVE — AB
Specific Gravity, Urine: 1.033 (ref 1.001–1.035)
WBC, UA: >=60 /HPF — AB (ref 0–5)
Yeast: NONE SEEN /HPF
pH: <=5 (ref 5.0–8.0)

## 2021-05-03 LAB — LIPID PANEL
Cholesterol: 232 mg/dL — ABNORMAL HIGH (ref <200)
HDL: 50 mg/dL (ref >=50)
LDL Cholesterol (Calc): 147 mg/dL (calc) — ABNORMAL HIGH
Non-HDL Cholesterol (Calc): 182 mg/dL (calc) — ABNORMAL HIGH (ref <130)
Total CHOL/HDL Ratio: 4.6 (calc) (ref <5.0)
Triglycerides: 211 mg/dL — ABNORMAL HIGH (ref <150)

## 2021-05-03 LAB — TSH: TSH: 1.16 mIU/L (ref 0.40–4.50)

## 2021-05-03 LAB — HEMOGLOBIN A1C
Hgb A1c MFr Bld: 5.4 % of total Hgb (ref <5.7)
Mean Plasma Glucose: 108 mg/dL
eAG (mmol/L): 6 mmol/L

## 2021-05-03 LAB — MAGNESIUM: Magnesium: 2.2 mg/dL (ref 1.5–2.5)

## 2021-05-03 LAB — INSULIN, RANDOM: Insulin: 8.6 u[IU]/mL

## 2021-05-03 LAB — MICROSCOPIC MESSAGE

## 2021-05-03 MED ORDER — SULFAMETHOXAZOLE-TRIMETHOPRIM 400-80 MG PO TABS: ORAL_TABLET | ORAL | 0 refills | Status: DC

## 2021-05-03 NOTE — Progress Notes (Signed)
============================================================ - Test results slightly outside the reference range are not unusual. If there is anything important, I will review this with you,  otherwise it is considered normal test values.  If you have further questions,  please do not hesitate to contact me at the office or via My Chart.  ============================================================ ============================================================  -  U?a highly suspect for UTI, so sent in a Rx for an Antibiotic                                                                                 to your [place]  ============================================================ ============================================================  -  Please call office to schedule a Nurse Visit in 1 month                                                                                  to recheck urine for Infection ============================================================ ============================================================  -  Kidney functions still slightly decreased last 2 tests & appear dehydrated.    - Very important to drink adequate amounts of fluids                                                                 to prevent permanent Kidney damage    - Recommend drink at least 6 bottles (16 ounces)                                                                  of fluids /water /day = 96 oz ~100 oz  - 100 oz = 3,000 cc or 3 liters / day  - >>                                               That's 1 &1/2 bottles of a 2 liter soda bottle /day !  ============================================================ ============================================================  - Recommend  a stricter low cholesterol diet   - Cholesterol only comes from animal sources  - ie. meat, dairy, egg yolks  - Eat all the vegetables you want.  - Avoid meat, especially red meat -  Beef AND Pork .  - Avoid cheese & dairy - milk & ice cream.     - Cheese is the most concentrated form of trans-fats which  is the worst thing to clog up our arteries.   -  Veggie cheese is OK which can be found in the fresh  produce section at [employer] or [employer] or [employer] ============================================================ ============================================================  -  A1c - Normal - Great - No Diabetes  !  ============================================================ ============================================================  -  Vitamin D = 76 - Excellent   !  !  ! ============================================================ ============================================================  -  All Else - CBC - Electrolytes - Liver - Magnesium & Thyroid    - all  Normal / OK ============================================================ ============================================================

## 2021-05-04 ENCOUNTER — Other Ambulatory Visit: Payer: Self-pay | Admitting: Internal Medicine

## 2021-05-04 MED ORDER — DICYCLOMINE HCL 20 MG PO TABS: ORAL_TABLET | ORAL | 0 refills | Status: DC

## 2021-05-23 ENCOUNTER — Other Ambulatory Visit: Payer: Self-pay | Admitting: Internal Medicine

## 2021-05-23 DIAGNOSIS — F32A Depression, unspecified: Secondary | ICD-10-CM

## 2021-06-01 DIAGNOSIS — H52223 Regular astigmatism, bilateral: Secondary | ICD-10-CM | POA: Diagnosis not present

## 2021-06-01 DIAGNOSIS — H5203 Hypermetropia, bilateral: Secondary | ICD-10-CM | POA: Diagnosis not present

## 2021-06-01 DIAGNOSIS — H524 Presbyopia: Secondary | ICD-10-CM | POA: Diagnosis not present

## 2021-06-01 DIAGNOSIS — H40053 Ocular hypertension, bilateral: Secondary | ICD-10-CM | POA: Diagnosis not present

## 2021-06-01 DIAGNOSIS — I10 Essential (primary) hypertension: Secondary | ICD-10-CM | POA: Diagnosis not present

## 2021-06-03 DIAGNOSIS — Z20822 Contact with and (suspected) exposure to covid-19: Secondary | ICD-10-CM | POA: Diagnosis not present

## 2021-06-07 ENCOUNTER — Ambulatory Visit: Payer: Medicare Other

## 2021-06-16 ENCOUNTER — Telehealth: Payer: Self-pay | Admitting: Internal Medicine

## 2021-06-16 NOTE — Chronic Care Management (AMB) (Signed)
  Chronic Care Management   Note  06/16/2021 Name: Alexandra Lindsey MRN: [id number] DOB: [birth year]  Alexandra Lindsey is a 80 y.o. year old female who is a primary care patient of Unk Pinto, MD. I reached out to Cira Servant by phone today in response to a referral sent by Ms. Darletta Moll Lugar's PCP, Unk Pinto, MD.   Ms. Scalf was given information about Chronic Care Management services today including:  CCM service includes personalized support from designated clinical staff supervised by her physician, including individualized plan of care and coordination with other care providers 24/7 contact phone numbers for assistance for urgent and routine care needs. Service will only be billed when office clinical staff spend 20 minutes or more in a month to coordinate care. Only one practitioner may furnish and bill the service in a calendar month. The patient may stop CCM services at any time (effective at the end of the month) by phone call to the office staff.   Patient agreed to services and verbal consent obtained.   Follow up plan:   Tatjana Secretary/administrator

## 2021-06-20 NOTE — Progress Notes (Signed)
Assessment/Plan:   1.  Parkinsons Disease  -discussed again to take carbidopa/levodopa 25/100 on the following schedule: , 2 at 5am/1 at 7am/2 at 9am/1 at 11am/ 2 at 1pm/ 1 at 3pm/1 at 5pm and patient has added 1 at bedtime.   -discussed recommendations, as previous, I that the patient is not to be walking at all and to be seated 100% of the time, unless someone is transferring with her or directly holding onto her.  -Has declined power wheelchair.    -Met with my LCSW today.  I think that potentially doing some counseling could be of value.  Joining the [employer] support group, if it starts back up, could be of value as well.  She used to be very active and that.  2.  PDD with Parkinson's hallucinations  -start nuplazid again.  Still has samples from years ago, 34 mg, but don't want using b/c are expired.    Will give new samples.  Discussed black box warning.  Discussed risks, benefits, side effects.  Discussed expectations.  3.  Neurogenic Orthostatic Hypotension  -pt rarely using midodrine.    4.  Constipation  -does well with rancho recipe Subjective:   Alexandra Lindsey was seen today in follow up for Parkinsons disease.  My previous records were reviewed prior to todays visit as well as outside records available to me.   This patient is accompanied in the office by her  husband  who supplements the history.  Patient last saw her primary care physician on June 6.  Those records are reviewed.  Patient had a urinary tract infection at the time.  Pt states that she has had several this year.  Not drinking as much water as she would like due to need to use BR.  BP running good.  Having some hallucinations - sees 4-5 women that "live [place]."  Sees them in the house as well.  States that it is scary and an uneasy feeling.  She has caregivers 12 hours per week.  States that she d/c lyrica due to swelling.  Having "swelling" in abdomen that she doesn't think is due to constipation.  She  has had regular BM's over last week.  Uses rancho recipe.  Has had multiple falls.   Current prescribed movement disorder medications: Carbidopa/levodopa 25/100,  2 at 5am/1 at 7am/2 at 9am/1 at 11am/ 2 at 1pm/ 1 at 3pm/1 at 5pm (and 1 at bed) Midodrine 10 mg 3 times per day (states that only taking prn based on BP and states that she doesn't need to do it often - lightheaded spell few days/week)  PREVIOUS MEDICATIONS: Pramipexole (stopped because of hallucinations); stalevo; klonopin; rytary; nuplazid (did not take due to cost); carbidopa/levodopa 50/200 at bedtime (patient just did not take it long after giving it and stopped for unknown reason)  ALLERGIES:   Allergies  Allergen Reactions   Lyrica [Pregabalin] Rash and Other (See Comments)    Weight gain and swelling in hands, legs and feet   Macrobid [Nitrofurantoin Macrocrystal] Swelling and Rash   Baclofen Other (See Comments)    Kept her awake, affected memory   Cephalosporins Other (See Comments)    Cannot sleep   Codeine Nausea Only    dizziness   Darvocet [Propoxyphene N-Acetaminophen] Nausea And Vomiting   Diclofenac Rash    The gel caused a rash   Levaquin [Levofloxacin] Hives   Penicillins Rash   Selegiline Hcl Rash   Tramadol Nausea And Vomiting  Triamcinolone Itching    CURRENT MEDICATIONS:  Outpatient Encounter Medications as of 06/21/2021  Medication Sig   acetaminophen (TYLENOL) 500 MG tablet Take 1,000 mg by mouth 3 (three) times daily.   albuterol (PROVENTIL HFA;VENTOLIN HFA) 108 (90 Base) MCG/ACT inhaler Inhale 1-2 puffs into the lungs every 6 (six) hours as needed for wheezing or shortness of breath.   carbidopa-levodopa (SINEMET IR) 25-100 MG tablet TAKE 2 TABS AT 5AM, TAKE 1 TAB AT 7AM, TAKE 2 TABS AT 9AM, TAKE 1 TAB AT 11AM, TAKE 2 TABS AT 1PM, TAKE 1 TAB AT 3PM, TAKE 1 TAB AT 5PM (Patient taking differently: TAKE 2 TABS AT 5AM, TAKE 1 TAB AT 7AM, TAKE 2 TABS AT 9AM, TAKE 1 TAB AT 11AM, TAKE 2 TABS AT 1PM,  TAKE 1 TAB AT 3PM, TAKE 1 TAB AT 5PM AND 1 TAB AT BEDTIME)   Cholecalciferol (VITAMIN D3) 5000 units CAPS Take 5,000 Units by mouth daily.   Cranberry-Vitamin C-Probiotic (AZO CRANBERRY) 250-30 MG TABS Take 1 tablet by mouth daily.   fluconazole (DIFLUCAN) 100 MG tablet Take  1 tablet  Daily  for 2 week for Yeast Infection   hydrochlorothiazide (HYDRODIURIL) 25 MG tablet Take      1 tablet     Daily      For Fluid Retention / Ankle Swelling   methocarbamol (ROBAXIN) 500 MG tablet Take by mouth.   midodrine (PROAMATINE) 10 MG tablet Take 1/2-1 tab thee times daily for low blood pressure.   NON FORMULARY Arthritis cream   nystatin (MYCOSTATIN/NYSTOP) powder Apply under breasts 2-3 times a day.   ondansetron (ZOFRAN-ODT) 4 MG disintegrating tablet Take 4 mg by mouth every 8 (eight) hours as needed for nausea or vomiting.   OVER THE COUNTER MEDICATION Takes Mirlax daily   Probiotic Product (ALIGN) 4 MG CAPS Take 1 capsule by mouth daily.   sertraline (ZOLOFT) 100 MG tablet TAKE 1 & 1/2 TABLETS BY MOUTH AT NIGHT (Patient taking differently: TAKE 1 TABLET BY MOUTH AT NIGHT)   sulfamethoxazole-trimethoprim (BACTRIM) 400-80 MG tablet Take  1 tablet 2 x /day with for UTI   vitamin B-12 (CYANOCOBALAMIN) 500 MCG tablet Take 500 mcg by mouth daily.   dicyclomine (BENTYL) 20 MG tablet Take  1/2 to 1 tablet   3 x /day  bedfore Meals for abdominal Cramping, Bloating or Diarrhea (Patient not taking: Reported on 06/21/2021)   [DISCONTINUED] carbidopa-levodopa (SINEMET CR) 50-200 MG tablet Take 1 tablet by mouth at bedtime.   No facility-administered encounter medications on file as of 06/21/2021.    Objective:   PHYSICAL EXAMINATION:    VITALS:   Vitals:   06/21/21 1240  BP: 118/74  Pulse: 97  SpO2: 93%  Weight: 145 lb (65.8 kg)  Height: 5' (1.524 m)     GEN:  The patient appears stated age and is in NAD. HEENT:  Normocephalic, atraumatic.  The mucous membranes are moist. The superficial temporal  arteries are without ropiness or tenderness. CV:  RRR Lungs:  CTAB Neck/HEME:  There are no carotid bruits bilaterally.  Neurological examination:  Orientation: The patient is alert and oriented x3. Cranial nerves: There is good facial symmetry with facial hypomimia. The speech is fluent and clear.  She is hypophonic.  Soft palate rises symmetrically and there is no tongue deviation. Hearing is intact to conversational tone. Sensation: Sensation is intact to light touch throughout Motor: Strength is at least antigravity x4.  Movement examination: Tone: There is normal tone in the UE/LE  Abnormal movements: No dyskinesia today Coordination:  There is  decremation with RAM's, especially in the left upper extremity, but testing in the left hand is limited because of orthopedic issues. Gait and Station: Not tested today (primarily because I really do not want her walking).  I have reviewed and interpreted the following labs independently    Chemistry      Component Value Date/Time   NA 142 05/02/2021 1043   K 4.7 05/02/2021 1043   CL 107 05/02/2021 1043   CO2 27 05/02/2021 1043   BUN 25 05/02/2021 1043   CREATININE 1.02 (H) 05/02/2021 1043      Component Value Date/Time   CALCIUM 9.8 05/02/2021 1043   ALKPHOS 94 11/17/2016 1154   AST 17 05/02/2021 1043   ALT 9 05/02/2021 1043   BILITOT 0.4 05/02/2021 1043       Lab Results  Component Value Date   WBC 5.4 05/02/2021   HGB 12.2 05/02/2021   HCT 37.4 05/02/2021   MCV 94.7 05/02/2021   PLT 272 05/02/2021    Lab Results  Component Value Date   TSH 1.16 05/02/2021     Total time spent on today's visit was 40 minutes, including both face-to-face time and nonface-to-face time.  Time included that spent on review of records (prior notes available to me/labs/imaging if pertinent), discussing treatment and goals, answering patient's questions and coordinating care.  Cc:  Unk Pinto, MD

## 2021-06-21 ENCOUNTER — Encounter: Payer: Self-pay | Admitting: Neurology

## 2021-06-21 ENCOUNTER — Ambulatory Visit (INDEPENDENT_AMBULATORY_CARE_PROVIDER_SITE_OTHER): Payer: Medicare Other | Admitting: Neurology

## 2021-06-21 ENCOUNTER — Other Ambulatory Visit: Payer: Self-pay

## 2021-06-21 VITALS — BP 118/74 | HR 97 | Ht 60.0 in | Wt 145.0 lb

## 2021-06-21 DIAGNOSIS — R441 Visual hallucinations: Secondary | ICD-10-CM

## 2021-06-21 DIAGNOSIS — G20 Parkinson's disease: Secondary | ICD-10-CM | POA: Diagnosis not present

## 2021-06-21 NOTE — Patient Instructions (Signed)
Start nuplazid, 34 mg, 1 capsule daily No changes in your other medications   The physicians and staff at [facility] are committed to providing excellent care. You may receive a survey requesting feedback about your experience at our office. We strive to receive "very good" responses to the survey questions. If you feel that your experience would prevent you from giving the office a "very good " response, please contact our office to try to remedy the situation. We may be reached at [phone]. Thank you for taking the time out of your busy day to complete the survey.

## 2021-06-21 NOTE — Progress Notes (Signed)
Medication Samples have been provided to the patient.  Drug name: nuplazid       Strength: '34mg'$         Qty: 4 boxs  LOT: [id number]  Exp.Date: 03/2023  Dosing instructions: take once daily   The patient has been instructed regarding the correct time, dose, and frequency of taking this medication, including desired effects and most common side effects.

## 2021-07-12 ENCOUNTER — Ambulatory Visit: Payer: Medicare Other | Admitting: Neurology

## 2021-07-28 ENCOUNTER — Other Ambulatory Visit: Payer: Self-pay | Admitting: Internal Medicine

## 2021-07-28 DIAGNOSIS — R300 Dysuria: Secondary | ICD-10-CM

## 2021-07-28 MED ORDER — PHENAZOPYRIDINE HCL 200 MG PO TABS: 200.0000 mg | ORAL_TABLET | Freq: Three times a day (TID) | ORAL | 0 refills | Status: AC | PRN

## 2021-07-28 MED ORDER — SULFAMETHOXAZOLE-TRIMETHOPRIM 800-160 MG PO TABS: ORAL_TABLET | ORAL | 0 refills | Status: DC

## 2021-07-29 ENCOUNTER — Other Ambulatory Visit: Payer: Self-pay

## 2021-07-29 DIAGNOSIS — R300 Dysuria: Secondary | ICD-10-CM

## 2021-07-31 LAB — URINALYSIS, ROUTINE W REFLEX MICROSCOPIC
Bilirubin Urine: NEGATIVE
Glucose, UA: NEGATIVE
Hgb urine dipstick: NEGATIVE
Ketones, ur: NEGATIVE
Nitrite: POSITIVE — AB
Protein, ur: NEGATIVE
RBC / HPF: NONE SEEN /HPF (ref 0–2)
Specific Gravity, Urine: 1.022 (ref 1.001–1.035)
pH: 8.5 — ABNORMAL HIGH (ref 5.0–8.0)

## 2021-07-31 LAB — URINE CULTURE
MICRO NUMBER:: 12328172
SPECIMEN QUALITY:: ADEQUATE

## 2021-07-31 LAB — MICROSCOPIC MESSAGE

## 2021-07-31 NOTE — Progress Notes (Signed)
============================================================ ============================================================  -    U/C finally returned and fortunately the bacteria is very sensitive to the Abx that was prescribed .   - So please finish and about a week after finish, suggest NV to recheck Urine. ( U/A & C/S)

## 2021-08-09 ENCOUNTER — Ambulatory Visit: Payer: Medicare Other | Admitting: Nurse Practitioner

## 2021-08-12 DIAGNOSIS — N1831 Chronic kidney disease, stage 3a: Secondary | ICD-10-CM | POA: Insufficient documentation

## 2021-08-12 NOTE — Progress Notes (Signed)
COMPLETE PHYSICAL Assessment:    Diagnoses and all orders for this visit:  Annual Medicare Wellness Visit Due annually  Health maintenance reviewed - declines further mammogram, bone density, would not pursue treatment   Essential hypertension - continue medications, DASH diet, exercise and monitor at home. Call if greater than 130/80.  -     CBC with Differential/Platelet -     COMPLETE METABOLIC PANEL WITH GFR -     TSH -     Lipid panel  Hyperlipidemia, mixed Not aggressively pursued sencondary to otherwise low risk, age and advancing Parkinson's with limited life expectency Discussed dietary and exercise modifications Low fat diet -     Lipid panel  Parkinson disease ([place]) Continue current regimen Follows with neurology, Dr [name]  Vitamin D deficiency Continue supplementation Taking Vitamin D 3,000 IU BID  Abnormal glucose  Discussed dietary and exercise modifications -     Hemoglobin A1c  Irritable bowel syndrome with constipation Doing well at this time, avoid triggers  Gastroesophageal reflux disease, unspecified whether esophagitis present Well managed on current regimen Discussed diet, avoiding triggers and other lifestyle changes  Status post thoracic spinal fusion Lumbar radiculopathy Age-related osteoporosis with current pathological fracture, sequela Has seen Dr. Prince Rome, requesting referral back  Continue tylenol/muscle relaxer, can use ibuprofen sparingly Lifestyle modification discussed  Medication management Continued  Positive colorectal cancer screening using Cologuard test Declines further follow up Saw Dr. Amedeo Plenty, no colonoscopy  RBD (REM behavioral disorder) Continue meds  Chronic constipation - continue miralax, increase fiber indiet, INCREASE FLUID - goal 3-4 bottles Increase activity as able Add senokot daily Follow up if not improves or constipation longer than 3 days  High risk falls Wheelchair per neuro recommendation Will  do home evaluation and PT for transfer education, in home help If limited benefit with home health or coverage issues will transition to palliative/hospice involvement for additional resources    Over 40 minutes of face to face interview, exam, counseling, chart review, and critical decision making was performed  Future Appointments  Date Time Provider [place]  10/11/2021  2:30 PM Rush Landmark, [facility] None  11/08/2021 10:30 AM Unk Pinto, MD [facility] None  12/29/2021  1:00 PM [name], Eustace Quail, DO [facility] None  05/02/2022  2:00 PM Unk Pinto, MD [facility] None  08/16/2022  3:30 PM Liane Comber, NP [facility] None     Plan:   Subjective:  Alexandra Lindsey is a 80 y.o. female who presents for 3 month follow and AWV. She has Parkinson disease ([place]); RBD (REM behavioral disorder); Hyperlipidemia; Hypertension; GERD (gastroesophageal reflux disease); Abnormal glucose; Vitamin D deficiency; Medication management; SI (sacroiliac) pain; Chronic pain syndrome; Lumbar radiculopathy; Lumbar stenosis with neurogenic claudication; Positive colorectal cancer screening using Cologuard test; Frequent falls; S/P spinal fusion; Postlaminectomy syndrome, not elsewhere classified; Osteoporosis; and Stage 3a chronic kidney disease ([place]) on their problem list.  She is accompanied by her husband who is her primary caregiver.   Follows closely with Dr. Carles Collet for advancing Parkinson's, on sinemet IR - 2 tablets at 5 AM, 2 tablets at 8 AM, 1 tablet at noon, 1 tablet at 4 PM, 1 tablet at 7 PM, 2 tablets at bedtime.  Off of mirapex due to hallucinations, but states still having some, Dr. Carles Collet is aware.   She is very unstable r/t Parkinson's with high fall risk, numerous falls with injury, has transitioned from walker to Wheelchair per neuro recommendations. Functional incontinence, wears depends. Husband is primary caregiver, has 24/7 needs,  does pay out of pocket for in home help twice a week,  but would benefit from additional assistance.   She is currently prescribed sertraline 100 mg daily for mood, she does feel this helps, doing fairly.   She is seeing Dr Prince Rome related to multiple fractures and she is s/p thoracic spinal fusion. She reports chronic pain following, some good/bad days, but sitting in chair 20 min will cause severe pain, lying down helps. Taking tylenol 1000 mg TID, baclofen with variable benefit. Requests referral back to Dr. Prince Rome today.   She had a + cologuard in 2018, saw Dr. Amedeo Plenty who recommended monitoring only, declines further colonoscopy, no blood in stool. However she does report constipation ongoing, has been taking miralax, fiber supplement. Admits to very little activity, very poor water intake.   BMI is Body mass index is 28.51 kg/m., she has not been working on diet and exercise. Wt Readings from Last 3 Encounters:  08/16/21 146 lb (66.2 kg)  06/21/21 145 lb (65.8 kg)  05/02/21 148 lb 3.2 oz (67.2 kg)   Her blood pressure has been controlled at home, today their BP is BP: 110/70 She does not workout. She denies chest pain, shortness of breath, dizziness.   She is not on cholesterol medication and denies myalgias. Her cholesterol is not at goal. The cholesterol last visit was:   Lab Results  Component Value Date   CHOL 232 (H) 05/02/2021   HDL 50 05/02/2021   LDLCALC 147 (H) 05/02/2021   TRIG 211 (H) 05/02/2021   CHOLHDL 4.6 05/02/2021   :  Lab Results  Component Value Date   HGBA1C 5.4 05/02/2021   She has CKD III monitored at this office. She Last GFR Lab Results  Component Value Date   GFRNONAA 52 (L) 05/02/2021   GFRNONAA 53 (L) 01/18/2021   GFRNONAA 63 07/20/2020    Patient is on Vitamin D supplement.   Lab Results  Component Value Date   VD25OH 76 05/02/2021       Medication Review: Current Outpatient Medications on File Prior to Visit  Medication Sig Dispense Refill   acetaminophen (TYLENOL) 500 MG tablet Take 1,000  mg by mouth 3 (three) times daily.     albuterol (PROVENTIL HFA;VENTOLIN HFA) 108 (90 Base) MCG/ACT inhaler Inhale 1-2 puffs into the lungs every 6 (six) hours as needed for wheezing or shortness of breath.     carbidopa-levodopa (SINEMET IR) 25-100 MG tablet TAKE 2 TABS AT 5AM, TAKE 1 TAB AT 7AM, TAKE 2 TABS AT 9AM, TAKE 1 TAB AT 11AM, TAKE 2 TABS AT 1PM, TAKE 1 TAB AT 3PM, TAKE 1 TAB AT 5PM (Patient taking differently: TAKE 2 TABS AT 5AM, TAKE 1 TAB AT 7AM, TAKE 2 TABS AT 9AM, TAKE 1 TAB AT 11AM, TAKE 2 TABS AT 1PM, TAKE 1 TAB AT 3PM, TAKE 1 TAB AT 5PM AND 1 TAB AT BEDTIME) 900 tablet 1   Cholecalciferol (VITAMIN D3) 5000 units CAPS Take 5,000 Units by mouth daily.     Cranberry-Vitamin C-Probiotic (AZO CRANBERRY) 250-30 MG TABS Take 1 tablet by mouth daily.     dicyclomine (BENTYL) 20 MG tablet Take  1/2 to 1 tablet   3 x /day  bedfore Meals for abdominal Cramping, Bloating or Diarrhea (Patient taking differently: as needed. Take  1/2 to 1 tablet   3 x /day  bedfore Meals for abdominal Cramping, Bloating or Diarrhea) 90 tablet 0   hydrochlorothiazide (HYDRODIURIL) 25 MG tablet Take  1 tablet     Daily      For Fluid Retention / Ankle Swelling 90 tablet 1   methocarbamol (ROBAXIN) 500 MG tablet Take by mouth.     midodrine (PROAMATINE) 10 MG tablet Take 1/2-1 tab thee times daily for low blood pressure. 180 tablet 1   NON FORMULARY Arthritis cream     nystatin (MYCOSTATIN/NYSTOP) powder Apply under breasts 2-3 times a day. 60 g 2   ondansetron (ZOFRAN-ODT) 4 MG disintegrating tablet Take 4 mg by mouth every 8 (eight) hours as needed for nausea or vomiting.     OVER THE COUNTER MEDICATION Takes Mirlax daily     phenazopyridine (PYRIDIUM) 200 MG tablet Take 1 tablet (200 mg total) by mouth 3 (three) times daily as needed for pain. 20 tablet 0   Probiotic Product (ALIGN) 4 MG CAPS Take 1 capsule by mouth daily.     sertraline (ZOLOFT) 100 MG tablet TAKE 1 & 1/2 TABLETS BY MOUTH AT NIGHT (Patient  taking differently: TAKE 1 TABLET BY MOUTH AT NIGHT) 120 tablet 3   vitamin B-12 (CYANOCOBALAMIN) 500 MCG tablet Take 500 mcg by mouth daily.     fluconazole (DIFLUCAN) 100 MG tablet Take  1 tablet  Daily  for 2 week for Yeast Infection 14 tablet 1   sulfamethoxazole-trimethoprim (BACTRIM DS) 800-160 MG tablet Take 1 tablet 2 x /day with Meals for Infection 20 tablet 0   No current facility-administered medications on file prior to visit.    Allergies: Allergies  Allergen Reactions   Lyrica [Pregabalin] Rash and Other (See Comments)    Weight gain and swelling in hands, legs and feet   Macrobid [Nitrofurantoin Macrocrystal] Swelling and Rash   Baclofen Other (See Comments)    Kept her awake, affected memory   Cephalosporins Other (See Comments)    Cannot sleep   Codeine Nausea Only    dizziness   Darvocet [Propoxyphene N-Acetaminophen] Nausea And Vomiting   Diclofenac Rash    The gel caused a rash   Levaquin [Levofloxacin] Hives   Penicillins Rash   Selegiline Hcl Rash   Tramadol Nausea And Vomiting   Triamcinolone Itching    Current Problems (verified) has Parkinson disease ([place]); RBD (REM behavioral disorder); Hyperlipidemia; Hypertension; GERD (gastroesophageal reflux disease); Abnormal glucose; Vitamin D deficiency; Medication management; SI (sacroiliac) pain; Chronic pain syndrome; Lumbar radiculopathy; Lumbar stenosis with neurogenic claudication; Positive colorectal cancer screening using Cologuard test; Frequent falls; S/P spinal fusion; Postlaminectomy syndrome, not elsewhere classified; Osteoporosis; and Stage 3a chronic kidney disease ([place]) on their problem list.  Screening Tests Immunization History  Administered Date(s) Administered   Influenza, High Dose Seasonal PF 09/08/2014, 10/04/2015, 09/02/2019, 08/30/2020   Influenza-Unspecified 09/29/2016, 08/08/2017, 08/22/2018   PFIZER(Purple Top)SARS-COV-2 Vaccination 12/22/2019, 01/23/2020   Pneumococcal Conjugate-13  11/10/2015   Pneumococcal Polysaccharide-23 02/26/2017   Pneumococcal-Unspecified 11/27/2006   Td 09/08/2013   Zoster, Live 06/27/2006    Preventative care:  Tetanus: 08/2013 Pneumonia: 2/2 Influenza: 08/2020, get in Oct Shingles - zoster 2007  Covid 19 2/2, 2021, pfizer + booster  Last colonoscopy: 2008 cologuard + 02/2017 saw Dr. Amedeo Plenty who didn't recommend colonoscopy, no sx other than constipation (chronic)  DEXA: 2019, osteoporosis, R forearm T-3.4, hip T -2.6, alendronate failed, declines further, would not pursue other treatment.  Mammogram: 09/2017, Dr. Ulanda Edison, declines further  Names of Other Physician/Practitioners you currently use: 1. [facility] here for primary care 2. Dr. Sabra Heck, eye doctor, last visit 2022, has reading glasses 3.  Dr. Kathryne Sharper, dentist, last visit 2022, goes q66m Patient Care Team: MUnk Pinto MD as PCP - General (Internal Medicine) HTeena Irani MD (Inactive) as Consulting Physician (Gastroenterology) KTroy Sine MD as Consulting Physician (Cardiology) SLyndal Pulley DO as Attending Physician (Family Medicine) [name], REustace Quail DO as Consulting Physician (Neurology) YMarybelle Killings MD as Consulting Physician (Orthopedic Surgery) Powers, DRosezella Florida MD as Referring Physician (Oral Surgery) ERush Landmark [facility] Pharmac[facility] (Pharmacist)  Surgical: She  has a past surgical history that includes Abdominal hysterectomy; Hernia repair; Rotator cuff repair (Left, 10-07-12); Bunionectomy (Bilateral, 11-11-3); Cardiac catheterization (Bilateral, 2013); left heart catheterization with coronary angiogram (N/A, 07/03/2012); Appendectomy; Colonoscopy; Back surgery (12/20/2015); Repair of cerebrospinal fluid leak (N/A, 12/24/2015); Back surgery (09/27/2017); and Back surgery (2020). Family Her family history includes CVA in her brother; Esophageal cancer in her mother; Heart attack in her mother; Hypercholesterolemia in  her brother; Hypertension in her brother; Mental retardation in her brother; Suicidality in her brother and father. Social history  She reports that she has never smoked. She has never used smokeless tobacco. She reports that she does not drink alcohol and does not use drugs.    MEDICARE WELLNESS OBJECTIVES: Physical activity: Current Exercise Habits: The patient does not participate in regular exercise at present, Exercise limited by: neurologic condition(s) Cardiac risk factors: Cardiac Risk Factors include: advanced age (>536m, >6>2omen);dyslipidemia;hypertension;sedentary lifestyle Depression/mood screen:   [facility]9 08/16/2021  Decreased Interest 0  Down, Depressed, Hopeless 1  PHQ - 2 Score 1  Altered sleeping -  Tired, decreased energy -  Change in appetite -  Feeling bad or failure about yourself  -  Trouble concentrating -  Moving slowly or fidgety/restless -  Suicidal thoughts -  PHQ-9 Score -  Difficult doing work/chores -  Some recent data might be hidden    ADLs:  In your present state of health, do you have any difficulty performing the following activities: 08/16/2021 05/01/2021  Hearing? N N  Vision? N N  Difficulty concentrating or making decisions? Y Y  Comment - limited insight & poor judgement  Walking or climbing stairs? Y N  Comment wheelchair -  Dressing or bathing? N N  Comment husband assists -  Doing errands, shopping? Y Tempie DonningComment husband drives husband transports  PrConservation officer, naturend eating ? Y -  Using the Toilet? Y -  In the past six months, have you accidently leaked urine? Y -  Comment uses pull ups -  Do you have problems with loss of bowel control? N -  Managing your Medications? Y -  Managing your Finances? Y -  Housekeeping or managing your Housekeeping? Y -  Some recent data might be hidden     Cognitive Testing  Alert? Yes  Normal Appearance?Yes  Oriented to person? Yes  Place? Yes   Time? Yes  Recall of three  objects?  Yes  Can perform simple calculations? Yes  Displays appropriate judgment?Yes  Can read the correct time from a watch face?Yes  EOL planning: Does Patient Have a Medical Advance Directive?: Yes Type of Advance Directive: Healthcare Power of Attorney, Living will Does patient want to make changes to medical advance directive?: No - Patient declined Copy of [place] Chart?: No - copy requested   Objective:   Today's Vitals   08/16/21 1352  BP: 110/70  Pulse: 91  Temp: 97.7 F (36.5 C)  SpO2: 98%  Weight: 146 lb (66.2 kg)  Body mass index is 28.51 kg/m.  General appearance: alert, no distress, WD/WN, female HEENT: normocephalic, sclerae anicteric, TMs pearly, nares patent, no discharge or erythema, pharynx normal, Oral cavity: MMM, no lesions Neck: supple, no lymphadenopathy, no thyromegaly, no masses Heart: RRR, normal S1, S2, no murmurs Lungs: CTA bilaterally, no wheezes, rhonchi, or rales Abdomen: +bs, rounded, soft, non tender, no palpable masses, no hepatomegaly, no splenomegaly Musculoskeletal: Obvious bony enlargement at MCP, DIP, PIP joints without erythema, heat, effusion, with mild ulnar deviation deformity bilaterally; Scoliosis, hunched over in wheelchair.  Skin: Free of any rashes, lesions, or ecchymosis.   Extremities: no edema, no cyanosis, no clubbing Pulses: 2+ symmetric, upper and lower extremities, normal cap refill Neurological: alert, oriented x 3, CN2-12 intact, strength decreased upper extremities and lower extremities, sensation normal throughout, has baseline tremor, mild mask facies. In wheelchair, gait not assessed.   Psychiatric: normal affect, behavior normal, pleasant    Medicare Attestation I have personally reviewed: The patient's medical and social history Their use of alcohol, tobacco or illicit drugs Their current medications and supplements The patient's functional ability including ADLs,fall risks, home  safety risks, cognitive, and hearing and visual impairment Diet and physical activities Evidence for depression or mood disorders  The patient's weight, height, BMI, and visual acuity have been recorded in the chart.  I have made referrals, counseling, and provided education to the patient based on review of the above and I have provided the patient with a written personalized care plan for preventive services.      Izora Ribas, NP   04/06/2020

## 2021-08-15 ENCOUNTER — Telehealth: Payer: Self-pay | Admitting: Pharmacist

## 2021-08-15 NOTE — Progress Notes (Signed)
Chronic Care Management Pharmacy Assistant   Name: Alexandra Lindsey  MRN: [id number] DOB: May 21, 1941   Reason for Encounter: Chart prep for initial CCM visit on 9/20   Conditions to be addressed/monitored: HTN, HLD, CKD Stage III, and Gastroesophageal Reflux Disease, Parkinson Disease, Osteoporosis,  REM Behavioral Disorder, Vitamin D Deficiency, Sacroiliac Pain, and Chronic Pain Syndrome  Recent office visits:  No recent PCP visits.   Recent consult visits:  06/21/21 Dr. Hall Busing (Neurology): follow up on Parkinson's disease. START Nuplazid '34mg'$  1 capsule daily. Follow up in 6 months. BP: 118/74 HR: 97  Hospital visits:  None in previous 6 months  Medications: Outpatient Encounter Medications as of 08/15/2021  Medication Sig Note   acetaminophen (TYLENOL) 500 MG tablet Take 1,000 mg by mouth 3 (three) times daily.    albuterol (PROVENTIL HFA;VENTOLIN HFA) 108 (90 Base) MCG/ACT inhaler Inhale 1-2 puffs into the lungs every 6 (six) hours as needed for wheezing or shortness of breath.    carbidopa-levodopa (SINEMET IR) 25-100 MG tablet TAKE 2 TABS AT 5AM, TAKE 1 TAB AT 7AM, TAKE 2 TABS AT 9AM, TAKE 1 TAB AT 11AM, TAKE 2 TABS AT 1PM, TAKE 1 TAB AT 3PM, TAKE 1 TAB AT 5PM (Patient taking differently: TAKE 2 TABS AT 5AM, TAKE 1 TAB AT 7AM, TAKE 2 TABS AT 9AM, TAKE 1 TAB AT 11AM, TAKE 2 TABS AT 1PM, TAKE 1 TAB AT 3PM, TAKE 1 TAB AT 5PM AND 1 TAB AT BEDTIME)    Cholecalciferol (VITAMIN D3) 5000 units CAPS Take 5,000 Units by mouth daily.    Cranberry-Vitamin C-Probiotic (AZO CRANBERRY) 250-30 MG TABS Take 1 tablet by mouth daily.    dicyclomine (BENTYL) 20 MG tablet Take  1/2 to 1 tablet   3 x /day  bedfore Meals for abdominal Cramping, Bloating or Diarrhea (Patient not taking: Reported on 06/21/2021)    fluconazole (DIFLUCAN) 100 MG tablet Take  1 tablet  Daily  for 2 week for Yeast Infection    hydrochlorothiazide (HYDRODIURIL) 25 MG tablet Take      1 tablet     Daily      For Fluid Retention /  Ankle Swelling 05/02/2021: Pt sates she takes this medication PRN.   methocarbamol (ROBAXIN) 500 MG tablet Take by mouth. 09/24/2019: prn   midodrine (PROAMATINE) 10 MG tablet Take 1/2-1 tab thee times daily for low blood pressure. 05/02/2021: Pt states she take the medication PRN.   NON FORMULARY Arthritis cream    nystatin (MYCOSTATIN/NYSTOP) powder Apply under breasts 2-3 times a day.    ondansetron (ZOFRAN-ODT) 4 MG disintegrating tablet Take 4 mg by mouth every 8 (eight) hours as needed for nausea or vomiting.    OVER THE COUNTER MEDICATION Takes Mirlax daily    phenazopyridine (PYRIDIUM) 200 MG tablet Take 1 tablet (200 mg total) by mouth 3 (three) times daily as needed for pain.    Probiotic Product (ALIGN) 4 MG CAPS Take 1 capsule by mouth daily.    sertraline (ZOLOFT) 100 MG tablet TAKE 1 & 1/2 TABLETS BY MOUTH AT NIGHT (Patient taking differently: TAKE 1 TABLET BY MOUTH AT NIGHT)    sulfamethoxazole-trimethoprim (BACTRIM DS) 800-160 MG tablet Take 1 tablet 2 x /day with Meals for Infection    vitamin B-12 (CYANOCOBALAMIN) 500 MCG tablet Take 500 mcg by mouth daily.    No facility-administered encounter medications on file as of 08/15/2021.    Care Gaps: Hepatitis C Screening Shingrix Vaccine COVID Vaccine FLU Vaccine  Star  Rating Drugs: No Star Rating Drugs.   Have you seen any other providers since your last visit? no Any changes in your medications or health? no Any side effects from any medications? no Do you have an symptoms or problems not managed by your medications? no Any concerns about your health right now? no Has your provider asked that you check blood pressure, blood sugar, or follow special diet at home? yes Do you get any type of exercise on a regular basis? no Can you think of a goal you would like to reach for your health? Managing our health. Do you have any problems getting your medications? no Is there anything that you would like to discuss during the  appointment? no  Please bring medications and supplements to appointment   Hayden CMA/[facility] [place]  [phone]

## 2021-08-16 ENCOUNTER — Encounter: Payer: Self-pay | Admitting: Adult Health

## 2021-08-16 ENCOUNTER — Ambulatory Visit (INDEPENDENT_AMBULATORY_CARE_PROVIDER_SITE_OTHER): Payer: Medicare Other | Admitting: Adult Health

## 2021-08-16 ENCOUNTER — Ambulatory Visit: Payer: Medicare Other | Admitting: Pharmacist

## 2021-08-16 ENCOUNTER — Other Ambulatory Visit: Payer: Self-pay

## 2021-08-16 VITALS — BP 110/70 | HR 91 | Temp 97.7°F | Wt 146.0 lb

## 2021-08-16 DIAGNOSIS — G4752 REM sleep behavior disorder: Secondary | ICD-10-CM

## 2021-08-16 DIAGNOSIS — G8929 Other chronic pain: Secondary | ICD-10-CM

## 2021-08-16 DIAGNOSIS — E782 Mixed hyperlipidemia: Secondary | ICD-10-CM | POA: Diagnosis not present

## 2021-08-16 DIAGNOSIS — I10 Essential (primary) hypertension: Secondary | ICD-10-CM | POA: Diagnosis not present

## 2021-08-16 DIAGNOSIS — M8000XS Age-related osteoporosis with current pathological fracture, unspecified site, sequela: Secondary | ICD-10-CM | POA: Diagnosis not present

## 2021-08-16 DIAGNOSIS — R6889 Other general symptoms and signs: Secondary | ICD-10-CM

## 2021-08-16 DIAGNOSIS — N1831 Chronic kidney disease, stage 3a: Secondary | ICD-10-CM | POA: Diagnosis not present

## 2021-08-16 DIAGNOSIS — G894 Chronic pain syndrome: Secondary | ICD-10-CM | POA: Diagnosis not present

## 2021-08-16 DIAGNOSIS — R195 Other fecal abnormalities: Secondary | ICD-10-CM

## 2021-08-16 DIAGNOSIS — Z0001 Encounter for general adult medical examination with abnormal findings: Secondary | ICD-10-CM

## 2021-08-16 DIAGNOSIS — G20 Parkinson's disease: Secondary | ICD-10-CM | POA: Diagnosis not present

## 2021-08-16 DIAGNOSIS — Z6828 Body mass index (BMI) 28.0-28.9, adult: Secondary | ICD-10-CM

## 2021-08-16 DIAGNOSIS — K5900 Constipation, unspecified: Secondary | ICD-10-CM

## 2021-08-16 DIAGNOSIS — Z79899 Other long term (current) drug therapy: Secondary | ICD-10-CM | POA: Diagnosis not present

## 2021-08-16 DIAGNOSIS — R7309 Other abnormal glucose: Secondary | ICD-10-CM | POA: Diagnosis not present

## 2021-08-16 DIAGNOSIS — K219 Gastro-esophageal reflux disease without esophagitis: Secondary | ICD-10-CM

## 2021-08-16 DIAGNOSIS — R296 Repeated falls: Secondary | ICD-10-CM | POA: Diagnosis not present

## 2021-08-16 DIAGNOSIS — E559 Vitamin D deficiency, unspecified: Secondary | ICD-10-CM

## 2021-08-16 DIAGNOSIS — M5416 Radiculopathy, lumbar region: Secondary | ICD-10-CM

## 2021-08-16 DIAGNOSIS — Z0000 Encounter for general adult medical examination without abnormal findings: Secondary | ICD-10-CM

## 2021-08-16 DIAGNOSIS — Z981 Arthrodesis status: Secondary | ICD-10-CM

## 2021-08-16 DIAGNOSIS — M545 Low back pain, unspecified: Secondary | ICD-10-CM

## 2021-08-16 MED ORDER — DICLOFENAC SODIUM 1 % EX GEL: 4.0000 g | Freq: Four times a day (QID) | CUTANEOUS | 1 refills | Status: DC

## 2021-08-16 NOTE — Patient Instructions (Addendum)
Work up to 3-4 bottles of water daily   Need to drink 2 glasses of water when you take miralax  Add sennakot - take daily       Constipation, Adult Constipation is when a person has trouble pooping (having a bowel movement). When you have this condition, you may poop fewer than 3 times a week. Your poop (stool) may also be dry, hard, or bigger than normal. Follow these instructions at home: Eating and drinking  Eat foods that have a lot of fiber, such as: Fresh fruits and vegetables. Whole grains. Beans. Eat less of foods that are low in fiber and high in fat and sugar, such as: [place] fries. Hamburgers. Cookies. Candy. Soda. Drink enough fluid to keep your pee (urine) pale yellow. General instructions Exercise regularly or as told by your doctor. Try to do 150 minutes of exercise each week. Go to the restroom when you feel like you need to poop. Do not hold it in. Take over-the-counter and prescription medicines only as told by your doctor. These include any fiber supplements. When you poop: Do deep breathing while relaxing your lower belly (abdomen). Relax your pelvic floor. The pelvic floor is a group of muscles that support the rectum, bladder, and intestines (as well as the uterus in women). Watch your condition for any changes. Tell your doctor if you notice any. Keep all follow-up visits as told by your doctor. This is important. Contact a doctor if: You have pain that gets worse. You have a fever. You have not pooped for 4 days. You vomit. You are not hungry. You lose weight. You are bleeding from the opening of the butt (anus). You have thin, pencil-like poop. Get help right away if: You have a fever, and your symptoms suddenly get worse. You leak poop or have blood in your poop. Your belly feels hard or bigger than normal (bloated). You have very bad belly pain. You feel dizzy or you faint. Summary Constipation is when a person poops fewer than 3 times a  week, has trouble pooping, or has poop that is dry, hard, or bigger than normal. Eat foods that have a lot of fiber. Drink enough fluid to keep your pee (urine) pale yellow. Take over-the-counter and prescription medicines only as told by your doctor. These include any fiber supplements. This information is not intended to replace advice given to you by your health care provider. Make sure you discuss any questions you have with your health care provider. Document Revised: 10/01/2019 Document Reviewed: 10/01/2019 Elsevier Patient Education  [place].

## 2021-08-17 LAB — COMPLETE METABOLIC PANEL WITH GFR
AG Ratio: 2.4 (calc) (ref 1.0–2.5)
ALT: 7 U/L (ref 6–29)
AST: 12 U/L (ref 10–35)
Albumin: 4.4 g/dL (ref 3.6–5.1)
Alkaline phosphatase (APISO): 100 U/L (ref 37–153)
BUN/Creatinine Ratio: 31 (calc) — ABNORMAL HIGH (ref 6–22)
BUN: 28 mg/dL — ABNORMAL HIGH (ref 7–25)
CO2: 26 mmol/L (ref 20–32)
Calcium: 10 mg/dL (ref 8.6–10.4)
Chloride: 107 mmol/L (ref 98–110)
Creat: 0.91 mg/dL (ref 0.60–1.00)
Globulin: 1.8 g/dL (calc) — ABNORMAL LOW (ref 1.9–3.7)
Glucose, Bld: 107 mg/dL — ABNORMAL HIGH (ref 65–99)
Potassium: 4.4 mmol/L (ref 3.5–5.3)
Sodium: 141 mmol/L (ref 135–146)
Total Bilirubin: 0.4 mg/dL (ref 0.2–1.2)
Total Protein: 6.2 g/dL (ref 6.1–8.1)
eGFR: 64 mL/min/{1.73_m2} (ref >=60)

## 2021-08-17 LAB — CBC WITH DIFFERENTIAL/PLATELET
Absolute Monocytes: 374 cells/uL (ref 200–950)
Basophils Absolute: 30 cells/uL (ref 0–200)
Basophils Relative: 0.7 %
Eosinophils Absolute: 60 cells/uL (ref 15–500)
Eosinophils Relative: 1.4 %
HCT: 37 % (ref 35.0–45.0)
Hemoglobin: 11.7 g/dL (ref 11.7–15.5)
Lymphs Abs: 1290 cells/uL (ref 850–3900)
MCH: 30.9 pg (ref 27.0–33.0)
MCHC: 31.6 g/dL — ABNORMAL LOW (ref 32.0–36.0)
MCV: 97.6 fL (ref 80.0–100.0)
MPV: 11.3 fL (ref 7.5–12.5)
Monocytes Relative: 8.7 %
Neutro Abs: 2546 cells/uL (ref 1500–7800)
Neutrophils Relative %: 59.2 %
Platelets: 245 10*3/uL (ref 140–400)
RBC: 3.79 10*6/uL — ABNORMAL LOW (ref 3.80–5.10)
RDW: 12.6 % (ref 11.0–15.0)
Total Lymphocyte: 30 %
WBC: 4.3 10*3/uL (ref 3.8–10.8)

## 2021-08-17 LAB — LIPID PANEL
Cholesterol: 207 mg/dL — ABNORMAL HIGH (ref <200)
HDL: 46 mg/dL — ABNORMAL LOW (ref >=50)
LDL Cholesterol (Calc): 122 mg/dL (calc) — ABNORMAL HIGH
Non-HDL Cholesterol (Calc): 161 mg/dL (calc) — ABNORMAL HIGH (ref <130)
Total CHOL/HDL Ratio: 4.5 (calc) (ref <5.0)
Triglycerides: 243 mg/dL — ABNORMAL HIGH (ref <150)

## 2021-08-17 LAB — MAGNESIUM: Magnesium: 2.2 mg/dL (ref 1.5–2.5)

## 2021-08-17 LAB — TSH: TSH: 1.47 mIU/L (ref 0.40–4.50)

## 2021-08-20 DIAGNOSIS — M48062 Spinal stenosis, lumbar region with neurogenic claudication: Secondary | ICD-10-CM | POA: Diagnosis not present

## 2021-08-20 DIAGNOSIS — N1831 Chronic kidney disease, stage 3a: Secondary | ICD-10-CM | POA: Diagnosis not present

## 2021-08-20 DIAGNOSIS — E559 Vitamin D deficiency, unspecified: Secondary | ICD-10-CM | POA: Diagnosis not present

## 2021-08-20 DIAGNOSIS — M8000XS Age-related osteoporosis with current pathological fracture, unspecified site, sequela: Secondary | ICD-10-CM | POA: Diagnosis not present

## 2021-08-20 DIAGNOSIS — G20 Parkinson's disease: Secondary | ICD-10-CM | POA: Diagnosis not present

## 2021-08-20 DIAGNOSIS — G4752 REM sleep behavior disorder: Secondary | ICD-10-CM | POA: Diagnosis not present

## 2021-08-20 DIAGNOSIS — K219 Gastro-esophageal reflux disease without esophagitis: Secondary | ICD-10-CM | POA: Diagnosis not present

## 2021-08-20 DIAGNOSIS — Z981 Arthrodesis status: Secondary | ICD-10-CM | POA: Diagnosis not present

## 2021-08-20 DIAGNOSIS — G894 Chronic pain syndrome: Secondary | ICD-10-CM | POA: Diagnosis not present

## 2021-08-20 DIAGNOSIS — M961 Postlaminectomy syndrome, not elsewhere classified: Secondary | ICD-10-CM | POA: Diagnosis not present

## 2021-08-20 DIAGNOSIS — R7309 Other abnormal glucose: Secondary | ICD-10-CM | POA: Diagnosis not present

## 2021-08-20 DIAGNOSIS — E782 Mixed hyperlipidemia: Secondary | ICD-10-CM | POA: Diagnosis not present

## 2021-08-20 DIAGNOSIS — M5116 Intervertebral disc disorders with radiculopathy, lumbar region: Secondary | ICD-10-CM | POA: Diagnosis not present

## 2021-08-20 DIAGNOSIS — Z993 Dependence on wheelchair: Secondary | ICD-10-CM | POA: Diagnosis not present

## 2021-08-20 DIAGNOSIS — K581 Irritable bowel syndrome with constipation: Secondary | ICD-10-CM | POA: Diagnosis not present

## 2021-08-20 DIAGNOSIS — R296 Repeated falls: Secondary | ICD-10-CM | POA: Diagnosis not present

## 2021-08-20 DIAGNOSIS — Z9181 History of falling: Secondary | ICD-10-CM | POA: Diagnosis not present

## 2021-08-20 DIAGNOSIS — R3981 Functional urinary incontinence: Secondary | ICD-10-CM | POA: Diagnosis not present

## 2021-08-20 DIAGNOSIS — I129 Hypertensive chronic kidney disease with stage 1 through stage 4 chronic kidney disease, or unspecified chronic kidney disease: Secondary | ICD-10-CM | POA: Diagnosis not present

## 2021-08-23 DIAGNOSIS — N1831 Chronic kidney disease, stage 3a: Secondary | ICD-10-CM | POA: Diagnosis not present

## 2021-08-23 DIAGNOSIS — G20 Parkinson's disease: Secondary | ICD-10-CM | POA: Diagnosis not present

## 2021-08-23 DIAGNOSIS — G894 Chronic pain syndrome: Secondary | ICD-10-CM | POA: Diagnosis not present

## 2021-08-23 DIAGNOSIS — M8000XS Age-related osteoporosis with current pathological fracture, unspecified site, sequela: Secondary | ICD-10-CM | POA: Diagnosis not present

## 2021-08-23 DIAGNOSIS — M961 Postlaminectomy syndrome, not elsewhere classified: Secondary | ICD-10-CM | POA: Diagnosis not present

## 2021-08-23 DIAGNOSIS — I129 Hypertensive chronic kidney disease with stage 1 through stage 4 chronic kidney disease, or unspecified chronic kidney disease: Secondary | ICD-10-CM | POA: Diagnosis not present

## 2021-08-25 DIAGNOSIS — M8000XS Age-related osteoporosis with current pathological fracture, unspecified site, sequela: Secondary | ICD-10-CM | POA: Diagnosis not present

## 2021-08-25 DIAGNOSIS — G894 Chronic pain syndrome: Secondary | ICD-10-CM | POA: Diagnosis not present

## 2021-08-25 DIAGNOSIS — I129 Hypertensive chronic kidney disease with stage 1 through stage 4 chronic kidney disease, or unspecified chronic kidney disease: Secondary | ICD-10-CM | POA: Diagnosis not present

## 2021-08-25 DIAGNOSIS — N1831 Chronic kidney disease, stage 3a: Secondary | ICD-10-CM | POA: Diagnosis not present

## 2021-08-25 DIAGNOSIS — G20 Parkinson's disease: Secondary | ICD-10-CM | POA: Diagnosis not present

## 2021-08-25 DIAGNOSIS — M961 Postlaminectomy syndrome, not elsewhere classified: Secondary | ICD-10-CM | POA: Diagnosis not present

## 2021-08-26 DIAGNOSIS — M8000XS Age-related osteoporosis with current pathological fracture, unspecified site, sequela: Secondary | ICD-10-CM | POA: Diagnosis not present

## 2021-08-26 DIAGNOSIS — G894 Chronic pain syndrome: Secondary | ICD-10-CM | POA: Diagnosis not present

## 2021-08-26 DIAGNOSIS — M961 Postlaminectomy syndrome, not elsewhere classified: Secondary | ICD-10-CM | POA: Diagnosis not present

## 2021-08-26 DIAGNOSIS — I10 Essential (primary) hypertension: Secondary | ICD-10-CM | POA: Diagnosis not present

## 2021-08-26 DIAGNOSIS — I129 Hypertensive chronic kidney disease with stage 1 through stage 4 chronic kidney disease, or unspecified chronic kidney disease: Secondary | ICD-10-CM | POA: Diagnosis not present

## 2021-08-26 DIAGNOSIS — G20 Parkinson's disease: Secondary | ICD-10-CM | POA: Diagnosis not present

## 2021-08-26 DIAGNOSIS — E785 Hyperlipidemia, unspecified: Secondary | ICD-10-CM | POA: Diagnosis not present

## 2021-08-26 DIAGNOSIS — N1831 Chronic kidney disease, stage 3a: Secondary | ICD-10-CM | POA: Diagnosis not present

## 2021-08-29 DIAGNOSIS — I129 Hypertensive chronic kidney disease with stage 1 through stage 4 chronic kidney disease, or unspecified chronic kidney disease: Secondary | ICD-10-CM | POA: Diagnosis not present

## 2021-08-29 DIAGNOSIS — G894 Chronic pain syndrome: Secondary | ICD-10-CM | POA: Diagnosis not present

## 2021-08-29 DIAGNOSIS — G20 Parkinson's disease: Secondary | ICD-10-CM | POA: Diagnosis not present

## 2021-08-29 DIAGNOSIS — M961 Postlaminectomy syndrome, not elsewhere classified: Secondary | ICD-10-CM | POA: Diagnosis not present

## 2021-08-29 DIAGNOSIS — M8000XS Age-related osteoporosis with current pathological fracture, unspecified site, sequela: Secondary | ICD-10-CM | POA: Diagnosis not present

## 2021-08-29 DIAGNOSIS — N1831 Chronic kidney disease, stage 3a: Secondary | ICD-10-CM | POA: Diagnosis not present

## 2021-08-31 DIAGNOSIS — M8000XS Age-related osteoporosis with current pathological fracture, unspecified site, sequela: Secondary | ICD-10-CM | POA: Diagnosis not present

## 2021-08-31 DIAGNOSIS — G894 Chronic pain syndrome: Secondary | ICD-10-CM | POA: Diagnosis not present

## 2021-08-31 DIAGNOSIS — N1831 Chronic kidney disease, stage 3a: Secondary | ICD-10-CM | POA: Diagnosis not present

## 2021-08-31 DIAGNOSIS — G20 Parkinson's disease: Secondary | ICD-10-CM | POA: Diagnosis not present

## 2021-08-31 DIAGNOSIS — M961 Postlaminectomy syndrome, not elsewhere classified: Secondary | ICD-10-CM | POA: Diagnosis not present

## 2021-08-31 DIAGNOSIS — I129 Hypertensive chronic kidney disease with stage 1 through stage 4 chronic kidney disease, or unspecified chronic kidney disease: Secondary | ICD-10-CM | POA: Diagnosis not present

## 2021-09-06 DIAGNOSIS — M8000XS Age-related osteoporosis with current pathological fracture, unspecified site, sequela: Secondary | ICD-10-CM | POA: Diagnosis not present

## 2021-09-06 DIAGNOSIS — I129 Hypertensive chronic kidney disease with stage 1 through stage 4 chronic kidney disease, or unspecified chronic kidney disease: Secondary | ICD-10-CM | POA: Diagnosis not present

## 2021-09-06 DIAGNOSIS — G894 Chronic pain syndrome: Secondary | ICD-10-CM | POA: Diagnosis not present

## 2021-09-06 DIAGNOSIS — M961 Postlaminectomy syndrome, not elsewhere classified: Secondary | ICD-10-CM | POA: Diagnosis not present

## 2021-09-06 DIAGNOSIS — G20 Parkinson's disease: Secondary | ICD-10-CM | POA: Diagnosis not present

## 2021-09-06 DIAGNOSIS — N1831 Chronic kidney disease, stage 3a: Secondary | ICD-10-CM | POA: Diagnosis not present

## 2021-09-08 DIAGNOSIS — M961 Postlaminectomy syndrome, not elsewhere classified: Secondary | ICD-10-CM | POA: Diagnosis not present

## 2021-09-08 DIAGNOSIS — G20 Parkinson's disease: Secondary | ICD-10-CM | POA: Diagnosis not present

## 2021-09-08 DIAGNOSIS — M8000XS Age-related osteoporosis with current pathological fracture, unspecified site, sequela: Secondary | ICD-10-CM | POA: Diagnosis not present

## 2021-09-08 DIAGNOSIS — I129 Hypertensive chronic kidney disease with stage 1 through stage 4 chronic kidney disease, or unspecified chronic kidney disease: Secondary | ICD-10-CM | POA: Diagnosis not present

## 2021-09-08 DIAGNOSIS — G894 Chronic pain syndrome: Secondary | ICD-10-CM | POA: Diagnosis not present

## 2021-09-08 DIAGNOSIS — N1831 Chronic kidney disease, stage 3a: Secondary | ICD-10-CM | POA: Diagnosis not present

## 2021-09-09 DIAGNOSIS — I129 Hypertensive chronic kidney disease with stage 1 through stage 4 chronic kidney disease, or unspecified chronic kidney disease: Secondary | ICD-10-CM | POA: Diagnosis not present

## 2021-09-09 DIAGNOSIS — M8000XS Age-related osteoporosis with current pathological fracture, unspecified site, sequela: Secondary | ICD-10-CM | POA: Diagnosis not present

## 2021-09-09 DIAGNOSIS — G20 Parkinson's disease: Secondary | ICD-10-CM | POA: Diagnosis not present

## 2021-09-09 DIAGNOSIS — G894 Chronic pain syndrome: Secondary | ICD-10-CM | POA: Diagnosis not present

## 2021-09-09 DIAGNOSIS — M961 Postlaminectomy syndrome, not elsewhere classified: Secondary | ICD-10-CM | POA: Diagnosis not present

## 2021-09-09 DIAGNOSIS — N1831 Chronic kidney disease, stage 3a: Secondary | ICD-10-CM | POA: Diagnosis not present

## 2021-09-12 DIAGNOSIS — N1831 Chronic kidney disease, stage 3a: Secondary | ICD-10-CM | POA: Diagnosis not present

## 2021-09-12 DIAGNOSIS — G20 Parkinson's disease: Secondary | ICD-10-CM | POA: Diagnosis not present

## 2021-09-12 DIAGNOSIS — M8000XS Age-related osteoporosis with current pathological fracture, unspecified site, sequela: Secondary | ICD-10-CM | POA: Diagnosis not present

## 2021-09-12 DIAGNOSIS — I129 Hypertensive chronic kidney disease with stage 1 through stage 4 chronic kidney disease, or unspecified chronic kidney disease: Secondary | ICD-10-CM | POA: Diagnosis not present

## 2021-09-12 DIAGNOSIS — G894 Chronic pain syndrome: Secondary | ICD-10-CM | POA: Diagnosis not present

## 2021-09-12 DIAGNOSIS — M961 Postlaminectomy syndrome, not elsewhere classified: Secondary | ICD-10-CM | POA: Diagnosis not present

## 2021-09-13 DIAGNOSIS — M961 Postlaminectomy syndrome, not elsewhere classified: Secondary | ICD-10-CM | POA: Diagnosis not present

## 2021-09-13 DIAGNOSIS — G20 Parkinson's disease: Secondary | ICD-10-CM | POA: Diagnosis not present

## 2021-09-13 DIAGNOSIS — M8000XS Age-related osteoporosis with current pathological fracture, unspecified site, sequela: Secondary | ICD-10-CM | POA: Diagnosis not present

## 2021-09-13 DIAGNOSIS — G894 Chronic pain syndrome: Secondary | ICD-10-CM | POA: Diagnosis not present

## 2021-09-13 DIAGNOSIS — N1831 Chronic kidney disease, stage 3a: Secondary | ICD-10-CM | POA: Diagnosis not present

## 2021-09-13 DIAGNOSIS — I129 Hypertensive chronic kidney disease with stage 1 through stage 4 chronic kidney disease, or unspecified chronic kidney disease: Secondary | ICD-10-CM | POA: Diagnosis not present

## 2021-09-16 DIAGNOSIS — Z23 Encounter for immunization: Secondary | ICD-10-CM | POA: Diagnosis not present

## 2021-09-19 DIAGNOSIS — N1831 Chronic kidney disease, stage 3a: Secondary | ICD-10-CM | POA: Diagnosis not present

## 2021-09-19 DIAGNOSIS — R296 Repeated falls: Secondary | ICD-10-CM | POA: Diagnosis not present

## 2021-09-19 DIAGNOSIS — K581 Irritable bowel syndrome with constipation: Secondary | ICD-10-CM | POA: Diagnosis not present

## 2021-09-19 DIAGNOSIS — K219 Gastro-esophageal reflux disease without esophagitis: Secondary | ICD-10-CM | POA: Diagnosis not present

## 2021-09-19 DIAGNOSIS — G4752 REM sleep behavior disorder: Secondary | ICD-10-CM | POA: Diagnosis not present

## 2021-09-19 DIAGNOSIS — M5116 Intervertebral disc disorders with radiculopathy, lumbar region: Secondary | ICD-10-CM | POA: Diagnosis not present

## 2021-09-19 DIAGNOSIS — E782 Mixed hyperlipidemia: Secondary | ICD-10-CM | POA: Diagnosis not present

## 2021-09-19 DIAGNOSIS — R7309 Other abnormal glucose: Secondary | ICD-10-CM | POA: Diagnosis not present

## 2021-09-19 DIAGNOSIS — M8000XS Age-related osteoporosis with current pathological fracture, unspecified site, sequela: Secondary | ICD-10-CM | POA: Diagnosis not present

## 2021-09-19 DIAGNOSIS — R3981 Functional urinary incontinence: Secondary | ICD-10-CM | POA: Diagnosis not present

## 2021-09-19 DIAGNOSIS — M961 Postlaminectomy syndrome, not elsewhere classified: Secondary | ICD-10-CM | POA: Diagnosis not present

## 2021-09-19 DIAGNOSIS — G20 Parkinson's disease: Secondary | ICD-10-CM | POA: Diagnosis not present

## 2021-09-19 DIAGNOSIS — Z993 Dependence on wheelchair: Secondary | ICD-10-CM | POA: Diagnosis not present

## 2021-09-19 DIAGNOSIS — G894 Chronic pain syndrome: Secondary | ICD-10-CM | POA: Diagnosis not present

## 2021-09-19 DIAGNOSIS — M48062 Spinal stenosis, lumbar region with neurogenic claudication: Secondary | ICD-10-CM | POA: Diagnosis not present

## 2021-09-19 DIAGNOSIS — I129 Hypertensive chronic kidney disease with stage 1 through stage 4 chronic kidney disease, or unspecified chronic kidney disease: Secondary | ICD-10-CM | POA: Diagnosis not present

## 2021-09-19 DIAGNOSIS — Z9181 History of falling: Secondary | ICD-10-CM | POA: Diagnosis not present

## 2021-09-19 DIAGNOSIS — Z981 Arthrodesis status: Secondary | ICD-10-CM | POA: Diagnosis not present

## 2021-09-19 DIAGNOSIS — E559 Vitamin D deficiency, unspecified: Secondary | ICD-10-CM | POA: Diagnosis not present

## 2021-09-21 DIAGNOSIS — M961 Postlaminectomy syndrome, not elsewhere classified: Secondary | ICD-10-CM | POA: Diagnosis not present

## 2021-09-21 DIAGNOSIS — I129 Hypertensive chronic kidney disease with stage 1 through stage 4 chronic kidney disease, or unspecified chronic kidney disease: Secondary | ICD-10-CM | POA: Diagnosis not present

## 2021-09-21 DIAGNOSIS — G894 Chronic pain syndrome: Secondary | ICD-10-CM | POA: Diagnosis not present

## 2021-09-21 DIAGNOSIS — G20 Parkinson's disease: Secondary | ICD-10-CM | POA: Diagnosis not present

## 2021-09-21 DIAGNOSIS — M8000XS Age-related osteoporosis with current pathological fracture, unspecified site, sequela: Secondary | ICD-10-CM | POA: Diagnosis not present

## 2021-09-21 DIAGNOSIS — N1831 Chronic kidney disease, stage 3a: Secondary | ICD-10-CM | POA: Diagnosis not present

## 2021-09-26 DIAGNOSIS — G894 Chronic pain syndrome: Secondary | ICD-10-CM | POA: Diagnosis not present

## 2021-09-26 DIAGNOSIS — M8000XS Age-related osteoporosis with current pathological fracture, unspecified site, sequela: Secondary | ICD-10-CM | POA: Diagnosis not present

## 2021-09-26 DIAGNOSIS — G20 Parkinson's disease: Secondary | ICD-10-CM | POA: Diagnosis not present

## 2021-09-26 DIAGNOSIS — I129 Hypertensive chronic kidney disease with stage 1 through stage 4 chronic kidney disease, or unspecified chronic kidney disease: Secondary | ICD-10-CM | POA: Diagnosis not present

## 2021-09-26 DIAGNOSIS — N1831 Chronic kidney disease, stage 3a: Secondary | ICD-10-CM | POA: Diagnosis not present

## 2021-09-26 DIAGNOSIS — M961 Postlaminectomy syndrome, not elsewhere classified: Secondary | ICD-10-CM | POA: Diagnosis not present

## 2021-09-27 ENCOUNTER — Other Ambulatory Visit: Payer: Self-pay | Admitting: Neurology

## 2021-09-27 DIAGNOSIS — G20 Parkinson's disease: Secondary | ICD-10-CM

## 2021-09-28 DIAGNOSIS — M961 Postlaminectomy syndrome, not elsewhere classified: Secondary | ICD-10-CM | POA: Diagnosis not present

## 2021-09-28 DIAGNOSIS — I129 Hypertensive chronic kidney disease with stage 1 through stage 4 chronic kidney disease, or unspecified chronic kidney disease: Secondary | ICD-10-CM | POA: Diagnosis not present

## 2021-09-28 DIAGNOSIS — G894 Chronic pain syndrome: Secondary | ICD-10-CM | POA: Diagnosis not present

## 2021-09-28 DIAGNOSIS — M8000XS Age-related osteoporosis with current pathological fracture, unspecified site, sequela: Secondary | ICD-10-CM | POA: Diagnosis not present

## 2021-09-28 DIAGNOSIS — N1831 Chronic kidney disease, stage 3a: Secondary | ICD-10-CM | POA: Diagnosis not present

## 2021-09-28 DIAGNOSIS — G20 Parkinson's disease: Secondary | ICD-10-CM | POA: Diagnosis not present

## 2021-10-03 DIAGNOSIS — M5451 Vertebrogenic low back pain: Secondary | ICD-10-CM | POA: Diagnosis not present

## 2021-10-03 DIAGNOSIS — Z981 Arthrodesis status: Secondary | ICD-10-CM | POA: Diagnosis not present

## 2021-10-03 DIAGNOSIS — M542 Cervicalgia: Secondary | ICD-10-CM | POA: Diagnosis not present

## 2021-10-04 DIAGNOSIS — M961 Postlaminectomy syndrome, not elsewhere classified: Secondary | ICD-10-CM | POA: Diagnosis not present

## 2021-10-04 DIAGNOSIS — M8000XS Age-related osteoporosis with current pathological fracture, unspecified site, sequela: Secondary | ICD-10-CM | POA: Diagnosis not present

## 2021-10-04 DIAGNOSIS — I129 Hypertensive chronic kidney disease with stage 1 through stage 4 chronic kidney disease, or unspecified chronic kidney disease: Secondary | ICD-10-CM | POA: Diagnosis not present

## 2021-10-04 DIAGNOSIS — N1831 Chronic kidney disease, stage 3a: Secondary | ICD-10-CM | POA: Diagnosis not present

## 2021-10-04 DIAGNOSIS — G894 Chronic pain syndrome: Secondary | ICD-10-CM | POA: Diagnosis not present

## 2021-10-04 DIAGNOSIS — G20 Parkinson's disease: Secondary | ICD-10-CM | POA: Diagnosis not present

## 2021-10-07 DIAGNOSIS — G894 Chronic pain syndrome: Secondary | ICD-10-CM | POA: Diagnosis not present

## 2021-10-07 DIAGNOSIS — M8000XS Age-related osteoporosis with current pathological fracture, unspecified site, sequela: Secondary | ICD-10-CM | POA: Diagnosis not present

## 2021-10-07 DIAGNOSIS — M961 Postlaminectomy syndrome, not elsewhere classified: Secondary | ICD-10-CM | POA: Diagnosis not present

## 2021-10-07 DIAGNOSIS — G20 Parkinson's disease: Secondary | ICD-10-CM | POA: Diagnosis not present

## 2021-10-07 DIAGNOSIS — N1831 Chronic kidney disease, stage 3a: Secondary | ICD-10-CM | POA: Diagnosis not present

## 2021-10-07 DIAGNOSIS — I129 Hypertensive chronic kidney disease with stage 1 through stage 4 chronic kidney disease, or unspecified chronic kidney disease: Secondary | ICD-10-CM | POA: Diagnosis not present

## 2021-10-07 DIAGNOSIS — Z20828 Contact with and (suspected) exposure to other viral communicable diseases: Secondary | ICD-10-CM | POA: Diagnosis not present

## 2021-10-10 DIAGNOSIS — N1831 Chronic kidney disease, stage 3a: Secondary | ICD-10-CM | POA: Diagnosis not present

## 2021-10-10 DIAGNOSIS — G20 Parkinson's disease: Secondary | ICD-10-CM | POA: Diagnosis not present

## 2021-10-10 DIAGNOSIS — M8000XS Age-related osteoporosis with current pathological fracture, unspecified site, sequela: Secondary | ICD-10-CM | POA: Diagnosis not present

## 2021-10-10 DIAGNOSIS — G894 Chronic pain syndrome: Secondary | ICD-10-CM | POA: Diagnosis not present

## 2021-10-10 DIAGNOSIS — I129 Hypertensive chronic kidney disease with stage 1 through stage 4 chronic kidney disease, or unspecified chronic kidney disease: Secondary | ICD-10-CM | POA: Diagnosis not present

## 2021-10-10 DIAGNOSIS — M961 Postlaminectomy syndrome, not elsewhere classified: Secondary | ICD-10-CM | POA: Diagnosis not present

## 2021-10-11 ENCOUNTER — Ambulatory Visit: Payer: Medicare Other | Admitting: Pharmacist

## 2021-10-12 DIAGNOSIS — G20 Parkinson's disease: Secondary | ICD-10-CM | POA: Diagnosis not present

## 2021-10-12 DIAGNOSIS — M8000XS Age-related osteoporosis with current pathological fracture, unspecified site, sequela: Secondary | ICD-10-CM | POA: Diagnosis not present

## 2021-10-12 DIAGNOSIS — I129 Hypertensive chronic kidney disease with stage 1 through stage 4 chronic kidney disease, or unspecified chronic kidney disease: Secondary | ICD-10-CM | POA: Diagnosis not present

## 2021-10-12 DIAGNOSIS — G894 Chronic pain syndrome: Secondary | ICD-10-CM | POA: Diagnosis not present

## 2021-10-12 DIAGNOSIS — M961 Postlaminectomy syndrome, not elsewhere classified: Secondary | ICD-10-CM | POA: Diagnosis not present

## 2021-10-12 DIAGNOSIS — N1831 Chronic kidney disease, stage 3a: Secondary | ICD-10-CM | POA: Diagnosis not present

## 2021-10-14 DIAGNOSIS — M961 Postlaminectomy syndrome, not elsewhere classified: Secondary | ICD-10-CM | POA: Diagnosis not present

## 2021-10-14 DIAGNOSIS — M8000XS Age-related osteoporosis with current pathological fracture, unspecified site, sequela: Secondary | ICD-10-CM | POA: Diagnosis not present

## 2021-10-14 DIAGNOSIS — N1831 Chronic kidney disease, stage 3a: Secondary | ICD-10-CM | POA: Diagnosis not present

## 2021-10-14 DIAGNOSIS — I129 Hypertensive chronic kidney disease with stage 1 through stage 4 chronic kidney disease, or unspecified chronic kidney disease: Secondary | ICD-10-CM | POA: Diagnosis not present

## 2021-10-14 DIAGNOSIS — G20 Parkinson's disease: Secondary | ICD-10-CM | POA: Diagnosis not present

## 2021-10-14 DIAGNOSIS — G894 Chronic pain syndrome: Secondary | ICD-10-CM | POA: Diagnosis not present

## 2021-10-17 DIAGNOSIS — I129 Hypertensive chronic kidney disease with stage 1 through stage 4 chronic kidney disease, or unspecified chronic kidney disease: Secondary | ICD-10-CM | POA: Diagnosis not present

## 2021-10-17 DIAGNOSIS — G894 Chronic pain syndrome: Secondary | ICD-10-CM | POA: Diagnosis not present

## 2021-10-17 DIAGNOSIS — M8000XS Age-related osteoporosis with current pathological fracture, unspecified site, sequela: Secondary | ICD-10-CM | POA: Diagnosis not present

## 2021-10-17 DIAGNOSIS — G20 Parkinson's disease: Secondary | ICD-10-CM | POA: Diagnosis not present

## 2021-10-17 DIAGNOSIS — M961 Postlaminectomy syndrome, not elsewhere classified: Secondary | ICD-10-CM | POA: Diagnosis not present

## 2021-10-17 DIAGNOSIS — N1831 Chronic kidney disease, stage 3a: Secondary | ICD-10-CM | POA: Diagnosis not present

## 2021-10-18 DIAGNOSIS — G894 Chronic pain syndrome: Secondary | ICD-10-CM | POA: Diagnosis not present

## 2021-10-18 DIAGNOSIS — I129 Hypertensive chronic kidney disease with stage 1 through stage 4 chronic kidney disease, or unspecified chronic kidney disease: Secondary | ICD-10-CM | POA: Diagnosis not present

## 2021-10-18 DIAGNOSIS — M8000XS Age-related osteoporosis with current pathological fracture, unspecified site, sequela: Secondary | ICD-10-CM | POA: Diagnosis not present

## 2021-10-18 DIAGNOSIS — N1831 Chronic kidney disease, stage 3a: Secondary | ICD-10-CM | POA: Diagnosis not present

## 2021-10-18 DIAGNOSIS — M961 Postlaminectomy syndrome, not elsewhere classified: Secondary | ICD-10-CM | POA: Diagnosis not present

## 2021-10-18 DIAGNOSIS — G20 Parkinson's disease: Secondary | ICD-10-CM | POA: Diagnosis not present

## 2021-10-19 DIAGNOSIS — R296 Repeated falls: Secondary | ICD-10-CM | POA: Diagnosis not present

## 2021-10-19 DIAGNOSIS — M5116 Intervertebral disc disorders with radiculopathy, lumbar region: Secondary | ICD-10-CM | POA: Diagnosis not present

## 2021-10-19 DIAGNOSIS — Z981 Arthrodesis status: Secondary | ICD-10-CM | POA: Diagnosis not present

## 2021-10-19 DIAGNOSIS — M961 Postlaminectomy syndrome, not elsewhere classified: Secondary | ICD-10-CM | POA: Diagnosis not present

## 2021-10-19 DIAGNOSIS — G4752 REM sleep behavior disorder: Secondary | ICD-10-CM | POA: Diagnosis not present

## 2021-10-19 DIAGNOSIS — K581 Irritable bowel syndrome with constipation: Secondary | ICD-10-CM | POA: Diagnosis not present

## 2021-10-19 DIAGNOSIS — E559 Vitamin D deficiency, unspecified: Secondary | ICD-10-CM | POA: Diagnosis not present

## 2021-10-19 DIAGNOSIS — R3981 Functional urinary incontinence: Secondary | ICD-10-CM | POA: Diagnosis not present

## 2021-10-19 DIAGNOSIS — M8000XS Age-related osteoporosis with current pathological fracture, unspecified site, sequela: Secondary | ICD-10-CM | POA: Diagnosis not present

## 2021-10-19 DIAGNOSIS — Z9181 History of falling: Secondary | ICD-10-CM | POA: Diagnosis not present

## 2021-10-19 DIAGNOSIS — I130 Hypertensive heart and chronic kidney disease with heart failure and stage 1 through stage 4 chronic kidney disease, or unspecified chronic kidney disease: Secondary | ICD-10-CM | POA: Diagnosis not present

## 2021-10-19 DIAGNOSIS — E782 Mixed hyperlipidemia: Secondary | ICD-10-CM | POA: Diagnosis not present

## 2021-10-19 DIAGNOSIS — I509 Heart failure, unspecified: Secondary | ICD-10-CM | POA: Diagnosis not present

## 2021-10-19 DIAGNOSIS — Z7951 Long term (current) use of inhaled steroids: Secondary | ICD-10-CM | POA: Diagnosis not present

## 2021-10-19 DIAGNOSIS — M48062 Spinal stenosis, lumbar region with neurogenic claudication: Secondary | ICD-10-CM | POA: Diagnosis not present

## 2021-10-19 DIAGNOSIS — N1831 Chronic kidney disease, stage 3a: Secondary | ICD-10-CM | POA: Diagnosis not present

## 2021-10-19 DIAGNOSIS — R7309 Other abnormal glucose: Secondary | ICD-10-CM | POA: Diagnosis not present

## 2021-10-19 DIAGNOSIS — G20 Parkinson's disease: Secondary | ICD-10-CM | POA: Diagnosis not present

## 2021-10-19 DIAGNOSIS — G894 Chronic pain syndrome: Secondary | ICD-10-CM | POA: Diagnosis not present

## 2021-10-19 DIAGNOSIS — K219 Gastro-esophageal reflux disease without esophagitis: Secondary | ICD-10-CM | POA: Diagnosis not present

## 2021-10-19 DIAGNOSIS — Z993 Dependence on wheelchair: Secondary | ICD-10-CM | POA: Diagnosis not present

## 2021-10-25 DIAGNOSIS — G20 Parkinson's disease: Secondary | ICD-10-CM | POA: Diagnosis not present

## 2021-10-25 DIAGNOSIS — I130 Hypertensive heart and chronic kidney disease with heart failure and stage 1 through stage 4 chronic kidney disease, or unspecified chronic kidney disease: Secondary | ICD-10-CM | POA: Diagnosis not present

## 2021-10-25 DIAGNOSIS — I509 Heart failure, unspecified: Secondary | ICD-10-CM | POA: Diagnosis not present

## 2021-10-25 DIAGNOSIS — N1831 Chronic kidney disease, stage 3a: Secondary | ICD-10-CM | POA: Diagnosis not present

## 2021-10-25 DIAGNOSIS — G894 Chronic pain syndrome: Secondary | ICD-10-CM | POA: Diagnosis not present

## 2021-10-25 DIAGNOSIS — M8000XS Age-related osteoporosis with current pathological fracture, unspecified site, sequela: Secondary | ICD-10-CM | POA: Diagnosis not present

## 2021-10-27 ENCOUNTER — Encounter: Payer: Self-pay | Admitting: Internal Medicine

## 2021-10-27 DIAGNOSIS — I509 Heart failure, unspecified: Secondary | ICD-10-CM | POA: Diagnosis not present

## 2021-10-27 DIAGNOSIS — N1831 Chronic kidney disease, stage 3a: Secondary | ICD-10-CM | POA: Diagnosis not present

## 2021-10-27 DIAGNOSIS — I130 Hypertensive heart and chronic kidney disease with heart failure and stage 1 through stage 4 chronic kidney disease, or unspecified chronic kidney disease: Secondary | ICD-10-CM | POA: Diagnosis not present

## 2021-10-27 DIAGNOSIS — M8000XS Age-related osteoporosis with current pathological fracture, unspecified site, sequela: Secondary | ICD-10-CM | POA: Diagnosis not present

## 2021-10-27 DIAGNOSIS — G20 Parkinson's disease: Secondary | ICD-10-CM | POA: Diagnosis not present

## 2021-10-27 DIAGNOSIS — G894 Chronic pain syndrome: Secondary | ICD-10-CM | POA: Diagnosis not present

## 2021-11-01 DIAGNOSIS — N1831 Chronic kidney disease, stage 3a: Secondary | ICD-10-CM | POA: Diagnosis not present

## 2021-11-01 DIAGNOSIS — I509 Heart failure, unspecified: Secondary | ICD-10-CM | POA: Diagnosis not present

## 2021-11-01 DIAGNOSIS — I130 Hypertensive heart and chronic kidney disease with heart failure and stage 1 through stage 4 chronic kidney disease, or unspecified chronic kidney disease: Secondary | ICD-10-CM | POA: Diagnosis not present

## 2021-11-01 DIAGNOSIS — M8000XS Age-related osteoporosis with current pathological fracture, unspecified site, sequela: Secondary | ICD-10-CM | POA: Diagnosis not present

## 2021-11-01 DIAGNOSIS — G20 Parkinson's disease: Secondary | ICD-10-CM | POA: Diagnosis not present

## 2021-11-01 DIAGNOSIS — G894 Chronic pain syndrome: Secondary | ICD-10-CM | POA: Diagnosis not present

## 2021-11-04 DIAGNOSIS — I509 Heart failure, unspecified: Secondary | ICD-10-CM | POA: Diagnosis not present

## 2021-11-04 DIAGNOSIS — G894 Chronic pain syndrome: Secondary | ICD-10-CM | POA: Diagnosis not present

## 2021-11-04 DIAGNOSIS — G20 Parkinson's disease: Secondary | ICD-10-CM | POA: Diagnosis not present

## 2021-11-04 DIAGNOSIS — M8000XS Age-related osteoporosis with current pathological fracture, unspecified site, sequela: Secondary | ICD-10-CM | POA: Diagnosis not present

## 2021-11-04 DIAGNOSIS — N1831 Chronic kidney disease, stage 3a: Secondary | ICD-10-CM | POA: Diagnosis not present

## 2021-11-04 DIAGNOSIS — I130 Hypertensive heart and chronic kidney disease with heart failure and stage 1 through stage 4 chronic kidney disease, or unspecified chronic kidney disease: Secondary | ICD-10-CM | POA: Diagnosis not present

## 2021-11-07 DIAGNOSIS — M8000XS Age-related osteoporosis with current pathological fracture, unspecified site, sequela: Secondary | ICD-10-CM | POA: Diagnosis not present

## 2021-11-07 DIAGNOSIS — N1831 Chronic kidney disease, stage 3a: Secondary | ICD-10-CM | POA: Diagnosis not present

## 2021-11-07 DIAGNOSIS — I130 Hypertensive heart and chronic kidney disease with heart failure and stage 1 through stage 4 chronic kidney disease, or unspecified chronic kidney disease: Secondary | ICD-10-CM | POA: Diagnosis not present

## 2021-11-07 DIAGNOSIS — I509 Heart failure, unspecified: Secondary | ICD-10-CM | POA: Diagnosis not present

## 2021-11-07 DIAGNOSIS — G894 Chronic pain syndrome: Secondary | ICD-10-CM | POA: Diagnosis not present

## 2021-11-07 DIAGNOSIS — G20 Parkinson's disease: Secondary | ICD-10-CM | POA: Diagnosis not present

## 2021-11-07 NOTE — Progress Notes (Signed)
Future Appointments  Date Time Provider Department  11/08/2021         6 month  10:30 AM Unk Pinto, MD [facility]  11/10/2021  1:30 PM Newton Pigg, [facility]  12/02/2021  2:00 PM [name], Eustace Quail, DO [facility]  05/02/2022            CPE   2:00 PM Unk Pinto, MD [facility]  08/16/2022          Wellness  3:30 PM Liane Comber, NP [facility]    History of Present Illness:       This very nice 80 y.o. MWF presents for 6  month follow up with HTN, HLD, Pre-Diabetes,, Chronic LBP, Parkinson's Dz and Vitamin D Deficiency.        Patient has Chronic LBP consequent of DJD/DDD and 3 back surgeries. She also has advanced Parkinson's Dz & id followed by Dr [name]. Patient has very unstable gait with high fall risk and has hx/o multiple falls and refuses to comply with recommendations to use her wheelchair.         Patient is treated for HTN  (2005) & BP has been controlled at home. Today's BP: is slightly low  -  97/70.  In 2013, she had a negative Heart Cath. Patient has had no complaints of any cardiac type chest pain, palpitations, dyspnea / orthopnea / PND, dizziness, claudication, or dependent edema.       Hyperlipidemia is not controlled with diet . Last Lipids were not at goal :  Lab Results  Component Value Date   CHOL 207 (H) 08/16/2021   HDL 46 (L) 08/16/2021   LDLCALC 122 (H) 08/16/2021   TRIG 243 (H) 08/16/2021   CHOLHDL 4.5 08/16/2021     Also, the patient has history of PreDiabetes (A1c 6.3% /2011 and 5.9%/2013) and has had no symptoms of reactive hypoglycemia, diabetic polys, paresthesias or visual blurring.  Last A1c was at goal :  Lab Results  Component Value Date   HGBA1C 5.4 05/02/2021                                                         Further, the patient also has history of Vitamin D Deficiency ("23" /2008) and supplements vitamin D without any suspected side-effects. Last vitamin D was at goal :  Lab Results  Component Value Date   VD25OH  76 05/02/2021     Current Outpatient Medications on File Prior to Visit  Medication Sig   acetaminophen 500 MG tablet Take 1,000 mg 3  times daily.   albuterol HFA inhaler Inhale 1-2 puffs  every 6 hours as needed    SINEMET IR 25-100 MG tablet TAKE 2 TABS AT 5AM, TAKE 1 TAB AT 7AM, TAKE 2 TABS AT 9AM, TAKE 1 TAB AT 11AM, TAKE 2 TABS AT 1PM, TAKE 1 TAB AT 3PM, TAKE 1 TAB AT 5PM AND 1 TAB AT BEDTIME   VITAMIN D 5000 u Take  daily.   Cranberry-Vitamin C-Probiotic (AZO CRANBERRY) 250-30 MG TABS Take 1 tablet daily.   diclofenac 1 % GEL Apply 4g 4 times daily.   dicyclomine 20 MG tablet Take  1/2 to 1 tablet 3 x /day  as needed.    hydrochlorothiazide 25 MG tablet Take      1 tablet  Daily         methocarbamol 500 MG tablet Take as needed    midodrine (PROAMATINE) 10 MG tablet Take 1/2-1 tab thee times daily for low blood pressure.   Arthritis cream    nystatin  powder Apply under breasts 2-3 times a day.   ondansetron -ODT  4 MG  Take every 8 hours as needed for nausea    Mirlax  Takes daily   phenazopyridine (PYRIDIUM) 200 MG tablet Take 1 tablet 3  times daily as needed for pain.   Probiotic Product (ALIGN) 4 MG CAPS Take 1 capsule daily.   sertraline (ZOLOFT) 100 MG tablet TAKE 1 AT NIGHT    vitamin B-12 500 MCG tablet Take  daily.      Allergies  Allergen Reactions   Lyrica [Pregabalin] Rash and Other (See Comments)    Weight gain and swelling in hands, legs and feet   Macrobid [Nitrofurantoin Macrocrystal] Swelling and Rash   Baclofen Other (See Comments)    Kept her awake, affected memory   Cephalosporins Other (See Comments)    Cannot sleep   Codeine Nausea Only    dizziness   Darvocet [Propoxyphene N-Acetaminophen] Nausea And Vomiting   Diclofenac Rash    The gel caused a rash   Levaquin [Levofloxacin] Hives   Penicillins Rash   Selegiline Hcl Rash   Tramadol Nausea And Vomiting   Triamcinolone Itching     PMHx:   Past Medical History:  Diagnosis Date    Anemia    as young girl and teenager   Arthritis    Chest pain, unspecified    Constipation    Costochondritis    GERD (gastroesophageal reflux disease)    Headache    ocular migraines   History of hiatal hernia    Hyperlipidemia 10-07-12   Never smoked tobacco    Parkinson's disease ([place]) 10-07-12   Parkinson's disease ([place])    Prediabetes    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   Spinal stenosis      Immunization History  Administered Date(s) Administered   Influenza, High Dose Seasonal PF 09/08/2014, 10/04/2015, 09/02/2019, 08/30/2020   Influenza-Unspecified 09/29/2016, 08/08/2017, 08/22/2018   PFIZER(Purple Top)SARS-COV-2 Vaccination 12/22/2019, 01/23/2020, 09/28/2020   Pneumococcal Conjugate-13 11/10/2015   Pneumococcal Polysaccharide-23 02/26/2017   Pneumococcal-Unspecified 11/27/2006   Td 09/08/2013   Zoster, Live 06/27/2006     Past Surgical History:  Procedure Laterality Date   ABDOMINAL HYSTERECTOMY     APPENDECTOMY     BACK SURGERY  12/20/2015   BACK SURGERY  09/27/2017   BACK SURGERY  2020   BUNIONECTOMY Bilateral 11-11-3   CARDIAC CATHETERIZATION Bilateral 2013   COLONOSCOPY     HERNIA REPAIR     LEFT HEART CATHETERIZATION WITH CORONARY ANGIOGRAM N/A 07/03/2012   Procedure: LEFT HEART CATHETERIZATION WITH CORONARY ANGIOGRAM;  Surgeon: Troy Sine, MD;  Location: [facility] CATH LAB;  Service: Cardiovascular;  Laterality: N/A;   REPAIR OF CEREBROSPINAL FLUID LEAK N/A 12/24/2015   Procedure: LUMBAR WOUND EXPLORATION, REPAIR OF CEREBROSPINAL FLUID LEAK, PLACEMENT OF LUMBAR DRAIN;  Surgeon: Kevan Ny Ditty, MD;  Location: [place] [facility]URO ORS;  Service: Neurosurgery;  Laterality: N/A;   ROTATOR CUFF REPAIR Left 10-07-12    FHx:    Reviewed / unchanged  SHx:    Reviewed / unchanged   Systems Review:  Constitutional: Denies fever, chills, wt changes, headaches, insomnia, fatigue, night sweats, change in appetite. Eyes: Denies redness, blurred vision, diplopia,  discharge, itchy, watery eyes.  ENT: Denies  discharge, congestion, post nasal drip, epistaxis, sore throat, earache, hearing loss, dental pain, tinnitus, vertigo, sinus pain, snoring.  CV: Denies chest pain, palpitations, irregular heartbeat, syncope, dyspnea, diaphoresis, orthopnea, PND, claudication or edema. Respiratory: denies cough, dyspnea, DOE, pleurisy, hoarseness, laryngitis, wheezing.  Gastrointestinal: Denies dysphagia, odynophagia, heartburn, reflux, water brash, abdominal pain or cramps, nausea, vomiting, bloating, diarrhea, constipation, hematemesis, melena, hematochezia  or hemorrhoids. Genitourinary: Denies dysuria, frequency, urgency, nocturia, hesitancy, discharge, hematuria or flank pain. Musculoskeletal: Denies arthralgias, myalgias, stiffness, jt. swelling, pain, limping or strain/sprain.  Skin: Denies pruritus, rash, hives, warts, acne, eczema or change in skin lesion(s). Neuro: No weakness, tremor, incoordination, spasms, paresthesia or pain. Psychiatric: Denies confusion, memory loss or sensory loss. Endo: Denies change in weight, skin or hair change.  Heme/Lymph: No excessive bleeding, bruising or enlarged lymph nodes.  Physical Exam  BP 97/70   Pulse 91   Temp 97.8 F (36.6 C)   Resp 17   Ht 5' (1.524 m)   Wt 142 lb (64.4 kg)   SpO2 96%   BMI 27.73 kg/m   Appears  well nourished, well groomed  and in no distress.  Eyes: PERRLA, EOMs, conjunctiva no swelling or erythema. Sinuses: No frontal/maxillary tenderness ENT/Mouth: EAC's clear, TM's nl w/o erythema, bulging. Nares clear w/o erythema, swelling, exudates. Oropharynx clear without erythema or exudates. Oral hygiene is good. Tongue normal, non obstructing. Hearing intact.  Neck: Supple. Thyroid not palpable. Car 2+/2+ without bruits, nodes or JVD. Chest: Respirations nl with BS clear & equal w/o rales, rhonchi, wheezing or stridor.  Cor: Heart sounds normal w/ regular rate and rhythm without sig. murmurs,  gallops, clicks or rubs. Peripheral pulses normal and equal  without edema.  Abdomen: Soft & bowel sounds normal. Non-tender w/o guarding, rebound, hernias, masses or organomegaly.  Lymphatics: Unremarkable.  Musculoskeletal: Full ROM all peripheral extremities, joint stability, 5/5 strength and normal gait.  Skin: Warm, dry without exposed rashes, lesions or ecchymosis apparent.  Neuro: Cranial nerves intact, reflexes equal bilaterally. Sensory-motor testing grossly intact. Tendon reflexes grossly intact.  Pysch: Alert & oriented x 3.  Insight and judgement nl & appropriate. No ideations.  Assessment and Plan:  1. Labile hypertension  - Continue medication, monitor blood pressure at home.  - Continue DASH diet.  Reminder to go to the ER if any CP,  SOB, nausea, dizziness, severe HA, changes vision/speech.   - CBC with Differential/Platelet - COMPLETE METABOLIC PANEL WITH GFR - Magnesium - TSH  2. Idiopathic hypotension  - CBC with Differential/Platelet - COMPLETE METABOLIC PANEL WITH GFR - Magnesium  3. Hyperlipidemia, mixed  - Continue diet/meds, exercise,& lifestyle modifications.  - Continue monitor periodic cholesterol/liver & renal functions    - Continue diet, exercise  - Lifestyle modifications.  - Monitor appropriate labs   - Lipid panel - TSH  4. Abnormal glucose  - Continue supplementation   - Hemoglobin A1c - Insulin, random  5. Vitamin D deficiency  - VITAMIN D 25 Hydroxy   6. Parkinson disease ([place])   7. Unstable gait   8. At high risk for falls   9. Medication management  - CBC with Differential/Platelet - COMPLETE METABOLIC PANEL WITH GFR - Magnesium - Lipid panel - TSH - Hemoglobin A1c - Insulin, random - VITAMIN D 25 Hydroxy          Discussed  regular exercise, BP monitoring, weight control to achieve/maintain BMI less than 25 and discussed med and SE's. Recommended labs to assess and monitor clinical status with  further  disposition pending results of labs.  I discussed the assessment and treatment plan with the patient. The patient was provided an opportunity to ask questions and all were answered. The patient agreed with the plan and demonstrated an understanding of the instructions.  I provided over 30 minutes of exam, counseling, chart review and  complex critical decision making.        The patient was advised to call back or seek an in-person evaluation if the symptoms worsen or if the condition fails to improve as anticipated.   Kirtland Bouchard, MD

## 2021-11-08 ENCOUNTER — Other Ambulatory Visit: Payer: Self-pay

## 2021-11-08 ENCOUNTER — Encounter: Payer: Self-pay | Admitting: Internal Medicine

## 2021-11-08 ENCOUNTER — Ambulatory Visit (INDEPENDENT_AMBULATORY_CARE_PROVIDER_SITE_OTHER): Payer: Medicare Other | Admitting: Internal Medicine

## 2021-11-08 VITALS — BP 97/70 | HR 91 | Temp 97.8°F | Resp 17 | Ht 60.0 in | Wt 142.0 lb

## 2021-11-08 DIAGNOSIS — Z9181 History of falling: Secondary | ICD-10-CM

## 2021-11-08 DIAGNOSIS — I950 Idiopathic hypotension: Secondary | ICD-10-CM | POA: Diagnosis not present

## 2021-11-08 DIAGNOSIS — E559 Vitamin D deficiency, unspecified: Secondary | ICD-10-CM | POA: Diagnosis not present

## 2021-11-08 DIAGNOSIS — E782 Mixed hyperlipidemia: Secondary | ICD-10-CM

## 2021-11-08 DIAGNOSIS — Z79899 Other long term (current) drug therapy: Secondary | ICD-10-CM | POA: Diagnosis not present

## 2021-11-08 DIAGNOSIS — G20 Parkinson's disease: Secondary | ICD-10-CM | POA: Diagnosis not present

## 2021-11-08 DIAGNOSIS — R7309 Other abnormal glucose: Secondary | ICD-10-CM

## 2021-11-08 DIAGNOSIS — R0989 Other specified symptoms and signs involving the circulatory and respiratory systems: Secondary | ICD-10-CM | POA: Diagnosis not present

## 2021-11-08 DIAGNOSIS — R2681 Unsteadiness on feet: Secondary | ICD-10-CM | POA: Diagnosis not present

## 2021-11-08 NOTE — Patient Instructions (Signed)
Due to recent changes in healthcare laws, you may see the results of your imaging and laboratory studies on MyChart before your provider has had a chance to review them.  We understand that in some cases there may be results that are confusing or concerning to you. Not all laboratory results come back in the same time frame and the provider may be waiting for multiple results in order to interpret others.  Please give us 48 hours in order for your provider to thoroughly review all the results before contacting the office for clarification of your results.  °++++++++++++++++++++++++++++++++++ ° Vit D  & °Vit C 1,000 mg   °are recommended to help protect  °against the Covid-19 and other Corona viruses.  ° ° Also it's recommended  °to take  °Zinc 50 mg  °to help  °protect against the Covid-19   °and best place to get ° is also on [employer]com  °and don't pay more than 6-8 cents /pill !  ° °===================================== °Coronavirus (COVID-19) Are you at risk? ° °Are you at risk for the Coronavirus (COVID-19)? ° °To be considered HIGH RISK for Coronavirus (COVID-19), you have to meet the following criteria: ° °Traveled to [place], [place], [place], [place] or [place]; or in the [place] to [place], [place], [place]  °or [place]; and have fever, cough, and shortness of breath within the last 2 weeks of travel OR °Been in close contact with a person diagnosed with COVID-19 within the last 2 weeks and have  °fever, cough,and shortness of breath ° °IF YOU DO NOT MEET THESE CRITERIA, YOU ARE CONSIDERED LOW RISK FOR COVID-19. ° °What to do if you are HIGH RISK for COVID-19? ° °If you are having a medical emergency, call 911. °Seek medical care right away. Before you go to a doctor’s office, urgent care or emergency department, ° call ahead and tell them about your recent travel, contact with someone diagnosed with COVID-19  ° and your symptoms.  °You should receive instructions from your physician’s office  regarding next steps of care.  °When you arrive at healthcare provider, tell the healthcare staff immediately you have returned from  °visiting [place], [place], [place], [place] or [place]; or traveled in the [place] to [place], [place],  °[place] or [place] in the last two weeks or you have been in close contact with a person diagnosed with  °COVID-19 in the last 2 weeks.   °Tell the health care staff about your symptoms: fever, cough and shortness of breath. °After you have been seen by a medical provider, you will be either: °Tested for (COVID-19) and discharged home on quarantine except to seek medical care if  °symptoms worsen, and asked to  °Stay home and avoid contact with others until you get your results (4-5 days)  °Avoid travel on public transportation if possible (such as bus, train, or airplane) or °Sent to the Emergency Department by EMS for evaluation, COVID-19 testing  and  °possible admission depending on your condition and test results. ° °What to do if you are LOW RISK for COVID-19? ° °Reduce your risk of any infection by using the same precautions used for avoiding the common cold or flu:  °Wash your hands often with soap and warm water for at least 20 seconds.  If soap and water are not readily available,  °use an alcohol-based hand sanitizer with at least 60% alcohol.  °If coughing or sneezing, cover your mouth and nose by coughing   or sneezing into the elbow areas of your shirt or coat, ° into a tissue or into your sleeve (not your hands). °Avoid shaking hands with others and consider head nods or verbal greetings only. °Avoid touching your eyes, nose, or mouth with unwashed hands.  °Avoid close contact with people who are sick. °Avoid places or events with large numbers of people in one location, like concerts or sporting events. °Carefully consider travel plans you have or are making. °If you are planning any travel outside or inside the [place], visit the CDC’s Travelers’ Health  webpage for the latest health notices. °If you have some symptoms but not all symptoms, continue to monitor at home and seek medical attention  °if your symptoms worsen. °If you are having a medical emergency, call 911. ° ° °++++++++++++++++++++++++++++++++ °Recommend Adult Low Dose Aspirin or  °coated  Aspirin 81 mg daily  °To reduce risk of Colon Cancer 40 %, ° Skin Cancer 26 % ,  °Melanoma 46% ° and  °Pancreatic cancer 60% °++++++++++++++++++++++++++++++++ °Vitamin D goal ° is between 70-100.  °Please make sure that you are taking your Vitamin D as directed.  °It is very important as a natural anti-inflammatory  °helping hair, skin, and nails, as well as reducing stroke and heart attack risk.  °It helps your bones and helps with mood. °It also decreases numerous cancer risks so please take it as directed.  °Low Vit D is associated with a 200-300% higher risk for CANCER  °and 200-300% higher risk for HEART   ATTACK  &  STROKE.   °...................................... °It is also associated with higher death rate at younger ages,  °autoimmune diseases like Rheumatoid arthritis, Lupus, Multiple Sclerosis.    °Also many other serious conditions, like depression, Alzheimer's °Dementia, infertility, muscle aches, fatigue, fibromyalgia - just to name a few. °++++++++++++++++++++ °Recommend the book "The END of DIETING" by Dr Joel Fuhrman  °& the book "The END of DIABETES " by Dr Joel Fuhrman °At [employer].com - get book & Audio CD's  °  Being diabetic has a  300% increased risk for heart attack, stroke, cancer, and alzheimer- type vascular dementia. It is very important that you work harder with diet by avoiding all foods that are white. Avoid white rice (brown & wild rice is OK), white potatoes (sweetpotatoes in moderation is OK), White bread or wheat bread or anything made out of white flour like bagels, donuts, rolls, buns, biscuits, cakes, pastries, cookies, pizza crust, and pasta (made from white flour & egg whites)  - vegetarian pasta or spinach or wheat pasta is OK. Multigrain breads like Arnold's or [employer], or multigrain sandwich thins or flatbreads.  Diet, exercise and weight loss can reverse and cure diabetes in the early stages.  Diet, exercise and weight loss is very important in the control and prevention of complications of diabetes which affects every system in your body, ie. Brain - dementia/stroke, eyes - glaucoma/blindness, heart - heart attack/heart failure, kidneys - dialysis, stomach - gastric paralysis, intestines - malabsorption, nerves - severe painful neuritis, circulation - gangrene & loss of a leg(s), and finally cancer and Alzheimers. ° °  I recommend avoid fried & greasy foods,  sweets/candy, white rice (brown or wild rice or Quinoa is OK), white potatoes (sweet potatoes are OK) - anything made from white flour - bagels, doughnuts, rolls, buns, biscuits,white and wheat breads, pizza crust and traditional pasta made of white flour & egg white(vegetarian pasta or spinach or wheat pasta is OK).    Multi-grain bread is OK - like multi-grain flat bread or sandwich thins. Avoid alcohol in excess. Exercise is also important. ° °  Eat all the vegetables you want - avoid meat, especially red meat and dairy - especially cheese.  Cheese is the most concentrated form of trans-fats which is the worst thing to clog up our arteries. Veggie cheese is OK which can be found in the fresh produce section at [employer] or [employer] or [employer] ° °+++++++++++++++++++++ °DASH Eating Plan ° °DASH stands for "Dietary Approaches to Stop Hypertension."  ° °The DASH eating plan is a healthy eating plan that has been shown to reduce high blood pressure (hypertension). Additional health benefits may include reducing the risk of type 2 diabetes mellitus, heart disease, and stroke. The DASH eating plan may also help with weight loss. °WHAT DO I NEED TO KNOW ABOUT THE DASH EATING PLAN? °For the DASH eating plan, you will  follow these general guidelines: °Choose foods with a percent daily value for sodium of less than 5% (as listed on the food label). °Use salt-free seasonings or herbs instead of table salt or sea salt. °Check with your health care provider or pharmacist before using salt substitutes. °Eat lower-sodium products, often labeled as "lower sodium" or "no salt added." °Eat fresh foods. °Eat more vegetables, fruits, and low-fat dairy products. °Choose whole grains. Look for the word "whole" as the first word in the ingredient list. °Choose fish  °Limit sweets, desserts, sugars, and sugary drinks. °Choose heart-healthy fats. °Eat veggie cheese  °Eat more home-cooked food and less restaurant, buffet, and fast food. °Limit fried foods. °Cook foods using methods other than frying. °Limit canned vegetables. If you do use them, rinse them well to decrease the sodium. °When eating at a restaurant, ask that your food be prepared with less salt, or no salt if possible. °                  °   WHAT FOODS CAN I EAT? °Read Dr Joel Fuhrman's books on The End of Dieting & The End of Diabetes ° °Grains °Whole grain or whole wheat bread. Brown rice. Whole grain or whole wheat pasta. Quinoa, bulgur, and whole grain cereals. Low-sodium cereals. Corn or whole wheat flour tortillas. Whole grain cornbread. Whole grain crackers. Low-sodium crackers. ° °Vegetables °Fresh or frozen vegetables (raw, steamed, roasted, or grilled). Low-sodium or reduced-sodium tomato and vegetable juices. Low-sodium or reduced-sodium tomato sauce and paste. Low-sodium or reduced-sodium canned vegetables.  ° °Fruits °All fresh, canned (in natural juice), or frozen fruits. ° °Protein Products ° All fish and seafood.  Dried beans, peas, or lentils. Unsalted nuts and seeds. Unsalted canned beans. ° °Dairy °Low-fat dairy products, such as skim or 1% milk, 2% or reduced-fat cheeses, low-fat ricotta or cottage cheese, or plain low-fat yogurt. Low-sodium or reduced-sodium  cheeses. ° °Fats and Oils °Tub margarines without trans fats. Light or reduced-fat mayonnaise and salad dressings (reduced sodium). Avocado. Safflower, olive, or canola oils. Natural peanut or almond butter. ° °Other °Unsalted popcorn and pretzels. °The items listed above may not be a complete list of recommended foods or beverages. Contact your dietitian for more options. ° °+++++++++++++++ ° °WHAT FOODS ARE NOT RECOMMENDED? °Grains/ White flour or wheat flour °White bread. White pasta. White rice. Refined cornbread. Bagels and croissants. Crackers that contain trans fat. ° °Vegetables ° °Creamed or fried vegetables. Vegetables in a . Regular canned vegetables. Regular canned tomato sauce and paste. Regular tomato and vegetable juices. ° °  Fruits Dried fruits. Canned fruit in light or heavy syrup. Fruit juice.  Meat and Other Protein Products Meat in general - RED meat & White meat.  Fatty cuts of meat. Ribs, chicken wings, all processed meats as bacon, sausage, bologna, salami, fatback, hot dogs, bratwurst and packaged luncheon meats.  Dairy Whole or 2% milk, cream, half-and-half, and cream cheese. Whole-fat or sweetened yogurt. Full-fat cheeses or blue cheese. Non-dairy creamers and whipped toppings. Processed cheese, cheese spreads, or cheese curds.  Condiments Onion and garlic salt, seasoned salt, table salt, and sea salt. Canned and packaged gravies. Worcestershire sauce. Tartar sauce. Barbecue sauce. Teriyaki sauce. Soy sauce, including reduced sodium. Steak sauce. Fish sauce. Oyster sauce. Cocktail sauce. Horseradish. Ketchup and mustard. Meat flavorings and tenderizers. Bouillon cubes. Hot sauce. Tabasco sauce. Marinades. Taco seasonings. Relishes.  Fats and Oils Butter, stick margarine, lard, shortening and bacon fat. Coconut, palm kernel, or palm oils. Regular salad dressings.  Pickles and olives. Salted popcorn and pretzels.  The items listed above may not be a complete list of foods and  beverages to avoid.  ======================  Parkinson's Disease  Parkinson's disease is a movement disorder. It is a long-term condition that gets worse over time. Each person with Parkinson's disease is affected differently. This condition limits a person's ability to control movements and move the body normally. The condition can range from mild to severe. Parkinson's disease tends to get worse slowly over several years. What are the causes? Parkinson's disease is caused by a loss of brain cells (neurons) that make a brain chemical called dopamine. Dopamine is needed to control movement. As the condition gets worse, more neurons that make dopamine die. This makes it hard to move or control your movements. The exact cause of the loss of neurons is not known. Genes and the environment may contribute to the cause of Parkinson's disease. What increases the risk? The following factors may make you more likely to develop this condition: Being female. Being age 81 or older. Having a family history of Parkinson's disease. Having had a traumatic brain injury. Having been exposed to toxins, such as pesticides. Having depression. What are the signs or symptoms? Symptoms of this condition can vary. The main symptoms are related to movement. These include: A tremor or shaking while you are resting. You cannot control the shaking. Stiffness in your arms and legs (rigidity). Slowing of movement. You may lose facial expressions and have trouble making small movements that are needed to button clothing or brush your teeth. An abnormal walk. You may walk with short, shuffling steps. Loss of balance and stability when standing. You may sway, fall backward, and have trouble making turns. Other symptoms include: Mental or cognitive changes, including: Depression or anxiety. Having false beliefs (delusions). Seeing, hearing, or feeling things that do not exist (hallucinations). Trouble speaking or  swallowing. Changes in bowel or bladder functions, including constipation, having to go urgently or frequently, or not being able to control your bowel or bladder. Changes in sleep habits, acting out dreams, or trouble sleeping. Depending on the severity of the symptoms, Parkinson's disease may be mild, moderate, or advanced. Parkinson's disease progression is different for everyone. Some people may not progress to the advanced stage. Mild Parkinson's disease involves: Movement problems that do not affect daily activities. Movement problems on one side of the body. Moderate Parkinson's disease involves: Movement problems on both sides of the body. Slowing of movement. Coordination and balance problems. Advanced Parkinson's disease involves: Extreme difficulty walking. Inability  to live alone safely. Signs of dementia, such as having trouble remembering things, doing daily tasks such as getting dressed, and problem solving. How is this diagnosed? This condition is diagnosed by a specialist. A diagnosis may be made based on symptoms, your medical history, and a physical exam. You may also have brain imaging tests to check for loss of neurons in the brain. How is this treated? There is no cure for Parkinson's disease. Treatment focuses on managing your symptoms. Treatment may include: Medicines. Everyone responds to medicines differently. Your response may change over time. Work with your health care provider to find the best medicines for you. Speech, occupational, and physical therapy. Deep brain stimulation surgery to reduce tremors and other involuntary movements. Follow these instructions at home: Medicines Take over-the-counter and prescription medicines only as told by your health care provider. Avoid taking medicines that can affect thinking, such as pain or sleeping medicines. Eating and drinking Follow instructions from your health care provider about eating or drinking  restrictions. Do not drink alcohol. Activity Ask your health care provider if it is safe for you to drive. Do exercises as told by your health care provider or physical therapist. Lifestyle  Install grab bars and railings in your home to prevent falls. Do not use any products that contain nicotine or tobacco. These products include cigarettes, chewing tobacco, and vaping devices, such as e-cigarettes. If you need help quitting, ask your health care provider. Consider joining a support group for people with Parkinson's disease. General instructions Work with your health care provider to know the kind of day-to-day help that you may need and what to do to stay safe. Keep all follow-up visits. This is important. Follow-up visits include any visits with a physical therapist, speech therapist, or occupational therapist. Where to find more information [employer] of Neurological Disorders and Stroke: [employer] [place]: www.parkinson.org Contact a health care provider if: Medicines do not help your symptoms. You are unsteady or have fallen at home. You need more support to function well at home. You have trouble swallowing. You have severe constipation. You are having problems with side effects from your medicines. You feel confused, anxious, depressed, or have hallucinations. Get help right away if you: Are injured after a fall. Cannot swallow without choking. Have chest pain or trouble breathing. Do not feel safe at home. Have thoughts about hurting yourself or others.  These symptoms may represent a serious problem that is an emergency. Do not wait to see if the symptoms will go away. Get medical help right away. Call your local emergency services (911 in the [place] Do not drive yourself to the hospital. If you ever feel like you may hurt yourself or others, or have thoughts about taking your own life, get help right away. Go to your nearest emergency  department or: Call your local emergency services (911 in the [place] Call a suicide crisis helpline, such as the [facility] at 339-333-1776 or 988 in the [place]. This is open 24 hours a day in the [place] Text the Crisis Text Line at 205 456 4271 (in the [place].). Summary Parkinson's disease is a long-term condition that gets worse over time. This condition limits your ability to control your movements and move your body normally. There is no cure for Parkinson's disease. Treatment focuses on managing your symptoms. Work with your health care provider to know the kind of day-to-day help that you may need and what to do to stay safe. Keep all follow-up  visits, including any visits with a physical therapist, speech therapist, or occupational therapist. This is important.  This information is not intended to replace advice given to you by your health care provider. Make sure you discuss any questions you have with your health care provider.

## 2021-11-09 DIAGNOSIS — G20 Parkinson's disease: Secondary | ICD-10-CM | POA: Diagnosis not present

## 2021-11-09 DIAGNOSIS — M8000XS Age-related osteoporosis with current pathological fracture, unspecified site, sequela: Secondary | ICD-10-CM | POA: Diagnosis not present

## 2021-11-09 DIAGNOSIS — N1831 Chronic kidney disease, stage 3a: Secondary | ICD-10-CM | POA: Diagnosis not present

## 2021-11-09 DIAGNOSIS — I509 Heart failure, unspecified: Secondary | ICD-10-CM | POA: Diagnosis not present

## 2021-11-09 DIAGNOSIS — G894 Chronic pain syndrome: Secondary | ICD-10-CM | POA: Diagnosis not present

## 2021-11-09 DIAGNOSIS — I130 Hypertensive heart and chronic kidney disease with heart failure and stage 1 through stage 4 chronic kidney disease, or unspecified chronic kidney disease: Secondary | ICD-10-CM | POA: Diagnosis not present

## 2021-11-09 LAB — LIPID PANEL
Cholesterol: 209 mg/dL — ABNORMAL HIGH (ref <200)
HDL: 46 mg/dL — ABNORMAL LOW (ref >=50)
LDL Cholesterol (Calc): 125 mg/dL (calc) — ABNORMAL HIGH
Non-HDL Cholesterol (Calc): 163 mg/dL (calc) — ABNORMAL HIGH (ref <130)
Total CHOL/HDL Ratio: 4.5 (calc) (ref <5.0)
Triglycerides: 250 mg/dL — ABNORMAL HIGH (ref <150)

## 2021-11-09 LAB — COMPLETE METABOLIC PANEL WITH GFR
AG Ratio: 2.2 (calc) (ref 1.0–2.5)
ALT: 4 U/L — ABNORMAL LOW (ref 6–29)
AST: 13 U/L (ref 10–35)
Albumin: 4.3 g/dL (ref 3.6–5.1)
Alkaline phosphatase (APISO): 99 U/L (ref 37–153)
BUN/Creatinine Ratio: 28 (calc) — ABNORMAL HIGH (ref 6–22)
BUN: 27 mg/dL — ABNORMAL HIGH (ref 7–25)
CO2: 26 mmol/L (ref 20–32)
Calcium: 9.6 mg/dL (ref 8.6–10.4)
Chloride: 108 mmol/L (ref 98–110)
Creat: 0.97 mg/dL (ref 0.60–1.00)
Globulin: 2 g/dL (calc) (ref 1.9–3.7)
Glucose, Bld: 91 mg/dL (ref 65–99)
Potassium: 4.4 mmol/L (ref 3.5–5.3)
Sodium: 141 mmol/L (ref 135–146)
Total Bilirubin: 0.4 mg/dL (ref 0.2–1.2)
Total Protein: 6.3 g/dL (ref 6.1–8.1)
eGFR: 59 mL/min/{1.73_m2} — ABNORMAL LOW (ref >=60)

## 2021-11-09 LAB — CBC WITH DIFFERENTIAL/PLATELET
Absolute Monocytes: 350 cells/uL (ref 200–950)
Basophils Absolute: 30 cells/uL (ref 0–200)
Basophils Relative: 0.6 %
Eosinophils Absolute: 30 cells/uL (ref 15–500)
Eosinophils Relative: 0.6 %
HCT: 37.3 % (ref 35.0–45.0)
Hemoglobin: 12.3 g/dL (ref 11.7–15.5)
Lymphs Abs: 830 cells/uL — ABNORMAL LOW (ref 850–3900)
MCH: 31.5 pg (ref 27.0–33.0)
MCHC: 33 g/dL (ref 32.0–36.0)
MCV: 95.4 fL (ref 80.0–100.0)
MPV: 11 fL (ref 7.5–12.5)
Monocytes Relative: 7 %
Neutro Abs: 3760 cells/uL (ref 1500–7800)
Neutrophils Relative %: 75.2 %
Platelets: 253 10*3/uL (ref 140–400)
RBC: 3.91 10*6/uL (ref 3.80–5.10)
RDW: 12.3 % (ref 11.0–15.0)
Total Lymphocyte: 16.6 %
WBC: 5 10*3/uL (ref 3.8–10.8)

## 2021-11-09 LAB — HEMOGLOBIN A1C
Hgb A1c MFr Bld: 5.4 % of total Hgb (ref <5.7)
Mean Plasma Glucose: 108 mg/dL
eAG (mmol/L): 6 mmol/L

## 2021-11-09 LAB — VITAMIN D 25 HYDROXY (VIT D DEFICIENCY, FRACTURES): Vit D, 25-Hydroxy: 85 ng/mL (ref 30–100)

## 2021-11-09 LAB — MAGNESIUM: Magnesium: 2.2 mg/dL (ref 1.5–2.5)

## 2021-11-09 LAB — TSH: TSH: 1.03 mIU/L (ref 0.40–4.50)

## 2021-11-09 LAB — INSULIN, RANDOM: Insulin: 8.5 u[IU]/mL

## 2021-11-09 NOTE — Progress Notes (Signed)
============================================================ ============================================================  - Kidney functions still look a little dehydrated    Very important to drink adequate amounts of fluids to                                                                               prevent permanent damage    - Recommend drink at least                               6 bottles (16 ounces) of fluids /water /day = 96 Oz ~100 oz  - 100 oz = 3,000 cc or 3 liters / day  - >>                                               That's 1 &1/2 bottles of a 2 liter soda bottle /day !   o.o.o.o.o.o.o.o.o.o.o.o.o.o.o.o.o.o.o.o.o.o.o.o.o.o.o.o.o.o.o.o.o.o.o.o.o.o.o.o.o.o. o.o.o.o.o.o.o.o.o.o.o.o.o.o.o.o.o.o.o.o.o.o.o.o.o.o.o.o.o.o.o.o.o.o.o.o.o.o.o.o.o.o.   -  Total  Chol =      209        - Elevated             (  Ideal  or  Goal is less than 180  !  )   - and   -  Bad / Dangerous LDL  Chol =      125    - also Elevated              (  Ideal  or  Goal is less than 70  !  )  o.o.o.o.o.o.o.o.o.o.o.o.o.o.o.o.o.o.o.o.o.o.o.o.o.o.o.o.o.o.o.o.o.o.o.o.o.o.o.o.o.o.  - Recommend low cholesterol diet   - Cholesterol only comes from animal sources  - ie. meat, dairy, egg yolks o.o.o.o.o.o.o.o.o.o.o.o.o.o.o.o.o.o.o.o.o.o.o.o.o.o.o.o.o.o.o.o.o.o.o.o.o.o.o.o.o.o.  - Eat all the vegetables you want.  o.o.o.o.o.o.o.o.o.o.o.o.o.o.o.o.o.o.o.o.o.o.o.o.o.o.o.o.o.o.o.o.o.o.o.o.o.o.o.o.o.o.  - Avoid meat, especially red meat - Beef AND Pork .  - Avoid cheese & dairy - milk & ice cream.     - Cheese is the most concentrated form of trans-fats which  is the worst thing to clog up our arteries.   o.o.o.o.o.o.o.o.o.o.o.o.o.o.o.o.o.o.o.o.o.o.o.o.o.o.o.o.o.o.o.o.o.o.o.o.o.o.o.o.o.o.  - Veggie cheese is OK which can be found in the fresh  produce section at [employer] or [employer] or [employer]  o.o.o.o.o.o.o.o.o.o.o.o.o.o.o.o.o.o.o.o.o.o.o.o.o.o.o.o.o.o.o.o.o.o.o.o.o.o.o.o.o.o.  - Also  Triglycerides (  250  ) or fats in blood are too high  (goal is less than 150)    - Recommend avoid fried & greasy foods,  sweets / candy,   - Avoid white rice  (brown or wild rice or Quinoa is OK),   - Avoid white potatoes  (sweet potatoes are OK)   - Avoid anything made from white flour  - bagels, doughnuts, rolls, buns, biscuits, white and   wheat breads, pizza crust and traditional  pasta made of white flour & egg white  - (vegetarian pasta or spinach or wheat pasta is OK).    - Multi-grain bread is OK - like multi-grain flat bread or  sandwich thins.   - Avoid alcohol in excess.   - Exercise is also important.  o.o.o.o.o.o.o.o.o.o.o.o.o.o.o.o.o.o.o.o.o.o.o.o.o.o.o.o.o.o.o.o.o.o.o.o.o.o.o.o.o.o. o.o.o.o.o.o.o.o.o.o.o.o.o.o.o.o.o.o.o.o.o.o.o.o.o.o.o.o.o.o.o.o.o.o.o.o.o.o.o.o.o.o.  - A1c - Normal  - No Diabetes -  Great  !  o.o.o.o.o.o.o.o.o.o.o.o.o.o.o.o.o.o.o.o.o.o.o.o.o.o.o.o.o.o.o.o.o.o.o.o.o.o.o.o.o.o.  - Vitamin D =  85 - Excellent   o.o.o.o.o.o.o.o.o.o.o.o.o.o.o.o.o.o.o.o.o.o.o.o.o.o.o.o.o.o.o.o.o.o.o.o.o.o.o.o.o.o.  -  All Else - CBC - Kidneys - Electrolytes - Liver - Magnesium & Thyroid    - all  Normal / OK  o.o.o.o.o.o.o.o.o.o.o.o.o.o.o.o.o.o.o.o.o.o.o.o.o.o.o.o.o.o.o.o.o.o.o.o.o.o.o.o.o.o. o.o.o.o.o.o.o.o.o.o.o.o.o.o.o.o.o.o.o.o.o.o.o.o.o.o.o.o.o.o.o.o.o.o.o.o.o.o.o.o.o.o.

## 2021-11-10 ENCOUNTER — Ambulatory Visit: Payer: Medicare Other | Admitting: Pharmacist

## 2021-11-10 ENCOUNTER — Other Ambulatory Visit: Payer: Self-pay

## 2021-11-10 VITALS — BP 115/63 | HR 87

## 2021-11-10 DIAGNOSIS — M8000XS Age-related osteoporosis with current pathological fracture, unspecified site, sequela: Secondary | ICD-10-CM

## 2021-11-10 DIAGNOSIS — Z79899 Other long term (current) drug therapy: Secondary | ICD-10-CM

## 2021-11-10 DIAGNOSIS — G894 Chronic pain syndrome: Secondary | ICD-10-CM

## 2021-11-10 DIAGNOSIS — I10 Essential (primary) hypertension: Secondary | ICD-10-CM

## 2021-11-10 DIAGNOSIS — R7309 Other abnormal glucose: Secondary | ICD-10-CM

## 2021-11-10 DIAGNOSIS — M48062 Spinal stenosis, lumbar region with neurogenic claudication: Secondary | ICD-10-CM

## 2021-11-10 DIAGNOSIS — R296 Repeated falls: Secondary | ICD-10-CM

## 2021-11-10 DIAGNOSIS — E782 Mixed hyperlipidemia: Secondary | ICD-10-CM

## 2021-11-11 DIAGNOSIS — I130 Hypertensive heart and chronic kidney disease with heart failure and stage 1 through stage 4 chronic kidney disease, or unspecified chronic kidney disease: Secondary | ICD-10-CM | POA: Diagnosis not present

## 2021-11-11 DIAGNOSIS — G894 Chronic pain syndrome: Secondary | ICD-10-CM | POA: Diagnosis not present

## 2021-11-11 DIAGNOSIS — M8000XS Age-related osteoporosis with current pathological fracture, unspecified site, sequela: Secondary | ICD-10-CM | POA: Diagnosis not present

## 2021-11-11 DIAGNOSIS — I509 Heart failure, unspecified: Secondary | ICD-10-CM | POA: Diagnosis not present

## 2021-11-11 DIAGNOSIS — N1831 Chronic kidney disease, stage 3a: Secondary | ICD-10-CM | POA: Diagnosis not present

## 2021-11-11 DIAGNOSIS — G20 Parkinson's disease: Secondary | ICD-10-CM | POA: Diagnosis not present

## 2021-11-14 DIAGNOSIS — I509 Heart failure, unspecified: Secondary | ICD-10-CM | POA: Diagnosis not present

## 2021-11-14 DIAGNOSIS — G20 Parkinson's disease: Secondary | ICD-10-CM | POA: Diagnosis not present

## 2021-11-14 DIAGNOSIS — M8000XS Age-related osteoporosis with current pathological fracture, unspecified site, sequela: Secondary | ICD-10-CM | POA: Diagnosis not present

## 2021-11-14 DIAGNOSIS — I130 Hypertensive heart and chronic kidney disease with heart failure and stage 1 through stage 4 chronic kidney disease, or unspecified chronic kidney disease: Secondary | ICD-10-CM | POA: Diagnosis not present

## 2021-11-14 DIAGNOSIS — N1831 Chronic kidney disease, stage 3a: Secondary | ICD-10-CM | POA: Diagnosis not present

## 2021-11-14 DIAGNOSIS — G894 Chronic pain syndrome: Secondary | ICD-10-CM | POA: Diagnosis not present

## 2021-11-15 DIAGNOSIS — I509 Heart failure, unspecified: Secondary | ICD-10-CM | POA: Diagnosis not present

## 2021-11-15 DIAGNOSIS — G894 Chronic pain syndrome: Secondary | ICD-10-CM | POA: Diagnosis not present

## 2021-11-15 DIAGNOSIS — G20 Parkinson's disease: Secondary | ICD-10-CM | POA: Diagnosis not present

## 2021-11-15 DIAGNOSIS — N1831 Chronic kidney disease, stage 3a: Secondary | ICD-10-CM | POA: Diagnosis not present

## 2021-11-15 DIAGNOSIS — I130 Hypertensive heart and chronic kidney disease with heart failure and stage 1 through stage 4 chronic kidney disease, or unspecified chronic kidney disease: Secondary | ICD-10-CM | POA: Diagnosis not present

## 2021-11-15 DIAGNOSIS — M8000XS Age-related osteoporosis with current pathological fracture, unspecified site, sequela: Secondary | ICD-10-CM | POA: Diagnosis not present

## 2021-11-18 DIAGNOSIS — I509 Heart failure, unspecified: Secondary | ICD-10-CM | POA: Diagnosis not present

## 2021-11-18 DIAGNOSIS — M48062 Spinal stenosis, lumbar region with neurogenic claudication: Secondary | ICD-10-CM | POA: Diagnosis not present

## 2021-11-18 DIAGNOSIS — Z9181 History of falling: Secondary | ICD-10-CM | POA: Diagnosis not present

## 2021-11-18 DIAGNOSIS — G4752 REM sleep behavior disorder: Secondary | ICD-10-CM | POA: Diagnosis not present

## 2021-11-18 DIAGNOSIS — M8000XS Age-related osteoporosis with current pathological fracture, unspecified site, sequela: Secondary | ICD-10-CM | POA: Diagnosis not present

## 2021-11-18 DIAGNOSIS — K581 Irritable bowel syndrome with constipation: Secondary | ICD-10-CM | POA: Diagnosis not present

## 2021-11-18 DIAGNOSIS — G20 Parkinson's disease: Secondary | ICD-10-CM | POA: Diagnosis not present

## 2021-11-18 DIAGNOSIS — I130 Hypertensive heart and chronic kidney disease with heart failure and stage 1 through stage 4 chronic kidney disease, or unspecified chronic kidney disease: Secondary | ICD-10-CM | POA: Diagnosis not present

## 2021-11-18 DIAGNOSIS — R7309 Other abnormal glucose: Secondary | ICD-10-CM | POA: Diagnosis not present

## 2021-11-18 DIAGNOSIS — K219 Gastro-esophageal reflux disease without esophagitis: Secondary | ICD-10-CM | POA: Diagnosis not present

## 2021-11-18 DIAGNOSIS — R296 Repeated falls: Secondary | ICD-10-CM | POA: Diagnosis not present

## 2021-11-18 DIAGNOSIS — Z993 Dependence on wheelchair: Secondary | ICD-10-CM | POA: Diagnosis not present

## 2021-11-18 DIAGNOSIS — Z7951 Long term (current) use of inhaled steroids: Secondary | ICD-10-CM | POA: Diagnosis not present

## 2021-11-18 DIAGNOSIS — R3981 Functional urinary incontinence: Secondary | ICD-10-CM | POA: Diagnosis not present

## 2021-11-18 DIAGNOSIS — M5116 Intervertebral disc disorders with radiculopathy, lumbar region: Secondary | ICD-10-CM | POA: Diagnosis not present

## 2021-11-18 DIAGNOSIS — E559 Vitamin D deficiency, unspecified: Secondary | ICD-10-CM | POA: Diagnosis not present

## 2021-11-18 DIAGNOSIS — M961 Postlaminectomy syndrome, not elsewhere classified: Secondary | ICD-10-CM | POA: Diagnosis not present

## 2021-11-18 DIAGNOSIS — N1831 Chronic kidney disease, stage 3a: Secondary | ICD-10-CM | POA: Diagnosis not present

## 2021-11-18 DIAGNOSIS — Z981 Arthrodesis status: Secondary | ICD-10-CM | POA: Diagnosis not present

## 2021-11-18 DIAGNOSIS — E782 Mixed hyperlipidemia: Secondary | ICD-10-CM | POA: Diagnosis not present

## 2021-11-18 DIAGNOSIS — G894 Chronic pain syndrome: Secondary | ICD-10-CM | POA: Diagnosis not present

## 2021-11-22 NOTE — Progress Notes (Signed)
Pt is aware of lab results and recommendations, did not have any questions or concerns for the provider or nurse.

## 2021-11-24 DIAGNOSIS — M8000XS Age-related osteoporosis with current pathological fracture, unspecified site, sequela: Secondary | ICD-10-CM | POA: Diagnosis not present

## 2021-11-24 DIAGNOSIS — I10 Essential (primary) hypertension: Secondary | ICD-10-CM | POA: Diagnosis not present

## 2021-11-24 DIAGNOSIS — G20 Parkinson's disease: Secondary | ICD-10-CM | POA: Diagnosis not present

## 2021-11-24 DIAGNOSIS — E782 Mixed hyperlipidemia: Secondary | ICD-10-CM | POA: Diagnosis not present

## 2021-11-24 DIAGNOSIS — N1831 Chronic kidney disease, stage 3a: Secondary | ICD-10-CM | POA: Diagnosis not present

## 2021-11-24 DIAGNOSIS — I130 Hypertensive heart and chronic kidney disease with heart failure and stage 1 through stage 4 chronic kidney disease, or unspecified chronic kidney disease: Secondary | ICD-10-CM | POA: Diagnosis not present

## 2021-11-24 DIAGNOSIS — G894 Chronic pain syndrome: Secondary | ICD-10-CM | POA: Diagnosis not present

## 2021-11-24 DIAGNOSIS — M48062 Spinal stenosis, lumbar region with neurogenic claudication: Secondary | ICD-10-CM | POA: Diagnosis not present

## 2021-11-24 DIAGNOSIS — I509 Heart failure, unspecified: Secondary | ICD-10-CM | POA: Diagnosis not present

## 2021-11-24 NOTE — Progress Notes (Signed)
Patient Visit with Chart Prep     CHRISTINAMARIE, TALL  [id number]  26 years, Female  DOB: 1941/07/11  M: [phone]  Care Team: Rhys Martini, Tiyona Desouza    __________________________________________________  Summary: Evony Rezek is a friendly 80 year old woman who presents for initial CCM visit. She has no chief complaints today   Recommendations/Changes made from today's visit: Recommended patient take Calcium Citrate 600mg  BID for bone strength   Recommended significant dietary changes to help lower lipid levels   Patient scheduled?for?CCM visit with the clinical pharmacist.? Patient is referred for CCM by their PCP and CPP is under general PCP supervision.: At least 2 of these conditions are expected to last 12 months or longer and patient is at significant risk for acute exacerbations and/or functional decline.  Patient has consented to participation in [facility] program.  Visit Type: Clinic visit  Date of Upcoming Visit: 10/11/2021  Patient Chart Prep [facility])    Patient's Chronic Conditions: Hypertension (HTN), Gastroesophageal Reflux Disease (GERD), Parkinson's Disease, Osteopenia or Osteoporosis, Chronic Kidney Disease (CKD), Hyperlipidemia/Dyslipidemia (HLD), Other, Chronic Pain, Constipation, Diabetes (DM), Vitamin D deficiency    Doctor and Hospital Visits    Were there PCP Visits in last 6 months?: Yes  Visit #1: 05/02/2021- Dr. Mamie Levers presented for follow up. BP 112/72, HR 84. Start Fluconazole 100mg , daily for 2 weeks.   Visit #2: 08/16/21 Liane Comber NP: medicare annual wellness. STOP fluconazole 100mg  and sulfamethoxazole trimethoprim 600-160mg . START senokot daily. BP: 110/70 HR: 91  Were there Specialist Visits in last 6 months?: Yes  Visit #1: 06/21/2021- Dr. [name](Neurology)-Patient presented for follow up Parkinson's. BP 118/74, HR 97. Change carbidopa-levodopa (SINEMET IR) 25-100 MG tablet from daily to  TAKE 2 TABS AT 5AM, TAKE 1 TAB AT 7AM, TAKE 2 TABS AT  9AM, TAKE 1 TAB AT 11AM, TAKE 2 TABS AT 1PM, TAKE 1 TAB AT 3PM, TAKE 1 TAB AT 5PM AND 1 TAB AT BEDTIME.    Was there a Hospital Visit in last 30 days?: No  Were there other Hospital Visits in last 6 months?: No    Medication Information  Are there any Medication discrepancies?: No  Are there any Medication adherence gaps (beyond 5 days past due)?: No  Medication adherence rates for the STAR rating drugs: none  List Patient's current Care Gaps: No current Care Gaps identified   Pre-Call Questions [facility])  Are you able to connect with Patient: Yes  Visit Type: Phone Call  May we confirm what is the best phone # for the pharmacist to call you?: [phone]  Confirm visit date/time: 11/10/2021 1:30pm  Have you seen any other providers since your last visit?: No  Any changes in your medicines or health?: No  Any side effects from any medications?: No  Do you have any symptoms or problems not managed by your medications?: No  What are your top 2 health concerns right now?: none  What are your top 2 medication concerns right now?: Other  Details: none  Has your Dr asked that you take BP, BG or special diet at home?: Exercise, Specific diet  Details: increase fiber  Do you get any type of exercise on a regular basis?: Yes  What type of exercise and how often?: no formal exercise.   What are your top 1 or 2 goals for your health?: none  What are you doing to try to reach these goals?: Exercise, Diet, Taking medications  What, if any, problems do you have getting  your medications from the pharmacy?: None  Is there anything else that you would like to discuss during the appointment?: No    Subjective Information  Current BP: 110/70  Current HR: 91  taken on: 08/15/2021  Previous BP: 118/74  Previous HR: 97  taken on: 06/20/2021  Weight: 146lb  BMI: 28.51  Last GFR: 64  taken on: 08/15/2021  Why did the patient present?: CCM initial visit  Marital status?: Married  Details: Charlestine Massed   Retired??Previous work?: Retired  Chartered loss adjuster does the patient do during the day?: Patient is bound to a wheelchair, husband helps her to preform daily tasks . Has family in [place]. She has a brother in [place] does the patient spend their time with and what do they do?: Spends most time with her husband.   She also has a sitter who comes on Monday and Wednesday to check on  her.  Lifestyle habits such as diet and exercise?: Diet: Raisin bran for breakfast, sausage patty toast scrambled egg, boiled egg.  usually eats 3 small meals per day  No coffee  Water: does not drink enough. maybe 2 glassfuls    Exercise: Bound to wheelchair  Alcohol,?tobacco,?and illicit drug usage?: None  What is the patient's sleep pattern?: No sleep issues, Other (with free form text)  Details: Pt gets up 2-5 times a night to urinate  How many hours per night does patient typically sleep?: 6-7 hours  Patient pleased with health care?they are?receiving?: Yes  Family, occupational,?and living circumstances relevant to?overall health?: Her family history includes CVA in her brother; Esophageal cancer in her mother; Heart attack in her mother; Hypercholesterolemia in her brother; Hypertension in her brother; Mental retardation in her brother; Suicidality in her brother and father.  Factors?that may?affect?medication?adherence?: Disability  Name and location of Current pharmacy: [place], [place] - [address] (Ph: [phone])  Current Rx insurance plan: [employer]  Are meds synced by current pharmacy?: No  Are meds delivered by current pharmacy?: No - delivery not available  Would patient benefit from direct intervention of clinical lead in dispensing process to optimize clinical outcomes?: Yes  Are [facility] pharmacy services available where patient lives?: Yes  Is patient disadvantaged to use [facility]?: No  [facility] services reviewed with patient and patient wishes to change  pharmacy?: No  Select reason patient declined to change pharmacies: Patient preference  Does patient experience delays in picking up medications due to transportation concerns (getting to pharmacy)?: No  Any additional demeanor/mood notes?: Caliope Ruppert is a friendly 80 year old woman who presents for initial CCM visit. She has no chief complaints today    Hypertension (HTN)  Assess this condition today?: Yes  Is patient able to obtain BP reading today?: Yes  BP today is: 115/63  Heart Rate is: 87  Goal: <140/90 mmHG  Hypertension Stage: Normal (SBP <120?and?DBP < 80)  Is Patient checking BP at home?: Yes  Pt. home BP readings are ranging: 100s/70s  How often does patient miss taking their blood pressure medications?: Pt takes as needed, BP has not been high so pt has not been taking  Has patient experienced hypotension, dizziness, falls or bradycardia?: No  Check present secondary causes (below) for HTN: CKD  BP RPM device: Does patient qualify?: Yes  We discussed: Reducing the amount of salt intake to 1500mg /per day., Recommend using a salt substitute to replace your salt if you need flavor., DASH diet:? following a diet?emphasizing fruits and  vegetables and?low-fat?dairy products along with whole grains, fish, poultry, and nuts. Reducing red meats and sugars., Proper Home BP Measurement  Assessment:: Controlled  Drug: Hydrochlorothiazide 25mg  QD  Assessment: Appropriate, Effective, Safe, Accessible  Drug: Midodrine 10mg  1/2-1 tab 3 times a day as needed for low BP  Assessment: Appropriate, Effective, Safe, Accessible  HC Follow up: N/A  Pharmacist Follow up: Continue to monitor BP, assess for hypotension/dizziness/falls    Hyperlipidemia/Dyslipidemia (HLD)  Last Lipid panel on: 11/08/2021  TC (Goal<200): 209  LDL: 125  HDL (Goal>40): 46  TG (Goal<150): 250  ASCVD 10-year risk?is:: N/A due to Age > 79  Assess this condition today?: Yes  LDL Goal: <100  Has patient tried and  failed any HLD Medications?: Yes  Medications failed: rosuvastatin  rosuvastatin: age  Check present secondary causes (below) that can lead to increased cholesterol levels (multi-choice optional): Thiazide diuretics, CKD  We discussed: How a diet high in plant sterols (fruits/vegetables/nuts/whole grains/legumes) may reduce your cholesterol., Encouraged increasing fiber to a daily intake of 10-25g/day  Assessment:: Uncontrolled  Drug: None  Plan to Counsel: Extensively counseled patient on importance of diet changes. Pt to incorporate more fruits, veggies and nuts into diet to decrease cholesterol  HC Follow up: N/A  Pharmacist Follow up: Continue to monitor Lipids q34months.     Diabetes (DM)  Current A1C: 5.4  taken on: 11/08/2021  Previous A1C: 5.4  taken on: 05/01/2021  Type: Pre-Diabetes/Impaired Fasting Glucose  Assess this condition today?: Yes  Goal A1C: < 6.5 %  Type: Pre-Diabetes/Impaired Fasting Glucose  We discussed: How to recognize and treat signs of hypoglycemia., Low carbohydrate eating plan with an emphasis on whole grains, legumes, nuts, fruits, and vegetables and minimal refined and processed foods.  Assessment:: Controlled  Drug: None  Additional Info: Congratulated patient on great blood sugar control  HC Follow up: N/A  Pharmacist Follow up: Monitor A1c, FBG    Chronic kidney disease (CKD)  Previous GFR: 59  taken on: 11/08/2021  The current microalbumin ratio is: N/A  tested on: 10/07/2021  Assess this condition today?: Yes  CKD Stage: Stage 3a (GFR 45-60 mL/min)  Contributing factors for developing CKD: HTN  Is Patient taking statin medication: No  Reason patient is not taking statin(s): Age  Is patient taking ACEi / ARB?: No  Reason patient is not taking ACEi / ARB: BP controlled  Renal dose adjustments recommended?: No  We discussed: Limiting dietary sodium intake to less than 2000 mg / day, Maintaining blood pressure control, Maintaining blood glucose  control  Assessment:: Controlled  Drug: None  HC Follow up: N/A  Pharmacist Follow up: Continue to monitor SCr, GFR, adjust meds as necessary    Osteoporosis  Current T-score: Left femur neck -2.6, Right forearm -3.4  taken on: 06/17/2018  Current Vitamin D 25-OH: 76  taken on: 05/01/2021  Previous Vitamin D 25-OH: 70  taken on: 01/18/2021  Assess this condition today?: Yes  Patient has: Osteoporosis  In the past 12 months, have you fallen?: Yes  What were the circumstances?: loses balance. Parkinson's also limits her movement and balance  How many times have you fallen?: At least 12 times and hit head 14 times.   Are there any stairs in or around the home?: No  Is the home free of loose throw rugs in walkways, pet beds, electrical cords, etc.?: No  Is there adequate lighting in your home to reduce the risk of falls?: Yes  Dietary calcium intake: Milk  We  discussed: Dietary calcium intake, Fall prevention  Patient has tried and failed: Fosamax - 3-4 years ago, this medication upset her stomach  Assessment:: Uncontrolled  Drug: Vitamin D3 5000 units QD  Assessment: Appropriate, Effective, Safe, Accessible  Plan to Counsel: Recommended patient take Calcium Citrate 600mg  BID for bone strength   HC Follow up: Fall assessment February  Pharmacist Follow up: Pt due for Dexa Scan (access T-score), Vit D levels    Chronic Pain  Assess this condition today?: Yes  Where is the pain primarily located?: Lower back and shoulder blades  How would you rate your pain without medications (scale of 1-10)?: 10  How would you rate your pain with medications (scale of 1-10)?: 3  What time of day is your pain at it's worst?: Morning  What makes your pain better?: Tylenol helps at times but pt does not want to try any strong medications  Any issues with constipation related to your medications?: No  Assessment:: Controlled  Drug: Tylenol 500mg  TID  Assessment: Appropriate, Effective, Safe,  Accessible  Plan to Counsel: Recommend pt to try IcyHot rub to aid with pain  HC Follow up: N/A  Pharmacist Follow up: Assess pain management at next visit    Exercise, Diet and Non-Drug Coordination Needs  Additional exercise counseling points. We discussed: decreasing sedentary behavior, Other  Details: Discussed wheelchair exercise  Additional diet counseling points. We discussed: aiming to consume at least 8 cups of water day, key components of the DASH diet  Discussed Non-Drug Care Coordination Needs: Yes  Does Patient have Medication financial barriers?: No    Accountable Health Communities Health-Related Social Needs Screening Tool -   SDOH   ([email or web address])  What is your living situation today? (ref #1): I have a steady place to live  Think about the place you live. Do you have problems with any of the following? (ref #2): None of the above  Within the past 12 months, you worried that your food would run out before you got money to buy more (ref #3): Never true  Within the past 12 months, the food you bought just didn't last and you didn't have money to get more (ref #4): Never true  In the past 12 months, has lack of reliable transportation kept you from medical appointments, meetings, work or from getting things needed for daily living? (ref #5): No  In the past 12 months, has the electric, gas, oil, or water company threatened to shut off services in your home? (ref #6): No  How often does anyone, including family and friends, physically hurt you? (ref #7): Never (1)  How often does anyone, including family and friends, insult or talk down to you? (ref #8): Never (1)  How often does anyone, including friends and family, threaten you with harm? (ref #9): Never (1)  How often does anyone, including family and friends, scream or curse at you? (ref #10): Never (1)   Engagement Notes  Newton Pigg on 11/10/2021 08:25 PM   CPP Chart Review: 28 min  CPP Office Visit: 32 min  CPP Office Visit Documentation: 32 min  CPP Coordination of Care: 0 min HC Care Plan Completion:  CPP Care Plan Review: 24 min     Patient Name:?Tzivia Mcnear  DOB:??December 09, 2040  ?  Last Care Plan Update:? 11/10/2021 COMPREHENSIVE CARE PLAN AND GOALS?    HYPERTENSION   MOST RECENT BLOOD PRESSURE:      112/72  MY GOAL BLOOD PRESSURE:   <140/90 mmHG  CURRENT  MEDICATION AND DOSING:   Hydrochlorothiazide 25mg  daily, Midodrine 10mg  1/2-1 tab 3 times a day as needed for low BP  THE GOALS WE HAVE CHOSEN ARE:     -Maintain an at goal blood pressure   BARRIERS TO ACHIEVING GOALS:   -At Goal  PLAN TO WORK ON THESE GOALS:   -Take medications regularly    -Check BP at home   -Reduce salt intake (< 1500mg / day)   -Diet: DASH diet (Choose fruits, vegetables, and low-fat dairy products. Increase whole grains, fish, poultry, nuts. Reduce red meats and sugars)   -Weight: 1 kg = ~1 mmHg reduction   -Exercise  .  Prediabetes/ Impaired Fasting Glucose   MOST RECENT A1C:         5.4 on 05/02/21                                                      Recent fasting glucose:  91 mg/dL on 11/08/21 CURRENT REGIMEN AND DOSING:   -None  THE GOALS WE HAVE CHOSEN ARE:   -Lower A1C/ Fasting Glucose   PLAN TO WORK ON THESE GOALS:     -Focus on managing/monitoring CVD risk factors, including keeping blood pressure and lipids under goal.    -Modify lifestyle, including participating in moderate physical activity (e.g., walking) at least 150 minutes per week.    -Consider a Mediterranean eating plan with an emphasis on whole grains, legumes, nuts, fruits, and vegetables and minimal refined and processed foods    CHOLESTEROL   MOST RECENT LABS: 11/08/21     -TOTAL CHOLESTEROL: 209  -TRIGLYCERIDES: 250  -HDL: 46  -LDL: 125  CURRENT MEDICATION AND DOSING:   None THE GOALS WE HAVE CHOSEN ARE:     -Total Cholesterol goal under 200, Triglycerides goal  under 150, HDL goal above 40, LDL goal under 100   BARRIERS TO ACHIEVING GOALS:   -Diet PLAN TO WORK ON THESE GOALS:   -Diet: high in plant sterols (fruits/ vegetables/ nuts/ whole grains/ legumes). Increase fiber intake (10-25g/day). Avoid foods high in cholesterol (red meat, egg yolks, dairy, oils/ butter). Choose low-fat options.  Specifically increase intake of [place] Nuts (4 per month can help decrease your LDL)    ?Osteoporosis CURRENT REGIMEN AND DOSING:  Vitamin D3 5000 units Daily THE GOALS WE HAVE CHOSEN ARE:   -Reduce fall risk   -Strengthen muscles and bones   PLAN TO WORK ON THESE GOALS:     -Assess home for fall risks, use handrails on stairs and safety bars in bathroom, non-skid mats for the bath tub/ shower   -Exercise: Try as much as possible to complete wheelchair exercises such as raising your legs, raising your arms to increase strength -Take Calcium Citrate 600mg  2 times a day for Bone Strength prevention.   ?    Chronic Kidney Disease   Labs:   GFR 59 on 11/08/21  PLAN TO WORK ON THESE GOALS:     -Maintaining normal BP and BG   -Diet: Decrease salt and fatty foods. Increase fruit and vegetables    CURRENT REGIMEN AND DOSING:   None  THE GOALS WE HAVE CHOSEN ARE:   Continue to monitor SCr, GFR, adjust meds as necessary  Chronic Pain  Current Regimen and Dosing: Tylenol 500mg  3 times a day The Goals we have chosen are: Manage pain Plan to work on these goals: Recommend trying Icy Hot rub to aid with back pain     ?  ACTIVE MEDICATION LIST   Your current medication list has been updated. To view, log in to your patient portal.   Call if any changes need to be made.      MEDICATION REVIEW   MEDICATION REVIEW CONDUCTED:   Yes    DATE:    11/10/2021  BEST POSSIBLE MEDICATION HISTORY  SOURCE:   Medical Records  ??    HOW DO I? - WHEN DO I?     GET AHOLD OF MY DOCTOR?    DURING BUSINESS HOURS WHEN THE OFFICE IS OPEN     PHONE: [phone]  AFTER HOURS [facility] NURSE WHEN THE OFFICE IS CLOSED   PHONE: [phone]   TALK TO [place] CARE COORDINATOR NAME: Newton Pigg   PHONE: [phone]  EMAIL:  [email or web address]  CARE COORDINATOR STAFF   NAME: Rosary Lively, Marlene Bast, Sandria Bales   PHONE: [phone]    NOTE SECTION   Thank you for participating in the Chronic Care Management (CCM) program with Dr. Melford Aase     This program takes a proactive approach to your health and my team will serve as a resource for you throughout the year. Please follow up at [phone] if you have any questions or experience changes to your overall health. Your next CCM appointment will be conducted with Newton Pigg, PharmD as follows:      Date:    05/09/2021     Time:   1:30pm Over the Phone    Rachelle Hora. Jeannett Senior, PharmD  Clinical Pharmacist  [email or web address]  [phone]

## 2021-11-25 DIAGNOSIS — G20 Parkinson's disease: Secondary | ICD-10-CM | POA: Diagnosis not present

## 2021-11-25 DIAGNOSIS — G894 Chronic pain syndrome: Secondary | ICD-10-CM | POA: Diagnosis not present

## 2021-11-25 DIAGNOSIS — I130 Hypertensive heart and chronic kidney disease with heart failure and stage 1 through stage 4 chronic kidney disease, or unspecified chronic kidney disease: Secondary | ICD-10-CM | POA: Diagnosis not present

## 2021-11-25 DIAGNOSIS — N1831 Chronic kidney disease, stage 3a: Secondary | ICD-10-CM | POA: Diagnosis not present

## 2021-11-25 DIAGNOSIS — M8000XS Age-related osteoporosis with current pathological fracture, unspecified site, sequela: Secondary | ICD-10-CM | POA: Diagnosis not present

## 2021-11-25 DIAGNOSIS — I509 Heart failure, unspecified: Secondary | ICD-10-CM | POA: Diagnosis not present

## 2021-11-29 ENCOUNTER — Telehealth: Payer: Self-pay

## 2021-11-29 DIAGNOSIS — G894 Chronic pain syndrome: Secondary | ICD-10-CM | POA: Diagnosis not present

## 2021-11-29 DIAGNOSIS — G20 Parkinson's disease: Secondary | ICD-10-CM | POA: Diagnosis not present

## 2021-11-29 DIAGNOSIS — M8000XS Age-related osteoporosis with current pathological fracture, unspecified site, sequela: Secondary | ICD-10-CM | POA: Diagnosis not present

## 2021-11-29 DIAGNOSIS — N1831 Chronic kidney disease, stage 3a: Secondary | ICD-10-CM | POA: Diagnosis not present

## 2021-11-29 DIAGNOSIS — I509 Heart failure, unspecified: Secondary | ICD-10-CM | POA: Diagnosis not present

## 2021-11-29 DIAGNOSIS — I130 Hypertensive heart and chronic kidney disease with heart failure and stage 1 through stage 4 chronic kidney disease, or unspecified chronic kidney disease: Secondary | ICD-10-CM | POA: Diagnosis not present

## 2021-11-29 NOTE — Telephone Encounter (Signed)
New message   Your information has been sent to [facility].  Herbert Moors (Key: [id number]) Nuplazid 34MG  capsules   Form [facility] Medicare Part D Electronic Prior Authorization Form (2017 NCPDP) Created 2 days ago Sent to Plan 9 minutes ago Plan Response 8 minutes ago Submit Clinical Questions less than a minute ago Determination Wait for Determination Please wait for [facility] Medicare 2017 NCPDP to return a determination.

## 2021-11-30 NOTE — Telephone Encounter (Signed)
F/u  Herbert Moors (Key: [id number]) Nuplazid 34MG  capsules   Form [facility] Medicare Part D Electronic Prior Authorization Form (2017 NCPDP) Created 3 days ago Sent to Plan 22 hours ago Plan Response 22 hours ago Submit Clinical Questions 22 hours ago Determination Favorable 22 hours ago NUPLAZID (pimavanserin) is available at the following 6 specialty pharmacies: - [place]  Message from plan: Request Reference Number: [id number]. NUPLAZID CAP 34MG  is approved through 11/26/2022. Your patient may now fill this prescription and it will be covered.

## 2021-11-30 NOTE — Progress Notes (Signed)
Assessment/Plan:   1.  Parkinsons Disease  -discussed again to take carbidopa/levodopa 25/100 on the following schedule: , 2 at 5am/1 at 7am/2 at 9am/1 at 11am/ 2 at 1pm/ 1 at 3pm/1 at 5pm and patient has added 1 at bedtime.   -Would really like the patient not to walk at all.  Have discussed this for several years.  This would avoid the falls.  -Has declined power wheelchair.    2.  PDD with Parkinson's hallucinations  -She only tried Nuplazid for a week and thought it caused swelling.  That would be an unusual side effect.  3.  Neurogenic Orthostatic Hypotension  -pt rarely using midodrine.  Wonder if she should start taking that.  BP was pretty low in Unk Pinto, MD office and running low at home (63'K systolic).  We talked about trying midodrine at least at breakfast and lunch and keeping an eye on her blood pressure.  She may need to get off the blood pressure medication.  I wonder if that is why she feels lightheaded, dizzy and fatigued.  4.  Constipation  -does well with rancho recipe Subjective:   Alexandra Lindsey was seen today in follow up for Parkinsons disease.  My previous records were reviewed prior to todays visit as well as outside records available to me.   This patient is accompanied in the office by her  husband  who supplements the history.  Last visit, I started Nuplazid again.  Reports that she took it for a week and it made her swell.  States still with hallucinations but not as bad.   Saw primary care on December 13.  Those notes are reviewed.  Continues to have multiple falls (recommendations are not to walk but she has not followed those).  Feels lightheaded and BP running on lower side.  Doesn't want to take the midodrine.  No trouble getting to sleep but some trouble staying asleep.  She naps frequently in the day - husband thinks up to about 3 hours in the day - may nap late - up to 8pm.     Current prescribed movement disorder  medications: Carbidopa/levodopa 25/100,  2 at 5am/1 at 7am/2 at 9am/1 at 11am/ 2 at 1pm/ 1 at 3pm/1 at 5pm (and 1 at bed) Midodrine 10 mg 3 times per day (states that only taking prn based on BP and states that she doesn't need to do it often - lightheaded spell few days/week) Nuplazid, 34 mg daily (started last visit again)  PREVIOUS MEDICATIONS: Pramipexole (stopped because of hallucinations); stalevo; klonopin; rytary; nuplazid (states took it for a week and it made her swell); carbidopa/levodopa 50/200 at bedtime (patient just did not take it long after giving it and stopped for unknown reason)  ALLERGIES:   Allergies  Allergen Reactions   Lyrica [Pregabalin] Rash and Other (See Comments)    Weight gain and swelling in hands, legs and feet   Macrobid [Nitrofurantoin Macrocrystal] Swelling and Rash   Baclofen Other (See Comments)    Kept her awake, affected memory   Cephalosporins Other (See Comments)    Cannot sleep   Codeine Nausea Only    dizziness   Darvocet [Propoxyphene N-Acetaminophen] Nausea And Vomiting   Diclofenac Rash    The gel caused a rash   Levaquin [Levofloxacin] Hives   Penicillins Rash   Selegiline Hcl Rash   Tramadol Nausea And Vomiting   Triamcinolone Itching    CURRENT MEDICATIONS:  Outpatient Encounter Medications as of 12/02/2021  Medication Sig   acetaminophen (TYLENOL) 500 MG tablet Take 1,000 mg by mouth 3 (three) times daily.   albuterol (PROVENTIL HFA;VENTOLIN HFA) 108 (90 Base) MCG/ACT inhaler Inhale 1-2 puffs into the lungs every 6 (six) hours as needed for wheezing or shortness of breath.   carbidopa-levodopa (SINEMET IR) 25-100 MG tablet TAKE 2 TABS AT 5AM, TAKE 1 TAB AT 7AM, TAKE 2 TABS AT 9AM, TAKE 1 TAB AT 11AM, TAKE 2 TABS AT 1PM, TAKE 1 TAB AT 3PM, TAKE 1 TAB AT 5PM AND 1 TAB AT BEDTIME   Cholecalciferol (VITAMIN D3) 5000 units CAPS Take 5,000 Units by mouth daily.   Cranberry-Vitamin C-Probiotic (AZO CRANBERRY) 250-30 MG TABS Take 1 tablet  by mouth daily.   diclofenac Sodium (VOLTAREN) 1 % GEL Apply 4 g topically 4 (four) times daily.   dicyclomine (BENTYL) 20 MG tablet Take  1/2 to 1 tablet   3 x /day  bedfore Meals for abdominal Cramping, Bloating or Diarrhea (Patient taking differently: as needed. Take  1/2 to 1 tablet   3 x /day  bedfore Meals for abdominal Cramping, Bloating or Diarrhea)   hydrochlorothiazide (HYDRODIURIL) 25 MG tablet Take      1 tablet     Daily      For Fluid Retention / Ankle Swelling   methocarbamol (ROBAXIN) 500 MG tablet Take 500 mg by mouth as needed.   midodrine (PROAMATINE) 10 MG tablet Take 1/2-1 tab thee times daily for low blood pressure.   NON FORMULARY Arthritis cream   nystatin (MYCOSTATIN/NYSTOP) powder Apply under breasts 2-3 times a day.   phenazopyridine (PYRIDIUM) 200 MG tablet Take 1 tablet (200 mg total) by mouth 3 (three) times daily as needed for pain.   Probiotic Product (ALIGN) 4 MG CAPS Take 1 capsule by mouth daily.   sertraline (ZOLOFT) 100 MG tablet TAKE 1 & 1/2 TABLETS BY MOUTH AT NIGHT (Patient taking differently: Take 100 mg by mouth at bedtime. TAKE 1 TABLET BY MOUTH AT NIGHT)   vitamin B-12 (CYANOCOBALAMIN) 500 MCG tablet Take 500 mcg by mouth daily.   ondansetron (ZOFRAN-ODT) 4 MG disintegrating tablet Take 4 mg by mouth every 8 (eight) hours as needed for nausea or vomiting.   OVER THE COUNTER MEDICATION Takes Mirlax daily   No facility-administered encounter medications on file as of 12/02/2021.    Objective:   PHYSICAL EXAMINATION:    VITALS:   Vitals:   12/02/21 1342  BP: 123/76  Pulse: (!) 56  SpO2: 95%  Weight: 142 lb (64.4 kg)  Height: 5' (1.524 m)      GEN:  The patient appears stated age and is in NAD. HEENT:  Normocephalic, atraumatic.  The mucous membranes are moist. The superficial temporal arteries are without ropiness or tenderness.   Neurological examination:  Orientation: The patient is alert and oriented x3. Cranial nerves: There is good  facial symmetry with facial hypomimia. The speech is fluent and clear.  She is slightly hypophonic.  Soft palate rises symmetrically and there is no tongue deviation. Hearing is intact to conversational tone. Sensation: Sensation is intact to light touch throughout Motor: Strength is at least antigravity x4.  Movement examination: Tone: There is normal tone in the UE/LE Abnormal movements: No dyskinesia today (and reports not having any at home). Coordination:  There is no decremation in the upper or lower extremities, but testing in the left hand is limited because of tendon/orthopedic issues. Gait and Station: Not tested today (primarily because I really do  not want her walking).  I have reviewed and interpreted the following labs independently    Chemistry      Component Value Date/Time   NA 141 11/08/2021 1027   K 4.4 11/08/2021 1027   CL 108 11/08/2021 1027   CO2 26 11/08/2021 1027   BUN 27 (H) 11/08/2021 1027   CREATININE 0.97 11/08/2021 1027      Component Value Date/Time   CALCIUM 9.6 11/08/2021 1027   ALKPHOS 94 11/17/2016 1154   AST 13 11/08/2021 1027   ALT 4 (L) 11/08/2021 1027   BILITOT 0.4 11/08/2021 1027       Lab Results  Component Value Date   WBC 5.0 11/08/2021   HGB 12.3 11/08/2021   HCT 37.3 11/08/2021   MCV 95.4 11/08/2021   PLT 253 11/08/2021    Lab Results  Component Value Date   TSH 1.03 11/08/2021     Total time spent on today's visit was 31 minutes, including both face-to-face time and nonface-to-face time.  Time included that spent on review of records (prior notes available to me/labs/imaging if pertinent), discussing treatment and goals, answering patient's questions and coordinating care.  Cc:  Unk Pinto, MD

## 2021-12-01 DIAGNOSIS — G894 Chronic pain syndrome: Secondary | ICD-10-CM | POA: Diagnosis not present

## 2021-12-01 DIAGNOSIS — N1831 Chronic kidney disease, stage 3a: Secondary | ICD-10-CM | POA: Diagnosis not present

## 2021-12-01 DIAGNOSIS — I130 Hypertensive heart and chronic kidney disease with heart failure and stage 1 through stage 4 chronic kidney disease, or unspecified chronic kidney disease: Secondary | ICD-10-CM | POA: Diagnosis not present

## 2021-12-01 DIAGNOSIS — M8000XS Age-related osteoporosis with current pathological fracture, unspecified site, sequela: Secondary | ICD-10-CM | POA: Diagnosis not present

## 2021-12-01 DIAGNOSIS — G20 Parkinson's disease: Secondary | ICD-10-CM | POA: Diagnosis not present

## 2021-12-01 DIAGNOSIS — I509 Heart failure, unspecified: Secondary | ICD-10-CM | POA: Diagnosis not present

## 2021-12-02 ENCOUNTER — Encounter: Payer: Self-pay | Admitting: Neurology

## 2021-12-02 ENCOUNTER — Ambulatory Visit (INDEPENDENT_AMBULATORY_CARE_PROVIDER_SITE_OTHER): Payer: Medicare Other | Admitting: Neurology

## 2021-12-02 ENCOUNTER — Other Ambulatory Visit: Payer: Self-pay

## 2021-12-02 VITALS — BP 123/76 | HR 56 | Ht 60.0 in | Wt 142.0 lb

## 2021-12-02 DIAGNOSIS — M8000XS Age-related osteoporosis with current pathological fracture, unspecified site, sequela: Secondary | ICD-10-CM | POA: Diagnosis not present

## 2021-12-02 DIAGNOSIS — R441 Visual hallucinations: Secondary | ICD-10-CM | POA: Diagnosis not present

## 2021-12-02 DIAGNOSIS — G20 Parkinson's disease: Secondary | ICD-10-CM | POA: Diagnosis not present

## 2021-12-02 DIAGNOSIS — I509 Heart failure, unspecified: Secondary | ICD-10-CM | POA: Diagnosis not present

## 2021-12-02 DIAGNOSIS — G894 Chronic pain syndrome: Secondary | ICD-10-CM | POA: Diagnosis not present

## 2021-12-02 DIAGNOSIS — I130 Hypertensive heart and chronic kidney disease with heart failure and stage 1 through stage 4 chronic kidney disease, or unspecified chronic kidney disease: Secondary | ICD-10-CM | POA: Diagnosis not present

## 2021-12-02 DIAGNOSIS — G903 Multi-system degeneration of the autonomic nervous system: Secondary | ICD-10-CM | POA: Diagnosis not present

## 2021-12-02 DIAGNOSIS — N1831 Chronic kidney disease, stage 3a: Secondary | ICD-10-CM | POA: Diagnosis not present

## 2021-12-05 DIAGNOSIS — M8000XS Age-related osteoporosis with current pathological fracture, unspecified site, sequela: Secondary | ICD-10-CM | POA: Diagnosis not present

## 2021-12-05 DIAGNOSIS — G894 Chronic pain syndrome: Secondary | ICD-10-CM | POA: Diagnosis not present

## 2021-12-05 DIAGNOSIS — N1831 Chronic kidney disease, stage 3a: Secondary | ICD-10-CM | POA: Diagnosis not present

## 2021-12-05 DIAGNOSIS — I509 Heart failure, unspecified: Secondary | ICD-10-CM | POA: Diagnosis not present

## 2021-12-05 DIAGNOSIS — G20 Parkinson's disease: Secondary | ICD-10-CM | POA: Diagnosis not present

## 2021-12-05 DIAGNOSIS — I130 Hypertensive heart and chronic kidney disease with heart failure and stage 1 through stage 4 chronic kidney disease, or unspecified chronic kidney disease: Secondary | ICD-10-CM | POA: Diagnosis not present

## 2021-12-07 DIAGNOSIS — I130 Hypertensive heart and chronic kidney disease with heart failure and stage 1 through stage 4 chronic kidney disease, or unspecified chronic kidney disease: Secondary | ICD-10-CM | POA: Diagnosis not present

## 2021-12-07 DIAGNOSIS — G20 Parkinson's disease: Secondary | ICD-10-CM | POA: Diagnosis not present

## 2021-12-07 DIAGNOSIS — N1831 Chronic kidney disease, stage 3a: Secondary | ICD-10-CM | POA: Diagnosis not present

## 2021-12-07 DIAGNOSIS — G894 Chronic pain syndrome: Secondary | ICD-10-CM | POA: Diagnosis not present

## 2021-12-07 DIAGNOSIS — M8000XS Age-related osteoporosis with current pathological fracture, unspecified site, sequela: Secondary | ICD-10-CM | POA: Diagnosis not present

## 2021-12-07 DIAGNOSIS — I509 Heart failure, unspecified: Secondary | ICD-10-CM | POA: Diagnosis not present

## 2021-12-09 DIAGNOSIS — M8000XS Age-related osteoporosis with current pathological fracture, unspecified site, sequela: Secondary | ICD-10-CM | POA: Diagnosis not present

## 2021-12-09 DIAGNOSIS — I130 Hypertensive heart and chronic kidney disease with heart failure and stage 1 through stage 4 chronic kidney disease, or unspecified chronic kidney disease: Secondary | ICD-10-CM | POA: Diagnosis not present

## 2021-12-09 DIAGNOSIS — G20 Parkinson's disease: Secondary | ICD-10-CM | POA: Diagnosis not present

## 2021-12-09 DIAGNOSIS — N1831 Chronic kidney disease, stage 3a: Secondary | ICD-10-CM | POA: Diagnosis not present

## 2021-12-09 DIAGNOSIS — I509 Heart failure, unspecified: Secondary | ICD-10-CM | POA: Diagnosis not present

## 2021-12-09 DIAGNOSIS — G894 Chronic pain syndrome: Secondary | ICD-10-CM | POA: Diagnosis not present

## 2021-12-14 DIAGNOSIS — G894 Chronic pain syndrome: Secondary | ICD-10-CM | POA: Diagnosis not present

## 2021-12-14 DIAGNOSIS — M8000XS Age-related osteoporosis with current pathological fracture, unspecified site, sequela: Secondary | ICD-10-CM | POA: Diagnosis not present

## 2021-12-14 DIAGNOSIS — I509 Heart failure, unspecified: Secondary | ICD-10-CM | POA: Diagnosis not present

## 2021-12-14 DIAGNOSIS — G20 Parkinson's disease: Secondary | ICD-10-CM | POA: Diagnosis not present

## 2021-12-14 DIAGNOSIS — N1831 Chronic kidney disease, stage 3a: Secondary | ICD-10-CM | POA: Diagnosis not present

## 2021-12-14 DIAGNOSIS — I130 Hypertensive heart and chronic kidney disease with heart failure and stage 1 through stage 4 chronic kidney disease, or unspecified chronic kidney disease: Secondary | ICD-10-CM | POA: Diagnosis not present

## 2021-12-16 DIAGNOSIS — M8000XS Age-related osteoporosis with current pathological fracture, unspecified site, sequela: Secondary | ICD-10-CM | POA: Diagnosis not present

## 2021-12-16 DIAGNOSIS — N1831 Chronic kidney disease, stage 3a: Secondary | ICD-10-CM | POA: Diagnosis not present

## 2021-12-16 DIAGNOSIS — I509 Heart failure, unspecified: Secondary | ICD-10-CM | POA: Diagnosis not present

## 2021-12-16 DIAGNOSIS — G20 Parkinson's disease: Secondary | ICD-10-CM | POA: Diagnosis not present

## 2021-12-16 DIAGNOSIS — G894 Chronic pain syndrome: Secondary | ICD-10-CM | POA: Diagnosis not present

## 2021-12-16 DIAGNOSIS — I130 Hypertensive heart and chronic kidney disease with heart failure and stage 1 through stage 4 chronic kidney disease, or unspecified chronic kidney disease: Secondary | ICD-10-CM | POA: Diagnosis not present

## 2021-12-18 DIAGNOSIS — I130 Hypertensive heart and chronic kidney disease with heart failure and stage 1 through stage 4 chronic kidney disease, or unspecified chronic kidney disease: Secondary | ICD-10-CM | POA: Diagnosis not present

## 2021-12-18 DIAGNOSIS — Z7951 Long term (current) use of inhaled steroids: Secondary | ICD-10-CM | POA: Diagnosis not present

## 2021-12-18 DIAGNOSIS — G20 Parkinson's disease: Secondary | ICD-10-CM | POA: Diagnosis not present

## 2021-12-18 DIAGNOSIS — E782 Mixed hyperlipidemia: Secondary | ICD-10-CM | POA: Diagnosis not present

## 2021-12-18 DIAGNOSIS — Z993 Dependence on wheelchair: Secondary | ICD-10-CM | POA: Diagnosis not present

## 2021-12-18 DIAGNOSIS — G4752 REM sleep behavior disorder: Secondary | ICD-10-CM | POA: Diagnosis not present

## 2021-12-18 DIAGNOSIS — M48062 Spinal stenosis, lumbar region with neurogenic claudication: Secondary | ICD-10-CM | POA: Diagnosis not present

## 2021-12-18 DIAGNOSIS — M5116 Intervertebral disc disorders with radiculopathy, lumbar region: Secondary | ICD-10-CM | POA: Diagnosis not present

## 2021-12-18 DIAGNOSIS — I509 Heart failure, unspecified: Secondary | ICD-10-CM | POA: Diagnosis not present

## 2021-12-18 DIAGNOSIS — Z9181 History of falling: Secondary | ICD-10-CM | POA: Diagnosis not present

## 2021-12-18 DIAGNOSIS — M961 Postlaminectomy syndrome, not elsewhere classified: Secondary | ICD-10-CM | POA: Diagnosis not present

## 2021-12-18 DIAGNOSIS — R3981 Functional urinary incontinence: Secondary | ICD-10-CM | POA: Diagnosis not present

## 2021-12-18 DIAGNOSIS — N1831 Chronic kidney disease, stage 3a: Secondary | ICD-10-CM | POA: Diagnosis not present

## 2021-12-18 DIAGNOSIS — K581 Irritable bowel syndrome with constipation: Secondary | ICD-10-CM | POA: Diagnosis not present

## 2021-12-18 DIAGNOSIS — G894 Chronic pain syndrome: Secondary | ICD-10-CM | POA: Diagnosis not present

## 2021-12-18 DIAGNOSIS — R296 Repeated falls: Secondary | ICD-10-CM | POA: Diagnosis not present

## 2021-12-18 DIAGNOSIS — M8000XS Age-related osteoporosis with current pathological fracture, unspecified site, sequela: Secondary | ICD-10-CM | POA: Diagnosis not present

## 2021-12-18 DIAGNOSIS — E559 Vitamin D deficiency, unspecified: Secondary | ICD-10-CM | POA: Diagnosis not present

## 2021-12-18 DIAGNOSIS — K219 Gastro-esophageal reflux disease without esophagitis: Secondary | ICD-10-CM | POA: Diagnosis not present

## 2021-12-18 DIAGNOSIS — R7309 Other abnormal glucose: Secondary | ICD-10-CM | POA: Diagnosis not present

## 2021-12-18 DIAGNOSIS — Z981 Arthrodesis status: Secondary | ICD-10-CM | POA: Diagnosis not present

## 2021-12-21 DIAGNOSIS — I130 Hypertensive heart and chronic kidney disease with heart failure and stage 1 through stage 4 chronic kidney disease, or unspecified chronic kidney disease: Secondary | ICD-10-CM | POA: Diagnosis not present

## 2021-12-21 DIAGNOSIS — G20 Parkinson's disease: Secondary | ICD-10-CM | POA: Diagnosis not present

## 2021-12-21 DIAGNOSIS — I509 Heart failure, unspecified: Secondary | ICD-10-CM | POA: Diagnosis not present

## 2021-12-21 DIAGNOSIS — M8000XS Age-related osteoporosis with current pathological fracture, unspecified site, sequela: Secondary | ICD-10-CM | POA: Diagnosis not present

## 2021-12-21 DIAGNOSIS — G894 Chronic pain syndrome: Secondary | ICD-10-CM | POA: Diagnosis not present

## 2021-12-21 DIAGNOSIS — N1831 Chronic kidney disease, stage 3a: Secondary | ICD-10-CM | POA: Diagnosis not present

## 2021-12-26 DIAGNOSIS — R300 Dysuria: Secondary | ICD-10-CM | POA: Diagnosis not present

## 2021-12-26 DIAGNOSIS — J3489 Other specified disorders of nose and nasal sinuses: Secondary | ICD-10-CM | POA: Diagnosis not present

## 2021-12-26 DIAGNOSIS — N3000 Acute cystitis without hematuria: Secondary | ICD-10-CM | POA: Diagnosis not present

## 2021-12-26 DIAGNOSIS — R350 Frequency of micturition: Secondary | ICD-10-CM | POA: Diagnosis not present

## 2021-12-26 DIAGNOSIS — R059 Cough, unspecified: Secondary | ICD-10-CM | POA: Diagnosis not present

## 2021-12-26 DIAGNOSIS — R519 Headache, unspecified: Secondary | ICD-10-CM | POA: Diagnosis not present

## 2021-12-29 ENCOUNTER — Ambulatory Visit: Payer: Medicare Other | Admitting: Neurology

## 2021-12-30 ENCOUNTER — Telehealth: Payer: Self-pay

## 2021-12-30 NOTE — Telephone Encounter (Signed)
Fall Risk Review Call What recent interventions have been made by any provider to improve the patient's conditions in the last 3 months?: 11/08/21-Pt. presented for 6 month follow up with HTN, HLD, Pre-Diabetes,, Chronic LBP, Parkinson's Dz and Vitamin D Deficiency. No medication changes noted.  12/02/21-[name], Eustace Quail, DO (Neurology)-Pt. was seen for f/u for Parkinson's disease. No medication changes noted.  Any recent hospitalizations or ED visits since last visit with CPP?: No  Adherence Review Adherence rates for STAR metric medications: Rosuvastatin / 90DS / 01/08/20 Hydrochlorothiazide 25mg  / 90DS / 08/30/20 Midodrine 10mg  / 90DS / 30DS / 11/24/19 & 12/20/19 Adherence rates for medications indicated for disease state being reviewed: Carbidopa-levodopa 25-100mg  / 27DS / 10/31/21 & 12/01/21 Does the patient have >5 day gap between last estimated fill dates for any of the above medications?: Yes Reasons for medication gaps: EMR not updated. Pt, taking medications.  Disease State Questions Able to connect with the Patient?: Yes In the past 12 months, have you fallen?: Yes What were the circumstances?: Last fall was last Sunday 12/25/21. Pt. stated she had her walker in hands and slipped, and hit her head. Pt. did not go to doctor. Pt. denied any dizziness or symptoms before , during, or after the fall.   How many times have you fallen?: 3-4x Are there any stairs in or around the home?: No Is the home free of loose throw rugs in walkways, pet beds, electrical cords, etc.?: Yes Is there adequate lighting in your home to reduce the risk of falls?: Yes  HC Chart Review: 10 Minutes  HC Assessment call time spent: 9 Minutes   Clinical Lead Review  Review Adherence gaps identified?: No Drug Therapy Problems identified?: No Assessment: Uncontrolled Plan: Pt unfortunately not adherent to using wheelchair which would help decrease risk of falls. Discussed with patient  osteoporosis and importance of avoiding falls. Discussed using wheelchair and waiting for assistance before getting up on her own. Pt agreeable  Pt also stated she just had COVID virus (diagnosed Monday) and on quarantine. symptoms include coughing and sneezing  Other notes: Rosuvastatin was discontinued 07/16/20  HCTZ: BP running low, pt not taking HCTZ daily just as needed when BP > 140/80. Her recent BP's: 110/51, 115/61, 153/68, 91/55

## 2022-01-03 DIAGNOSIS — N1831 Chronic kidney disease, stage 3a: Secondary | ICD-10-CM | POA: Diagnosis not present

## 2022-01-03 DIAGNOSIS — G894 Chronic pain syndrome: Secondary | ICD-10-CM | POA: Diagnosis not present

## 2022-01-03 DIAGNOSIS — I509 Heart failure, unspecified: Secondary | ICD-10-CM | POA: Diagnosis not present

## 2022-01-03 DIAGNOSIS — G20 Parkinson's disease: Secondary | ICD-10-CM | POA: Diagnosis not present

## 2022-01-03 DIAGNOSIS — I130 Hypertensive heart and chronic kidney disease with heart failure and stage 1 through stage 4 chronic kidney disease, or unspecified chronic kidney disease: Secondary | ICD-10-CM | POA: Diagnosis not present

## 2022-01-03 DIAGNOSIS — M8000XS Age-related osteoporosis with current pathological fracture, unspecified site, sequela: Secondary | ICD-10-CM | POA: Diagnosis not present

## 2022-01-04 DIAGNOSIS — G20 Parkinson's disease: Secondary | ICD-10-CM | POA: Diagnosis not present

## 2022-01-04 DIAGNOSIS — G894 Chronic pain syndrome: Secondary | ICD-10-CM | POA: Diagnosis not present

## 2022-01-04 DIAGNOSIS — I130 Hypertensive heart and chronic kidney disease with heart failure and stage 1 through stage 4 chronic kidney disease, or unspecified chronic kidney disease: Secondary | ICD-10-CM | POA: Diagnosis not present

## 2022-01-04 DIAGNOSIS — M8000XS Age-related osteoporosis with current pathological fracture, unspecified site, sequela: Secondary | ICD-10-CM | POA: Diagnosis not present

## 2022-01-04 DIAGNOSIS — I509 Heart failure, unspecified: Secondary | ICD-10-CM | POA: Diagnosis not present

## 2022-01-04 DIAGNOSIS — N1831 Chronic kidney disease, stage 3a: Secondary | ICD-10-CM | POA: Diagnosis not present

## 2022-01-11 DIAGNOSIS — M8000XS Age-related osteoporosis with current pathological fracture, unspecified site, sequela: Secondary | ICD-10-CM | POA: Diagnosis not present

## 2022-01-11 DIAGNOSIS — I130 Hypertensive heart and chronic kidney disease with heart failure and stage 1 through stage 4 chronic kidney disease, or unspecified chronic kidney disease: Secondary | ICD-10-CM | POA: Diagnosis not present

## 2022-01-11 DIAGNOSIS — G20 Parkinson's disease: Secondary | ICD-10-CM | POA: Diagnosis not present

## 2022-01-11 DIAGNOSIS — G894 Chronic pain syndrome: Secondary | ICD-10-CM | POA: Diagnosis not present

## 2022-01-11 DIAGNOSIS — I509 Heart failure, unspecified: Secondary | ICD-10-CM | POA: Diagnosis not present

## 2022-01-11 DIAGNOSIS — N1831 Chronic kidney disease, stage 3a: Secondary | ICD-10-CM | POA: Diagnosis not present

## 2022-01-12 DIAGNOSIS — I130 Hypertensive heart and chronic kidney disease with heart failure and stage 1 through stage 4 chronic kidney disease, or unspecified chronic kidney disease: Secondary | ICD-10-CM | POA: Diagnosis not present

## 2022-01-12 DIAGNOSIS — G894 Chronic pain syndrome: Secondary | ICD-10-CM | POA: Diagnosis not present

## 2022-01-12 DIAGNOSIS — N1831 Chronic kidney disease, stage 3a: Secondary | ICD-10-CM | POA: Diagnosis not present

## 2022-01-12 DIAGNOSIS — G20 Parkinson's disease: Secondary | ICD-10-CM | POA: Diagnosis not present

## 2022-01-12 DIAGNOSIS — M8000XS Age-related osteoporosis with current pathological fracture, unspecified site, sequela: Secondary | ICD-10-CM | POA: Diagnosis not present

## 2022-01-12 DIAGNOSIS — I509 Heart failure, unspecified: Secondary | ICD-10-CM | POA: Diagnosis not present

## 2022-01-17 DIAGNOSIS — I509 Heart failure, unspecified: Secondary | ICD-10-CM | POA: Diagnosis not present

## 2022-01-17 DIAGNOSIS — R7309 Other abnormal glucose: Secondary | ICD-10-CM | POA: Diagnosis not present

## 2022-01-17 DIAGNOSIS — G894 Chronic pain syndrome: Secondary | ICD-10-CM | POA: Diagnosis not present

## 2022-01-17 DIAGNOSIS — K581 Irritable bowel syndrome with constipation: Secondary | ICD-10-CM | POA: Diagnosis not present

## 2022-01-17 DIAGNOSIS — I130 Hypertensive heart and chronic kidney disease with heart failure and stage 1 through stage 4 chronic kidney disease, or unspecified chronic kidney disease: Secondary | ICD-10-CM | POA: Diagnosis not present

## 2022-01-17 DIAGNOSIS — M8000XS Age-related osteoporosis with current pathological fracture, unspecified site, sequela: Secondary | ICD-10-CM | POA: Diagnosis not present

## 2022-01-17 DIAGNOSIS — N1831 Chronic kidney disease, stage 3a: Secondary | ICD-10-CM | POA: Diagnosis not present

## 2022-01-17 DIAGNOSIS — G4752 REM sleep behavior disorder: Secondary | ICD-10-CM | POA: Diagnosis not present

## 2022-01-17 DIAGNOSIS — G20 Parkinson's disease: Secondary | ICD-10-CM | POA: Diagnosis not present

## 2022-01-17 DIAGNOSIS — M48062 Spinal stenosis, lumbar region with neurogenic claudication: Secondary | ICD-10-CM | POA: Diagnosis not present

## 2022-01-17 DIAGNOSIS — R296 Repeated falls: Secondary | ICD-10-CM | POA: Diagnosis not present

## 2022-01-17 DIAGNOSIS — M961 Postlaminectomy syndrome, not elsewhere classified: Secondary | ICD-10-CM | POA: Diagnosis not present

## 2022-01-17 DIAGNOSIS — E782 Mixed hyperlipidemia: Secondary | ICD-10-CM | POA: Diagnosis not present

## 2022-01-17 DIAGNOSIS — Z993 Dependence on wheelchair: Secondary | ICD-10-CM | POA: Diagnosis not present

## 2022-01-17 DIAGNOSIS — E559 Vitamin D deficiency, unspecified: Secondary | ICD-10-CM | POA: Diagnosis not present

## 2022-01-17 DIAGNOSIS — R3981 Functional urinary incontinence: Secondary | ICD-10-CM | POA: Diagnosis not present

## 2022-01-17 DIAGNOSIS — Z9181 History of falling: Secondary | ICD-10-CM | POA: Diagnosis not present

## 2022-01-17 DIAGNOSIS — Z7951 Long term (current) use of inhaled steroids: Secondary | ICD-10-CM | POA: Diagnosis not present

## 2022-01-17 DIAGNOSIS — Z981 Arthrodesis status: Secondary | ICD-10-CM | POA: Diagnosis not present

## 2022-01-17 DIAGNOSIS — K219 Gastro-esophageal reflux disease without esophagitis: Secondary | ICD-10-CM | POA: Diagnosis not present

## 2022-01-17 DIAGNOSIS — M5116 Intervertebral disc disorders with radiculopathy, lumbar region: Secondary | ICD-10-CM | POA: Diagnosis not present

## 2022-01-19 DIAGNOSIS — M8000XS Age-related osteoporosis with current pathological fracture, unspecified site, sequela: Secondary | ICD-10-CM | POA: Diagnosis not present

## 2022-01-19 DIAGNOSIS — I130 Hypertensive heart and chronic kidney disease with heart failure and stage 1 through stage 4 chronic kidney disease, or unspecified chronic kidney disease: Secondary | ICD-10-CM | POA: Diagnosis not present

## 2022-01-19 DIAGNOSIS — N1831 Chronic kidney disease, stage 3a: Secondary | ICD-10-CM | POA: Diagnosis not present

## 2022-01-19 DIAGNOSIS — I509 Heart failure, unspecified: Secondary | ICD-10-CM | POA: Diagnosis not present

## 2022-01-19 DIAGNOSIS — G894 Chronic pain syndrome: Secondary | ICD-10-CM | POA: Diagnosis not present

## 2022-01-19 DIAGNOSIS — G20 Parkinson's disease: Secondary | ICD-10-CM | POA: Diagnosis not present

## 2022-01-24 DIAGNOSIS — I10 Essential (primary) hypertension: Secondary | ICD-10-CM | POA: Diagnosis not present

## 2022-01-24 DIAGNOSIS — I130 Hypertensive heart and chronic kidney disease with heart failure and stage 1 through stage 4 chronic kidney disease, or unspecified chronic kidney disease: Secondary | ICD-10-CM | POA: Diagnosis not present

## 2022-01-24 DIAGNOSIS — G894 Chronic pain syndrome: Secondary | ICD-10-CM | POA: Diagnosis not present

## 2022-01-24 DIAGNOSIS — E782 Mixed hyperlipidemia: Secondary | ICD-10-CM | POA: Diagnosis not present

## 2022-01-24 DIAGNOSIS — I509 Heart failure, unspecified: Secondary | ICD-10-CM | POA: Diagnosis not present

## 2022-01-24 DIAGNOSIS — M8000XS Age-related osteoporosis with current pathological fracture, unspecified site, sequela: Secondary | ICD-10-CM | POA: Diagnosis not present

## 2022-01-24 DIAGNOSIS — N1831 Chronic kidney disease, stage 3a: Secondary | ICD-10-CM | POA: Diagnosis not present

## 2022-01-24 DIAGNOSIS — G20 Parkinson's disease: Secondary | ICD-10-CM | POA: Diagnosis not present

## 2022-01-25 DIAGNOSIS — Z20822 Contact with and (suspected) exposure to covid-19: Secondary | ICD-10-CM | POA: Diagnosis not present

## 2022-01-26 DIAGNOSIS — I509 Heart failure, unspecified: Secondary | ICD-10-CM | POA: Diagnosis not present

## 2022-01-26 DIAGNOSIS — G894 Chronic pain syndrome: Secondary | ICD-10-CM | POA: Diagnosis not present

## 2022-01-26 DIAGNOSIS — I130 Hypertensive heart and chronic kidney disease with heart failure and stage 1 through stage 4 chronic kidney disease, or unspecified chronic kidney disease: Secondary | ICD-10-CM | POA: Diagnosis not present

## 2022-01-26 DIAGNOSIS — G20 Parkinson's disease: Secondary | ICD-10-CM | POA: Diagnosis not present

## 2022-01-26 DIAGNOSIS — M8000XS Age-related osteoporosis with current pathological fracture, unspecified site, sequela: Secondary | ICD-10-CM | POA: Diagnosis not present

## 2022-01-26 DIAGNOSIS — N1831 Chronic kidney disease, stage 3a: Secondary | ICD-10-CM | POA: Diagnosis not present

## 2022-02-02 DIAGNOSIS — M8000XS Age-related osteoporosis with current pathological fracture, unspecified site, sequela: Secondary | ICD-10-CM | POA: Diagnosis not present

## 2022-02-02 DIAGNOSIS — G20 Parkinson's disease: Secondary | ICD-10-CM | POA: Diagnosis not present

## 2022-02-02 DIAGNOSIS — I130 Hypertensive heart and chronic kidney disease with heart failure and stage 1 through stage 4 chronic kidney disease, or unspecified chronic kidney disease: Secondary | ICD-10-CM | POA: Diagnosis not present

## 2022-02-02 DIAGNOSIS — I509 Heart failure, unspecified: Secondary | ICD-10-CM | POA: Diagnosis not present

## 2022-02-02 DIAGNOSIS — G894 Chronic pain syndrome: Secondary | ICD-10-CM | POA: Diagnosis not present

## 2022-02-02 DIAGNOSIS — N1831 Chronic kidney disease, stage 3a: Secondary | ICD-10-CM | POA: Diagnosis not present

## 2022-02-09 DIAGNOSIS — I130 Hypertensive heart and chronic kidney disease with heart failure and stage 1 through stage 4 chronic kidney disease, or unspecified chronic kidney disease: Secondary | ICD-10-CM | POA: Diagnosis not present

## 2022-02-09 DIAGNOSIS — G20 Parkinson's disease: Secondary | ICD-10-CM | POA: Diagnosis not present

## 2022-02-09 DIAGNOSIS — M8000XS Age-related osteoporosis with current pathological fracture, unspecified site, sequela: Secondary | ICD-10-CM | POA: Diagnosis not present

## 2022-02-09 DIAGNOSIS — G894 Chronic pain syndrome: Secondary | ICD-10-CM | POA: Diagnosis not present

## 2022-02-09 DIAGNOSIS — I509 Heart failure, unspecified: Secondary | ICD-10-CM | POA: Diagnosis not present

## 2022-02-09 DIAGNOSIS — N1831 Chronic kidney disease, stage 3a: Secondary | ICD-10-CM | POA: Diagnosis not present

## 2022-02-10 DIAGNOSIS — M8000XS Age-related osteoporosis with current pathological fracture, unspecified site, sequela: Secondary | ICD-10-CM | POA: Diagnosis not present

## 2022-02-10 DIAGNOSIS — G894 Chronic pain syndrome: Secondary | ICD-10-CM | POA: Diagnosis not present

## 2022-02-10 DIAGNOSIS — G20 Parkinson's disease: Secondary | ICD-10-CM | POA: Diagnosis not present

## 2022-02-10 DIAGNOSIS — N1831 Chronic kidney disease, stage 3a: Secondary | ICD-10-CM | POA: Diagnosis not present

## 2022-02-10 DIAGNOSIS — I509 Heart failure, unspecified: Secondary | ICD-10-CM | POA: Diagnosis not present

## 2022-02-10 DIAGNOSIS — I130 Hypertensive heart and chronic kidney disease with heart failure and stage 1 through stage 4 chronic kidney disease, or unspecified chronic kidney disease: Secondary | ICD-10-CM | POA: Diagnosis not present

## 2022-02-13 DIAGNOSIS — N1831 Chronic kidney disease, stage 3a: Secondary | ICD-10-CM | POA: Diagnosis not present

## 2022-02-13 DIAGNOSIS — G20 Parkinson's disease: Secondary | ICD-10-CM | POA: Diagnosis not present

## 2022-02-13 DIAGNOSIS — G894 Chronic pain syndrome: Secondary | ICD-10-CM | POA: Diagnosis not present

## 2022-02-13 DIAGNOSIS — I130 Hypertensive heart and chronic kidney disease with heart failure and stage 1 through stage 4 chronic kidney disease, or unspecified chronic kidney disease: Secondary | ICD-10-CM | POA: Diagnosis not present

## 2022-02-13 DIAGNOSIS — M8000XS Age-related osteoporosis with current pathological fracture, unspecified site, sequela: Secondary | ICD-10-CM | POA: Diagnosis not present

## 2022-02-13 DIAGNOSIS — I509 Heart failure, unspecified: Secondary | ICD-10-CM | POA: Diagnosis not present

## 2022-03-14 DIAGNOSIS — Z20822 Contact with and (suspected) exposure to covid-19: Secondary | ICD-10-CM | POA: Diagnosis not present

## 2022-03-16 DIAGNOSIS — Z20822 Contact with and (suspected) exposure to covid-19: Secondary | ICD-10-CM | POA: Diagnosis not present

## 2022-04-05 DIAGNOSIS — Z20822 Contact with and (suspected) exposure to covid-19: Secondary | ICD-10-CM | POA: Diagnosis not present

## 2022-04-10 ENCOUNTER — Other Ambulatory Visit: Payer: Self-pay | Admitting: Neurology

## 2022-04-10 DIAGNOSIS — G20 Parkinson's disease: Secondary | ICD-10-CM

## 2022-05-01 ENCOUNTER — Encounter: Payer: Self-pay | Admitting: Internal Medicine

## 2022-05-01 NOTE — Patient Instructions (Signed)

## 2022-05-01 NOTE — Progress Notes (Unsigned)
Comprehensive Evaluation &  Examination  Future Appointments  Date Time Provider Department  05/02/2022                 CPE  2:00 PM Unk Pinto, MD [facility]  05/09/2022  1:30 PM Carleene Mains, [facility]  07/18/2022  1:00 PM [name], Eustace Quail, DO [facility]  08/16/2022               Wellness   3:30 PM  [facility]  05/08/2023                CPE  2:00 PM Unk Pinto, MD [facility]        This very nice 81 y.o. MWF  presents for a comprehensive evaluation and management of multiple medical co-morbidities.  Patient has been followed for HTN and neurogenic Orthostatic Hypotension, HLD, Pre-Diabetes, Parkinson's Disease and Vitamin D Deficiency.                     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  Patient has had improvement in her LBP on Lyrica.  Patient has advanced Parkinson's Dz (2002)  & is followed closely by Dr [name].  Patient  has an  unstable gait & has hx/o multiple falls and has been advised not to attempt ambulation.  Dr [name] attempts to manage patient's Parkinson's meds, but apparently patient is very poorly compliant.          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  Patient's BP has been controlled at home and patient denies any cardiac symptoms as chest pain, palpitations, shortness of breath  or ankle swelling.  In 2013, she had a Negative Heart Cath. Today's BP is at goal - 112/72.        Patient's hyperlipidemia is not controlled with diet and medications. Patient denies myalgias or other medication SE's. Last lipids were not at goal albeit elevated Trig's:  Lab Results  Component Value Date   CHOL 209 (H) 11/08/2021   HDL 46 (L) 11/08/2021   LDLCALC 125 (H) 11/08/2021   TRIG 250 (H) 11/08/2021   CHOLHDL 4.5 11/08/2021         Patient has hx/o PreDiabetes  (A1c 6.3% /2011 and 5.9%/2013) & is monitored for glucose intolerance and patient  denies reactive hypoglycemic symptoms, visual blurring, diabetic polys or paresthesias. Last A1c was normal & at goal:  Lab Results  Component Value Date   HGBA1C 5.4 11/08/2021        Finally, patient has history of Vitamin D Deficiency ("23" /2008) and last Vitamin D was at goal:  Lab Results  Component Value Date   VD25OH 85 11/08/2021       Current Outpatient Medications on File Prior to Visit  Medication Sig   acetaminophen  500 MG tablet Take 1,000 mg  3  times daily.   albuterol  HFA inhaler Inhale 1-2 puffs  every 6 hours as needed for wheezing    SINEMET CR 50-200 MG tablet Take 1 tablet by mouth at bedtime.   SINEMET IR 25-100 MG tablet TAKE 2 TABS AT 5AM, TAKE 1 TAB AT 7AM, TAKE 2 TABS AT 9AM, TAKE 1 TAB AT 11AM, TAKE 2 TABS AT 1PM, TAKE 1 TAB AT 3PM, TAKE 1 TAB AT 5PM   VITAMIN D 5000 units  Take daily.   Cranberry-Vitamin C-Probiotic (AZO  CRANBERRY) 250-30 MG TABS Take 1 tablet daily.   hydrochlorothiazide 25 MG tablet Take 1 tablet Daily     methocarbamol 500 MG tablet Take as needed   midodrine  10 MG tablet Take 1/2-1 tab thee times daily for low blood pressure.   NON FORMULARY Arthritis cream   nystatin powder Apply under breasts 2-3 times a day as needed .   ondansetron-ODT 4 MG  Take 4 mg  every 8  hours as needed for nausea or vomiting.    Mirlax Takes daily   Probiotic / ALIGN 4 MG CAPS Take 1 capsule  daily.   sertraline  100 MG tablet Takes 1 tablet at night   vitamin B-12  500 MCG tablet Take  daily.     Allergies  Allergen Reactions   Lyrica [Pregabalin] Rash and Other (See Comments)    Weight gain and swelling in hands, legs and feet   Macrobid [Nitrofurantoin Macrocrystal] Swelling and Rash   Baclofen Other (See Comments)    Kept her awake, affected memory   Cephalosporins Other (See Comments)    Cannot sleep   Codeine Nausea Only    dizziness   Darvocet [Propoxyphene N-Acetaminophen] Nausea And Vomiting   Diclofenac Rash    The gel caused a  rash   Levaquin [Levofloxacin] Hives   Penicillins Rash   Selegiline Hcl Rash   Tramadol Nausea And Vomiting   Triamcinolone Itching     Past Medical History:  Diagnosis Date   Anemia    as young girl and teenager   Arthritis    Chest pain    Constipation    Costochondritis    GERD     Headache    ocular migraines   History of hiatal hernia    Hyperlipidemia 10-07-12   Never smoked tobacco    Parkinson's disease ([place]) 10-07-12   Prediabetes    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   Spinal stenosis      Health Maintenance  Topic Date Due   Pneumococcal Vaccine 11-33 Years old (1 of 4 - PCV13) Never done   Hepatitis C Screening  Never done   Zoster Vaccines- Shingrix (1 of 2) Never done   COVID-19 Vaccine (3 - Booster for Pfizer series) 06/21/2020   INFLUENZA VACCINE  06/27/2021   TETANUS/TDAP  09/09/2023   DEXA SCAN  Completed   PNA vac Low Risk Adult  Completed   HPV VACCINES  Aged Out     Immunization History  Administered Date(s) Administered   Influenza, High Dose   09/08/2014, 10/04/2015, 09/02/2019, 08/30/2020   Influenza  09/29/2016, 08/08/2017, 08/22/2018   PFIZER SARS-COV-2 Vacc 12/22/2019, 01/23/2020   Pneumococcal  - 13 11/10/2015   Pneumococcal  - 23 02/26/2017   Pneumococcal  - 23 11/27/2006   Td 09/08/2013   Zoster, Live 06/27/2006    Colon  - 06/28/1993 - Dr Teena Irani  - Rectal Polyp villous adenoma Colon   - 02/2014 - Diverticulosis  Cologard - 03/26/2017 - Positive - referred back to Dr Amedeo Plenty  -  11/11/2019 - Seen consult by Dr Wilford Corner & recommended a colonoscopy    EGD - 05/02/2010 -  Dr Teena Irani    Last MGM - 10/09/2013    Past Surgical History:  Procedure Laterality Date   ABDOMINAL HYSTERECTOMY     APPENDECTOMY     BACK SURGERY  12/20/2015   BACK SURGERY  09/27/2017   BACK SURGERY  2020   BUNIONECTOMY Bilateral 11-11-3  CARDIAC CATHETERIZATION Bilateral 2013   COLONOSCOPY     HERNIA REPAIR     LEFT HEART  CATHETERIZATION WITH CORONARY ANGIOGRAM N/A 07/03/2012   Procedure: LEFT HEART CATHETERIZATION WITH CORONARY ANGIOGRAM;  Surgeon: Troy Sine, MD;  Location: [facility] CATH LAB;  Service: Cardiovascular;  Laterality: N/A;   REPAIR OF CEREBROSPINAL FLUID LEAK N/A 12/24/2015   Procedure: LUMBAR WOUND EXPLORATION, REPAIR OF CEREBROSPINAL FLUID LEAK, PLACEMENT OF LUMBAR DRAIN;  Surgeon: Kevan Ny Ditty, MD;  Location: [facility];  Service: Neurosurgery;  Laterality: N/A;   ROTATOR CUFF REPAIR Left 10-07-12     Family History  Problem Relation Age of Onset   Heart attack Mother    Esophageal cancer Mother    Suicidality Father        suicide    Suicidality Brother        suicide    CVA Brother    Hypertension Brother    Hypercholesterolemia Brother    Mental retardation Brother      Social History   Tobacco Use   Smoking status: Never Smoker   Smokeless tobacco: Never Used  Scientific laboratory technician Use: Never used  Substance Use Topics   Alcohol use: No    Alcohol/week: 0.0 standard drinks   Drug use: No      ROS Constitutional: Denies fever, chills, weight loss/gain, headaches, insomnia,  night sweats, and change in appetite. Does c/o fatigue. Eyes: Denies redness, blurred vision, diplopia, discharge, itchy, watery eyes.  ENT: Denies discharge, congestion, post nasal drip, epistaxis, sore throat, earache, hearing loss, dental pain, Tinnitus, Vertigo, Sinus pain, snoring.  Cardio: Denies chest pain, palpitations, irregular heartbeat, syncope, dyspnea, diaphoresis, orthopnea, PND, claudication, edema Respiratory: denies cough, dyspnea, DOE, pleurisy, hoarseness, laryngitis, wheezing.  Gastrointestinal: Denies dysphagia, heartburn, reflux, water brash, pain, cramps, nausea, vomiting, bloating, diarrhea, constipation, hematemesis, melena, hematochezia, jaundice, hemorrhoids Genitourinary: Denies dysuria, frequency, urgency, nocturia, hesitancy, discharge, hematuria, flank pain Breast:  Breast lumps, nipple discharge, bleeding.  Musculoskeletal: Denies arthralgia, myalgia, stiffness, Jt. Swelling, pain, limp, and strain/sprain. Denies falls. Skin: Denies puritis, rash, hives, warts, acne, eczema, changing in skin lesion Neuro: No weakness, tremor, incoordination, spasms, paresthesia, pain Psychiatric: Denies confusion, memory loss, sensory loss. Denies Depression. Endocrine: Denies change in weight, skin, hair change, nocturia, and paresthesia, diabetic polys, visual blurring, hyper / hypo glycemic episodes.  Heme/Lymph: No excessive bleeding, bruising, enlarged lymph nodes.  Physical Exam  There were no vitals taken for this visit.  General Appearance: Well nourished, well groomed and in no apparent distress.  Eyes: PERRLA, EOMs, conjunctiva no swelling or erythema, normal fundi and vessels. Sinuses: No frontal/maxillary tenderness ENT/Mouth: EACs patent / TMs  nl. Nares clear without erythema, swelling, mucoid exudates. Oral hygiene is good. No erythema, swelling, or exudate. Tongue normal, non-obstructing. Tonsils not swollen or erythematous. Hearing normal.  Neck: Supple, thyroid not palpable. No bruits, nodes or JVD. Respiratory: Respiratory effort normal.  BS equal and clear bilateral without rales, rhonci, wheezing or stridor. Cardio: Heart sounds are normal with regular rate and rhythm and no murmurs, rubs or gallops. Peripheral pulses are normal and equal bilaterally without edema. No aortic or femoral bruits. Chest: symmetric with normal excursions and percussion. Breasts: Symmetric, without lumps, nipple discharge, retractions, or fibrocystic changes. Candidiasis rash beneath breasts.  Abdomen: protuberant, soft with bowel sounds active. Nontender, no guarding, rebound, hernias, masses or organomegaly.  Lymphatics: Non tender without lymphadenopathy.  Musculoskeletal:  FROM w/o deformity.  Nl muscle tone . No cog wheeling.  Lateral splaying of  Fingers / MCP jts -  Lt>>Rt with Heberden's / Bouchard's  nodes.  Patient is in a wheelchair.  Skin: Warm and dry without rashes, lesions, cyanosis, clubbing or  ecchymosis.  Neuro: Masked facies.Cranial nerves intact, reflexes equal bilaterally. Normal muscle tone, no cerebellar symptoms. Sensation intact.  Pysch: Alert and oriented X 3, normal affect, Insight and Judgment appropriate.    Assessment and Plan  1. Labile hypertension  - EKG 12-Lead - Urinalysis, Routine w reflex microscopic - CBC with Differential/Platelet - COMPLETE METABOLIC PANEL WITH GFR - Magnesium - TSH  2. Idiopathic hypotension  - EKG 12-Lead  3. Hyperlipidemia, mixed  - EKG 12-Lead - Lipid panel - TSH  4. Abnormal glucose  - EKG 12-Lead - Hemoglobin A1c - Insulin, random  5. Vitamin D deficiency  - VITAMIN D 25 Hydroxy   6. Parkinson disease ([place])   7. Unstable gait   8. At high risk for falls   9. Chronic pain syndrome   10. Age-related osteoporosis   - COMPLETE METABOLIC PANEL WITH GFR  11. Screening for ischemic heart disease  - EKG 12-Lead  12. FHx: heart disease  - EKG 12-Lead  13. Medication management  - Urinalysis, Routine w reflex microscopic - Microalbumin / creatinine urine ratio - CBC with Differential/Platelet - COMPLETE METABOLIC PANEL WITH GFR - Magnesium - Lipid panel - TSH - Hemoglobin A1c - Insulin, random - VITAMIN D 25 Hydroxy   14. Screening for colorectal cancer  - POC Hemoccult Bld/Stl          Patient was counseled in prudent diet to achieve/maintain BMI less than 25 for weight control, BP monitoring, regular exercise and medications. Discussed med's effects and SE's. Screening labs and tests as requested with regular follow-up as recommended. Over 40 minutes of exam, counseling, chart review and high complex critical decision making was performed.   Kirtland Bouchard, MD

## 2022-05-02 ENCOUNTER — Ambulatory Visit (INDEPENDENT_AMBULATORY_CARE_PROVIDER_SITE_OTHER): Payer: Medicare Other | Admitting: Internal Medicine

## 2022-05-02 ENCOUNTER — Encounter: Payer: Self-pay | Admitting: Internal Medicine

## 2022-05-02 VITALS — BP 108/65 | HR 85 | Temp 96.9°F | Wt 142.0 lb

## 2022-05-02 DIAGNOSIS — G20 Parkinson's disease: Secondary | ICD-10-CM

## 2022-05-02 DIAGNOSIS — Z8249 Family history of ischemic heart disease and other diseases of the circulatory system: Secondary | ICD-10-CM | POA: Diagnosis not present

## 2022-05-02 DIAGNOSIS — R0989 Other specified symptoms and signs involving the circulatory and respiratory systems: Secondary | ICD-10-CM

## 2022-05-02 DIAGNOSIS — Z79899 Other long term (current) drug therapy: Secondary | ICD-10-CM

## 2022-05-02 DIAGNOSIS — M8000XS Age-related osteoporosis with current pathological fracture, unspecified site, sequela: Secondary | ICD-10-CM

## 2022-05-02 DIAGNOSIS — R7309 Other abnormal glucose: Secondary | ICD-10-CM | POA: Diagnosis not present

## 2022-05-02 DIAGNOSIS — Z136 Encounter for screening for cardiovascular disorders: Secondary | ICD-10-CM | POA: Diagnosis not present

## 2022-05-02 DIAGNOSIS — I950 Idiopathic hypotension: Secondary | ICD-10-CM

## 2022-05-02 DIAGNOSIS — E559 Vitamin D deficiency, unspecified: Secondary | ICD-10-CM | POA: Diagnosis not present

## 2022-05-02 DIAGNOSIS — E782 Mixed hyperlipidemia: Secondary | ICD-10-CM

## 2022-05-02 DIAGNOSIS — G20A1 Parkinson's disease without dyskinesia, without mention of fluctuations: Secondary | ICD-10-CM

## 2022-05-02 DIAGNOSIS — Z1211 Encounter for screening for malignant neoplasm of colon: Secondary | ICD-10-CM

## 2022-05-02 DIAGNOSIS — R2681 Unsteadiness on feet: Secondary | ICD-10-CM

## 2022-05-02 DIAGNOSIS — G894 Chronic pain syndrome: Secondary | ICD-10-CM

## 2022-05-02 DIAGNOSIS — Z9181 History of falling: Secondary | ICD-10-CM

## 2022-05-03 ENCOUNTER — Other Ambulatory Visit: Payer: Self-pay | Admitting: Internal Medicine

## 2022-05-03 DIAGNOSIS — N3000 Acute cystitis without hematuria: Secondary | ICD-10-CM

## 2022-05-03 DIAGNOSIS — N1832 Chronic kidney disease, stage 3b: Secondary | ICD-10-CM

## 2022-05-03 DIAGNOSIS — N179 Acute kidney failure, unspecified: Secondary | ICD-10-CM

## 2022-05-03 LAB — CBC WITH DIFFERENTIAL/PLATELET
Absolute Monocytes: 505 cells/uL (ref 200–950)
Basophils Absolute: 31 cells/uL (ref 0–200)
Basophils Relative: 0.6 %
Eosinophils Absolute: 112 cells/uL (ref 15–500)
Eosinophils Relative: 2.2 %
HCT: 36.4 % (ref 35.0–45.0)
Hemoglobin: 11.8 g/dL (ref 11.7–15.5)
Lymphs Abs: 1311 cells/uL (ref 850–3900)
MCH: 30.3 pg (ref 27.0–33.0)
MCHC: 32.4 g/dL (ref 32.0–36.0)
MCV: 93.6 fL (ref 80.0–100.0)
MPV: 11.1 fL (ref 7.5–12.5)
Monocytes Relative: 9.9 %
Neutro Abs: 3142 cells/uL (ref 1500–7800)
Neutrophils Relative %: 61.6 %
Platelets: 237 10*3/uL (ref 140–400)
RBC: 3.89 10*6/uL (ref 3.80–5.10)
RDW: 13 % (ref 11.0–15.0)
Total Lymphocyte: 25.7 %
WBC: 5.1 10*3/uL (ref 3.8–10.8)

## 2022-05-03 LAB — HEMOGLOBIN A1C
Hgb A1c MFr Bld: 5.5 % of total Hgb (ref <5.7)
Mean Plasma Glucose: 111 mg/dL
eAG (mmol/L): 6.2 mmol/L

## 2022-05-03 LAB — COMPLETE METABOLIC PANEL WITH GFR
AG Ratio: 2.4 (calc) (ref 1.0–2.5)
ALT: 3 U/L — ABNORMAL LOW (ref 6–29)
AST: 12 U/L (ref 10–35)
Albumin: 4.4 g/dL (ref 3.6–5.1)
Alkaline phosphatase (APISO): 88 U/L (ref 37–153)
BUN/Creatinine Ratio: 23 (calc) — ABNORMAL HIGH (ref 6–22)
BUN: 30 mg/dL — ABNORMAL HIGH (ref 7–25)
CO2: 27 mmol/L (ref 20–32)
Calcium: 9.7 mg/dL (ref 8.6–10.4)
Chloride: 109 mmol/L (ref 98–110)
Creat: 1.29 mg/dL — ABNORMAL HIGH (ref 0.60–0.95)
Globulin: 1.8 g/dL (calc) — ABNORMAL LOW (ref 1.9–3.7)
Glucose, Bld: 91 mg/dL (ref 65–99)
Potassium: 4.3 mmol/L (ref 3.5–5.3)
Sodium: 142 mmol/L (ref 135–146)
Total Bilirubin: 0.3 mg/dL (ref 0.2–1.2)
Total Protein: 6.2 g/dL (ref 6.1–8.1)
eGFR: 42 mL/min/{1.73_m2} — ABNORMAL LOW (ref >=60)

## 2022-05-03 LAB — URINALYSIS, ROUTINE W REFLEX MICROSCOPIC
Bilirubin Urine: NEGATIVE
Glucose, UA: NEGATIVE
Hgb urine dipstick: NEGATIVE
Nitrite: POSITIVE — AB
RBC / HPF: NONE SEEN /HPF (ref 0–2)
Specific Gravity, Urine: 1.041 — ABNORMAL HIGH (ref 1.001–1.035)
pH: <=5 (ref 5.0–8.0)

## 2022-05-03 LAB — LIPID PANEL
Cholesterol: 229 mg/dL — ABNORMAL HIGH (ref <200)
HDL: 55 mg/dL (ref >=50)
LDL Cholesterol (Calc): 144 mg/dL (calc) — ABNORMAL HIGH
Non-HDL Cholesterol (Calc): 174 mg/dL (calc) — ABNORMAL HIGH (ref <130)
Total CHOL/HDL Ratio: 4.2 (calc) (ref <5.0)
Triglycerides: 160 mg/dL — ABNORMAL HIGH (ref <150)

## 2022-05-03 LAB — VITAMIN D 25 HYDROXY (VIT D DEFICIENCY, FRACTURES): Vit D, 25-Hydroxy: 83 ng/mL (ref 30–100)

## 2022-05-03 LAB — MICROALBUMIN / CREATININE URINE RATIO
Creatinine, Urine: 232 mg/dL (ref 20–275)
Microalb Creat Ratio: 9 mcg/mg creat (ref <30)
Microalb, Ur: 2.2 mg/dL

## 2022-05-03 LAB — MAGNESIUM: Magnesium: 2.2 mg/dL (ref 1.5–2.5)

## 2022-05-03 LAB — TSH: TSH: 0.93 mIU/L (ref 0.40–4.50)

## 2022-05-03 LAB — INSULIN, RANDOM: Insulin: 13.6 u[IU]/mL

## 2022-05-03 LAB — MICROSCOPIC MESSAGE

## 2022-05-03 MED ORDER — ROSUVASTATIN CALCIUM 20 MG PO TABS: ORAL_TABLET | ORAL | 3 refills | Status: DC

## 2022-05-03 NOTE — Progress Notes (Signed)
<><><><><><><><><><><><><><><><><><><><><><><><><><><><><><><><><> <><><><><><><><><><><><><><><><><><><><><><><><><><><><><><><><><>  -  U/A very suspicious for UTI Infection                                                                                                                                                                                                                                                                                                                                                                                                                                                                               -Sent specimen for culture & may take 3 to 5 days to get final results <><><><><><><><><><><><><><><><><><><><><><><><><><><><><><><><><>  -  Kidney functions appear worse                        - 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  )  & -  Bad / Dangerous LDL  Chol = 144   - also way too high Elevated                            (  Ideal  or  Goal is less than 70  !  )   - Very high risk for Heart Attack / Stroke- So . . . . . [name]                                                        Sent in [place] Rx for Rosuvastatin <><><><><><><><><><><><><><><><><><><><><><><><><><><><><><><><><>  -  A1c = 5.5%   -    Normal - No Diabetes  <><><><><><><><><><><><><><><><><><><><><><><><><><><><><><><><><>  -  Vitamin D = 83 - Excellent  -  Please keep dose same <><><><><><><><><><><><><><><><><><><><><><><><><><><><><><><><><> <><><><><><><><><><><><><><><><><><><><><><><><><><><><><><><><><>  -  All Else - CBC - - Electrolytes - Liver - Magnesium & Thyroid    - all  Normal / OK ===========================================================

## 2022-05-04 ENCOUNTER — Other Ambulatory Visit: Payer: Self-pay | Admitting: Internal Medicine

## 2022-05-04 DIAGNOSIS — N3000 Acute cystitis without hematuria: Secondary | ICD-10-CM | POA: Diagnosis not present

## 2022-05-04 MED ORDER — SULFAMETHOXAZOLE-TRIMETHOPRIM 800-160 MG PO TABS: ORAL_TABLET | ORAL | 0 refills | Status: DC

## 2022-05-05 ENCOUNTER — Telehealth: Payer: Self-pay

## 2022-05-05 NOTE — Telephone Encounter (Signed)
LM-05/05/22-Calling pt. To confirm CP visit for 6/13 at 1:30PM. Spoke to pt. And pt. Requested to cancel visit. Explained to pt. What CCM is, but pt. Is not interested.  Total time spent: 5 min.

## 2022-05-07 ENCOUNTER — Other Ambulatory Visit: Payer: Self-pay | Admitting: Internal Medicine

## 2022-05-07 DIAGNOSIS — N3000 Acute cystitis without hematuria: Secondary | ICD-10-CM

## 2022-05-07 LAB — URINE CULTURE
MICRO NUMBER:: 13501722
SPECIMEN QUALITY:: ADEQUATE

## 2022-05-08 ENCOUNTER — Other Ambulatory Visit: Payer: Self-pay | Admitting: Internal Medicine

## 2022-05-08 MED ORDER — CEFUROXIME AXETIL 250 MG PO TABS
ORAL_TABLET | ORAL | 0 refills | Status: DC
Start: 2022-05-08 — End: 2022-11-15

## 2022-05-09 ENCOUNTER — Telehealth: Payer: Medicare Other | Admitting: Pharmacy Technician

## 2022-06-08 ENCOUNTER — Ambulatory Visit: Payer: Medicare Other

## 2022-06-13 ENCOUNTER — Other Ambulatory Visit: Payer: Self-pay | Admitting: Adult Health

## 2022-06-13 DIAGNOSIS — F32A Depression, unspecified: Secondary | ICD-10-CM

## 2022-06-15 ENCOUNTER — Other Ambulatory Visit: Payer: Self-pay | Admitting: Internal Medicine

## 2022-06-15 ENCOUNTER — Ambulatory Visit (INDEPENDENT_AMBULATORY_CARE_PROVIDER_SITE_OTHER): Payer: Medicare Other

## 2022-06-15 DIAGNOSIS — N3000 Acute cystitis without hematuria: Secondary | ICD-10-CM

## 2022-06-15 DIAGNOSIS — N179 Acute kidney failure, unspecified: Secondary | ICD-10-CM | POA: Diagnosis not present

## 2022-06-15 DIAGNOSIS — N1832 Chronic kidney disease, stage 3b: Secondary | ICD-10-CM

## 2022-06-15 NOTE — Progress Notes (Signed)
Patient presents to the office to have labs done to check a urine and CMET.

## 2022-06-16 LAB — URINE CULTURE
MICRO NUMBER:: 13673816
SPECIMEN QUALITY:: ADEQUATE

## 2022-06-16 LAB — COMPLETE METABOLIC PANEL WITH GFR
AG Ratio: 2.4 (calc) (ref 1.0–2.5)
ALT: 6 U/L (ref 6–29)
AST: 14 U/L (ref 10–35)
Albumin: 4.7 g/dL (ref 3.6–5.1)
Alkaline phosphatase (APISO): 86 U/L (ref 37–153)
BUN/Creatinine Ratio: 28 (calc) — ABNORMAL HIGH (ref 6–22)
BUN: 27 mg/dL — ABNORMAL HIGH (ref 7–25)
CO2: 24 mmol/L (ref 20–32)
Calcium: 10 mg/dL (ref 8.6–10.4)
Chloride: 107 mmol/L (ref 98–110)
Creat: 0.96 mg/dL — ABNORMAL HIGH (ref 0.60–0.95)
Globulin: 2 g/dL (calc) (ref 1.9–3.7)
Glucose, Bld: 96 mg/dL (ref 65–99)
Potassium: 4.9 mmol/L (ref 3.5–5.3)
Sodium: 142 mmol/L (ref 135–146)
Total Bilirubin: 0.4 mg/dL (ref 0.2–1.2)
Total Protein: 6.7 g/dL (ref 6.1–8.1)
eGFR: 60 mL/min/{1.73_m2} (ref >=60)

## 2022-06-16 LAB — URINALYSIS, ROUTINE W REFLEX MICROSCOPIC
Bacteria, UA: NONE SEEN /HPF
Bilirubin Urine: NEGATIVE
Glucose, UA: NEGATIVE
Hgb urine dipstick: NEGATIVE
Hyaline Cast: NONE SEEN /LPF
Nitrite: NEGATIVE
Specific Gravity, Urine: >=1.045 — AB (ref 1.001–1.035)
Squamous Epithelial / HPF: NONE SEEN /HPF (ref <=5)
pH: <=5 (ref 5.0–8.0)

## 2022-06-17 NOTE — Progress Notes (Signed)
<><><><><><><><><><><><><><><><><><><><><><><><><><><><><><><><><> <><><><><><><><><><><><><><><><><><><><><><><><><><><><><><><><><>  -    Repeat urine Culture is Negative  - No Infection                                                                        - No more Antibiotics needed at this time   <><><><><><><><><><><><><><><><><><><><><><><><><><><><><><><><><> <><><><><><><><><><><><><><><><><><><><><><><><><><><><><><><><><>

## 2022-07-05 NOTE — Progress Notes (Deleted)
Assessment/Plan:   1.  Parkinsons Disease  -discussed again to take carbidopa/levodopa 25/100 on the following schedule: , 2 at 5am/1 at 7am/2 at 9am/1 at 11am/ 2 at 1pm/ 1 at 3pm/1 at 5pm and patient has added 1 at bedtime.   -Would really like the patient not to walk at all.  Have discussed this for several years.  This would avoid the falls.  -Has declined power wheelchair.    2.  PDD with Parkinson's hallucinations  -She only tried Nuplazid for a week and thought it caused swelling.  That would be an unusual side effect.  3.  Neurogenic Orthostatic Hypotension  -pt rarely using midodrine.  We talked about trying midodrine at least at breakfast and lunch and keeping an eye on her blood pressure.   4.  Constipation  -does well with rancho recipe Subjective:   Alexandra Lindsey was seen today in follow up for Parkinsons disease.  My previous records were reviewed prior to todays visit as well as outside records available to me.   This patient is accompanied in the office by her  husband  who supplements the history.  She saw primary care June 6.  Kidney function worsening, felt due to dehydration because patient not drinking enough water.  Blood pressure in primary care's office only 902 systolic.   Current prescribed movement disorder medications: Carbidopa/levodopa 25/100,  2 at 5am/1 at 7am/2 at 9am/1 at 11am/ 2 at 1pm/ 1 at 3pm/1 at 5pm (and 1 at bed) Midodrine 10 mg 3 times per day (states that only taking prn based on BP and states that she doesn't need to do it often - lightheaded spell few days/week) Nuplazid, 34 mg daily (started last visit again)  PREVIOUS MEDICATIONS: Pramipexole (stopped because of hallucinations); stalevo; klonopin; rytary; nuplazid (states took it for a week and it made her swell); carbidopa/levodopa 50/200 at bedtime (patient just did not take it long after giving it and stopped for unknown reason)  ALLERGIES:   Allergies  Allergen Reactions   Lyrica  [Pregabalin] Rash and Other (See Comments)    Weight gain and swelling in hands, legs and feet   Macrobid [Nitrofurantoin Macrocrystal] Swelling and Rash   Baclofen Other (See Comments)    Kept her awake, affected memory   Cephalosporins Other (See Comments)    Cannot sleep   Codeine Nausea Only    dizziness   Darvocet [Propoxyphene N-Acetaminophen] Nausea And Vomiting   Diclofenac Rash    The gel caused a rash   Levaquin [Levofloxacin] Hives   Penicillins Rash   Selegiline Hcl Rash   Tramadol Nausea And Vomiting   Triamcinolone Itching    CURRENT MEDICATIONS:  Outpatient Encounter Medications as of 07/18/2022  Medication Sig   acetaminophen (TYLENOL) 500 MG tablet Take 1,000 mg by mouth 3 (three) times daily.   albuterol (PROVENTIL HFA;VENTOLIN HFA) 108 (90 Base) MCG/ACT inhaler Inhale 1-2 puffs into the lungs every 6 (six) hours as needed for wheezing or shortness of breath.   carbidopa-levodopa (SINEMET IR) 25-100 MG tablet TAKE 2 TABS AT 5AM, TAKE 1 TAB AT 7AM, TAKE 2 TABS AT 9AM, TAKE 1 TAB AT 11AM, TAKE 2 TABS AT 1PM, TAKE 1 TAB AT 3PM, TAKE 1 TAB AT 5PM, 1 TAB AT BEDTIME   cefUROXime (CEFTIN) 250 MG tablet Take  1 tablet  2 x /day  with food for UTI   Cholecalciferol (VITAMIN D3) 5000 units CAPS Take 5,000 Units by mouth daily.   Cranberry-Vitamin  C-Probiotic (AZO CRANBERRY) 250-30 MG TABS Take 1 tablet by mouth daily.   diclofenac Sodium (VOLTAREN) 1 % GEL Apply 4 g topically 4 (four) times daily.   dicyclomine (BENTYL) 20 MG tablet Take  1/2 to 1 tablet   3 x /day  bedfore Meals for abdominal Cramping, Bloating or Diarrhea (Patient taking differently: as needed. Take  1/2 to 1 tablet   3 x /day  bedfore Meals for abdominal Cramping, Bloating or Diarrhea)   hydrochlorothiazide (HYDRODIURIL) 25 MG tablet Take      1 tablet     Daily      For Fluid Retention / Ankle Swelling   methocarbamol (ROBAXIN) 500 MG tablet Take 500 mg by mouth as needed.   midodrine (PROAMATINE) 10 MG  tablet Take 1/2-1 tab thee times daily for low blood pressure.   NON FORMULARY Arthritis cream   nystatin (MYCOSTATIN/NYSTOP) powder Apply under breasts 2-3 times a day.   ondansetron (ZOFRAN-ODT) 4 MG disintegrating tablet Take 4 mg by mouth every 8 (eight) hours as needed for nausea or vomiting.   OVER THE COUNTER MEDICATION Takes Mirlax daily   phenazopyridine (PYRIDIUM) 200 MG tablet Take 1 tablet (200 mg total) by mouth 3 (three) times daily as needed for pain.   Probiotic Product (ALIGN) 4 MG CAPS Take 1 capsule by mouth daily.   rosuvastatin (CRESTOR) 20 MG tablet Take 1 tablet Daily for Cholesterol   sertraline (ZOLOFT) 100 MG tablet Take  1&1/2 tablets (150 mg)  at Bedtime for Mood                                   /                                       TAKE                   BY                       MOUTH   vitamin B-12 (CYANOCOBALAMIN) 500 MCG tablet Take 500 mcg by mouth daily.   No facility-administered encounter medications on file as of 07/18/2022.    Objective:   PHYSICAL EXAMINATION:    VITALS:   There were no vitals filed for this visit.     GEN:  The patient appears stated age and is in NAD. HEENT:  Normocephalic, atraumatic.  The mucous membranes are moist. The superficial temporal arteries are without ropiness or tenderness.   Neurological examination:  Orientation: The patient is alert and oriented x3. Cranial nerves: There is good facial symmetry with facial hypomimia. The speech is fluent and clear.  She is slightly hypophonic.  Soft palate rises symmetrically and there is no tongue deviation. Hearing is intact to conversational tone. Sensation: Sensation is intact to light touch throughout Motor: Strength is at least antigravity x4.  Movement examination: Tone: There is normal tone in the UE/LE Abnormal movements: No dyskinesia today (and reports not having any at home). Coordination:  There is no decremation in the upper or lower extremities, but  testing in the left hand is limited because of tendon/orthopedic issues. Gait and Station: Not tested today (primarily because I really do not want her walking).  I have reviewed and interpreted the following labs independently  Chemistry      Component Value Date/Time   NA 142 06/15/2022 1409   K 4.9 06/15/2022 1409   CL 107 06/15/2022 1409   CO2 24 06/15/2022 1409   BUN 27 (H) 06/15/2022 1409   CREATININE 0.96 (H) 06/15/2022 1409      Component Value Date/Time   CALCIUM 10.0 06/15/2022 1409   ALKPHOS 94 11/17/2016 1154   AST 14 06/15/2022 1409   ALT 6 06/15/2022 1409   BILITOT 0.4 06/15/2022 1409       Lab Results  Component Value Date   WBC 5.1 05/02/2022   HGB 11.8 05/02/2022   HCT 36.4 05/02/2022   MCV 93.6 05/02/2022   PLT 237 05/02/2022    Lab Results  Component Value Date   TSH 0.93 05/02/2022     Total time spent on today's visit was *** minutes, including both face-to-face time and nonface-to-face time.  Time included that spent on review of records (prior notes available to me/labs/imaging if pertinent), discussing treatment and goals, answering patient's questions and coordinating care.  Cc:  Unk Pinto, MD

## 2022-07-18 ENCOUNTER — Ambulatory Visit: Payer: Medicare Other | Admitting: Neurology

## 2022-07-24 NOTE — Progress Notes (Unsigned)
Assessment/Plan:   1.  Parkinsons Disease  -take carbidopa/levodopa 25/100 on the following schedule: , 2 at 5am/1 at 7am/2 at 9am/1 at 11am/ 2 at 1pm/ 1 at 3pm/1 at 5pm and patient has added 1 at bedtime.   -continues to have multiple falls.  Discussed again that she is not to walk at all.  Have discussed this for several years.  This would avoid the falls.  Her husband has done a great job and is a great help in the home and is supportive of this  -Has declined power wheelchair.    -discussed Parkinsons Disease dance class which can be done when sitting  -discussed RSB which can be done while sitting  2.  PDD with Parkinson's hallucinations  -She only tried Nuplazid for a week and thought it caused swelling.  That would be an unusual side effect.  3.  Neurogenic Orthostatic Hypotension  -pt rarely using midodrine.    4.  Constipation  -taking miralax regularly.  Discussed increasing water intake.  Husband trying to encourage this.   Subjective:   Alexandra Lindsey was seen today in follow up for Parkinsons disease.  My previous records were reviewed prior to todays visit as well as outside records available to me.   This patient is accompanied in the office by her  husband  who supplements the history.  She saw primary care June 6.  Kidney function worsening, felt due to dehydration because patient not drinking enough water.  Blood pressure in primary care's office only 073 systolic.  She has had multiple falls, twice over the weekend.  "Its not as much as it was."  Sunday she was getting ready for church and she was falling and her eye hit her husbands arm as she was falling.    She reports she just doesn't feel good.  Reports she is sleepy.  States her back is painful.  She gets up early, 5-6am and eats breakfast and then sleeps 2 hours and then another 1 hour nap in the afternoon.  She watches TV during the remainder of the day.  States that her BP is good at home and she isn't taking the  midodrine.  States usually running in the mid 110's.  No syncopal spells.  She has hallucinations of people in trees and motorcycles in trees but not close up.  She knows that they are not real.  Husband helps with all ADL's.     Current prescribed movement disorder medications: Carbidopa/levodopa 25/100,  2 at 5am/1 at 7am/2 at 9am/1 at 11am/ 2 at 1pm/ 1 at 3pm/1 at 5pm (and 1 at bed) Midodrine 10 mg 3 times per day (states that only taking prn based on BP and states that she doesn't need to do it often - lightheaded spell few days/week)  PREVIOUS MEDICATIONS: Pramipexole (stopped because of hallucinations); stalevo; klonopin; rytary; nuplazid (states took it for a week and it made her swell); carbidopa/levodopa 50/200 at bedtime (patient just did not take it long after giving it and stopped for unknown reason)  ALLERGIES:   Allergies  Allergen Reactions   Lyrica [Pregabalin] Rash and Other (See Comments)    Weight gain and swelling in hands, legs and feet   Macrobid [Nitrofurantoin Macrocrystal] Swelling and Rash   Baclofen Other (See Comments)    Kept her awake, affected memory   Cephalosporins Other (See Comments)    Cannot sleep   Codeine Nausea Only    dizziness   Darvocet [Propoxyphene N-Acetaminophen]  Nausea And Vomiting   Diclofenac Rash    The gel caused a rash   Levaquin [Levofloxacin] Hives   Penicillins Rash   Selegiline Hcl Rash   Tramadol Nausea And Vomiting   Triamcinolone Itching    CURRENT MEDICATIONS:  Outpatient Encounter Medications as of 07/25/2022  Medication Sig   acetaminophen (TYLENOL) 500 MG tablet Take 1,000 mg by mouth 3 (three) times daily.   albuterol (PROVENTIL HFA;VENTOLIN HFA) 108 (90 Base) MCG/ACT inhaler Inhale 1-2 puffs into the lungs every 6 (six) hours as needed for wheezing or shortness of breath.   carbidopa-levodopa (SINEMET IR) 25-100 MG tablet TAKE 2 TABS AT 5AM, TAKE 1 TAB AT 7AM, TAKE 2 TABS AT 9AM, TAKE 1 TAB AT 11AM, TAKE 2 TABS AT  1PM, TAKE 1 TAB AT 3PM, TAKE 1 TAB AT 5PM, 1 TAB AT BEDTIME   cefUROXime (CEFTIN) 250 MG tablet Take  1 tablet  2 x /day  with food for UTI   Cholecalciferol (VITAMIN D3) 5000 units CAPS Take 5,000 Units by mouth daily.   Cranberry-Vitamin C-Probiotic (AZO CRANBERRY) 250-30 MG TABS Take 1 tablet by mouth daily.   diclofenac Sodium (VOLTAREN) 1 % GEL Apply 4 g topically 4 (four) times daily.   dicyclomine (BENTYL) 20 MG tablet Take  1/2 to 1 tablet   3 x /day  bedfore Meals for abdominal Cramping, Bloating or Diarrhea (Patient taking differently: as needed. Take  1/2 to 1 tablet   3 x /day  bedfore Meals for abdominal Cramping, Bloating or Diarrhea)   hydrochlorothiazide (HYDRODIURIL) 25 MG tablet Take      1 tablet     Daily      For Fluid Retention / Ankle Swelling   methocarbamol (ROBAXIN) 500 MG tablet Take 500 mg by mouth as needed.   midodrine (PROAMATINE) 10 MG tablet Take 1/2-1 tab thee times daily for low blood pressure.   NON FORMULARY Arthritis cream   nystatin (MYCOSTATIN/NYSTOP) powder Apply under breasts 2-3 times a day.   ondansetron (ZOFRAN-ODT) 4 MG disintegrating tablet Take 4 mg by mouth every 8 (eight) hours as needed for nausea or vomiting.   OVER THE COUNTER MEDICATION Takes Mirlax daily   phenazopyridine (PYRIDIUM) 200 MG tablet Take 1 tablet (200 mg total) by mouth 3 (three) times daily as needed for pain.   Probiotic Product (ALIGN) 4 MG CAPS Take 1 capsule by mouth daily.   sertraline (ZOLOFT) 100 MG tablet Take  1&1/2 tablets (150 mg)  at Bedtime for Mood                                   /                                       TAKE                   BY                       MOUTH   vitamin B-12 (CYANOCOBALAMIN) 500 MCG tablet Take 500 mcg by mouth daily.   rosuvastatin (CRESTOR) 20 MG tablet Take 1 tablet Daily for Cholesterol (Patient not taking: Reported on 07/25/2022)   No facility-administered encounter medications on file as of 07/25/2022.    Objective:    PHYSICAL EXAMINATION:  VITALS:   Vitals:   07/25/22 0857  BP: 112/64  Pulse: (!) 50  SpO2: 99%  Weight: 142 lb (64.4 kg)  Height: 5' (1.524 m)    GEN:  The patient appears stated age and is in NAD. HEENT:  Normocephalic, atraumatic.  The mucous membranes are moist. The superficial temporal arteries are without ropiness or tenderness.   Neurological examination:  Orientation: The patient is alert and oriented x3. Cranial nerves: There is good facial symmetry with facial hypomimia. The speech is fluent and clear.  She is slightly hypophonic.  Soft palate rises symmetrically and there is no tongue deviation. Hearing is intact to conversational tone. Sensation: Sensation is intact to light touch throughout Motor: Strength is at least antigravity x4.  Movement examination: Tone: There is normal tone in the UE/LE Abnormal movements: No dyskinesia today (and reports not having any at home). Coordination:  There is no decremation in the upper or lower extremities, but testing in the left hand is limited because of tendon/orthopedic issues. Gait and Station: Not tested today (because I really do not want her walking or encouraging her to walk).  I have reviewed and interpreted the following labs independently    Chemistry      Component Value Date/Time   NA 142 06/15/2022 1409   K 4.9 06/15/2022 1409   CL 107 06/15/2022 1409   CO2 24 06/15/2022 1409   BUN 27 (H) 06/15/2022 1409   CREATININE 0.96 (H) 06/15/2022 1409      Component Value Date/Time   CALCIUM 10.0 06/15/2022 1409   ALKPHOS 94 11/17/2016 1154   AST 14 06/15/2022 1409   ALT 6 06/15/2022 1409   BILITOT 0.4 06/15/2022 1409       Lab Results  Component Value Date   WBC 5.1 05/02/2022   HGB 11.8 05/02/2022   HCT 36.4 05/02/2022   MCV 93.6 05/02/2022   PLT 237 05/02/2022    Lab Results  Component Value Date   TSH 0.93 05/02/2022     Total time spent on today's visit was 31 minutes, including both  face-to-face time and nonface-to-face time.  Time included that spent on review of records (prior notes available to me/labs/imaging if pertinent), discussing treatment and goals, answering patient's questions and coordinating care.  Cc:  Unk Pinto, MD

## 2022-07-25 ENCOUNTER — Encounter: Payer: Self-pay | Admitting: Neurology

## 2022-07-25 ENCOUNTER — Ambulatory Visit (INDEPENDENT_AMBULATORY_CARE_PROVIDER_SITE_OTHER): Payer: Medicare Other | Admitting: Neurology

## 2022-07-25 VITALS — BP 112/64 | HR 50 | Ht 60.0 in | Wt 142.0 lb

## 2022-07-25 DIAGNOSIS — G20 Parkinson's disease: Secondary | ICD-10-CM | POA: Diagnosis not present

## 2022-07-25 DIAGNOSIS — R441 Visual hallucinations: Secondary | ICD-10-CM

## 2022-07-25 NOTE — Patient Instructions (Signed)
Local and Online Resources for Power over Parkinson's Group August 2023  LOCAL [place] PARKINSON'S GROUPS  Power over Parkinson's Group:   Power Over Parkinson's Patient Education Group will be Wednesday, August 9th-*Hybrid meting*- in person at [facility] location and via [facility] at 2:00 pm.   Upcoming Power over Parkinson's Meetings:  2nd Wednesdays of the month at 2 pm:  August 9th, September 13th, October 11th Contact Amy Marriott at [email or web address] if interested in participating in this group Parkinson's Care Partners Group:    3rd Mondays, Contact Misty Paladino Atypical Parkinsonian Patient Group:   4th Wednesdays, [place] If you are interested in participating in these groups with Misty, please contact her directly for how to join those meetings.  Her contact information is [email or web address].    LOCAL EVENTS AND NEW OFFERINGS Parkinson's T-shirts for sale!  Designed by a local group member, with funds going to [employer]. X-Large sizes available.  Contact Misty to purchase  New PWR! Moves [employer] Instructor-Led Class offering at [employer]!  Wednesdays 1-2 pm, starting April 12th.   Contact Ronalee Belts Sabin at [employer], [email or web address]   [place]:  [email or web address] PD Health at Home continues:  Mindfulness Mondays, Wellness Wednesdays, Fitness Fridays  Upcoming Education:   Care Partners:  Why they Matter and why their Needs Matter.  Wednesday, August 9th 1-2 pm Invisible Symptoms:  NonMotor Symptoms.  Wednesday, August 16th 1-2 pm Expert Briefing:   Parkinson's Disease and the Bladder.  Wednesday, September 13th 1:00-2:00 pm Register for expert briefings (webinars) at September 26 Please check out their website to sign up for emails and see their full online  offerings   [place]:  [email or web address]  Third Thursday Webinars:  On the third Thursday of every month at 12 p.m. ET, join our free live webinars to learn about various aspects of living with Parkinson's disease and our work to speed medical breakthroughs. Upcoming Webinar: Too Much or Not Enough:  Dyskinesia and "off" time in Parkinson's (Replay).  Thursday, August 17th at 12 noon. Check out additional information on their website to see their full online offerings  [facility]:  [email or web address] Upcoming Webinar:   Stay tuned Webinar Series:  Living with Parkinson's Meetup.   Third Thursdays each month, 3 pm Care Partner Monthly Meetup.  With 01-11-1985 [place].  First Tuesday of each month, 2 pm Check out additional information to Live Well Today on their website  Parkinson and Movement Disorders (PMD) Alliance:  www.[email or web address] [employer] Online:  Online Education Events Sign up for emails, which are sent weekly to give you updates on programming and online offerings  Parkinson's Association of the [place]:  www.[email or web address] Information on online support groups, education events, and online exercises including Yoga, Parkinson's exercises and more-LOTS of information on links to PD resources and online events Virtual Support Group through Parkinson's Association of the [place]; next one is scheduled for Wednesday, September 6th at 2 pm. (These are typically scheduled for the 1st Wednesday of the month at 2 pm).  Visit website for details. Save the date for "Caring for Parkinson's-Caring for You", 9th Annual Symposium.  In-person event in [place].  September 9th.  More info on registration to come. MOVEMENT AND EXERCISE OPPORTUNITIES PWR! Moves Classes at [place].  Wednesdays 10 and 11 am.   Contact Amy Marriott, PT [email or web address] if interested. NEW PWR! Moves Class offering at 08-27-1994.  Wednesdays 1-2 pm, starting April 12th.  Contact Caron Presume at [place], [email or web address] Here is a link to the PWR!Moves classes on Zoom from [place] - Daily Mon-Sat at 10:00. Via Zoom, FREE and open to all.  There is also a link below via Facebook if you use that platform.  [email or web address]  Parkinson's Wellness Recovery (PWR! Moves)  www.[email or web address] Info on the PWR! Virtual Experience:  You will have access to our expertise through self-assessment, guided plans that start with the PD-specific fundamentals, educational content, tips, Q&A with an expert, and a growing Art therapist of PD-specific pre-recorded and live exercise classes of varying types and intensity - both physical and cognitive! If that is not enough, we offer 1:1 wellness consultations (in-person or virtual) to personalize your PWR! Research scientist (medical).  [place] Fridays:  As part of the PD Health @ Home program, this free video series focuses each week on one aspect of fitness designed to support people living with Parkinson's.  These weekly videos highlight the [place] recent fitness guidelines for people with Parkinson's disease. ModemGamers.si Dance for PD website is offering free, live-stream classes throughout the week, as well as links to [employer] of classes:  [email or web address] Virtual dance and Pilates for Parkinson's classes: Click on the Community Tab> Parkinson's Movement Initiative Tab.  To register for classes and for more information, visit [email or web address] and click the "community" tab.  [employer] Parkinson's Cycling Classes  [employer]:  Thursdays @ Noon-Live  classes at [employer] ([employer] at [email or web address] or [phone]) [employer]: Virtual Classes Mondays and Thursdays Jeanette Caprice classes Tuesday, Wednesday and Thursday (contact [place] at [email or web address]  or [phone]) [place] Varied levels of classes are offered Tuesdays and Thursdays at [facility].  Stretching with Verdis Frederickson weekly class is also offered for people with Parkinson's To observe a class or for more information, call [phone] or email Hezzie Bump at [email or web address] ADDITIONAL SUPPORT AND RESOURCES [employer]:Online Caregiver Education Opportunities:  [email or web address]/[email or web address]/[email or web address]-support-group.  You may also contact Vickki Muff at [email or web address] or [phone].    Well-Spring Navigator:  [phone] program, a free service to help individuals and families through the journey of determining care for older adults.  The "Navigator" is a [employer], Education officer, museum, who will speak with a prospective client and/or loved ones to provide an assessment of the situation and a set of recommendations for a personalized care plan -- all free of charge, and whether [facility] offers the needed service or not. If the need is not a service we provide, we are well-connected with reputable programs in town that we can refer you to.  www.well-[email or web address] or to speak with the Navigator, call [phone].

## 2022-08-16 ENCOUNTER — Ambulatory Visit: Payer: Medicare Other | Admitting: Nurse Practitioner

## 2022-08-17 ENCOUNTER — Encounter: Payer: Self-pay | Admitting: Nurse Practitioner

## 2022-08-17 ENCOUNTER — Ambulatory Visit (INDEPENDENT_AMBULATORY_CARE_PROVIDER_SITE_OTHER): Payer: Medicare Other | Admitting: Nurse Practitioner

## 2022-08-17 VITALS — BP 122/76 | HR 72 | Temp 97.7°F

## 2022-08-17 DIAGNOSIS — E559 Vitamin D deficiency, unspecified: Secondary | ICD-10-CM

## 2022-08-17 DIAGNOSIS — G894 Chronic pain syndrome: Secondary | ICD-10-CM

## 2022-08-17 DIAGNOSIS — E782 Mixed hyperlipidemia: Secondary | ICD-10-CM

## 2022-08-17 DIAGNOSIS — M8000XS Age-related osteoporosis with current pathological fracture, unspecified site, sequela: Secondary | ICD-10-CM

## 2022-08-17 DIAGNOSIS — R6889 Other general symptoms and signs: Secondary | ICD-10-CM

## 2022-08-17 DIAGNOSIS — G20 Parkinson's disease: Secondary | ICD-10-CM | POA: Diagnosis not present

## 2022-08-17 DIAGNOSIS — Z0001 Encounter for general adult medical examination with abnormal findings: Secondary | ICD-10-CM

## 2022-08-17 DIAGNOSIS — K219 Gastro-esophageal reflux disease without esophagitis: Secondary | ICD-10-CM | POA: Diagnosis not present

## 2022-08-17 DIAGNOSIS — K589 Irritable bowel syndrome without diarrhea: Secondary | ICD-10-CM

## 2022-08-17 DIAGNOSIS — Z79899 Other long term (current) drug therapy: Secondary | ICD-10-CM

## 2022-08-17 DIAGNOSIS — R531 Weakness: Secondary | ICD-10-CM

## 2022-08-17 DIAGNOSIS — Z9181 History of falling: Secondary | ICD-10-CM

## 2022-08-17 DIAGNOSIS — R7309 Other abnormal glucose: Secondary | ICD-10-CM

## 2022-08-17 DIAGNOSIS — R195 Other fecal abnormalities: Secondary | ICD-10-CM | POA: Diagnosis not present

## 2022-08-17 DIAGNOSIS — G20A1 Parkinson's disease without dyskinesia, without mention of fluctuations: Secondary | ICD-10-CM

## 2022-08-17 DIAGNOSIS — G4752 REM sleep behavior disorder: Secondary | ICD-10-CM

## 2022-08-17 DIAGNOSIS — Z0000 Encounter for general adult medical examination without abnormal findings: Secondary | ICD-10-CM

## 2022-08-17 DIAGNOSIS — I10 Essential (primary) hypertension: Secondary | ICD-10-CM | POA: Diagnosis not present

## 2022-08-17 DIAGNOSIS — M5416 Radiculopathy, lumbar region: Secondary | ICD-10-CM | POA: Diagnosis not present

## 2022-08-17 NOTE — Patient Instructions (Signed)
Irritable Bowel Syndrome, Adult  Irritable bowel syndrome (IBS) is a group of symptoms that affects the organs responsible for digestion (gastrointestinal tract, or GI tract). IBS is not one specific disease. To regulate how the GI tract works, the body sends signals back and forth between the intestines and the brain. If you have IBS, there may be a problem with these signals. As a result, the GI tract does not function normally. The intestines may become more sensitive and overreact to certain things. This may be especially true when you eat certain foods or when you are under stress. There are four main types of IBS. These may be determined based on the consistency of your stool (feces): IBS with mostly (predominance of) diarrhea. IBS with predominance of constipation. IBS with mixed bowel habits. This includes both diarrhea and constipation. IBS unclassified. This includes IBS that cannot be categorized into one of the other three main types. It is important to know which type of IBS you have. Certain treatments are more likely to be helpful for certain types of IBS. What are the causes? The exact cause of IBS is not known. What increases the risk? You may have a higher risk for IBS if you: Are female. Are younger than 40 years. Have a family history of IBS. Have a mental health condition, such as depression, anxiety, or post-traumatic stress disorder. Have had a bacterial infection of your GI tract. What are the signs or symptoms? Symptoms of IBS vary from person to person. The main symptom is abdominal pain or discomfort. Other symptoms usually include one or more of the following: Diarrhea, constipation, or both. Swelling or bloating in the abdomen. Feeling full after eating a small or regular-sized meal. Frequent gas. Mucus in the stool. A feeling of having more stool left after a bowel movement. Symptoms tend to come and go. They may be triggered by stress, mental health  conditions, or certain foods. How is this diagnosed? This condition may be diagnosed based on a physical exam, your medical history, and your symptoms. You may have tests, such as: Blood tests. Stool test. Colonoscopy. This is a procedure in which your GI tract is viewed with a long, thin, flexible tube. How is this treated? There is no cure for IBS, but treatment can help relieve symptoms. Treatment depends on the type of IBS you have, and may include: Changes to your diet, such as: Avoiding foods that cause symptoms. Drinking more water. Following a low-FODMAP (fermentable oligosaccharides, disaccharides, monosaccharides, and polyols) diet for up to 6 weeks, or as told by your health care provider. FODMAPs are sugars that are hard for some people to digest. Eating more fiber. Eating small meals at the same times every day. Medicines. These may include: Fiber supplements, if you have constipation. Medicine to control diarrhea (antidiarrheal medicines). Medicine to help control muscle tightening (spasms) in your GI tract (antispasmodic medicines). Medicines to help with mental health conditions, such as antidepressants. Talk therapy or counseling. Working with a dietitian to help create a food plan that is right for you. Managing your stress. Follow these instructions at home: Eating and drinking  Eat a healthy diet. Eat 5-6 small meals a day. Try to eat meals at about the same times each day. Do not eat large meals. Gradually eat more fiber-rich foods. These include whole grains, fruits, and vegetables. This may be especially helpful if you have IBS with constipation. Eat a diet low in FODMAPs. You may need to avoid foods such as   citrus fruits, cabbage, garlic, and onions. Drink enough fluid to keep your urine pale yellow. Keep a journal of foods that seem to trigger symptoms. Avoid foods and drinks that: Contain added sugar. Make your symptoms worse. These may include dairy  products, caffeinated drinks, and carbonated drinks. Alcohol use Do not drink alcohol if: Your health care provider tells you not to drink. You are pregnant, may be pregnant, or are planning to become pregnant. If you drink alcohol: Limit how much you have to: 0-1 drink a day for women. 0-2 drinks a day for men. Know how much alcohol is in your drink. In the [place] one drink equals one 12 oz bottle of beer (355 mL), one 5 oz glass of wine (148 mL), or one 1 oz glass of hard liquor (44 mL) General instructions Take over-the-counter and prescription medicines only as told by your health care provider. This includes supplements. Get enough exercise. Do at least 150 minutes of moderate-intensity exercise each week. Manage your stress. Getting enough sleep and exercise can help you manage stress. Keep all follow-up visits. This is important. This includes all visits with your health care provider and therapist. Where to find more information International Foundation for Functional Gastrointestinal Disorders: aboutibs.org National Institute of Diabetes and Digestive and Kidney Diseases: niddk.nih.gov Contact a health care provider if: You have constant pain. You lose weight. You have diarrhea that gets worse. You have bleeding from the rectum. You vomit often. You have a fever. Get help right away if: You have severe abdominal pain. You have diarrhea with symptoms of dehydration, such as dizziness or dry mouth. You have bloody or black stools. You have severe abdominal bloating. You have vomiting that does not stop. You have blood in your vomit. Summary Irritable bowel syndrome (IBS) is not one specific disease. It is a group of symptoms that affects digestion. Your intestines may become more sensitive and overreact to certain things. This may be especially true when you eat certain foods or when you are under stress. There is no cure for IBS, but treatment can help relieve  symptoms. This information is not intended to replace advice given to you by your health care provider. Make sure you discuss any questions you have with your health care provider. Document Revised: 10/26/2021 Document Reviewed: 10/26/2021 Elsevier Patient Education  2023 Elsevier Inc.  

## 2022-08-17 NOTE — Progress Notes (Signed)
MEDICARE WELLNESS Assessment:   Diagnoses and all orders for this visit:  Annual Medicare Wellness Visit Due annually  Health maintenance reviewed   Essential hypertension Discussed DASH (Dietary Approaches to Stop Hypertension) DASH diet is lower in sodium than a typical American diet. Cut back on foods that are high in saturated fat, cholesterol, and trans fats. Eat more whole-grain foods, fish, poultry, and nuts Remain active and exercise as tolerated daily.  Monitor BP at home-Call if greater than 130/80.  Check CMP/CBC  Hyperlipidemia, mixed Discussed lifestyle modifications. Recommended diet heavy in fruits and veggies, omega 3's. Decrease consumption of animal meats, cheeses, and dairy products. Remain active and exercise as tolerated. Continue to monitor. Check lipids/TSH   Parkinson disease ([place]) Continue current regimen Follows with neurology, Dr [name] Will do home evaluation for medication and disease management, ADL's, self-care, education, in home help  Vitamin D deficiency Continue supplement. Monitor levels  Abnormal glucose  Education: Reviewed 'ABCs' of diabetes management  Discussed goals to be met and/or maintained include A1C (<7) Blood pressure (<130/80) Cholesterol (LDL <70) Continue Eye Exam yearly  Continue Dental Exam Q6 mo Discussed dietary recommendations Discussed Physical Activity recommendations Foot exam UTD Check A1C   Irritable bowel syndrome with constipation Doing well at this time, avoid triggers  Gastroesophageal reflux disease, unspecified whether esophagitis present No suspected reflux complications ([place]/stricture). Lifestyle modification:  wt loss, avoid meals 2-3h before bedtime. Consider eliminating food triggers:  chocolate, caffeine, EtOH, acid/spicy food.  Status post thoracic spinal fusion Lumbar radiculopathy Age-related osteoporosis with current pathological fracture, sequela Has seen Dr. Prince Rome Continue  tylenol/muscle relaxer, can use ibuprofen sparingly Lifestyle modification discussed  Medication management All medications discussed and reviewed in full. All questions and concerns regarding medications addressed.    Positive colorectal cancer screening using Cologuard test Declines further follow up Saw Dr. Amedeo Plenty, no colonoscopy  RBD (REM behavioral disorder) Continue meds  Chronic constipation Continue miralax, increase fiber indiet, INCREASE FLUID - goal 3-4 bottles Increase activity as able Add senokot daily Follow up if not improves or constipation longer than 3 days  High risk falls/weakness Wheelchair per neuro recommendation Will do home evaluation and PT/OT and [place] transfer education, in home help If limited benefit with home health or coverage issues will transition to palliative/hospice involvement for additional resources    Over 40 minutes of face to face interview, exam, counseling, chart review, and critical decision making was performed  Future Appointments  Date Time Provider [place]  11/16/2022  2:00 PM Unk Pinto, MD [facility] None  02/06/2023  1:00 PM [name], Eustace Quail, DO [facility] None  05/08/2023  2:00 PM Unk Pinto, MD [facility] None     Plan:   Subjective:  Alexandra Lindsey is a 81 y.o. female who presents for 3 month follow and AWV. She has Parkinson disease ([place]); RBD (REM behavioral disorder); Hyperlipidemia; Hypertension; GERD (gastroesophageal reflux disease); Abnormal glucose; Vitamin D deficiency; Medication management; SI (sacroiliac) pain; Chronic pain syndrome; Lumbar radiculopathy; Lumbar stenosis with neurogenic claudication; Positive colorectal cancer screening using Cologuard test; Frequent falls; S/P spinal fusion; Postlaminectomy syndrome, not elsewhere classified; Osteoporosis; Stage 3a chronic kidney disease ([place]); and Constipation on their problem list.  She is accompanied by her husband who is her primary  caregiver.   Follows closely with Dr. Carles Collet for advancing Parkinson's, on sinemet IR - 2 tablets at 5 AM, 2 tablets at 8 AM, 1 tablet at noon, 1 tablet at 4 PM, 1 tablet at 7 PM, 2 tablets  at bedtime.  Off of mirapex due to hallucinations, but states still having some, Dr. Arbutus Leas is aware.   She is very unstable r/t Parkinson's with high fall risk, numerous falls with injury, has transitioned from walker to Wheelchair per neuro recommendations. Functional incontinence, wears depends. Husband is primary caregiver, has 24/7 needs, does pay out of pocket for in home help twice a week, but would benefit from additional assistance.   She is currently prescribed sertraline 100 mg daily for mood, she does feel this helps, doing fairly.   She is seeing Dr Lorenso Courier related to multiple fractures and she is s/p thoracic spinal fusion. She reports chronic pain following, some good/bad days, but sitting in chair 20 min will cause severe pain, lying down helps. Taking tylenol 1000 mg TID, baclofen with variable benefit. Requests referral back to Dr. Lorenso Courier today.   She had a + cologuard in 2018, saw Dr. Madilyn Fireman who recommended monitoring only, declines further colonoscopy, no blood in stool. However she does report constipation ongoing, has been taking miralax, fiber supplement. Admits to very little activity, very poor water intake.   BMI has not been calculated.  Patient unable to stand.  She has not been working on diet and exercise. Wt Readings from Last 3 Encounters:  07/25/22 142 lb (64.4 kg)  06/15/22 142 lb (64.4 kg)  05/02/22 142 lb (64.4 kg)   Her blood pressure has been controlled at home, today their BP is BP: 122/76 She does not workout. She denies chest pain, shortness of breath, dizziness.   She is not on cholesterol medication and denies myalgias. Her cholesterol is not at goal. The cholesterol last visit was:   Lab Results  Component Value Date   CHOL 229 (H) 05/02/2022   HDL 55 05/02/2022    LDLCALC 144 (H) 05/02/2022   TRIG 160 (H) 05/02/2022   CHOLHDL 4.2 05/02/2022   :  Lab Results  Component Value Date   HGBA1C 5.5 05/02/2022   She has CKD III monitored at this office. She Last GFR Lab Results  Component Value Date   GFRNONAA 52 (L) 05/02/2021   GFRNONAA 53 (L) 01/18/2021   GFRNONAA 63 07/20/2020    Patient is on Vitamin D supplement.   Lab Results  Component Value Date   VD25OH 83 05/02/2022       Medication Review: Current Outpatient Medications on File Prior to Visit  Medication Sig Dispense Refill   acetaminophen (TYLENOL) 500 MG tablet Take 1,000 mg by mouth 3 (three) times daily.     albuterol (PROVENTIL HFA;VENTOLIN HFA) 108 (90 Base) MCG/ACT inhaler Inhale 1-2 puffs into the lungs every 6 (six) hours as needed for wheezing or shortness of breath.     carbidopa-levodopa (SINEMET IR) 25-100 MG tablet TAKE 2 TABS AT 5AM, TAKE 1 TAB AT 7AM, TAKE 2 TABS AT 9AM, TAKE 1 TAB AT 11AM, TAKE 2 TABS AT 1PM, TAKE 1 TAB AT 3PM, TAKE 1 TAB AT 5PM, 1 TAB AT BEDTIME 990 tablet 0   cefUROXime (CEFTIN) 250 MG tablet Take  1 tablet  2 x /day  with food for UTI 14 tablet 0   Cholecalciferol (VITAMIN D3) 5000 units CAPS Take 5,000 Units by mouth daily.     Cranberry-Vitamin C-Probiotic (AZO CRANBERRY) 250-30 MG TABS Take 1 tablet by mouth daily.     diclofenac Sodium (VOLTAREN) 1 % GEL Apply 4 g topically 4 (four) times daily. 350 g 1   dicyclomine (BENTYL) 20 MG tablet Take  1/2 to 1 tablet   3 x /day  bedfore Meals for abdominal Cramping, Bloating or Diarrhea (Patient taking differently: as needed. Take  1/2 to 1 tablet   3 x /day  bedfore Meals for abdominal Cramping, Bloating or Diarrhea) 90 tablet 0   hydrochlorothiazide (HYDRODIURIL) 25 MG tablet Take      1 tablet     Daily      For Fluid Retention / Ankle Swelling 90 tablet 1   methocarbamol (ROBAXIN) 500 MG tablet Take 500 mg by mouth as needed.     midodrine (PROAMATINE) 10 MG tablet Take 1/2-1 tab thee times daily  for low blood pressure. 180 tablet 1   NON FORMULARY Arthritis cream     nystatin (MYCOSTATIN/NYSTOP) powder Apply under breasts 2-3 times a day. 60 g 2   ondansetron (ZOFRAN-ODT) 4 MG disintegrating tablet Take 4 mg by mouth every 8 (eight) hours as needed for nausea or vomiting.     OVER THE COUNTER MEDICATION Takes Mirlax daily     Probiotic Product (ALIGN) 4 MG CAPS Take 1 capsule by mouth daily.     rosuvastatin (CRESTOR) 20 MG tablet Take 1 tablet Daily for Cholesterol 90 tablet 3   sertraline (ZOLOFT) 100 MG tablet Take  1&1/2 tablets (150 mg)  at Bedtime for Mood                                   /                                       TAKE                   BY                       MOUTH 405 tablet 3   vitamin B-12 (CYANOCOBALAMIN) 500 MCG tablet Take 500 mcg by mouth daily.     No current facility-administered medications on file prior to visit.    Allergies: Allergies  Allergen Reactions   Lyrica [Pregabalin] Rash and Other (See Comments)    Weight gain and swelling in hands, legs and feet   Macrobid [Nitrofurantoin Macrocrystal] Swelling and Rash   Baclofen Other (See Comments)    Kept her awake, affected memory   Cephalosporins Other (See Comments)    Cannot sleep   Codeine Nausea Only    dizziness   Darvocet [Propoxyphene N-Acetaminophen] Nausea And Vomiting   Diclofenac Rash    The gel caused a rash   Levaquin [Levofloxacin] Hives   Penicillins Rash   Selegiline Hcl Rash   Tramadol Nausea And Vomiting   Triamcinolone Itching    Current Problems (verified) has Parkinson disease ([facility]); RBD (REM behavioral disorder); Hyperlipidemia; Hypertension; GERD (gastroesophageal reflux disease); Abnormal glucose; Vitamin D deficiency; Medication management; SI (sacroiliac) pain; Chronic pain syndrome; Lumbar radiculopathy; Lumbar stenosis with neurogenic claudication; Positive colorectal cancer screening using Cologuard test; Frequent falls; S/P spinal fusion; Postlaminectomy  syndrome, not elsewhere classified; Osteoporosis; Stage 3a chronic kidney disease ([facility]); and Constipation on their problem list.  Screening Tests Immunization History  Administered Date(s) Administered   Influenza, High Dose Seasonal PF 09/08/2014, 10/04/2015, 09/02/2019, 08/30/2020, 09/26/2021   Influenza-Unspecified 09/29/2016, 08/08/2017, 08/22/2018   PFIZER(Purple Top)SARS-COV-2 Vaccination 12/22/2019, 01/23/2020, 09/28/2020   Pneumococcal Conjugate-13 11/10/2015  Pneumococcal Polysaccharide-23 02/26/2017   Pneumococcal-Unspecified 11/27/2006   Td 09/08/2013   Zoster, Live 06/27/2006    Preventative care:  Tetanus: 08/2013 Pneumonia: 2/2 Influenza: Due 08/2022 Shingles - zoster 2007  Covid 19 2/2, 2021, pfizer + booster  Last colonoscopy: 2008 cologuard + 02/2017 saw Dr. Amedeo Plenty who didn't recommend colonoscopy, no sx other than constipation (chronic)  DEXA: 2019, osteoporosis, R forearm T-3.4, hip T -2.6, alendronate failed, declines further, would not pursue other treatment.  Mammogram: 09/2017, Dr. Ulanda Edison, declines further  Names of Other Physician/Practitioners you currently use: 1. [facility] here for primary care 2. Dr. Sabra Heck, eye doctor, last visit 2022, has reading glasses 3. Dr. Kathryne Sharper, dentist, last visit 2022, goes q81m  Patient Care Team: Unk Pinto, MD as PCP - General (Internal Medicine) Teena Irani, MD (Inactive) as Consulting Physician (Gastroenterology) Troy Sine, MD as Consulting Physician (Cardiology) Lyndal Pulley, DO as Attending Physician (Family Medicine) [name], Eustace Quail, DO as Consulting Physician (Neurology) Marybelle Killings, MD as Consulting Physician (Orthopedic Surgery) Atilano Ina, MD as Referring Physician (Neurosurgery) Powers, Rosezella [place], MD as Referring Physician (Oral Surgery) Rush Landmark, [facility] as Pharmacist (Pharmacist)  Surgical: She  has a past surgical history that includes  Abdominal hysterectomy; Hernia repair; Rotator cuff repair (Left, 10-07-12); Bunionectomy (Bilateral, 11-11-3); Cardiac catheterization (Bilateral, 2013); left heart catheterization with coronary angiogram (N/A, 07/03/2012); Appendectomy; Colonoscopy; Back surgery (12/20/2015); Repair of cerebrospinal fluid leak (N/A, 12/24/2015); Back surgery (09/27/2017); and Back surgery (2020). Family Her family history includes CVA in her brother; Esophageal cancer in her mother; Heart attack in her mother; Hypercholesterolemia in her brother; Hypertension in her brother; Mental retardation in her brother; Suicidality in her brother and father. Social history  She reports that she has never smoked. She has never used smokeless tobacco. She reports that she does not drink alcohol and does not use drugs.    MEDICARE WELLNESS OBJECTIVES: Physical activity:   Cardiac risk factors:   Depression/mood screen:      08/17/2022    3:41 PM  Depression screen PHQ 2/9  Decreased Interest 0  Down, Depressed, Hopeless 0  PHQ - 2 Score 0    ADLs:     08/17/2022    3:41 PM 11/08/2021    9:49 PM  In your present state of health, do you have any difficulty performing the following activities:  Hearing? 0 0  Vision? 0 0  Difficulty concentrating or making decisions? 0 1  Comment  poor insight & poor judgement  Walking or climbing stairs? 1 1  Comment  requires assistance  Dressing or bathing? 1 1  Comment  requires assistance  Doing errands, shopping? 1 1  Comment  requires assistance & transport  Preparing Food and eating ? Y   Using the Toilet? Y   In the past six months, have you accidently leaked urine? Y   Do you have problems with loss of bowel control? Y   Managing your Medications? Y   Managing your Finances? Y   Housekeeping or managing your Housekeeping? Y      Cognitive Testing  Alert? Yes  Normal Appearance?Yes  Oriented to person? Yes  Place? Yes   Time? Yes  Recall of three objects?  Yes  Can  perform simple calculations? Yes  Displays appropriate judgment?Yes  Can read the correct time from a watch face?Yes  EOL planning: Does Patient Have a Medical Advance Directive?: Yes Type of Advance Directive: Living will   Objective:  Today's Vitals   08/17/22 1409  BP: 122/76  Pulse: 72  Temp: 97.7 F (36.5 C)  SpO2: 99%     General appearance: Wheelchair, alert, no distress, WD/WN, female HEENT: normocephalic, sclerae anicteric, TMs pearly, nares patent, no discharge or erythema, pharynx normal, Oral cavity: MMM, no lesions Neck: supple, no lymphadenopathy, no thyromegaly, no masses Heart: RRR, normal S1, S2, no murmurs Lungs: CTA bilaterally, no wheezes, rhonchi, or rales Abdomen: +bs, rounded, soft, non tender, no palpable masses, no hepatomegaly, no splenomegaly Musculoskeletal: Obvious bony enlargement at MCP, DIP, PIP joints without erythema, heat, effusion, with mild ulnar deviation deformity bilaterally; Scoliosis, hunched over in wheelchair.  Skin: Free of any rashes, lesions, or ecchymosis.   Extremities: no edema, no cyanosis, no clubbing Pulses: 2+ symmetric, upper and lower extremities, normal cap refill Neurological: alert, oriented x 3, CN2-12 intact, strength decreased upper extremities and lower extremities, sensation normal throughout, has baseline tremor, mild mask facies. In wheelchair, gait not assessed.   Psychiatric: normal affect, behavior normal, pleasant    Medicare Attestation I have personally reviewed: The patient's medical and social history Their use of alcohol, tobacco or illicit drugs Their current medications and supplements The patient's functional ability including ADLs,fall risks, home safety risks, cognitive, and hearing and visual impairment Diet and physical activities Evidence for depression or mood disorders  The patient's weight, height, BMI, and visual acuity have been recorded in the chart.  I have made referrals,  counseling, and provided education to the patient based on review of the above and I have provided the patient with a written personalized care plan for preventive services.      Darrol Jump, NP   04/06/2020

## 2022-08-18 LAB — CBC WITH DIFFERENTIAL/PLATELET
Absolute Monocytes: 432 cells/uL (ref 200–950)
Basophils Absolute: 19 cells/uL (ref 0–200)
Basophils Relative: 0.4 %
Eosinophils Absolute: 80 cells/uL (ref 15–500)
Eosinophils Relative: 1.7 %
HCT: 37.1 % (ref 35.0–45.0)
Hemoglobin: 12.5 g/dL (ref 11.7–15.5)
Lymphs Abs: 1039 cells/uL (ref 850–3900)
MCH: 31.6 pg (ref 27.0–33.0)
MCHC: 33.7 g/dL (ref 32.0–36.0)
MCV: 93.7 fL (ref 80.0–100.0)
MPV: 10.6 fL (ref 7.5–12.5)
Monocytes Relative: 9.2 %
Neutro Abs: 3130 cells/uL (ref 1500–7800)
Neutrophils Relative %: 66.6 %
Platelets: 259 10*3/uL (ref 140–400)
RBC: 3.96 10*6/uL (ref 3.80–5.10)
RDW: 11.8 % (ref 11.0–15.0)
Total Lymphocyte: 22.1 %
WBC: 4.7 10*3/uL (ref 3.8–10.8)

## 2022-08-18 LAB — TSH: TSH: 0.98 mIU/L (ref 0.40–4.50)

## 2022-08-18 LAB — LIPID PANEL
Cholesterol: 245 mg/dL — ABNORMAL HIGH (ref <200)
HDL: 44 mg/dL — ABNORMAL LOW (ref >=50)
LDL Cholesterol (Calc): 150 mg/dL (calc) — ABNORMAL HIGH
Non-HDL Cholesterol (Calc): 201 mg/dL (calc) — ABNORMAL HIGH (ref <130)
Total CHOL/HDL Ratio: 5.6 (calc) — ABNORMAL HIGH (ref <5.0)
Triglycerides: 329 mg/dL — ABNORMAL HIGH (ref <150)

## 2022-08-18 LAB — COMPLETE METABOLIC PANEL WITH GFR
AG Ratio: 2.3 (calc) (ref 1.0–2.5)
ALT: 6 U/L (ref 6–29)
AST: 10 U/L (ref 10–35)
Albumin: 4.6 g/dL (ref 3.6–5.1)
Alkaline phosphatase (APISO): 94 U/L (ref 37–153)
BUN/Creatinine Ratio: 24 (calc) — ABNORMAL HIGH (ref 6–22)
BUN: 25 mg/dL (ref 7–25)
CO2: 26 mmol/L (ref 20–32)
Calcium: 9.8 mg/dL (ref 8.6–10.4)
Chloride: 107 mmol/L (ref 98–110)
Creat: 1.04 mg/dL — ABNORMAL HIGH (ref 0.60–0.95)
Globulin: 2 g/dL (calc) (ref 1.9–3.7)
Glucose, Bld: 100 mg/dL — ABNORMAL HIGH (ref 65–99)
Potassium: 4.7 mmol/L (ref 3.5–5.3)
Sodium: 141 mmol/L (ref 135–146)
Total Bilirubin: 0.4 mg/dL (ref 0.2–1.2)
Total Protein: 6.6 g/dL (ref 6.1–8.1)
eGFR: 54 mL/min/{1.73_m2} — ABNORMAL LOW (ref >=60)

## 2022-08-18 LAB — MAGNESIUM: Magnesium: 2.3 mg/dL (ref 1.5–2.5)

## 2022-08-18 LAB — VITAMIN B12: Vitamin B-12: 1998 pg/mL — ABNORMAL HIGH (ref 200–1100)

## 2022-08-28 DIAGNOSIS — I129 Hypertensive chronic kidney disease with stage 1 through stage 4 chronic kidney disease, or unspecified chronic kidney disease: Secondary | ICD-10-CM | POA: Diagnosis not present

## 2022-08-28 DIAGNOSIS — G894 Chronic pain syndrome: Secondary | ICD-10-CM | POA: Diagnosis not present

## 2022-08-28 DIAGNOSIS — Z9181 History of falling: Secondary | ICD-10-CM | POA: Diagnosis not present

## 2022-08-28 DIAGNOSIS — G20A1 Parkinson's disease without dyskinesia, without mention of fluctuations: Secondary | ICD-10-CM | POA: Diagnosis not present

## 2022-08-28 DIAGNOSIS — K219 Gastro-esophageal reflux disease without esophagitis: Secondary | ICD-10-CM | POA: Diagnosis not present

## 2022-08-28 DIAGNOSIS — M8000XS Age-related osteoporosis with current pathological fracture, unspecified site, sequela: Secondary | ICD-10-CM | POA: Diagnosis not present

## 2022-08-28 DIAGNOSIS — N1831 Chronic kidney disease, stage 3a: Secondary | ICD-10-CM | POA: Diagnosis not present

## 2022-08-28 DIAGNOSIS — M961 Postlaminectomy syndrome, not elsewhere classified: Secondary | ICD-10-CM | POA: Diagnosis not present

## 2022-08-28 DIAGNOSIS — R32 Unspecified urinary incontinence: Secondary | ICD-10-CM | POA: Diagnosis not present

## 2022-08-28 DIAGNOSIS — M5416 Radiculopathy, lumbar region: Secondary | ICD-10-CM | POA: Diagnosis not present

## 2022-08-28 DIAGNOSIS — G4752 REM sleep behavior disorder: Secondary | ICD-10-CM | POA: Diagnosis not present

## 2022-08-28 DIAGNOSIS — K581 Irritable bowel syndrome with constipation: Secondary | ICD-10-CM | POA: Diagnosis not present

## 2022-08-28 DIAGNOSIS — E559 Vitamin D deficiency, unspecified: Secondary | ICD-10-CM | POA: Diagnosis not present

## 2022-08-28 DIAGNOSIS — E782 Mixed hyperlipidemia: Secondary | ICD-10-CM | POA: Diagnosis not present

## 2022-08-28 DIAGNOSIS — Z981 Arthrodesis status: Secondary | ICD-10-CM | POA: Diagnosis not present

## 2022-08-28 DIAGNOSIS — M48062 Spinal stenosis, lumbar region with neurogenic claudication: Secondary | ICD-10-CM | POA: Diagnosis not present

## 2022-08-29 DIAGNOSIS — N1831 Chronic kidney disease, stage 3a: Secondary | ICD-10-CM | POA: Diagnosis not present

## 2022-08-29 DIAGNOSIS — I129 Hypertensive chronic kidney disease with stage 1 through stage 4 chronic kidney disease, or unspecified chronic kidney disease: Secondary | ICD-10-CM | POA: Diagnosis not present

## 2022-08-29 DIAGNOSIS — G20A1 Parkinson's disease without dyskinesia, without mention of fluctuations: Secondary | ICD-10-CM | POA: Diagnosis not present

## 2022-08-29 DIAGNOSIS — M8000XS Age-related osteoporosis with current pathological fracture, unspecified site, sequela: Secondary | ICD-10-CM | POA: Diagnosis not present

## 2022-08-29 DIAGNOSIS — G894 Chronic pain syndrome: Secondary | ICD-10-CM | POA: Diagnosis not present

## 2022-08-29 DIAGNOSIS — G4752 REM sleep behavior disorder: Secondary | ICD-10-CM | POA: Diagnosis not present

## 2022-08-31 DIAGNOSIS — G20A1 Parkinson's disease without dyskinesia, without mention of fluctuations: Secondary | ICD-10-CM | POA: Diagnosis not present

## 2022-08-31 DIAGNOSIS — G894 Chronic pain syndrome: Secondary | ICD-10-CM | POA: Diagnosis not present

## 2022-08-31 DIAGNOSIS — I129 Hypertensive chronic kidney disease with stage 1 through stage 4 chronic kidney disease, or unspecified chronic kidney disease: Secondary | ICD-10-CM | POA: Diagnosis not present

## 2022-08-31 DIAGNOSIS — G4752 REM sleep behavior disorder: Secondary | ICD-10-CM | POA: Diagnosis not present

## 2022-08-31 DIAGNOSIS — N1831 Chronic kidney disease, stage 3a: Secondary | ICD-10-CM | POA: Diagnosis not present

## 2022-08-31 DIAGNOSIS — M8000XS Age-related osteoporosis with current pathological fracture, unspecified site, sequela: Secondary | ICD-10-CM | POA: Diagnosis not present

## 2022-09-04 DIAGNOSIS — G894 Chronic pain syndrome: Secondary | ICD-10-CM | POA: Diagnosis not present

## 2022-09-04 DIAGNOSIS — M8000XS Age-related osteoporosis with current pathological fracture, unspecified site, sequela: Secondary | ICD-10-CM | POA: Diagnosis not present

## 2022-09-04 DIAGNOSIS — I129 Hypertensive chronic kidney disease with stage 1 through stage 4 chronic kidney disease, or unspecified chronic kidney disease: Secondary | ICD-10-CM | POA: Diagnosis not present

## 2022-09-04 DIAGNOSIS — G20A1 Parkinson's disease without dyskinesia, without mention of fluctuations: Secondary | ICD-10-CM | POA: Diagnosis not present

## 2022-09-04 DIAGNOSIS — N1831 Chronic kidney disease, stage 3a: Secondary | ICD-10-CM | POA: Diagnosis not present

## 2022-09-04 DIAGNOSIS — G4752 REM sleep behavior disorder: Secondary | ICD-10-CM | POA: Diagnosis not present

## 2022-09-06 DIAGNOSIS — N1831 Chronic kidney disease, stage 3a: Secondary | ICD-10-CM | POA: Diagnosis not present

## 2022-09-06 DIAGNOSIS — M8000XS Age-related osteoporosis with current pathological fracture, unspecified site, sequela: Secondary | ICD-10-CM | POA: Diagnosis not present

## 2022-09-06 DIAGNOSIS — G894 Chronic pain syndrome: Secondary | ICD-10-CM | POA: Diagnosis not present

## 2022-09-06 DIAGNOSIS — I129 Hypertensive chronic kidney disease with stage 1 through stage 4 chronic kidney disease, or unspecified chronic kidney disease: Secondary | ICD-10-CM | POA: Diagnosis not present

## 2022-09-06 DIAGNOSIS — G4752 REM sleep behavior disorder: Secondary | ICD-10-CM | POA: Diagnosis not present

## 2022-09-06 DIAGNOSIS — G20A1 Parkinson's disease without dyskinesia, without mention of fluctuations: Secondary | ICD-10-CM | POA: Diagnosis not present

## 2022-09-08 DIAGNOSIS — G4752 REM sleep behavior disorder: Secondary | ICD-10-CM | POA: Diagnosis not present

## 2022-09-08 DIAGNOSIS — G894 Chronic pain syndrome: Secondary | ICD-10-CM | POA: Diagnosis not present

## 2022-09-08 DIAGNOSIS — N1831 Chronic kidney disease, stage 3a: Secondary | ICD-10-CM | POA: Diagnosis not present

## 2022-09-08 DIAGNOSIS — G20A1 Parkinson's disease without dyskinesia, without mention of fluctuations: Secondary | ICD-10-CM | POA: Diagnosis not present

## 2022-09-08 DIAGNOSIS — M8000XS Age-related osteoporosis with current pathological fracture, unspecified site, sequela: Secondary | ICD-10-CM | POA: Diagnosis not present

## 2022-09-08 DIAGNOSIS — I129 Hypertensive chronic kidney disease with stage 1 through stage 4 chronic kidney disease, or unspecified chronic kidney disease: Secondary | ICD-10-CM | POA: Diagnosis not present

## 2022-09-12 ENCOUNTER — Telehealth: Payer: Self-pay | Admitting: Nurse Practitioner

## 2022-09-12 DIAGNOSIS — G4752 REM sleep behavior disorder: Secondary | ICD-10-CM | POA: Diagnosis not present

## 2022-09-12 DIAGNOSIS — I129 Hypertensive chronic kidney disease with stage 1 through stage 4 chronic kidney disease, or unspecified chronic kidney disease: Secondary | ICD-10-CM | POA: Diagnosis not present

## 2022-09-12 DIAGNOSIS — G20A1 Parkinson's disease without dyskinesia, without mention of fluctuations: Secondary | ICD-10-CM | POA: Diagnosis not present

## 2022-09-12 DIAGNOSIS — G894 Chronic pain syndrome: Secondary | ICD-10-CM | POA: Diagnosis not present

## 2022-09-12 DIAGNOSIS — M8000XS Age-related osteoporosis with current pathological fracture, unspecified site, sequela: Secondary | ICD-10-CM | POA: Diagnosis not present

## 2022-09-12 DIAGNOSIS — N1831 Chronic kidney disease, stage 3a: Secondary | ICD-10-CM | POA: Diagnosis not present

## 2022-09-12 NOTE — Telephone Encounter (Signed)
FOR DOCUMENTATION ONLY: Alexandra Lindsey home health called to report a fall that was not witnessed by them earlier in the day. They did treat her and their evaluation came up fine, no sign of any external injury and no sign of injury to her head. She declined wanting a f/u with the office according to him, he was just wanting to call and report it.

## 2022-09-19 DIAGNOSIS — M8000XS Age-related osteoporosis with current pathological fracture, unspecified site, sequela: Secondary | ICD-10-CM | POA: Diagnosis not present

## 2022-09-19 DIAGNOSIS — G20A1 Parkinson's disease without dyskinesia, without mention of fluctuations: Secondary | ICD-10-CM | POA: Diagnosis not present

## 2022-09-19 DIAGNOSIS — N1831 Chronic kidney disease, stage 3a: Secondary | ICD-10-CM | POA: Diagnosis not present

## 2022-09-19 DIAGNOSIS — G4752 REM sleep behavior disorder: Secondary | ICD-10-CM | POA: Diagnosis not present

## 2022-09-19 DIAGNOSIS — G894 Chronic pain syndrome: Secondary | ICD-10-CM | POA: Diagnosis not present

## 2022-09-19 DIAGNOSIS — I129 Hypertensive chronic kidney disease with stage 1 through stage 4 chronic kidney disease, or unspecified chronic kidney disease: Secondary | ICD-10-CM | POA: Diagnosis not present

## 2022-09-27 DIAGNOSIS — E782 Mixed hyperlipidemia: Secondary | ICD-10-CM | POA: Diagnosis not present

## 2022-09-27 DIAGNOSIS — I129 Hypertensive chronic kidney disease with stage 1 through stage 4 chronic kidney disease, or unspecified chronic kidney disease: Secondary | ICD-10-CM | POA: Diagnosis not present

## 2022-09-27 DIAGNOSIS — N1831 Chronic kidney disease, stage 3a: Secondary | ICD-10-CM | POA: Diagnosis not present

## 2022-09-27 DIAGNOSIS — M5416 Radiculopathy, lumbar region: Secondary | ICD-10-CM | POA: Diagnosis not present

## 2022-09-27 DIAGNOSIS — M8000XS Age-related osteoporosis with current pathological fracture, unspecified site, sequela: Secondary | ICD-10-CM | POA: Diagnosis not present

## 2022-09-27 DIAGNOSIS — G20A1 Parkinson's disease without dyskinesia, without mention of fluctuations: Secondary | ICD-10-CM | POA: Diagnosis not present

## 2022-09-27 DIAGNOSIS — Z9181 History of falling: Secondary | ICD-10-CM | POA: Diagnosis not present

## 2022-09-27 DIAGNOSIS — M961 Postlaminectomy syndrome, not elsewhere classified: Secondary | ICD-10-CM | POA: Diagnosis not present

## 2022-09-27 DIAGNOSIS — K219 Gastro-esophageal reflux disease without esophagitis: Secondary | ICD-10-CM | POA: Diagnosis not present

## 2022-09-27 DIAGNOSIS — Z981 Arthrodesis status: Secondary | ICD-10-CM | POA: Diagnosis not present

## 2022-09-27 DIAGNOSIS — G4752 REM sleep behavior disorder: Secondary | ICD-10-CM | POA: Diagnosis not present

## 2022-09-27 DIAGNOSIS — K581 Irritable bowel syndrome with constipation: Secondary | ICD-10-CM | POA: Diagnosis not present

## 2022-09-27 DIAGNOSIS — M48062 Spinal stenosis, lumbar region with neurogenic claudication: Secondary | ICD-10-CM | POA: Diagnosis not present

## 2022-09-27 DIAGNOSIS — R32 Unspecified urinary incontinence: Secondary | ICD-10-CM | POA: Diagnosis not present

## 2022-09-27 DIAGNOSIS — E559 Vitamin D deficiency, unspecified: Secondary | ICD-10-CM | POA: Diagnosis not present

## 2022-09-27 DIAGNOSIS — G894 Chronic pain syndrome: Secondary | ICD-10-CM | POA: Diagnosis not present

## 2022-10-02 DIAGNOSIS — I129 Hypertensive chronic kidney disease with stage 1 through stage 4 chronic kidney disease, or unspecified chronic kidney disease: Secondary | ICD-10-CM | POA: Diagnosis not present

## 2022-10-02 DIAGNOSIS — M8000XS Age-related osteoporosis with current pathological fracture, unspecified site, sequela: Secondary | ICD-10-CM | POA: Diagnosis not present

## 2022-10-02 DIAGNOSIS — N1831 Chronic kidney disease, stage 3a: Secondary | ICD-10-CM | POA: Diagnosis not present

## 2022-10-02 DIAGNOSIS — G894 Chronic pain syndrome: Secondary | ICD-10-CM | POA: Diagnosis not present

## 2022-10-02 DIAGNOSIS — G20A1 Parkinson's disease without dyskinesia, without mention of fluctuations: Secondary | ICD-10-CM | POA: Diagnosis not present

## 2022-10-02 DIAGNOSIS — G4752 REM sleep behavior disorder: Secondary | ICD-10-CM | POA: Diagnosis not present

## 2022-10-04 DIAGNOSIS — I129 Hypertensive chronic kidney disease with stage 1 through stage 4 chronic kidney disease, or unspecified chronic kidney disease: Secondary | ICD-10-CM | POA: Diagnosis not present

## 2022-10-04 DIAGNOSIS — M8000XS Age-related osteoporosis with current pathological fracture, unspecified site, sequela: Secondary | ICD-10-CM | POA: Diagnosis not present

## 2022-10-04 DIAGNOSIS — G894 Chronic pain syndrome: Secondary | ICD-10-CM | POA: Diagnosis not present

## 2022-10-04 DIAGNOSIS — G4752 REM sleep behavior disorder: Secondary | ICD-10-CM | POA: Diagnosis not present

## 2022-10-04 DIAGNOSIS — G20A1 Parkinson's disease without dyskinesia, without mention of fluctuations: Secondary | ICD-10-CM | POA: Diagnosis not present

## 2022-10-04 DIAGNOSIS — N1831 Chronic kidney disease, stage 3a: Secondary | ICD-10-CM | POA: Diagnosis not present

## 2022-10-05 ENCOUNTER — Ambulatory Visit: Payer: Medicare Other | Admitting: Nurse Practitioner

## 2022-10-10 ENCOUNTER — Other Ambulatory Visit: Payer: Self-pay | Admitting: Neurology

## 2022-10-10 DIAGNOSIS — N1831 Chronic kidney disease, stage 3a: Secondary | ICD-10-CM | POA: Diagnosis not present

## 2022-10-10 DIAGNOSIS — G20A1 Parkinson's disease without dyskinesia, without mention of fluctuations: Secondary | ICD-10-CM | POA: Diagnosis not present

## 2022-10-10 DIAGNOSIS — G894 Chronic pain syndrome: Secondary | ICD-10-CM | POA: Diagnosis not present

## 2022-10-10 DIAGNOSIS — M8000XS Age-related osteoporosis with current pathological fracture, unspecified site, sequela: Secondary | ICD-10-CM | POA: Diagnosis not present

## 2022-10-10 DIAGNOSIS — I129 Hypertensive chronic kidney disease with stage 1 through stage 4 chronic kidney disease, or unspecified chronic kidney disease: Secondary | ICD-10-CM | POA: Diagnosis not present

## 2022-10-10 DIAGNOSIS — G4752 REM sleep behavior disorder: Secondary | ICD-10-CM | POA: Diagnosis not present

## 2022-10-12 ENCOUNTER — Ambulatory Visit (INDEPENDENT_AMBULATORY_CARE_PROVIDER_SITE_OTHER): Payer: Medicare Other | Admitting: Nurse Practitioner

## 2022-10-12 ENCOUNTER — Encounter: Payer: Self-pay | Admitting: Nurse Practitioner

## 2022-10-12 DIAGNOSIS — Z23 Encounter for immunization: Secondary | ICD-10-CM

## 2022-10-12 DIAGNOSIS — M48062 Spinal stenosis, lumbar region with neurogenic claudication: Secondary | ICD-10-CM | POA: Diagnosis not present

## 2022-10-12 DIAGNOSIS — R531 Weakness: Secondary | ICD-10-CM

## 2022-10-12 DIAGNOSIS — G20B2 Parkinson's disease with dyskinesia, with fluctuations: Secondary | ICD-10-CM

## 2022-10-12 DIAGNOSIS — M5416 Radiculopathy, lumbar region: Secondary | ICD-10-CM

## 2022-10-12 DIAGNOSIS — R296 Repeated falls: Secondary | ICD-10-CM

## 2022-10-12 DIAGNOSIS — M8000XS Age-related osteoporosis with current pathological fracture, unspecified site, sequela: Secondary | ICD-10-CM

## 2022-10-12 DIAGNOSIS — Z741 Need for assistance with personal care: Secondary | ICD-10-CM

## 2022-10-12 NOTE — Progress Notes (Signed)
Face to Face Encounter Documentation  Assessment and Plan:  Alexandra Lindsey was seen today for an episodic visit.  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 Discussed med's effects and SE's.    Over 20 minutes of exam, counseling, chart review, and critical decision making was performed.   Future Appointments  Date Time Provider [place]  11/16/2022  2:00 PM Unk Pinto, MD [facility] None  02/06/2023  1:00 PM [name], Eustace Quail, DO [facility] None  05/08/2023  2:00 PM Unk Pinto, MD [facility] None    ------------------------------------------------------------------------------------------------------------------   HPI BP 124/78   Pulse 79   Temp (!) 96.8 F (36 C)   Ht 5' (1.524 m)   Wt 142 lb (64.4 kg)   SpO2 99%   BMI 27.73 kg/m   81 y.o.female presents for a F2F encounter.  I certify that Alexandra Lindsey has been under my care, and that I had a face to face encounter that meets the physician face to face encounter requirements with this patient within the last six months. The major reason for the visit was a mobility examination.  My clinical findings support the need for a Power Mobility Device (PMD) because of Parkinson's disease, lumbar radiculopathy/stenosis with neurogenic claudication, osteoporosis, frequent falls.  Patient is being evaluated and treated for worsening BLE weakness.  The patients limited mobility is causing lower extremity edema pain with ambulation and increase in falls, weakness.  I  certify that, based on my findings the following DME is medically necessary: Power Mobility Device (PMD).  The patients Parkinson's disease, lumbar radiculopathy/stenosis with neurogenic claudication, osteoporosis and frequent falls is limiting patients activities of daily living by impairing functional mobility as in ambulating and transferring.  Patience mobility is impaired in the home, even though patient is currently using a walker and manual wheelchair.  She is losing upper body strength to manually push the wheels of the manual wheelchair.  Due to increase in weakness from Parkinson's, patient is not able to operate a manual wheelchair in the home,  therefore, a manual wheelchair does not meet patients mobility needs in the home. Patient has the physical and mental abilities to operate APMD safely in the home.  Pain in patients, BLE and lower back limit ambulation. Ambulation difficulty overtime has progressed. Diagnosis that relate to ambulatory problems: include Parkinson's disease, lumbar radiculopathy/stenosis with neurogenic claudication, osteoporosis, frequent falls.  Patient can walk 3 feet without stopping. Pace of ambulation is slow.   Ambulatory assistance currently used: manual wheelchair and walker.  Changes to require a PMD: Patient is now developing BLE edema and her  ability to stand up from a seated position without assistance is limited.   Home setting: lives at home with husband.  Ability to perform adls at home: impaired functional mobility in ambulating and transferring.  Physical exam as above.  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   Spinal stenosis      Allergies  Allergen Reactions   Lyrica [Pregabalin] Rash and Other (See Comments)    Weight gain and swelling in hands, legs and feet   Macrobid [Nitrofurantoin Macrocrystal] Swelling and Rash   Baclofen Other (See Comments)    Kept her awake, affected memory   Cephalosporins Other (See Comments)    Cannot sleep   Codeine Nausea Only    dizziness   Darvocet [Propoxyphene N-Acetaminophen] Nausea And Vomiting   Diclofenac Rash    The gel caused a rash   Levaquin [Levofloxacin] Hives   Penicillins Rash   Selegiline Hcl Rash   Tramadol Nausea And Vomiting   Triamcinolone Itching    Current Outpatient Medications on File Prior to Visit  Medication Sig   acetaminophen (TYLENOL) 500 MG tablet Take 1,000 mg by mouth 3 (three) times daily.   albuterol (PROVENTIL HFA;VENTOLIN HFA) 108 (90 Base) MCG/ACT inhaler Inhale 1-2 puffs into the lungs every 6 (six) hours as needed for wheezing or shortness of breath.   carbidopa-levodopa (SINEMET IR) 25-100 MG tablet TAKE 2 TABS AT 5AM, TAKE 1 TAB AT 7AM, TAKE 2 TABS AT 9AM, TAKE 1 TAB AT 11AM, TAKE 2 TABS AT 1PM, TAKE 1 TAB AT 3PM, TAKE 1 TAB AT 5PM, 1 TAB AT BEDTIME   Cholecalciferol (VITAMIN D3) 5000 units CAPS Take 5,000 Units by mouth daily.   diclofenac Sodium (VOLTAREN) 1 % GEL Apply 4 g topically 4 (four) times daily.   dicyclomine (BENTYL) 20 MG tablet Take  1/2 to 1 tablet   3 x /day  bedfore Meals for abdominal Cramping, Bloating or Diarrhea (Patient taking differently: as needed. Take  1/2 to 1 tablet   3 x /day  bedfore Meals for abdominal Cramping, Bloating or Diarrhea)   hydrochlorothiazide (HYDRODIURIL) 25 MG tablet Take      1 tablet     Daily      For Fluid Retention / Ankle Swelling   methocarbamol (ROBAXIN) 500 MG tablet Take 500 mg by mouth as needed.   midodrine (PROAMATINE) 10 MG tablet Take 1/2-1 tab thee times daily for low blood pressure.   NON  FORMULARY Arthritis cream   nystatin (MYCOSTATIN/NYSTOP) powder Apply under breasts 2-3 times a day.   ondansetron (ZOFRAN-ODT) 4 MG disintegrating tablet Take 4 mg by mouth every 8 (eight) hours as needed for nausea or vomiting.   OVER THE COUNTER MEDICATION Takes Mirlax daily   Probiotic Product (ALIGN) 4 MG CAPS Take 1 capsule by mouth daily.   rosuvastatin (CRESTOR) 20 MG tablet Take 1 tablet Daily for Cholesterol   sertraline (ZOLOFT) 100 MG tablet Take  1&1/2 tablets (150 mg)  at Bedtime for Mood                                   /                                       TAKE                   BY  MOUTH   vitamin B-12 (CYANOCOBALAMIN) 500 MCG tablet Take 500 mcg by mouth daily.   cefUROXime (CEFTIN) 250 MG tablet Take  1 tablet  2 x /day  with food for UTI   Cranberry-Vitamin C-Probiotic (AZO CRANBERRY) 250-30 MG TABS Take 1 tablet by mouth daily. (Patient not taking: Reported on 10/12/2022)   No current facility-administered medications on file prior to visit.    ROS: all negative except what is noted in the HPI.   Physical Exam:  BP 124/78   Pulse 79   Temp (!) 96.8 F (36 C)   Ht 5' (1.524 m)   Wt 142 lb (64.4 kg)   SpO2 99%   BMI 27.73 kg/m   General Appearance: Wheelchair bound. NAD.  Awake, conversant and cooperative. Eyes: PERRLA, EOMs intact.  Sclera white.  Conjunctiva without erythema. Sinuses: No frontal/maxillary tenderness.  No nasal discharge. Nares patent.  ENT/Mouth: Ext aud canals clear.  Bilateral TMs w/DOL and without erythema or bulging. Hearing intact.  Posterior pharynx without swelling or exudate.  Tonsils without swelling or erythema.  Neck: Supple.  No masses, nodules or thyromegaly. Respiratory: Effort is regular with non-labored breathing. Breath sounds are equal bilaterally without rales, rhonchi, wheezing or stridor.  Cardio: RRR with no MRGs. Brisk peripheral pulses with +3 edema.  Abdomen: Active BS in all four quadrants.   Soft and non-tender without guarding, rebound tenderness, hernias or masses. Lymphatics: Non tender without lymphadenopathy.  Musculoskeletal:  LROM, 1/5 strength in BLE, unable to ambulate without assistance.   Skin: Appropriate color for ethnicity. Warm without rashes, lesions, ecchymosis, ulcers.  Neuro: CN II-XII grossly normal. Normal muscle tone without cerebellar symptoms and intact sensation.   Psych: AO X 3,  appropriate mood and affect, insight and judgment.     Darrol Jump, NP 2:07 PM [facility]

## 2022-10-13 ENCOUNTER — Ambulatory Visit: Payer: Medicare Other | Admitting: Neurology

## 2022-10-13 DIAGNOSIS — N1831 Chronic kidney disease, stage 3a: Secondary | ICD-10-CM | POA: Diagnosis not present

## 2022-10-13 DIAGNOSIS — G4752 REM sleep behavior disorder: Secondary | ICD-10-CM | POA: Diagnosis not present

## 2022-10-13 DIAGNOSIS — I129 Hypertensive chronic kidney disease with stage 1 through stage 4 chronic kidney disease, or unspecified chronic kidney disease: Secondary | ICD-10-CM | POA: Diagnosis not present

## 2022-10-13 DIAGNOSIS — G20A1 Parkinson's disease without dyskinesia, without mention of fluctuations: Secondary | ICD-10-CM | POA: Diagnosis not present

## 2022-10-13 DIAGNOSIS — G894 Chronic pain syndrome: Secondary | ICD-10-CM | POA: Diagnosis not present

## 2022-10-13 DIAGNOSIS — M8000XS Age-related osteoporosis with current pathological fracture, unspecified site, sequela: Secondary | ICD-10-CM | POA: Diagnosis not present

## 2022-10-17 NOTE — Addendum Note (Signed)
Addended by: Darrol Jump on: 10/17/2022 09:30 AM   Modules accepted: Orders

## 2022-10-18 DIAGNOSIS — G894 Chronic pain syndrome: Secondary | ICD-10-CM | POA: Diagnosis not present

## 2022-10-18 DIAGNOSIS — G4752 REM sleep behavior disorder: Secondary | ICD-10-CM | POA: Diagnosis not present

## 2022-10-18 DIAGNOSIS — N1831 Chronic kidney disease, stage 3a: Secondary | ICD-10-CM | POA: Diagnosis not present

## 2022-10-18 DIAGNOSIS — I129 Hypertensive chronic kidney disease with stage 1 through stage 4 chronic kidney disease, or unspecified chronic kidney disease: Secondary | ICD-10-CM | POA: Diagnosis not present

## 2022-10-18 DIAGNOSIS — M8000XS Age-related osteoporosis with current pathological fracture, unspecified site, sequela: Secondary | ICD-10-CM | POA: Diagnosis not present

## 2022-10-18 DIAGNOSIS — G20A1 Parkinson's disease without dyskinesia, without mention of fluctuations: Secondary | ICD-10-CM | POA: Diagnosis not present

## 2022-10-25 DIAGNOSIS — N1831 Chronic kidney disease, stage 3a: Secondary | ICD-10-CM | POA: Diagnosis not present

## 2022-10-25 DIAGNOSIS — G4752 REM sleep behavior disorder: Secondary | ICD-10-CM | POA: Diagnosis not present

## 2022-10-25 DIAGNOSIS — G894 Chronic pain syndrome: Secondary | ICD-10-CM | POA: Diagnosis not present

## 2022-10-25 DIAGNOSIS — I129 Hypertensive chronic kidney disease with stage 1 through stage 4 chronic kidney disease, or unspecified chronic kidney disease: Secondary | ICD-10-CM | POA: Diagnosis not present

## 2022-10-25 DIAGNOSIS — G20A1 Parkinson's disease without dyskinesia, without mention of fluctuations: Secondary | ICD-10-CM | POA: Diagnosis not present

## 2022-10-25 DIAGNOSIS — M8000XS Age-related osteoporosis with current pathological fracture, unspecified site, sequela: Secondary | ICD-10-CM | POA: Diagnosis not present

## 2022-11-15 ENCOUNTER — Encounter: Payer: Self-pay | Admitting: Internal Medicine

## 2022-11-15 NOTE — Progress Notes (Addendum)
Future Appointments  Date Time Provider Department  11/16/2022              6 mo OV   2:00 PM Unk Pinto, MD [facility]  02/06/2023  1:00 PM [name], Eustace Quail, DO [facility]  05/08/2023             cpe  2:00 PM Unk Pinto, MD [facility]  Due Medicare Wellness  in Sept 2024 - [place]       History of Present Illness:       This very nice 81 y.o. MWF presents for 6  month follow up with HTN, HLD, Pre-Diabetes, Chronic LBP, Parkinson's Dz  and Vitamin D Deficiency.        Patient has Chronic LBP consequent of DJD/DDD and 3 back surgeries. She also has advanced Parkinson's Dz & is followed by Dr [name]. Patient  has very poor insight & judgement and  has very unstable gait with high fall risk and has hx/o multiple falls and refuses to comply with recommendations to use her wheelchair.         Patient's HTN  predates from  2005 & BP has been controlled at home. Today's BP  is  110/70 .  In 2013, she had a negative Heart Cath.  Patient has had no complaints of any cardiac type chest pain, palpitations, dyspnea /orthopnea / PND, dizziness, claudication or dependent edema.       Hyperlipidemia is not controlled with diet  and she takes her Crestor sporadically. Last Lipids were not at goal :  Lab Results  Component Value Date   CHOL 245 (H) 08/17/2022   HDL 44 (L) 08/17/2022   LDLCALC 150 (H) 08/17/2022   TRIG 329 (H) 08/17/2022   CHOLHDL 5.6 (H) 08/17/2022     Also, the patient has history of PreDiabetes (A1c 6.3% /2011 and 5.9%/2013) and has had no symptoms of reactive hypoglycemia, diabetic polys, paresthesias or visual blurring.  Last A1c was at goal :  Lab Results  Component Value Date   HGBA1C 5.5 05/02/2022                                                      Further, the patient also has history of Vitamin D Deficiency ("23" /2008) and supplements vitamin D without any suspected side-effects. Last vitamin D was at goal :  Lab Results  Component Value Date    VD25OH 83 05/02/2022       Current Outpatient Medications:    acetaminophen (TYLENOL) 500 MG tablet, Take 1,000 mg by mouth 3 (three) times daily., Disp: , Rfl:    albuterol (PROVENTIL HFA;VENTOLIN HFA) 108 (90 Base) MCG/ACT inhaler, Inhale 1-2 puffs into the lungs every 6 (six) hours as needed for wheezing or shortness of breath., Disp: , Rfl:      carbidopa-levodopa (SINEMET IR) 25-100 MG tablet, TAKE 2 TABS AT 5AM, TAKE 1 TAB AT 7AM, TAKE 2 TABS AT 9AM, TAKE 1 TAB AT 11AM, TAKE 2 TABS AT 1PM, TAKE 1 TAB AT 3PM, TAKE 1 TAB AT 5PM, 1 TAB AT BEDTIME, Disp: 990 tablet, Rfl: 0     Cholecalciferol (VITAMIN D3) 5000 units CAPS, Take 5,000 Units by mouth daily., Disp: , Rfl:    diclofenac Sodium (VOLTAREN) 1 % GEL, Apply 4 g topically 4 (four) times  daily., Disp: 350 g, Rfl: 1   dicyclomine (BENTYL) 20 MG tablet, Take  1/2 to 1 tablet   3 x /day  bedfore Meals for abdominal Cramping, Bloating or Diarrhea (Patient taking differently: as needed. Take  1/2 to 1 tablet   3 x /day  bedfore Meals for abdominal Cramping, Bloating or Diarrhea), Disp: 90 tablet, Rfl: 0   hydrochlorothiazide (HYDRODIURIL) 25 MG tablet, Take      1 tablet     Daily      For Fluid Retention / Ankle Swelling, Disp: 90 tablet, Rfl: 1   methocarbamol (ROBAXIN) 500 MG tablet, Take 500 mg by mouth as needed., Disp: , Rfl:     midodrine (PROAMATINE) 10 MG tablet, Take 1/2-1 tab thee times daily for low blood pressure., Disp: 180 tablet, Rfl: 1     Arthritis cream, Disp: , Rfl:    nystatin (MYCOSTATIN/NYSTOP) powder, Apply under breasts 2-3 times a day., Disp: 60 g, Rfl: 2   ondansetron (ZOFRAN-ODT) 4 MG disintegrating tablet, Take 4 mg by mouth every 8 (eight) hours as needed for nausea or vomiting., Disp: , Rfl:    OVER THE COUNTER MEDICATION, Takes Mirlax daily, Disp: , Rfl:    Probiotic Product (ALIGN) 4 MG CAPS, Take 1 capsule by mouth daily., Disp: , Rfl:    rosuvastatin (CRESTOR) 20 MG tablet, Take 1 tablet Daily for  Cholesterol, Disp: 90 tablet, Rfl: 3   sertraline (ZOLOFT) 100 MG tablet, Take  1&1/2 tablets (150 mg)  at Bedtime for Mood, Disp: 405 tablet, Rfl: 3   vitamin B-12 (CYANOCOBALAMIN) 500 MCG tablet, Take 500 mcg by mouth daily., Disp: , Rfl:    Allergies  Allergen Reactions   Lyrica [Pregabalin] Rash and Other (See Comments)    Weight gain and swelling in hands, legs and feet   Macrobid [Nitrofurantoin Macrocrystal] Swelling and Rash   Baclofen Other (See Comments)    Kept her awake, affected memory   Cephalosporins Other (See Comments)    Cannot sleep   Codeine Nausea Only    dizziness   Darvocet [Propoxyphene N-Acetaminophen] Nausea And Vomiting   Diclofenac Rash    The gel caused a rash   Levaquin [Levofloxacin] Hives   Penicillins Rash   Selegiline Hcl Rash   Tramadol Nausea And Vomiting   Triamcinolone Itching     PMHx:   Past Medical History:  Diagnosis Date   Anemia    as young girl and teenager   Arthritis    Chest pain, unspecified    Constipation    Costochondritis    GERD (gastroesophageal reflux disease)    Headache    ocular migraines   History of hiatal hernia    Hyperlipidemia 10-07-12   Never smoked tobacco    Parkinson's disease ([place]) 10-07-12   Parkinson's disease ([place])    Prediabetes    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   Spinal stenosis      Immunization History   Administered Date(s) Administered   Influenza , high dose  09/02/2019, 08/30/2020   Influenza 09/29/2016, 08/08/2017, 08/22/2018   PFIZER-SARS-COV-2 Vacc 12/22/2019, 01/23/2020, 09/28/2020   Pneumococcal - 13 11/10/2015   Pneumococcal   23 02/26/2017   Pneumococcal - 23 11/27/2006   Td 09/08/2013   Zoster, Live 06/27/2006     Past Surgical History:  Procedure Laterality Date   ABDOMINAL HYSTERECTOMY     APPENDECTOMY     BACK SURGERY  12/20/2015   BACK SURGERY  09/27/2017   BACK SURGERY  2020   BUNIONECTOMY Bilateral 11-11-3   CARDIAC CATHETERIZATION Bilateral  2013   COLONOSCOPY     HERNIA REPAIR     LEFT HEART CATHETERIZATION WITH CORONARY ANGIOGRAM N/A 07/03/2012   Procedure: LEFT HEART CATHETERIZATION WITH CORONARY ANGIOGRAM;  Surgeon: Troy Sine, MD;  Location: [facility] CATH LAB;  Service: Cardiovascular;  Laterality: N/A;   REPAIR OF CEREBROSPINAL FLUID LEAK N/A 12/24/2015   Procedure: LUMBAR WOUND EXPLORATION, REPAIR OF CEREBROSPINAL FLUID LEAK, PLACEMENT OF LUMBAR DRAIN;  Surgeon: Kevan Ny Ditty, MD;  Location: [facility];  Service: Neurosurgery;  Laterality: N/A;   ROTATOR CUFF REPAIR Left 10-07-12    FHx:    Reviewed / unchanged  SHx:    Reviewed / unchanged   Systems Review:  Constitutional: Denies fever, chills, wt changes, headaches, insomnia, fatigue, night sweats, change in appetite. Eyes: Denies redness, blurred vision, diplopia, discharge, itchy, watery eyes.  ENT: Denies discharge, congestion, post nasal drip, epistaxis, sore throat, earache, hearing loss, dental pain, tinnitus, vertigo, sinus pain, snoring.  CV: Denies chest pain, palpitations, irregular heartbeat, syncope, dyspnea, diaphoresis, orthopnea, PND, claudication or edema. Respiratory: denies cough, dyspnea, DOE, pleurisy, hoarseness, laryngitis, wheezing.  Gastrointestinal: Denies dysphagia, odynophagia, heartburn, reflux, water brash, abdominal pain or cramps, nausea, vomiting, bloating, diarrhea, constipation, hematemesis, melena, hematochezia  or hemorrhoids. Genitourinary: Denies dysuria, frequency, urgency, nocturia, hesitancy, discharge, hematuria or flank pain. Musculoskeletal: Denies arthralgias, myalgias, stiffness, jt. swelling, pain, limping or strain/sprain.  Skin: Denies pruritus, rash, hives, warts, acne, eczema or change in skin lesion(s). Neuro: No weakness, tremor, incoordination, spasms, paresthesia or pain. Psychiatric: Denies confusion, memory loss or sensory loss. Endo: Denies change in weight, skin or hair change.  Heme/Lymph: No excessive  bleeding, bruising or enlarged lymph nodes.  Physical Exam  BP 110/70   Pulse 89   Temp 97.9 F (36.6 C)   Resp 16   Ht 5' (1.524 m)   Wt 142 lb (64.4 kg)   SpO2 97%   BMI 27.73 kg/m   Appears  well nourished, well groomed  and in no distress.  Eyes: PERRLA, EOMs, conjunctiva no swelling or erythema. Sinuses: No frontal/maxillary tenderness ENT/Mouth: EAC's clear, TM's nl w/o erythema, bulging. Nares clear w/o erythema, swelling, exudates. Oropharynx clear without erythema or exudates. Oral hygiene is good. Tongue normal, non obstructing. Hearing intact.  Neck: Supple. Thyroid not palpable. Car 2+/2+ without bruits, nodes or JVD. Chest: Respirations nl with BS clear & equal w/o rales, rhonchi, wheezing or stridor.  Cor: Heart sounds normal w/ regular rate and rhythm without sig. murmurs, gallops, clicks or rubs. Peripheral pulses normal and equal  without edema.  Abdomen: protuberant, soft with bowel sounds active. Non-tender w/o guarding, rebound, hernias, masses or organomegaly.  Lymphatics: Unremarkable.  Musculoskeletal:  FROM w/o deformity.  Nl muscle tone . Mild  cog-wheeling Lt>RUE. Lateral splaying of  Fingers / MCP jts - Lt>>Rt with Heberden's /Bouchard's  nodes.  Patient is in a wheelchair.  Skin: Warm, dry without exposed rashes, lesions or ecchymosis apparent.  Neuro: Cranial nerves intact, reflexes equal bilaterally. Sensory-motor testing grossly intact. Tendon reflexesflat.[name]  Pysch: Alert & oriented x 3.  Insight and judgement is limited & poor.   Assessment and Plan:   1. Labile hypertension  - Continue medication, monitor blood pressure at home.  - Continue DASH diet.  Reminder to go to the ER if any CP,  SOB, nausea, dizziness, severe HA, changes vision/speech.    - CBC with Differential/Platelet - COMPLETE METABOLIC PANEL WITH GFR -  Magnesium - TSH  2. Idiopathic hypotension  - CBC with Differential/Platelet - COMPLETE METABOLIC PANEL WITH GFR -  Magnesium  - Continue diet/meds, exercise,& lifestyle modifications.  - Continue monitor periodic cholesterol/liver & renal functions      3. Hyperlipidemia, mixed  - Lipid panel - TSH  4. Abnormal glucose  - Continue diet, exercise  - Lifestyle modifications.  - Monitor appropriate labs    - Hemoglobin A1c - Insulin, random  5. Vitamin D deficiency  - Continue supplementation   - VITAMIN D 25 Hydroxy    6. Parkinson's disease   7. Unstable gait   8. At high risk for falls   9. Medication management  - CBC with Differential/Platelet - COMPLETE METABOLIC PANEL WITH GFR - Magnesium - Lipid panel - TSH - Hemoglobin A1c - Insulin, random - VITAMIN D 25 Hydroxy          Discussed  regular exercise, BP monitoring, weight control to achieve/maintain BMI less than 25 and discussed med and SE's. Recommended labs to assess and monitor clinical status with further disposition pending results of labs.  I discussed the assessment and treatment plan with the patient. The patient was provided an opportunity to ask questions and all were answered. The patient agreed with the plan and demonstrated an understanding of the instructions.  I provided over 30 minutes of exam, counseling, chart review and  complex critical decision making.        The patient was advised to call back or seek an in-person evaluation if the symptoms worsen or if the condition fails to improve as anticipated.   Kirtland Bouchard, MD

## 2022-11-15 NOTE — Patient Instructions (Signed)

## 2022-11-16 ENCOUNTER — Ambulatory Visit (INDEPENDENT_AMBULATORY_CARE_PROVIDER_SITE_OTHER): Payer: Medicare Other | Admitting: Internal Medicine

## 2022-11-16 ENCOUNTER — Encounter: Payer: Self-pay | Admitting: Internal Medicine

## 2022-11-16 VITALS — BP 110/70 | HR 89 | Temp 97.9°F | Resp 16 | Ht 60.0 in | Wt 142.0 lb

## 2022-11-16 DIAGNOSIS — Z79899 Other long term (current) drug therapy: Secondary | ICD-10-CM | POA: Diagnosis not present

## 2022-11-16 DIAGNOSIS — R7309 Other abnormal glucose: Secondary | ICD-10-CM

## 2022-11-16 DIAGNOSIS — R0989 Other specified symptoms and signs involving the circulatory and respiratory systems: Secondary | ICD-10-CM | POA: Diagnosis not present

## 2022-11-16 DIAGNOSIS — R2681 Unsteadiness on feet: Secondary | ICD-10-CM

## 2022-11-16 DIAGNOSIS — E782 Mixed hyperlipidemia: Secondary | ICD-10-CM | POA: Diagnosis not present

## 2022-11-16 DIAGNOSIS — E559 Vitamin D deficiency, unspecified: Secondary | ICD-10-CM | POA: Diagnosis not present

## 2022-11-16 DIAGNOSIS — I950 Idiopathic hypotension: Secondary | ICD-10-CM | POA: Diagnosis not present

## 2022-11-16 DIAGNOSIS — G20A2 Parkinson's disease without dyskinesia, with fluctuations: Secondary | ICD-10-CM | POA: Diagnosis not present

## 2022-11-16 DIAGNOSIS — Z9181 History of falling: Secondary | ICD-10-CM | POA: Diagnosis not present

## 2022-11-17 LAB — COMPLETE METABOLIC PANEL WITH GFR
AG Ratio: 2 (calc) (ref 1.0–2.5)
ALT: 5 U/L — ABNORMAL LOW (ref 6–29)
AST: 11 U/L (ref 10–35)
Albumin: 4.3 g/dL (ref 3.6–5.1)
Alkaline phosphatase (APISO): 93 U/L (ref 37–153)
BUN/Creatinine Ratio: 27 (calc) — ABNORMAL HIGH (ref 6–22)
BUN: 33 mg/dL — ABNORMAL HIGH (ref 7–25)
CO2: 24 mmol/L (ref 20–32)
Calcium: 9.5 mg/dL (ref 8.6–10.4)
Chloride: 108 mmol/L (ref 98–110)
Creat: 1.22 mg/dL — ABNORMAL HIGH (ref 0.60–0.95)
Globulin: 2.2 g/dL (calc) (ref 1.9–3.7)
Glucose, Bld: 99 mg/dL (ref 65–99)
Potassium: 4.5 mmol/L (ref 3.5–5.3)
Sodium: 141 mmol/L (ref 135–146)
Total Bilirubin: 0.4 mg/dL (ref 0.2–1.2)
Total Protein: 6.5 g/dL (ref 6.1–8.1)
eGFR: 45 mL/min/{1.73_m2} — ABNORMAL LOW (ref >=60)

## 2022-11-17 LAB — HEMOGLOBIN A1C
Hgb A1c MFr Bld: 5.7 % of total Hgb — ABNORMAL HIGH (ref <5.7)
Mean Plasma Glucose: 117 mg/dL
eAG (mmol/L): 6.5 mmol/L

## 2022-11-17 LAB — VITAMIN D 25 HYDROXY (VIT D DEFICIENCY, FRACTURES): Vit D, 25-Hydroxy: 78 ng/mL (ref 30–100)

## 2022-11-17 LAB — LIPID PANEL
Cholesterol: 250 mg/dL — ABNORMAL HIGH (ref <200)
HDL: 47 mg/dL — ABNORMAL LOW (ref >=50)
LDL Cholesterol (Calc): 157 mg/dL (calc) — ABNORMAL HIGH
Non-HDL Cholesterol (Calc): 203 mg/dL (calc) — ABNORMAL HIGH (ref <130)
Total CHOL/HDL Ratio: 5.3 (calc) — ABNORMAL HIGH (ref <5.0)
Triglycerides: 298 mg/dL — ABNORMAL HIGH (ref <150)

## 2022-11-17 LAB — CBC WITH DIFFERENTIAL/PLATELET
Absolute Monocytes: 465 cells/uL (ref 200–950)
Basophils Absolute: 19 cells/uL (ref 0–200)
Basophils Relative: 0.4 %
Eosinophils Absolute: 89 cells/uL (ref 15–500)
Eosinophils Relative: 1.9 %
HCT: 36.1 % (ref 35.0–45.0)
Hemoglobin: 12 g/dL (ref 11.7–15.5)
Lymphs Abs: 1006 cells/uL (ref 850–3900)
MCH: 30.8 pg (ref 27.0–33.0)
MCHC: 33.2 g/dL (ref 32.0–36.0)
MCV: 92.8 fL (ref 80.0–100.0)
MPV: 10.8 fL (ref 7.5–12.5)
Monocytes Relative: 9.9 %
Neutro Abs: 3121 cells/uL (ref 1500–7800)
Neutrophils Relative %: 66.4 %
Platelets: 250 10*3/uL (ref 140–400)
RBC: 3.89 10*6/uL (ref 3.80–5.10)
RDW: 12.5 % (ref 11.0–15.0)
Total Lymphocyte: 21.4 %
WBC: 4.7 10*3/uL (ref 3.8–10.8)

## 2022-11-17 LAB — MAGNESIUM: Magnesium: 2.1 mg/dL (ref 1.5–2.5)

## 2022-11-17 LAB — TSH: TSH: 1.15 mIU/L (ref 0.40–4.50)

## 2022-11-17 LAB — INSULIN, RANDOM: Insulin: 29.6 u[IU]/mL — ABNORMAL HIGH

## 2022-11-19 NOTE — Progress Notes (Signed)
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 - So you don't dend up on Dialysis if you don't improve your fluid intake  !   <><><><><><><><><><><><><><><><><><><><><><><><><><><><><><><><><> <><><><><><><><><><><><><><><><><><><><><><><><><><><><><><><><><>   - Total Chol =  250    is very high risk for Heart Attack /Stroke /Vascular Dementia     ( Ideal or Goal is less than 180 ! )  & - Bad /Dangerous LDL Chol =157     - - >> Sitting on a time Bomb !     ( Ideal or Goal is less than 70 ! )   - Treating with meds to lower Cholesterol is treating the result                                          & NOT treating the cause  - The cause is Bad Diet !  - But need to go ahead & RESTART your Crestor / Rosuvastatin    until get on a better diet to try &                                                          reverse some of the Damage already done   - Read or listen to   Dr Alden Benjamin 's book    " How Not to Die ! "    - Recommend a stricter plant based low cholesterol diet   - Cholesterol only comes from animal sources                                                                                   - ie. meat, dairy, egg yolks  - Eat all the vegetables you want.    - Avoid Meat, Avoid Meat , Avoid Meat  ! ! !                                                                          -  especially red meat - Beef AND Pork  - Avoid cheese, egg yolks  & dairy - milk & ice cream.   - Cheese is the most concentrated form of trans-fats which                                                                 is the worst thing to clog up our arteries.   - Veggie cheese is OK which can be found in                                              the fresh produce section at                                                          [employer] or [employer] or [employer]  <><><><><><><><><><><><><><><><><><><><><><><><><><><><><><><><><> <><><><><><><><><><><><><><><><><><><><><><><><><><><><><><><><><>  -  A;lso Triglycerides (  298   ) or fats in blood are too high                 (   Ideal or  Goal is less than 150  !  )    - Recommend avoid fried & greasy foods,  sweets / candy,   - Avoid white rice  (brown or wild rice or Quinoa is OK),   - Avoid white potatoes  (sweet potatoes are OK)   - Avoid anything made from white flour  - bagels, doughnuts, rolls, buns, biscuits, white and   wheat breads, pizza crust and traditional  pasta made of white flour & egg white  - (vegetarian pasta or spinach or wheat pasta is OK).    - Multi-grain bread is OK - like multi-grain flat bread or  sandwich thins.   - Avoid alcohol in excess.   - Exercise is also important. <><><><><><><><><><><><><><><><><><><><><><><><><><><><><><><><><> <><><><><><><><><><><><><><><><><><><><><><><><><><><><><><><><><>  -  A1c = 5.7% [  Blood sugar and A1c are NOW  elevated in the borderline and                                                               early or pre-diabetes range which has the same   300% increased risk for heart attack, stroke, cancer and   alzheimer- type vascular dementia as full blown diabetes.   But the good news is that diet, exercise with weight loss can  cure the early diabetes at this  point.                                                                                       ( With better diet )  <><><><><><><><><><><><><><><><><><><><><><><><><><><><><><><><><> <><><><><><><><><><><><><><><><><><><><><><><><><><><><><><><><><>  -  Vit D = 78 - Excellent - Please continue dose same   <><><><><><><><><><><><><><><><><><><><><><><><><><><><><><><><><> <><><><><><><><><><><><><><><><><><><><><><><><><><><><><><><><><>

## 2022-12-11 ENCOUNTER — Telehealth: Payer: Self-pay | Admitting: Neurology

## 2022-12-11 NOTE — Telephone Encounter (Signed)
I called patient to switch her appt on 02-06-23  to a Virtual visit as Dr [name] will only be doing Virtual visits that afternoon. Patient states that she was not able to do a Virtual visit and stated telling me that she is having hallucinations. She's is seeing things that her  husband is not able to see and she needed to be seen sooner than 02-06-23.  She was asking me if this is something that we have a lot of patients having and etc

## 2022-12-11 NOTE — Telephone Encounter (Signed)
Called patient left voicemail message 

## 2023-01-15 ENCOUNTER — Telehealth: Payer: Self-pay | Admitting: Anesthesiology

## 2023-01-15 NOTE — Telephone Encounter (Signed)
Called patient and explained that she has cancelled her last two appointments. She is seeing her PCP this week and we can put her on CX list in case something did become available for her

## 2023-01-15 NOTE — Telephone Encounter (Signed)
Pt called stating she's been having hallucinations x 4 weeks. She is seeing a woman sitting down in her den. She would like to see Dr [name] for this problem be seen as soon as possible.

## 2023-01-18 ENCOUNTER — Encounter: Payer: Self-pay | Admitting: Nurse Practitioner

## 2023-01-18 ENCOUNTER — Ambulatory Visit (INDEPENDENT_AMBULATORY_CARE_PROVIDER_SITE_OTHER): Payer: Medicare Other | Admitting: Nurse Practitioner

## 2023-01-18 VITALS — BP 122/64 | HR 85 | Temp 97.3°F | Ht 60.0 in | Wt 143.0 lb

## 2023-01-18 DIAGNOSIS — G20B2 Parkinson's disease with dyskinesia, with fluctuations: Secondary | ICD-10-CM | POA: Diagnosis not present

## 2023-01-18 DIAGNOSIS — K117 Disturbances of salivary secretion: Secondary | ICD-10-CM | POA: Diagnosis not present

## 2023-01-18 NOTE — Progress Notes (Signed)
Assessment and Plan:  Alexandra Lindsey was seen today for an episodic visit.  Diagnoses and all order for this visit:  Drooling from left side of mouth Mouth exam WNL. Continue to monitor  Parkinson's disease with dyskinesia and fluctuating manifestations Neurology following Continue medications as directed.  Notify office for further evaluation and treatment, questions or concerns if s/s fail to improve. The risks and benefits of my recommendations, as well as other treatment options were discussed with the patient today. Questions were answered.  Further disposition pending results of labs. Discussed med's effects and SE's.    Over 15 minutes of exam, counseling, chart review, and critical decision making was performed.   Future Appointments  Date Time Provider [place]  02/15/2023  1:00 PM [name], Eustace Quail, DO [facility] None  03/08/2023  2:30 PM Darrol Jump, NP [facility] None  07/12/2023  2:00 PM Unk Pinto, MD [facility] None    ------------------------------------------------------------------------------------------------------------------   HPI BP 122/64   Pulse 85   Temp (!) 97.3 F (36.3 C)   Ht 5' (1.524 m)   Wt 143 lb (64.9 kg)   SpO2 98%   BMI 27.93 kg/m   82 y.o.female presents for evaluation of left side feeling of clear fluid leaking from left cheek.  She does note drooling from he right side of her cheek but feels as though she has not noticed before on the left side.  She does not note any other drainage from areas of nose, ear or eyes.  She denies any  mouth sores or pain.  She does not feel as though she is biting her cheek area.  She denies any recent URI symptoms.    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   Spinal stenosis      Allergies  Allergen Reactions   Lyrica [Pregabalin] Rash and Other (See Comments)    Weight gain and swelling in hands, legs and feet   Macrobid [Nitrofurantoin Macrocrystal] Swelling and Rash   Baclofen Other (See Comments)    Kept her awake, affected memory   Cephalosporins Other (See Comments)    Cannot sleep   Codeine Nausea Only    dizziness   Darvocet [Propoxyphene N-Acetaminophen] Nausea And Vomiting   Diclofenac Rash    The gel caused a rash   Levaquin [Levofloxacin] Hives   Penicillins Rash   Selegiline Hcl Rash   Tramadol Nausea And Vomiting   Triamcinolone Itching    Current Outpatient Medications on File Prior to Visit  Medication Sig   acetaminophen (TYLENOL) 500 MG tablet Take 1,000 mg by mouth 3 (three) times daily.   albuterol (PROVENTIL HFA;VENTOLIN HFA) 108 (90 Base) MCG/ACT inhaler Inhale 1-2 puffs into the lungs every 6 (six) hours as needed for wheezing or shortness of breath.   carbidopa-levodopa (SINEMET IR) 25-100 MG tablet TAKE 2 TABS AT 5AM, TAKE 1 TAB AT 7AM, TAKE 2 TABS AT 9AM, TAKE 1 TAB AT 11AM, TAKE 2 TABS AT 1PM, TAKE 1 TAB AT 3PM, TAKE 1 TAB AT  5PM, 1 TAB AT BEDTIME   Cholecalciferol (VITAMIN D3) 5000 units CAPS Take 5,000 Units by mouth daily.   diclofenac Sodium (VOLTAREN) 1 % GEL Apply 4 g topically 4 (four) times daily.   dicyclomine (BENTYL) 20 MG tablet Take  1/2 to 1 tablet   3 x /day  bedfore Meals for abdominal Cramping, Bloating or Diarrhea (Patient taking differently: as needed. Take  1/2 to 1 tablet   3 x /day  bedfore Meals for abdominal Cramping, Bloating or Diarrhea)   hydrochlorothiazide (HYDRODIURIL) 25 MG tablet Take      1 tablet     Daily      For Fluid Retention / Ankle Swelling   methocarbamol (ROBAXIN) 500 MG tablet Take 500 mg by mouth as  needed.   midodrine (PROAMATINE) 10 MG tablet Take 1/2-1 tab thee times daily for low blood pressure.   NON FORMULARY Arthritis cream   nystatin (MYCOSTATIN/NYSTOP) powder Apply under breasts 2-3 times a day.   ondansetron (ZOFRAN-ODT) 4 MG disintegrating tablet Take 4 mg by mouth every 8 (eight) hours as needed for nausea or vomiting.   OVER THE COUNTER MEDICATION Takes Mirlax daily   Probiotic Product (ALIGN) 4 MG CAPS Take 1 capsule by mouth daily.   rosuvastatin (CRESTOR) 20 MG tablet Take 1 tablet Daily for Cholesterol   sertraline (ZOLOFT) 100 MG tablet Take  1&1/2 tablets (150 mg)  at Bedtime for Mood                                   /                                       TAKE                   BY                       MOUTH   vitamin B-12 (CYANOCOBALAMIN) 500 MCG tablet Take 500 mcg by mouth daily.   No current facility-administered medications on file prior to visit.    ROS: all negative except what is noted in the HPI.   Physical Exam:  BP 122/64   Pulse 85   Temp (!) 97.3 F (36.3 C)   Ht 5' (1.524 m)   Wt 143 lb (64.9 kg)   SpO2 98%   BMI 27.93 kg/m   General Appearance: NAD.  Awake, conversant and cooperative. Eyes: PERRLA, EOMs intact.  Sclera white.  Conjunctiva without erythema. Sinuses: No frontal/maxillary tenderness.  No nasal discharge. Nares patent.  ENT/Mouth: Ext aud canals clear.  Bilateral TMs w/DOL and without erythema or bulging. Hearing intact.  Posterior pharynx without swelling or exudate.  Tonsils without swelling or erythema.  Neck: Supple.  No masses, nodules or thyromegaly. Respiratory: Effort is regular with non-labored breathing. Breath sounds are equal bilaterally without rales, rhonchi, wheezing or stridor.  Cardio: RRR with no MRGs. Brisk peripheral pulses without edema.  Abdomen: Active BS in all four quadrants.  Soft and non-tender without guarding, rebound tenderness, hernias or masses. Lymphatics: Non tender without lymphadenopathy.   Musculoskeletal: Full ROM, 5/5 strength, normal ambulation.  No clubbing or cyanosis. Skin: Appropriate color for ethnicity. Warm without rashes, lesions, ecchymosis, ulcers.  Neuro: CN II-XII grossly normal. Normal muscle tone without cerebellar symptoms  and intact sensation.   Psych: AO X 3,  appropriate mood and affect, insight and judgment.     Darrol Jump, NP 2:52 PM [facility]

## 2023-01-19 ENCOUNTER — Other Ambulatory Visit: Payer: Self-pay | Admitting: Neurology

## 2023-01-19 DIAGNOSIS — G20A1 Parkinson's disease without dyskinesia, without mention of fluctuations: Secondary | ICD-10-CM

## 2023-01-23 NOTE — Progress Notes (Unsigned)
Assessment/Plan:   1.  Parkinsons Disease  -take carbidopa/levodopa 25/100 on the following schedule: , 2 at 5am/1 at 7am/2 at 9am/1 at 11am/ 2 at 1pm/ 1 at 3pm/1 at 5pm and patient has added 1 at bedtime.   -continues to have multiple falls.  Discussed again that she is not to walk at all.  Have discussed this for several years.  This would avoid the falls.  Her husband has done a great job and is a great help in the home and is supportive of this  -Has declined power wheelchair.    -discussed Parkinsons Disease dance class which can be done when sitting  -discussed RSB which can be done while sitting  2.  PDD with Parkinson's hallucinations  -She only tried Nuplazid for a week and thought it caused swelling.  That would be an unusual side effect.  Discussed with her again that we could retry this.  3.  Neurogenic Orthostatic Hypotension  -pt rarely using midodrine.    4.  Constipation  -taking miralax regularly.  Discussed increasing water intake.  Husband trying to encourage this.    5.  Sialorrhea  -This is commonly associated with PD.  We talked about treatments.  The patient is not a candidate for oral anticholinergic therapy because of increased risk of confusion and falls.  We discussed Botox (type A and B) and 1% atropine drops.  We discusssed that candy like lemon drops can help by stimulating mm of the oropharynx to induce swallowing.  Subjective:   Alexandra Lindsey was seen today in follow up for Parkinsons disease.  My previous records were reviewed prior to todays visit as well as outside records available to me.   This patient is accompanied in the office by her  husband  who supplements the history.  Patient last seen in the office in August.  Medical records are reviewed.  Nurse practitioner did a mobility evaluation in November for a power wheelchair.  Patient had previously declined power wheelchair with me.  She has not yet received that.  She called more recently to  complain about hallucinations.  This has been a long-term issue, but stopped Nuplazid after a week stating that it caused swelling.  She also went to the nurse practitioner within the last week or so complaining about drooling.   Current prescribed movement disorder medications: Carbidopa/levodopa 25/100,  2 at 5am/1 at 7am/2 at 9am/1 at 11am/ 2 at 1pm/ 1 at 3pm/1 at 5pm (and 1 at bed) Midodrine 10 mg 3 times per day (states that only taking prn based on BP and states that she doesn't need to do it often - lightheaded spell few days/week)  PREVIOUS MEDICATIONS: Pramipexole (stopped because of hallucinations); stalevo; klonopin; rytary; nuplazid (states took it for a week and it made her swell); carbidopa/levodopa 50/200 at bedtime (patient just did not take it long after giving it and stopped for unknown reason)  ALLERGIES:   Allergies  Allergen Reactions   Lyrica [Pregabalin] Rash and Other (See Comments)    Weight gain and swelling in hands, legs and feet   Macrobid [Nitrofurantoin Macrocrystal] Swelling and Rash   Baclofen Other (See Comments)    Kept her awake, affected memory   Cephalosporins Other (See Comments)    Cannot sleep   Codeine Nausea Only    dizziness   Darvocet [Propoxyphene N-Acetaminophen] Nausea And Vomiting   Diclofenac Rash    The gel caused a rash   Levaquin [Levofloxacin] Hives  Penicillins Rash   Selegiline Hcl Rash   Tramadol Nausea And Vomiting   Triamcinolone Itching    CURRENT MEDICATIONS:  Outpatient Encounter Medications as of 01/24/2023  Medication Sig   acetaminophen (TYLENOL) 500 MG tablet Take 1,000 mg by mouth 3 (three) times daily.   albuterol (PROVENTIL HFA;VENTOLIN HFA) 108 (90 Base) MCG/ACT inhaler Inhale 1-2 puffs into the lungs every 6 (six) hours as needed for wheezing or shortness of breath.   carbidopa-levodopa (SINEMET IR) 25-100 MG tablet TAKE 2 TABS AT 5AM, 1 TAB AT 7AM, 2 TABS AT 9AM, 1 TAB AT 11AM, 2 TABS AT 1PM, 1 TAB AT 3PM, 1  TAB AT 5PM, AND 1 TAB AT BEDTIME   Cholecalciferol (VITAMIN D3) 5000 units CAPS Take 5,000 Units by mouth daily.   diclofenac Sodium (VOLTAREN) 1 % GEL Apply 4 g topically 4 (four) times daily.   dicyclomine (BENTYL) 20 MG tablet Take  1/2 to 1 tablet   3 x /day  bedfore Meals for abdominal Cramping, Bloating or Diarrhea (Patient taking differently: as needed. Take  1/2 to 1 tablet   3 x /day  bedfore Meals for abdominal Cramping, Bloating or Diarrhea)   hydrochlorothiazide (HYDRODIURIL) 25 MG tablet Take      1 tablet     Daily      For Fluid Retention / Ankle Swelling   methocarbamol (ROBAXIN) 500 MG tablet Take 500 mg by mouth as needed.   midodrine (PROAMATINE) 10 MG tablet Take 1/2-1 tab thee times daily for low blood pressure.   NON FORMULARY Arthritis cream   nystatin (MYCOSTATIN/NYSTOP) powder Apply under breasts 2-3 times a day.   ondansetron (ZOFRAN-ODT) 4 MG disintegrating tablet Take 4 mg by mouth every 8 (eight) hours as needed for nausea or vomiting.   OVER THE COUNTER MEDICATION Takes Mirlax daily   Probiotic Product (ALIGN) 4 MG CAPS Take 1 capsule by mouth daily.   rosuvastatin (CRESTOR) 20 MG tablet Take 1 tablet Daily for Cholesterol   sertraline (ZOLOFT) 100 MG tablet Take  1&1/2 tablets (150 mg)  at Bedtime for Mood                                   /                                       TAKE                   BY                       MOUTH   vitamin B-12 (CYANOCOBALAMIN) 500 MCG tablet Take 500 mcg by mouth daily.   No facility-administered encounter medications on file as of 01/24/2023.    Objective:   PHYSICAL EXAMINATION:    VITALS:   There were no vitals filed for this visit.   GEN:  The patient appears stated age and is in NAD. HEENT:  Normocephalic, atraumatic.  The mucous membranes are moist. The superficial temporal arteries are without ropiness or tenderness.   Neurological examination:  Orientation: The patient is alert and oriented x3. Cranial  nerves: There is good facial symmetry with facial hypomimia. The speech is fluent and clear.  She is slightly hypophonic.  Soft palate rises symmetrically and there is no tongue deviation.  Hearing is intact to conversational tone. Sensation: Sensation is intact to light touch throughout Motor: Strength is at least antigravity x4.  Movement examination: Tone: There is normal tone in the UE/LE Abnormal movements: No dyskinesia today (and reports not having any at home). Coordination:  There is no decremation in the upper or lower extremities, but testing in the left hand is limited because of tendon/orthopedic issues. Gait and Station: Not tested today (because I really do not want her walking or encouraging her to walk).  I have reviewed and interpreted the following labs independently    Chemistry      Component Value Date/Time   NA 141 11/16/2022 1459   K 4.5 11/16/2022 1459   CL 108 11/16/2022 1459   CO2 24 11/16/2022 1459   BUN 33 (H) 11/16/2022 1459   CREATININE 1.22 (H) 11/16/2022 1459      Component Value Date/Time   CALCIUM 9.5 11/16/2022 1459   ALKPHOS 94 11/17/2016 1154   AST 11 11/16/2022 1459   ALT 5 (L) 11/16/2022 1459   BILITOT 0.4 11/16/2022 1459       Lab Results  Component Value Date   WBC 4.7 11/16/2022   HGB 12.0 11/16/2022   HCT 36.1 11/16/2022   MCV 92.8 11/16/2022   PLT 250 11/16/2022    Lab Results  Component Value Date   TSH 1.15 11/16/2022     Total time spent on today's visit was *** minutes, including both face-to-face time and nonface-to-face time.  Time included that spent on review of records (prior notes available to me/labs/imaging if pertinent), discussing treatment and goals, answering patient's questions and coordinating care.  Cc:  Unk Pinto, MD

## 2023-01-24 ENCOUNTER — Ambulatory Visit (INDEPENDENT_AMBULATORY_CARE_PROVIDER_SITE_OTHER): Payer: Medicare Other | Admitting: Neurology

## 2023-01-24 ENCOUNTER — Encounter: Payer: Self-pay | Admitting: Neurology

## 2023-01-24 ENCOUNTER — Other Ambulatory Visit: Payer: Medicare Other

## 2023-01-24 VITALS — BP 118/82 | HR 58 | Ht 60.0 in | Wt 143.0 lb

## 2023-01-24 DIAGNOSIS — R404 Transient alteration of awareness: Secondary | ICD-10-CM | POA: Diagnosis not present

## 2023-01-24 DIAGNOSIS — R441 Visual hallucinations: Secondary | ICD-10-CM

## 2023-01-24 DIAGNOSIS — G20A1 Parkinson's disease without dyskinesia, without mention of fluctuations: Secondary | ICD-10-CM

## 2023-01-24 DIAGNOSIS — F068 Other specified mental disorders due to known physiological condition: Secondary | ICD-10-CM | POA: Diagnosis not present

## 2023-01-24 DIAGNOSIS — G20B2 Parkinson's disease with dyskinesia, with fluctuations: Secondary | ICD-10-CM | POA: Diagnosis not present

## 2023-01-24 MED ORDER — NUPLAZID 34 MG PO CAPS: ORAL_CAPSULE | ORAL | 0 refills | Status: DC

## 2023-01-24 MED ORDER — NUPLAZID 34 MG PO CAPS: 1.0000 | ORAL_CAPSULE | Freq: Every day | ORAL | 0 refills | Status: DC

## 2023-01-24 NOTE — Addendum Note (Signed)
Addended by: Patricia Nettle on: 01/24/2023 02:59 PM   Modules accepted: Orders

## 2023-01-24 NOTE — Patient Instructions (Addendum)
Your provider has requested that you have labwork completed today. The lab is located on the Second floor at [place], within the [facility] office. When you get off the elevator, turn right and go in the [facility] [address]; the first brown door on the left.  Tell the ladies behind the desk that you are there for lab work. If you are not called within 15 minutes please check with the front desk.   Once you complete your labs you are free to go. You will receive a call or message via MyChart with your lab results.    Start nuplazid 34 mg daily.  This is for the hallucinations.  A referral to [facility] has been placed for your CT brain someone will contact you directly to schedule your appt. They are located at [place]. Please contact them directly by calling [phone] with any questions regarding your referral.

## 2023-01-28 LAB — UA/M W/RFLX CULTURE, COMP
Bilirubin, UA: NEGATIVE
Glucose, UA: NEGATIVE
Nitrite, UA: NEGATIVE
Protein,UA: NEGATIVE
RBC, UA: NEGATIVE
Specific Gravity, UA: >=1.03 — AB (ref 1.005–1.030)
Urobilinogen, Ur: 0.2 mg/dL (ref 0.2–1.0)
pH, UA: 5.5 (ref 5.0–7.5)

## 2023-01-28 LAB — MICROSCOPIC EXAMINATION
Casts: NONE SEEN /lpf
Epithelial Cells (non renal): NONE SEEN /hpf (ref 0–10)

## 2023-01-28 LAB — URINE CULTURE, COMPREHENSIVE

## 2023-01-29 ENCOUNTER — Other Ambulatory Visit: Payer: Self-pay | Admitting: Internal Medicine

## 2023-01-29 DIAGNOSIS — N390 Urinary tract infection, site not specified: Secondary | ICD-10-CM

## 2023-01-29 MED ORDER — SULFAMETHOXAZOLE-TRIMETHOPRIM 800-160 MG PO TABS: ORAL_TABLET | ORAL | 0 refills | Status: DC

## 2023-01-31 ENCOUNTER — Telehealth: Payer: Self-pay | Admitting: Anesthesiology

## 2023-01-31 ENCOUNTER — Other Ambulatory Visit: Payer: Self-pay | Admitting: Internal Medicine

## 2023-01-31 DIAGNOSIS — N390 Urinary tract infection, site not specified: Secondary | ICD-10-CM

## 2023-01-31 MED ORDER — CEFUROXIME AXETIL 250 MG PO TABS: ORAL_TABLET | ORAL | 0 refills | Status: DC

## 2023-01-31 NOTE — Progress Notes (Signed)
Patient states that she has a rash and welts on her body due to the Bactrim. Provider aware and sent in new prescription. Patient advised also to take Zyrtec twice a day when she takes the new antibiotic.

## 2023-01-31 NOTE — Telephone Encounter (Signed)
Hold Nuplazid until done with antibiotics and if the girl is still present she can start  Nuplazid

## 2023-01-31 NOTE — Telephone Encounter (Signed)
Pt left message stating she was diagnosed with UTI by Dr Melford Aase, states she has questions about her hallucinations.

## 2023-01-31 NOTE — Telephone Encounter (Signed)
Called patient and let her know of the medicaiton

## 2023-02-06 ENCOUNTER — Ambulatory Visit: Payer: Medicare Other | Admitting: Neurology

## 2023-02-13 ENCOUNTER — Other Ambulatory Visit: Payer: Self-pay | Admitting: Neurology

## 2023-02-13 DIAGNOSIS — G20A1 Parkinson's disease without dyskinesia, without mention of fluctuations: Secondary | ICD-10-CM

## 2023-02-14 ENCOUNTER — Other Ambulatory Visit: Payer: Self-pay

## 2023-02-14 ENCOUNTER — Ambulatory Visit (INDEPENDENT_AMBULATORY_CARE_PROVIDER_SITE_OTHER): Payer: Medicare Other

## 2023-02-14 DIAGNOSIS — B9620 Unspecified Escherichia coli [E. coli] as the cause of diseases classified elsewhere: Secondary | ICD-10-CM | POA: Diagnosis not present

## 2023-02-14 DIAGNOSIS — G20A1 Parkinson's disease without dyskinesia, without mention of fluctuations: Secondary | ICD-10-CM

## 2023-02-14 DIAGNOSIS — N390 Urinary tract infection, site not specified: Secondary | ICD-10-CM | POA: Diagnosis not present

## 2023-02-14 NOTE — Progress Notes (Signed)
The patient returns for repeat UA/Culture to see if her UTI has cleared at this time. She does not report any new issues or concerns today and reports that she is feeling some better.

## 2023-02-15 ENCOUNTER — Ambulatory Visit: Payer: Medicare Other | Admitting: Neurology

## 2023-02-15 LAB — URINE CULTURE
MICRO NUMBER:: 14719025
SPECIMEN QUALITY:: ADEQUATE

## 2023-02-15 LAB — URINALYSIS, ROUTINE W REFLEX MICROSCOPIC
Bilirubin Urine: NEGATIVE
Glucose, UA: NEGATIVE
Hgb urine dipstick: NEGATIVE
Leukocytes,Ua: NEGATIVE
Nitrite: NEGATIVE
Protein, ur: NEGATIVE
Specific Gravity, Urine: 1.04 — ABNORMAL HIGH (ref 1.001–1.035)
pH: <=5 (ref 5.0–8.0)

## 2023-02-15 NOTE — Progress Notes (Signed)
<><><><><><><><><><><><><><><><><><><><><><><><><><><><><><><><><> <><><><><><><><><><><><><><><><><><><><><><><><><><><><><><><><><>  -   Urine culture - Negative - >No Infection ........ So                                                                           No Antibiotic needed   <><><><><><><><><><><><><><><><><><><><><><><><><><><><><><><><><> <><><><><><><><><><><><><><><><><><><><><><><><><><><><><><><><><>

## 2023-02-18 ENCOUNTER — Other Ambulatory Visit: Payer: Self-pay | Admitting: Neurology

## 2023-02-18 DIAGNOSIS — G20A1 Parkinson's disease without dyskinesia, without mention of fluctuations: Secondary | ICD-10-CM

## 2023-02-22 ENCOUNTER — Ambulatory Visit
Admission: RE | Admit: 2023-02-22 | Discharge: 2023-02-22 | Disposition: A | Discharge status: Home / Self Care | Payer: Medicare Other | Source: Ambulatory Visit | Attending: Neurology | Admitting: Neurology

## 2023-02-22 DIAGNOSIS — R404 Transient alteration of awareness: Secondary | ICD-10-CM

## 2023-02-22 DIAGNOSIS — R4182 Altered mental status, unspecified: Secondary | ICD-10-CM | POA: Diagnosis not present

## 2023-02-22 DIAGNOSIS — R441 Visual hallucinations: Secondary | ICD-10-CM

## 2023-02-22 DIAGNOSIS — G20A1 Other specified mental disorders due to known physiological condition: Secondary | ICD-10-CM

## 2023-02-22 DIAGNOSIS — R443 Hallucinations, unspecified: Secondary | ICD-10-CM | POA: Diagnosis not present

## 2023-03-08 ENCOUNTER — Ambulatory Visit (INDEPENDENT_AMBULATORY_CARE_PROVIDER_SITE_OTHER): Payer: Medicare Other | Admitting: Nurse Practitioner

## 2023-03-08 ENCOUNTER — Encounter: Payer: Self-pay | Admitting: Nurse Practitioner

## 2023-03-08 VITALS — BP 112/70 | HR 80 | Temp 97.6°F | Ht 60.0 in | Wt 143.0 lb

## 2023-03-08 DIAGNOSIS — G20B2 Parkinson's disease with dyskinesia, with fluctuations: Secondary | ICD-10-CM | POA: Diagnosis not present

## 2023-03-08 DIAGNOSIS — K5900 Constipation, unspecified: Secondary | ICD-10-CM | POA: Diagnosis not present

## 2023-03-08 DIAGNOSIS — M48062 Spinal stenosis, lumbar region with neurogenic claudication: Secondary | ICD-10-CM

## 2023-03-08 DIAGNOSIS — K219 Gastro-esophageal reflux disease without esophagitis: Secondary | ICD-10-CM

## 2023-03-08 DIAGNOSIS — K589 Irritable bowel syndrome without diarrhea: Secondary | ICD-10-CM

## 2023-03-08 DIAGNOSIS — Z79899 Other long term (current) drug therapy: Secondary | ICD-10-CM

## 2023-03-08 DIAGNOSIS — E559 Vitamin D deficiency, unspecified: Secondary | ICD-10-CM | POA: Diagnosis not present

## 2023-03-08 DIAGNOSIS — I10 Essential (primary) hypertension: Secondary | ICD-10-CM

## 2023-03-08 DIAGNOSIS — R7309 Other abnormal glucose: Secondary | ICD-10-CM

## 2023-03-08 DIAGNOSIS — E782 Mixed hyperlipidemia: Secondary | ICD-10-CM

## 2023-03-08 DIAGNOSIS — R296 Repeated falls: Secondary | ICD-10-CM | POA: Diagnosis not present

## 2023-03-08 LAB — CBC WITH DIFFERENTIAL/PLATELET
Absolute Monocytes: 425 cells/uL (ref 200–950)
Basophils Absolute: 20 cells/uL (ref 0–200)
HCT: 36.1 % (ref 35.0–45.0)
Neutro Abs: 2406 cells/uL (ref 1500–7800)
Neutrophils Relative %: 61.7 %

## 2023-03-08 NOTE — Progress Notes (Signed)
Follow Up Assessment:   Diagnoses and all orders for this visit:  Essential hypertension Discussed DASH (Dietary Approaches to Stop Hypertension) DASH diet is lower in sodium than a typical American diet. Cut back on foods that are high in saturated fat, cholesterol, and trans fats. Eat more whole-grain foods, fish, poultry, and nuts Remain active and exercise as tolerated daily.  Monitor BP at home-Call if greater than 130/80.  Check CMP/CBC  Hyperlipidemia, mixed Discussed lifestyle modifications. Recommended diet heavy in fruits and veggies, omega 3's. Decrease consumption of animal meats, cheeses, and dairy products. Remain active and exercise as tolerated. Continue to monitor. Check lipids/TSH  Parkinson disease (HCC) Continue current regimen Follows with neurology, Dr [name] Will do home evaluation for medication and disease management, ADL's, self-care, education, in home help  Vitamin D deficiency Continue supplement. Monitor levels  Abnormal glucose  Education: Reviewed 'ABCs' of diabetes management  Discussed goals to be met and/or maintained include A1C (<7) Blood pressure (<130/80) Cholesterol (LDL <70) Continue Eye Exam yearly  Continue Dental Exam Q6 mo Discussed dietary recommendations Discussed Physical Activity recommendations Foot exam UTD Check A1C   Irritable bowel syndrome with constipation Doing well at this time, avoid triggers  Gastroesophageal reflux disease, unspecified whether esophagitis present No suspected reflux complications (Barret/stricture). Lifestyle modification:  wt loss, avoid meals 2-3h before bedtime. Consider eliminating food triggers:  chocolate, caffeine, EtOH, acid/spicy food.  Status post thoracic spinal fusion Lumbar radiculopathy Age-related osteoporosis with current pathological fracture, sequela Has seen Dr. Lorenso CourierPowers Continue tylenol/muscle relaxer, can use ibuprofen sparingly Lifestyle modification  discussed  Medication management All medications discussed and reviewed in full. All questions and concerns regarding medications addressed.    Positive colorectal cancer screening using Cologuard test Declines further follow up Saw Dr. Madilyn FiremanHayes, no colonoscopy  RBD (REM behavioral disorder) Continue meds  Chronic constipation Continue miralax, increase fiber indiet, INCREASE FLUID - goal 3-4 bottles Increase activity as able Add senokot daily Follow up if not improves or constipation longer than 3 days  High risk falls/weakness Wheelchair per neuro recommendation Will do home evaluation and PT/OT and HH transfer education, in home help If limited benefit with home health or coverage issues will transition to palliative/hospice involvement for additional resources  Orders Placed This Encounter  Procedures   CBC with Differential/Platelet   COMPLETE METABOLIC PANEL WITH GFR   Lipid panel   Hemoglobin A1c   VITAMIN D 25 Hydroxy (Vit-D Deficiency, Fractures)    Notify office for further evaluation and treatment, questions or concerns if any reported s/s fail to improve.   The patient was advised to call back or seek an in-person evaluation if any symptoms worsen or if the condition fails to improve as anticipated.   Further disposition pending results of labs. Discussed med's effects and SE's.    I discussed the assessment and treatment plan with the patient. The patient was provided an opportunity to ask questions and all were answered. The patient agreed with the plan and demonstrated an understanding of the instructions.  Discussed med's effects and SE's. Screening labs and tests as requested with regular follow-up as recommended.  I provided 25 minutes of face-to-face time during this encounter including counseling, chart review, and critical decision making was preformed.  Today's Plan of Care is based on a patient-centered health care approach known as shared decision  making - the decisions, tests and treatments allow for patient preferences and values to be balanced with clinical evidence.     Future Appointments  Date Time Provider Department Center  07/12/2023  2:00 PM [name], MD [facility] None  07/26/2023  1:00 PM [name], [name], DO [facility] None     Plan:   Subjective:  Alexandra Lindsey is a 82 y.o. female who presents for 3 month follow. She has Parkinson disease (HCC); RBD (REM behavioral disorder); Hyperlipidemia; Hypertension; GERD (gastroesophageal reflux disease); Abnormal glucose; Vitamin D deficiency; Medication management; SI (sacroiliac) pain; Chronic pain syndrome; Lumbar radiculopathy; Lumbar stenosis with neurogenic claudication; Positive colorectal cancer screening using Cologuard test; Frequent falls; S/P spinal fusion; Postlaminectomy syndrome, not elsewhere classified; Osteoporosis; Stage 3a chronic kidney disease; and Constipation on their problem list.  She is accompanied by her husband who is her primary caregiver.  Overall she reports feeling well today.   Follows closely with Dr. Arbutus Leas for advancing Parkinson's, on sinemet IR - 2 tablets at 5 AM, 2 tablets at 8 AM, 1 tablet at noon, 1 tablet at 4 PM, 1 tablet at 7 PM, 2 tablets at bedtime.  Off of mirapex due to hallucinations, but states still having some, Dr. Arbutus Leas is aware. She continues to see AA females sitting on her home scooter.  She is very unstable r/t Parkinson's with high fall risk, numerous falls with injury, has transitioned from walker to Wheelchair per neuro recommendations. Functional incontinence, wears depends. Husband is primary caregiver, has 24/7 needs, does pay out of pocket for in home help twice a week.  Had [facility] but was discharged 10/25/22 for meeting goal of minimum assist with transfer/ambulation.   She is currently prescribed sertraline 100 mg daily for mood, she does feel this helps, doing fairly.   She is seeing Dr Lorenso Courier related to multiple  fractures and she is s/p thoracic spinal fusion. She reports chronic pain following, some good/bad days, but sitting in chair 20 min will cause severe pain, lying down helps. Taking tylenol 1000 mg TID, baclofen with variable benefit.   She had a + cologuard in 2018, saw Dr. Madilyn Fireman who recommended monitoring only, declines further colonoscopy, no blood in stool. However she does report constipation ongoing, has been taking miralax, fiber supplement. Admits to very little activity, very poor water intake.   Patient unable to stand.  She has not been working on diet and exercise. Wt Readings from Last 3 Encounters:  03/08/23 143 lb (64.9 kg)  01/24/23 143 lb (64.9 kg)  01/18/23 143 lb (64.9 kg)   Her blood pressure has been controlled at home, today their BP is BP: 112/70 She does not workout. She denies chest pain, shortness of breath, dizziness.   She is not on cholesterol medication and denies myalgias. Her cholesterol is not at goal. The cholesterol last visit was:   Lab Results  Component Value Date   CHOL 250 (H) 11/16/2022   HDL 47 (L) 11/16/2022   LDLCALC 157 (H) 11/16/2022   TRIG 298 (H) 11/16/2022   CHOLHDL 5.3 (H) 11/16/2022   :  Lab Results  Component Value Date   HGBA1C 5.7 (H) 11/16/2022   She has CKD III monitored at this office. She Last GFR Lab Results  Component Value Date   GFRNONAA 52 (L) 05/02/2021   GFRNONAA 53 (L) 01/18/2021   GFRNONAA 63 07/20/2020    Patient is on Vitamin D supplement.   Lab Results  Component Value Date   VD25OH 78 11/16/2022       Medication Review: Current Outpatient Medications on File Prior to Visit  Medication Sig Dispense Refill  acetaminophen (TYLENOL) 500 MG tablet Take 1,000 mg by mouth 3 (three) times daily.     albuterol (PROVENTIL HFA;VENTOLIN HFA) 108 (90 Base) MCG/ACT inhaler Inhale 1-2 puffs into the lungs every 6 (six) hours as needed for wheezing or shortness of breath.     carbidopa-levodopa (SINEMET IR) 25-100  MG tablet TAKE 2 TABS AT 5AM, 1 TAB AT 7AM, 2 TABS AT 9AM, 1 TAB AT 11AM, 2 TABS AT 1PM, 1 TAB AT 3PM, 1 TAB AT 5PM, AND 1 TAB AT BEDTIME 990 tablet 0   cefUROXime (CEFTIN) 250 MG tablet Take 1 tablet 2 x /day with food for UTI 14 tablet 0   Cholecalciferol (VITAMIN D3) 5000 units CAPS Take 5,000 Units by mouth daily.     diclofenac Sodium (VOLTAREN) 1 % GEL Apply 4 g topically 4 (four) times daily. 350 g 1   dicyclomine (BENTYL) 20 MG tablet Take  1/2 to 1 tablet   3 x /day  bedfore Meals for abdominal Cramping, Bloating or Diarrhea (Patient taking differently: as needed. Take  1/2 to 1 tablet   3 x /day  bedfore Meals for abdominal Cramping, Bloating or Diarrhea) 90 tablet 0   hydrochlorothiazide (HYDRODIURIL) 25 MG tablet Take      1 tablet     Daily      For Fluid Retention / Ankle Swelling 90 tablet 1   methocarbamol (ROBAXIN) 500 MG tablet Take 500 mg by mouth as needed.     midodrine (PROAMATINE) 10 MG tablet Take 1/2-1 tab thee times daily for low blood pressure. 180 tablet 1   NON FORMULARY Arthritis cream     nystatin (MYCOSTATIN/NYSTOP) powder Apply under breasts 2-3 times a day. 60 g 2   ondansetron (ZOFRAN-ODT) 4 MG disintegrating tablet Take 4 mg by mouth every 8 (eight) hours as needed for nausea or vomiting.     OVER THE COUNTER MEDICATION Takes Mirlax daily     Pimavanserin Tartrate (NUPLAZID) 34 MG CAPS Take 1 capsule (34 mg total) by mouth daily at 12 noon. 28 capsule 0   Pimavanserin Tartrate (NUPLAZID) 34 MG CAPS Samples of this drug were given to the patient, quantity 4, Lot Number [id number] exp 06/2024 30 capsule 0   Probiotic Product (ALIGN) 4 MG CAPS Take 1 capsule by mouth daily.     rosuvastatin (CRESTOR) 20 MG tablet Take 1 tablet Daily for Cholesterol 90 tablet 3   sertraline (ZOLOFT) 100 MG tablet Take  1&1/2 tablets (150 mg)  at Bedtime for Mood                                   /                                       TAKE                   BY                       MOUTH  405 tablet 3   vitamin B-12 (CYANOCOBALAMIN) 500 MCG tablet Take 500 mcg by mouth daily.     No current facility-administered medications on file prior to visit.    Allergies: Allergies  Allergen Reactions   Lyrica [Pregabalin] Rash and Other (See Comments)    Weight  gain and swelling in hands, legs and feet   Macrobid [Nitrofurantoin Macrocrystal] Swelling and Rash   Baclofen Other (See Comments)    Kept her awake, affected memory   Cephalosporins Other (See Comments)    Cannot sleep   Codeine Nausea Only    dizziness   Darvocet [Propoxyphene N-Acetaminophen] Nausea And Vomiting   Diclofenac Rash    The gel caused a rash   Levaquin [Levofloxacin] Hives   Penicillins Rash   Selegiline Hcl Rash   Tramadol Nausea And Vomiting   Triamcinolone Itching    Current Problems (verified) has Parkinson disease (HCC); RBD (REM behavioral disorder); Hyperlipidemia; Hypertension; GERD (gastroesophageal reflux disease); Abnormal glucose; Vitamin D deficiency; Medication management; SI (sacroiliac) pain; Chronic pain syndrome; Lumbar radiculopathy; Lumbar stenosis with neurogenic claudication; Positive colorectal cancer screening using Cologuard test; Frequent falls; S/P spinal fusion; Postlaminectomy syndrome, not elsewhere classified; Osteoporosis; Stage 3a chronic kidney disease; and Constipation on their problem list.  Screening Tests Immunization History  Administered Date(s) Administered   Influenza, High Dose Seasonal PF 09/08/2014, 10/04/2015, 09/02/2019, 08/30/2020, 09/26/2021, 10/12/2022   Influenza-Unspecified 09/29/2016, 08/08/2017, 08/22/2018   PFIZER(Purple Top)SARS-COV-2 Vaccination 12/22/2019, 01/23/2020, 09/28/2020   Pneumococcal Conjugate-13 11/10/2015   Pneumococcal Polysaccharide-23 02/26/2017   Pneumococcal-Unspecified 11/27/2006   Td 09/08/2013   Zoster, Live 06/27/2006    Patient Care Team: [name], MD as PCP - General (Internal Medicine) Dorena Cookey, MD  (Inactive) as Consulting Physician (Gastroenterology) Lennette Bihari, MD as Consulting Physician (Cardiology) Judi Saa, DO as Attending Physician (Family Medicine) [name], [name], DO as Consulting Physician (Neurology) Eldred Manges, MD as Consulting Physician (Orthopedic Surgery) Diamantina Providence, MD as Referring Physician (Neurosurgery) Powers, Johnnye Lana, MD as Referring Physician (Oral Surgery) Lajoyce Corners, [facility] (Inactive) as Pharmacist (Pharmacist)  Surgical: She  has a past surgical history that includes Abdominal hysterectomy; Hernia repair; Rotator cuff repair (Left, 10-07-12); Bunionectomy (Bilateral, 11-11-3); Cardiac catheterization (Bilateral, 2013); left heart catheterization with coronary angiogram (N/A, 07/03/2012); Appendectomy; Colonoscopy; Back surgery (12/20/2015); Repair of cerebrospinal fluid leak (N/A, 12/24/2015); Back surgery (09/27/2017); and Back surgery (2020). Family Her family history includes CVA in her brother; Esophageal cancer in her mother; Heart attack in her mother; Hypercholesterolemia in her brother; Hypertension in her brother; Mental retardation in her brother; Suicidality in her brother and father. Social history  She reports that she has never smoked. She has never used smokeless tobacco. She reports that she does not drink alcohol and does not use drugs.   Objective:   Today's Vitals   03/08/23 1436  BP: 112/70  Pulse: 80  Temp: 97.6 F (36.4 C)  SpO2: 99%  Weight: 143 lb (64.9 kg)  Height: 5' (1.524 m)     General appearance: Wheelchair, alert, no distress, WD/WN, female HEENT: normocephalic, sclerae anicteric, TMs pearly, nares patent, no discharge or erythema, pharynx normal, Oral cavity: MMM, no lesions Neck: supple, no lymphadenopathy, no thyromegaly, no masses Heart: RRR, normal S1, S2, no murmurs Lungs: CTA bilaterally, no wheezes, rhonchi, or rales Abdomen: +bs, rounded, soft, non tender, no palpable masses, no  hepatomegaly, no splenomegaly Musculoskeletal: Obvious bony enlargement at MCP, DIP, PIP joints without erythema, heat, effusion, with mild ulnar deviation deformity bilaterally; Scoliosis, hunched over in wheelchair.  Skin: Free of any rashes, lesions, or ecchymosis.   Extremities: no edema, no cyanosis, no clubbing Pulses: 2+ symmetric, upper and lower extremities, normal cap refill Neurological: alert, oriented x 3, CN2-12 intact, strength decreased upper extremities and lower extremities, sensation normal throughout, has baseline  tremor, mild mask facies. In wheelchair, gait not assessed.   Psychiatric: normal affect, behavior normal, pleasant    [name], NP

## 2023-03-09 LAB — COMPLETE METABOLIC PANEL WITH GFR
AG Ratio: 2.2 (calc) (ref 1.0–2.5)
ALT: <3 U/L — ABNORMAL LOW (ref 6–29)
AST: 8 U/L — ABNORMAL LOW (ref 10–35)
Albumin: 4.2 g/dL (ref 3.6–5.1)
Alkaline phosphatase (APISO): 86 U/L (ref 37–153)
BUN/Creatinine Ratio: 33 (calc) — ABNORMAL HIGH (ref 6–22)
BUN: 29 mg/dL — ABNORMAL HIGH (ref 7–25)
CO2: 26 mmol/L (ref 20–32)
Calcium: 9.6 mg/dL (ref 8.6–10.4)
Chloride: 106 mmol/L (ref 98–110)
Creat: 0.89 mg/dL (ref 0.60–0.95)
Globulin: 1.9 g/dL (calc) (ref 1.9–3.7)
Glucose, Bld: 105 mg/dL — ABNORMAL HIGH (ref 65–99)
Potassium: 4.4 mmol/L (ref 3.5–5.3)
Sodium: 140 mmol/L (ref 135–146)
Total Bilirubin: 0.3 mg/dL (ref 0.2–1.2)
Total Protein: 6.1 g/dL (ref 6.1–8.1)
eGFR: 65 mL/min/{1.73_m2} (ref >=60)

## 2023-03-09 LAB — CBC WITH DIFFERENTIAL/PLATELET
Basophils Relative: 0.5 %
Eosinophils Absolute: 90 cells/uL (ref 15–500)
Eosinophils Relative: 2.3 %
Hemoglobin: 11.8 g/dL (ref 11.7–15.5)
Lymphs Abs: 959 cells/uL (ref 850–3900)
MCH: 30.7 pg (ref 27.0–33.0)
MCHC: 32.7 g/dL (ref 32.0–36.0)
MCV: 94 fL (ref 80.0–100.0)
MPV: 11.1 fL (ref 7.5–12.5)
Monocytes Relative: 10.9 %
Platelets: 239 10*3/uL (ref 140–400)
RBC: 3.84 10*6/uL (ref 3.80–5.10)
RDW: 12.4 % (ref 11.0–15.0)
Total Lymphocyte: 24.6 %
WBC: 3.9 10*3/uL (ref 3.8–10.8)

## 2023-03-09 LAB — VITAMIN D 25 HYDROXY (VIT D DEFICIENCY, FRACTURES): Vit D, 25-Hydroxy: 103 ng/mL — ABNORMAL HIGH (ref 30–100)

## 2023-03-09 LAB — HEMOGLOBIN A1C
Hgb A1c MFr Bld: 5.7 % of total Hgb — ABNORMAL HIGH (ref <5.7)
Mean Plasma Glucose: 117 mg/dL
eAG (mmol/L): 6.5 mmol/L

## 2023-03-09 LAB — LIPID PANEL
Cholesterol: 216 mg/dL — ABNORMAL HIGH (ref <200)
HDL: 45 mg/dL — ABNORMAL LOW (ref >=50)
LDL Cholesterol (Calc): 127 mg/dL (calc) — ABNORMAL HIGH
Non-HDL Cholesterol (Calc): 171 mg/dL (calc) — ABNORMAL HIGH (ref <130)
Total CHOL/HDL Ratio: 4.8 (calc) (ref <5.0)
Triglycerides: 287 mg/dL — ABNORMAL HIGH (ref <150)

## 2023-03-18 NOTE — Patient Instructions (Signed)

## 2023-03-19 ENCOUNTER — Other Ambulatory Visit: Payer: Self-pay | Admitting: Neurology

## 2023-03-19 DIAGNOSIS — G20A1 Parkinson's disease without dyskinesia, without mention of fluctuations: Secondary | ICD-10-CM

## 2023-04-12 ENCOUNTER — Telehealth: Payer: Self-pay | Admitting: Neurology

## 2023-04-12 NOTE — Telephone Encounter (Signed)
I spoke to patient she feels Nupalzid is making her sleep over 10 hours and that's too much the patient feels. She also thinks the sertraline she is on is causing her to hallucinate. Dr. Nicola Police prescribes that so I have asked her to call his office regarding that medication

## 2023-04-12 NOTE — Telephone Encounter (Signed)
Left message with the after hour service on 04-12-23 at 12:24 pm   Caller states that she would like to speak to Alexandra Lindsey. She spoke with a nurse yesterday and has a meeting on 05-16-23

## 2023-04-13 ENCOUNTER — Other Ambulatory Visit: Payer: Self-pay

## 2023-04-13 NOTE — Telephone Encounter (Signed)
Called patient and she will stop the Nuplazid at this time she also is not coming to the event in June and wants to be credited her money back to her account I have no idea how to change that

## 2023-05-08 ENCOUNTER — Encounter: Payer: Medicare Other | Admitting: Internal Medicine

## 2023-06-14 ENCOUNTER — Other Ambulatory Visit: Payer: Self-pay | Admitting: Neurology

## 2023-06-14 DIAGNOSIS — G20A1 Parkinson's disease without dyskinesia, without mention of fluctuations: Secondary | ICD-10-CM

## 2023-07-04 ENCOUNTER — Other Ambulatory Visit: Payer: Self-pay | Admitting: Internal Medicine

## 2023-07-04 DIAGNOSIS — F32A Depression, unspecified: Secondary | ICD-10-CM

## 2023-07-11 NOTE — Progress Notes (Signed)
Comprehensive Evaluation &  Examination   Future Appointments  Date Time Provider Department  07/12/2023  2:00 PM [name], MD [facility]  07/26/2023  1:00 PM [name], [name], DO [facility]  07/21/2024  2:00 PM [name], MD [facility]        This very nice 82 y.o. MWF  presents for a comprehensive evaluation and management of multiple medical co-morbidities.  Patient has been followed for HTN and neurogenic Orthostatic Hypotension, HLD, Pre-Diabetes, Parkinson's Disease and Vitamin D Deficiency.                      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  Patient has had improvement in her LBP on Lyrica.  Patient has advanced Parkinson's Dz (2002)  & is followed closely by Dr [name].  Patient  has an  unstable gait & has hx/o multiple falls and has been advised not to attempt ambulation.  Dr [name] attempts to manage patient's Parkinson's meds, but apparently patient is very poorly compliant.           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  Patient's BP has been controlled at home and  is 108/65 sitting today.   Patient denies any cardiac symptoms as chest pain, palpitations, shortness of breath  or ankle swelling.  In 2013, she had a Negative Heart Cath. Today's BP is at goal - 112/72.         Patient's hyperlipidemia is not controlled with diet and medications. Patient denies myalgias or other medication SE's. Last lipids were not at goal with  elevated Trig's :   Lab Results  Component Value Date   CHOL 216 (H) 03/08/2023   HDL 45 (L) 03/08/2023   LDLCALC 127 (H) 03/08/2023   TRIG 287 (H) 03/08/2023   CHOLHDL 4.8 03/08/2023         Patient has hx/o PreDiabetes  (A1c 6.3% /2011 and 5.9%/2013) & is monitored for glucose intolerance and patient denies reactive hypoglycemic symptoms, visual blurring, diabetic polys or paresthesias. Last A1c was near   goal :  Lab Results  Component Value Date   HGBA1C 5.7 (H) 03/08/2023         Finally, patient has history of Vitamin D Deficiency ("23" /2008) and last Vitamin D was at goal :  Lab Results  Component Value Date   VD25OH 103 (H) 03/08/2023       Current Outpatient Medications  Medication Instructions  . acetaminophen    1,000 mg 3 times daily  . albuterol  HFA   inhaler 1-2 puffs, Inhalation, Every 6 hours PRN  . SINEMET IR 25-100 MG tablet 2TABS AT 5AM 1TAB AT 7AM 2TABS AT 9AM 1TAB AT 11AM 2TABS AT 1PM 1TAB AT 3PM 1 TAB AT 5PM 1TAB AT BEDTIME  . diclofenac Sodium (VOLTAREN) 4 g, Topical, 4 times daily  . dicyclomine (BENTYL) 20 MG tablet Take  1/2 to 1 tablet   3 x /day  bedfore Meals  . hydrochlorothiazide (HYDRODIURIL) 25 MG tablet Take      1 tablet     Daily      \\  . methocarbamol (ROBAXIN)   500 mg  As needed  . midodrine (PROAMATINE) 10 MG tablet Take 1/2-1 tab thee times daily   . NON FORMULARY Arthritis cream   . nystatin (MYCOSTATIN/NYSTOP) powder  Apply under breasts 2-3 times a day.  . ondansetron (ZOFRAN-ODT) 4 mg, Oral, Every 8 hours PRN  . OVER THE COUNTER MEDICATION Takes Mirlax daily   . Probiotic Product (ALIGN) 4 MG CAPS 1 capsule, Oral, Daily  . rosuvastatin (CRESTOR) 20 MG tablet Take 1 tablet Daily   . sertraline (ZOLOFT) 100 MG tablet TAKE 1 AND 1/2 TABLETS  AT NIGHT  . vitamin B-12   500 mcg,  Daily  . Vitamin D     5,000 Units Daily     Allergies  Allergen Reactions  . Lyrica [Pregabalin] Rash and Other (See Comments)    Weight gain and swelling in hands, legs and feet  . Macrobid [Nitrofurantoin Macrocrystal] Swelling and Rash  . Baclofen Other (See Comments)    Kept her awake, affected memory  . Cephalosporins Other (See Comments)    Cannot sleep  . Codeine Nausea Only    dizziness  . Darvocet [Propoxyphene N-Acetaminophen] Nausea And Vomiting  . Diclofenac Rash    The gel caused a rash  . Levaquin [Levofloxacin] Hives  . Penicillins  Rash  . Selegiline Hcl Rash  . Tramadol Nausea And Vomiting  . Triamcinolone Itching     Past Medical History:  Diagnosis Date  . Anemia    as young girl and teenager  . Arthritis   . Chest pain   . Constipation   . Costochondritis   . GERD    . Headache    ocular migraines  . History of hiatal hernia   . Hyperlipidemia 10-07-12  . Never smoked tobacco   . Parkinson's disease (HCC) 10-07-12  . Prediabetes   . 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 Spinal stenosis      Health Maintenance  Topic Date Due  . Pneumococcal Vaccine 71-23 Years old (1 of 4 - PCV13) Never done  . Hepatitis C Screening  Never done  . Zoster Vaccines- Shingrix (1 of 2) Never done  . COVID-19 Vaccine (3 - Booster for Pfizer series) 06/21/2020  . INFLUENZA VACCINE  06/27/2021  . TETANUS/TDAP  09/09/2023  . DEXA SCAN  Completed  . PNA vac Low Risk Adult  Completed  . HPV VACCINES  Aged Out     Immunization History  Administered Date(s) Administered  . Influenza, High Dose   09/08/2014, 10/04/2015, 09/02/2019, 08/30/2020  . Influenza  09/29/2016, 08/08/2017, 08/22/2018  . PFIZER SARS-COV-2 Vacc 12/22/2019, 01/23/2020  . Pneumococcal  - 13 11/10/2015  . Pneumococcal  - 23 02/26/2017  . Pneumococcal  - 23 11/27/2006  . Td 09/08/2013  . Zoster, Live 06/27/2006    Colon  - 06/28/1993 - Dr Dorena Cookey  - Rectal Polyp villous adenoma Colon   - 02/2014 - Diverticulosis  Cologard - 03/26/2017 - Positive - referred back to Dr Madilyn Fireman  -  11/11/2019 - Seen consult by Dr Charlott Rakes & recommended a colonoscopy    EGD - 05/02/2010 -  Dr Dorena Cookey    Last MGM - 10/09/2013    Past Surgical History:  Procedure Laterality Date  . ABDOMINAL HYSTERECTOMY    . APPENDECTOMY    . BACK SURGERY  12/20/2015  . BACK SURGERY  09/27/2017  . BACK SURGERY  2020  . BUNIONECTOMY Bilateral 11-11-3  . CARDIAC CATHETERIZATION Bilateral 2013  . COLONOSCOPY    . HERNIA REPAIR    . LEFT HEART  CATHETERIZATION WITH CORONARY ANGIOGRAM N/A 07/03/2012   Procedure: LEFT HEART CATHETERIZATION WITH CORONARY ANGIOGRAM;  Surgeon: Lennette Bihari,  MD;  Location: [facility];  Service: Cardiovascular;  Laterality: N/A;  . REPAIR OF CEREBROSPINAL FLUID LEAK N/A 12/24/2015   Procedure: LUMBAR WOUND EXPLORATION, REPAIR OF CEREBROSPINAL FLUID LEAK, PLACEMENT OF LUMBAR DRAIN;  Surgeon: Loura Halt Ditty, MD;  Location: [facility];  Service: Neurosurgery;  Laterality: N/A;  . ROTATOR CUFF REPAIR Left 10-07-12     Family History  Problem Relation Age of Onset  . Heart attack Mother   . Esophageal cancer Mother   . Suicidality Father        suicide   . Suicidality Brother        suicide   . CVA Brother   . Hypertension Brother   . Hypercholesterolemia Brother   . Mental retardation Brother      Social History   Tobacco Use  . Smoking status: Never Smoker  . Smokeless tobacco: Never Used  Vaping Use  . Vaping Use: Never used  Substance Use Topics  . Alcohol use: No    Alcohol/week: 0.0 standard drinks  . Drug use: No      ROS Constitutional: Denies fever, chills, weight loss/gain, headaches, insomnia,  night sweats, and change in appetite. Does c/o fatigue. Eyes: Denies redness, blurred vision, diplopia, discharge, itchy, watery eyes.  ENT: Denies discharge, congestion, post nasal drip, epistaxis, sore throat, earache, hearing loss, dental pain, Tinnitus, Vertigo, Sinus pain, snoring.  Cardio: Denies chest pain, palpitations, irregular heartbeat, syncope, dyspnea, diaphoresis, orthopnea, PND, claudication, edema Respiratory: denies cough, dyspnea, DOE, pleurisy, hoarseness, laryngitis, wheezing.  Gastrointestinal: Denies dysphagia, heartburn, reflux, water brash, pain, cramps, nausea, vomiting, bloating, diarrhea, constipation, hematemesis, melena, hematochezia, jaundice, hemorrhoids Genitourinary: Denies dysuria, frequency, urgency, nocturia, hesitancy, discharge, hematuria,  flank pain Breast: Breast lumps, nipple discharge, bleeding.  Musculoskeletal: Denies arthralgia, myalgia, stiffness, Jt. Swelling, pain, limp, and strain/sprain. Denies falls. Skin: Denies puritis, rash, hives, warts, acne, eczema, changing in skin lesion Neuro: No weakness, tremor, incoordination, spasms, paresthesia, pain Psychiatric: Denies confusion, memory loss, sensory loss. Denies Depression. Endocrine: Denies change in weight, skin, hair change, nocturia, and paresthesia, diabetic polys, visual blurring, hyper / hypo glycemic episodes.  Heme/Lymph: No excessive bleeding, bruising, enlarged lymph nodes.  Physical Exam  There were no vitals taken for this visit.  General Appearance: Well nourished, well groomed and in no apparent distress.  Eyes: PERRLA, EOMs, conjunctiva no swelling or erythema, normal fundi and vessels. Sinuses: No frontal/maxillary tenderness ENT/Mouth: EACs patent / TMs  nl. Nares clear without erythema, swelling, mucoid exudates. Oral hygiene is good. No erythema, swelling, or exudate. Tongue normal, non-obstructing. Tonsils not swollen or erythematous. Hearing normal.  Neck: Supple, thyroid not palpable. No bruits, nodes or JVD. Respiratory: Respiratory effort normal.  BS equal and clear bilateral without rales, rhonci, wheezing or stridor. Cardio: Heart sounds are normal with regular rate and rhythm and no murmurs, rubs or gallops. Peripheral pulses are normal and equal bilaterally without edema. No aortic or femoral bruits. Chest: symmetric with normal excursions and percussion. Breasts: deferred to upcoming appt with GYN/ Dr Ambrose Mantle by patient request. Abdomen: protuberant, soft with bowel sounds active. Nontender, no guarding, rebound, hernias, masses or organomegaly.  Lymphatics: Non tender without lymphadenopathy.  Musculoskeletal:  FROM w/o deformity.  Nl muscle tone . Mild  cog-wheeling Lt>RUE. Lateral splaying of  Fingers / MCP jts - Lt>>Rt with  Heberden's /Bouchard's  nodes.  Patient is in a wheelchair.  Skin: Warm and dry without rashes, lesions, cyanosis, clubbing or  ecchymosis.  Neuro: Masked facies.Cranial nerves intact, reflexes  equal bilaterally. Normal muscle tone, no cerebellar symptoms. Sensation intact.  Pysch: Alert and oriented x 3, normal affect, Insight and Judgment appropriate.    Assessment and Plan  1. Labile hypertension  - EKG 12-Lead - Urinalysis, Routine w reflex microscopic - CBC with Differential/Platelet - COMPLETE METABOLIC PANEL WITH GFR - Magnesium - TSH  2. Idiopathic hypotension  - EKG 12-Lead  3. Hyperlipidemia, mixed  - EKG 12-Lead - Lipid panel - TSH  4. Abnormal glucose  - EKG 12-Lead - Hemoglobin A1c - Insulin, random  5. Vitamin D deficiency  - VITAMIN D 25 Hydroxy   6. Parkinson disease (HCC)   7. Unstable gait   8. At high risk for falls   9. Chronic pain syndrome   10. Age-related osteoporosis   - COMPLETE METABOLIC PANEL WITH GFR  11. Screening for ischemic heart disease  - EKG 12-Lead  12. FHx: heart disease  - EKG 12-Lead  13. Medication management  - Urinalysis, Routine w reflex microscopic - Microalbumin / creatinine urine ratio - CBC with Differential/Platelet - COMPLETE METABOLIC PANEL WITH GFR - Magnesium - Lipid panel - TSH - Hemoglobin A1c - Insulin, random - VITAMIN D 25 Hydroxy   14. Screening for colorectal cancer  - POC Hemoccult Bld/Stl          Patient was counseled in prudent diet to achieve/maintain BMI less than 25 for weight control, BP monitoring, regular exercise and medications. Discussed med's effects and SE's. Screening labs and tests as requested with regular follow-up as recommended. Over 40 minutes of exam, counseling, chart review and high complex critical decision making was performed.   Marinus Maw, MD

## 2023-07-12 ENCOUNTER — Encounter: Payer: Self-pay | Admitting: Internal Medicine

## 2023-07-12 ENCOUNTER — Ambulatory Visit (INDEPENDENT_AMBULATORY_CARE_PROVIDER_SITE_OTHER): Payer: Medicare Other | Admitting: Internal Medicine

## 2023-07-12 VITALS — BP 122/68 | HR 82 | Temp 97.5°F | Resp 16 | Ht 60.0 in

## 2023-07-12 DIAGNOSIS — R7309 Other abnormal glucose: Secondary | ICD-10-CM | POA: Diagnosis not present

## 2023-07-12 DIAGNOSIS — I950 Idiopathic hypotension: Secondary | ICD-10-CM

## 2023-07-12 DIAGNOSIS — Z136 Encounter for screening for cardiovascular disorders: Secondary | ICD-10-CM | POA: Diagnosis not present

## 2023-07-12 DIAGNOSIS — E559 Vitamin D deficiency, unspecified: Secondary | ICD-10-CM | POA: Diagnosis not present

## 2023-07-12 DIAGNOSIS — E782 Mixed hyperlipidemia: Secondary | ICD-10-CM

## 2023-07-12 DIAGNOSIS — Z79899 Other long term (current) drug therapy: Secondary | ICD-10-CM | POA: Diagnosis not present

## 2023-07-12 DIAGNOSIS — G20A1 Parkinson's disease without dyskinesia, without mention of fluctuations: Secondary | ICD-10-CM

## 2023-07-12 DIAGNOSIS — M8000XS Age-related osteoporosis with current pathological fracture, unspecified site, sequela: Secondary | ICD-10-CM

## 2023-07-12 DIAGNOSIS — R2681 Unsteadiness on feet: Secondary | ICD-10-CM | POA: Diagnosis not present

## 2023-07-12 DIAGNOSIS — R0989 Other specified symptoms and signs involving the circulatory and respiratory systems: Secondary | ICD-10-CM

## 2023-07-12 DIAGNOSIS — Z9181 History of falling: Secondary | ICD-10-CM

## 2023-07-12 DIAGNOSIS — Z1211 Encounter for screening for malignant neoplasm of colon: Secondary | ICD-10-CM

## 2023-07-12 NOTE — Patient Instructions (Signed)

## 2023-07-13 ENCOUNTER — Other Ambulatory Visit: Payer: Self-pay | Admitting: Internal Medicine

## 2023-07-13 DIAGNOSIS — E782 Mixed hyperlipidemia: Secondary | ICD-10-CM

## 2023-07-13 LAB — URINALYSIS, ROUTINE W REFLEX MICROSCOPIC
Bacteria, UA: NONE SEEN /HPF
Bilirubin Urine: NEGATIVE
Glucose, UA: NEGATIVE
Hgb urine dipstick: NEGATIVE
Hyaline Cast: NONE SEEN /LPF
Nitrite: NEGATIVE
Specific Gravity, Urine: 1.035 (ref 1.001–1.035)
Squamous Epithelial / HPF: NONE SEEN /HPF (ref <=5)
pH: <=5 (ref 5.0–8.0)

## 2023-07-13 LAB — CBC WITH DIFFERENTIAL/PLATELET
Absolute Monocytes: 392 {cells}/uL (ref 200–950)
Basophils Absolute: 20 {cells}/uL (ref 0–200)
Basophils Relative: 0.4 %
Eosinophils Absolute: 103 {cells}/uL (ref 15–500)
Eosinophils Relative: 2.1 %
HCT: 34 % — ABNORMAL LOW (ref 35.0–45.0)
Hemoglobin: 11.2 g/dL — ABNORMAL LOW (ref 11.7–15.5)
Lymphs Abs: 931 {cells}/uL (ref 850–3900)
MCH: 31.2 pg (ref 27.0–33.0)
MCHC: 32.9 g/dL (ref 32.0–36.0)
MCV: 94.7 fL (ref 80.0–100.0)
MPV: 11.2 fL (ref 7.5–12.5)
Monocytes Relative: 8 %
Neutro Abs: 3455 {cells}/uL (ref 1500–7800)
Neutrophils Relative %: 70.5 %
Platelets: 227 10*3/uL (ref 140–400)
RBC: 3.59 10*6/uL — ABNORMAL LOW (ref 3.80–5.10)
RDW: 12.2 % (ref 11.0–15.0)
Total Lymphocyte: 19 %
WBC: 4.9 10*3/uL (ref 3.8–10.8)

## 2023-07-13 LAB — MAGNESIUM: Magnesium: 2.2 mg/dL (ref 1.5–2.5)

## 2023-07-13 LAB — MICROALBUMIN / CREATININE URINE RATIO
Creatinine, Urine: 243 mg/dL (ref 20–275)
Microalb Creat Ratio: 16 mg/g{creat} (ref <30)
Microalb, Ur: 3.9 mg/dL

## 2023-07-13 LAB — COMPLETE METABOLIC PANEL WITH GFR
AG Ratio: 2 (calc) (ref 1.0–2.5)
ALT: <3 U/L — ABNORMAL LOW (ref 6–29)
AST: 9 U/L — ABNORMAL LOW (ref 10–35)
Albumin: 4.3 g/dL (ref 3.6–5.1)
Alkaline phosphatase (APISO): 91 U/L (ref 37–153)
BUN/Creatinine Ratio: 29 (calc) — ABNORMAL HIGH (ref 6–22)
BUN: 29 mg/dL — ABNORMAL HIGH (ref 7–25)
CO2: 26 mmol/L (ref 20–32)
Calcium: 9.9 mg/dL (ref 8.6–10.4)
Chloride: 109 mmol/L (ref 98–110)
Creat: 0.99 mg/dL — ABNORMAL HIGH (ref 0.60–0.95)
Globulin: 2.1 g/dL (ref 1.9–3.7)
Glucose, Bld: 94 mg/dL (ref 65–99)
Potassium: 4.4 mmol/L (ref 3.5–5.3)
Sodium: 142 mmol/L (ref 135–146)
Total Bilirubin: 0.3 mg/dL (ref 0.2–1.2)
Total Protein: 6.4 g/dL (ref 6.1–8.1)
eGFR: 57 mL/min/{1.73_m2} — ABNORMAL LOW (ref >=60)

## 2023-07-13 LAB — HEMOGLOBIN A1C
Hgb A1c MFr Bld: 5.5 %{Hb} (ref <5.7)
Mean Plasma Glucose: 111 mg/dL
eAG (mmol/L): 6.2 mmol/L

## 2023-07-13 LAB — TSH: TSH: 0.87 m[IU]/L (ref 0.40–4.50)

## 2023-07-13 LAB — INSULIN, RANDOM: Insulin: 10.2 u[IU]/mL

## 2023-07-13 LAB — LIPID PANEL
Cholesterol: 213 mg/dL — ABNORMAL HIGH (ref <200)
HDL: 46 mg/dL — ABNORMAL LOW (ref >=50)
LDL Cholesterol (Calc): 132 mg/dL — ABNORMAL HIGH
Non-HDL Cholesterol (Calc): 167 mg/dL — ABNORMAL HIGH (ref <130)
Total CHOL/HDL Ratio: 4.6 (calc) (ref <5.0)
Triglycerides: 213 mg/dL — ABNORMAL HIGH (ref <150)

## 2023-07-13 LAB — VITAMIN D 25 HYDROXY (VIT D DEFICIENCY, FRACTURES): Vit D, 25-Hydroxy: 90 ng/mL (ref 30–100)

## 2023-07-13 MED ORDER — ROSUVASTATIN CALCIUM 20 MG PO TABS
ORAL_TABLET | ORAL | 3 refills | Status: DC
Start: 2023-07-13 — End: 2024-02-21

## 2023-07-13 NOTE — Progress Notes (Signed)
^<^<^<^<^<^<^<^<^<^<^<^<^<^<^<^<^<^<^<^<^<^<^<^<^<^<^<^<^<^<^<^<^<^<^<^<^ ^>^>^>^>^>^>^>^>^>^>^>>^>^>^>^>^>^>^>^>^>^>^>^>^>^>^>^>^>^>^>^>^>^>^>^>^>  -  Kidney functions still Stage 3a  & Stable / OK   ^<^<^<^<^<^<^<^<^<^<^<^<^<^<^<^<^<^<^<^<^<^<^<^<^<^<^<^<^<^<^<^<^<^<^<^<^ ^>^>^>^>^>^>^>^>^>^>^>^>^>^>^>^>^>^>^>^>^>^>^>^>^>^>^>^>^>^>^>^>^>^>^>^>^   - Total Chol = 213     is h igh risk for Heart Attack /Stroke /Vascular Dementia     ( Ideal or Goal is less than 180 ! )  & - Bad /Dangerous LDL Chol = 132  - - >> Sitting on a time Bomb !     ( Ideal or Goal is less than 70 ! )   - Please Re-Start your Rosuvastatin / Crestor                              ( New Rx sent in to your [facility] )    - Diet is still very Important   .-.-.-.-.-.-.-.-.-.-.-.-.-.-.-.-.-.-.-.-.-.-.-.-.-.-.-.-.-.-.-.-.-.-.-.-.-.-.-.-.-.-.-.-.-.-.-.-.-.-.-.-.-.-.-.-.-.-.-.-.-.-.-.-  - Recommend a stricter plant based low cholesterol diet   - Cholesterol only comes from animal sources                                                                  - ie. meat, dairy, egg yolks  - Eat all the vegetables you want.                                   - Avoid Meat, Avoid Meat , Avoid Meat  ! ! !                                                                             - especially red meat - Beef AND Pork  - Avoid cheese & dairy - milk & ice cream.   - Cheese is the most concentrated form of trans-fats which   ^<^<^<^<^<^<^<^<^<^<^<^<^<^<^<^<^<^<^<^<^<^<^<^<^<^<^<^<^<^<^<^<^<^<^<^<^ ^>^>^>^>^>^>^>^>^>^>^>^>^>^>^>^>^>^>^>^>^>^>^>^>^>^>^>^>^>^>^>^>^>^>^>^>^  -    Also Triglycerides (  = 213    ) or fats in blood are too high                 (   Ideal or  Goal is less than 150  !  )    - Recommend avoid fried & greasy foods,  sweets / candy,   - Avoid white rice  (brown or wild rice or Quinoa is OK),   - Avoid white potatoes  (sweet potatoes are OK)   - Avoid anything made from white  flour  - bagels, doughnuts, rolls, buns, biscuits, white and   wheat breads, pizza crust and traditional  pasta made of white flour & egg white  - (vegetarian pasta or spinach or wheat pasta is OK).    - Multi-grain bread is OK - like multi-grain flat bread or  sandwich thins.   ^<^<^<^<^<^<^<^<^<^<^<^<^<^<^<^<^<^<^<^<^<^<^<^<^<^<^<^<^<^<^<^<^<^<^<^<^ ^>^>^>^>^>^>^>^>^>^>^>^>^>^>^>^>^>^>^>^>^>^>^>^>^>^>^>^>^>^>^>^>^>^>^>^>^  -  A1c - Normal - No Diabetes    - Great  !  ^<^<^<^<^<^<^<^<^<^<^<^<^<^<^<^<^<^<^<^<^<^<^<^<^<^<^<^<^<^<^<^<^<^<^<^<^ ^>^>^>^>^>^>^>^>^>^>^>^>^>^>^>^>^>^>^>^>^>^>^>^>^>^>^>^>^>^>^>^>^>^>^>^>^  -  Vitamin D = 90 - Excellent  - Please keep dosage same  ^<^<^<^<^<^<^<^<^<^<^<^<^<^<^<^<^<^<^<^<^<^<^<^<^<^<^<^<^<^<^<^<^<^<^<^<^ ^>^>^>^>^>^>^>^>^>^>^>^>^>^>^>^>^>^>^>^>^>^>^>^>^>^>^>^>^>^>^>^>^>^>^>^>^  -  All Else - CBC - Kidneys - Electrolytes - Liver - Magnesium & Thyroid    - all  Normal / OK  ^<^<^<^<^<^<^<^<^<^<^<^<^<^<^<^<^<^<^<^<^<^<^<^<^<^<^<^<^<^<^<^<^<^<^<^<^ ^>^>^>^>^>^>^>^>^>^>^>^>^>^>^>^>^>^>^>^>^>^>^>^>^>^>^>^>^>^>^>^>^>^>^>^>^

## 2023-07-25 NOTE — Progress Notes (Unsigned)
Assessment/Plan:   1.  Parkinsons Disease  -take carbidopa/levodopa 25/100 on the following schedule: , 2 at 5am/1 at 7am/2 at 9am/1 at 11am/ 2 at 1pm/ 1 at 3pm/1 at 5pm and patient has added 1 at bedtime.   Making her a bit sleepy but admits motor function is good  -now has power WC   -CT brain without contrast.  Hasn't had one in long time and has had multiple falls  2.  PDD with Parkinson's hallucinations  -She has reported that Nuplazid caused sleepiness.  Previously she had reported that it caused swelling.  He did not do this the second time we tried it, but  3.  Neurogenic Orthostatic Hypotension  -pt rarely using midodrine.    4.  Constipation  -taking miralax regularly.  Discussed increasing water intake.  Husband trying to encourage this.    5.  Sialorrhea  -This is commonly associated with PD.  We talked about treatments.  The patient is not a candidate for oral anticholinergic therapy because of increased risk of confusion and falls.  We discussed Botox (type A and B) and 1% atropine drops.  We discusssed that candy like lemon drops can help by stimulating mm of the oropharynx to induce swallowing.  She wants to hold for now.    Subjective:   Alexandra Lindsey was seen today in follow up for Parkinsons disease.  My previous records were reviewed prior to todays visit as well as outside records available to me.   This patient is accompanied in the office by her  husband  who supplements the history.  Patient reported that she retried Nuplazid and this time it caused sleepiness and she ended up stopping it.  She also felt that the sertraline that was being given by primary care was causing her to hallucinate, although we felt that this was doubtful and felt that it was the Parkinson's disease itself that was causing the hallucinations.  Nonetheless, we told her that we did not prescribe the sertraline and she would have to follow-up with her primary care.  We did tell her she could  stop the Nuplazid.  It looks like she did not call primary care at the time, but she did see primary care about 10 days ago.  She is still on the sertraline.  Primary care reported that the patient stopped her cholesterol medicine and her lipids were not well-controlled.  He did recheck them and total cholesterol was significantly elevated at 213 with triglycerides of 213 and HDL of 46.   Current prescribed movement disorder medications: Carbidopa/levodopa 25/100,  2 at 5am/1 at 7am/2 at 9am/1 at 11am/ 2 at 1pm/ 1 at 3pm/1 at 5pm (and 1 at bed) Midodrine 10 mg 3 times per day (states that only taking prn based on BP and states that she doesn't need to do it often - lightheaded spell few days/week)  PREVIOUS MEDICATIONS: Pramipexole (stopped because of hallucinations); stalevo; klonopin; rytary; nuplazid (states took it for a week and it made her swell; she retried it a second time and reported that it caused sleepiness); carbidopa/levodopa 50/200 at bedtime (patient just did not take it long after giving it and stopped for unknown reason)  ALLERGIES:   Allergies  Allergen Reactions   Lyrica [Pregabalin] Rash and Other (See Comments)    Weight gain and swelling in hands, legs and feet   Macrobid [Nitrofurantoin Macrocrystal] Swelling and Rash   Baclofen Other (See Comments)    Kept her awake,  affected memory   Cephalosporins Other (See Comments)    Cannot sleep   Codeine Nausea Only    dizziness   Darvocet [Propoxyphene N-Acetaminophen] Nausea And Vomiting   Diclofenac Rash    The gel caused a rash   Levaquin [Levofloxacin] Hives   Penicillins Rash   Selegiline Hcl Rash   Tramadol Nausea And Vomiting   Triamcinolone Itching    CURRENT MEDICATIONS:  Outpatient Encounter Medications as of 07/26/2023  Medication Sig   acetaminophen (TYLENOL) 500 MG tablet Take 1,000 mg by mouth 3 (three) times daily.   albuterol (PROVENTIL HFA;VENTOLIN HFA) 108 (90 Base) MCG/ACT inhaler Inhale 1-2 puffs  into the lungs every 6 (six) hours as needed for wheezing or shortness of breath.   carbidopa-levodopa (SINEMET IR) 25-100 MG tablet 2TABS AT 5AM 1TAB AT 7AM 2TABS AT 9AM 1TAB AT 11AM 2TABS AT 1PM 1TAB AT 3PM 1 TAB AT 5PM 1TAB AT BEDTIME   Cholecalciferol (VITAMIN D3) 5000 units CAPS Take 5,000 Units by mouth daily.   diclofenac Sodium (VOLTAREN) 1 % GEL Apply 4 g topically 4 (four) times daily. (Patient not taking: Reported on 07/12/2023)   dicyclomine (BENTYL) 20 MG tablet Take  1/2 to 1 tablet   3 x /day  bedfore Meals for abdominal Cramping, Bloating or Diarrhea (Patient taking differently: as needed. Take  1/2 to 1 tablet   3 x /day  bedfore Meals for abdominal Cramping, Bloating or Diarrhea)   hydrochlorothiazide (HYDRODIURIL) 25 MG tablet Take      1 tablet     Daily      For Fluid Retention / Ankle Swelling   methocarbamol (ROBAXIN) 500 MG tablet Take 500 mg by mouth as needed.   midodrine (PROAMATINE) 10 MG tablet Take 1/2-1 tab thee times daily for low blood pressure.   NON FORMULARY Arthritis cream   nystatin (MYCOSTATIN/NYSTOP) powder Apply under breasts 2-3 times a day.   ondansetron (ZOFRAN-ODT) 4 MG disintegrating tablet Take 4 mg by mouth every 8 (eight) hours as needed for nausea or vomiting.   OVER THE COUNTER MEDICATION Takes Mirlax daily   Probiotic Product (ALIGN) 4 MG CAPS Take 1 capsule by mouth daily.   rosuvastatin (CRESTOR) 20 MG tablet Take 1 tablet Daily for Cholesterol   sertraline (ZOLOFT) 100 MG tablet TAKE 1 AND 1/2 TABLETS BY MOUTH AT NIGHT   vitamin B-12 (CYANOCOBALAMIN) 500 MCG tablet Take 500 mcg by mouth daily.   No facility-administered encounter medications on file as of 07/26/2023.    Objective:   PHYSICAL EXAMINATION:    VITALS:   There were no vitals filed for this visit.    GEN:  The patient appears stated age and is in NAD. HEENT:  Normocephalic, atraumatic.  The mucous membranes are moist. The superficial temporal arteries are without ropiness  or tenderness.   Neurological examination:  Orientation: The patient is alert and oriented x3. Cranial nerves: There is good facial symmetry with facial hypomimia. The speech is fluent and clear.  She is slightly hypophonic.  Soft palate rises symmetrically and there is no tongue deviation. Hearing is intact to conversational tone. Sensation: Sensation is intact to light touch throughout Motor: Strength is at least antigravity x4.  Movement examination: Tone: There is normal tone in the UE/LE Abnormal movements: No dyskinesia today  Coordination:  There is no decremation in the upper or lower extremities, but testing in the left hand is limited because of tendon/orthopedic issues. Gait and Station: Not tested today  I have  reviewed and interpreted the following labs independently    Chemistry      Component Value Date/Time   NA 142 07/12/2023 1538   K 4.4 07/12/2023 1538   CL 109 07/12/2023 1538   CO2 26 07/12/2023 1538   BUN 29 (H) 07/12/2023 1538   CREATININE 0.99 (H) 07/12/2023 1538      Component Value Date/Time   CALCIUM 9.9 07/12/2023 1538   ALKPHOS 94 11/17/2016 1154   AST 9 (L) 07/12/2023 1538   ALT <3 (L) 07/12/2023 1538   BILITOT 0.3 07/12/2023 1538       Lab Results  Component Value Date   WBC 4.9 07/12/2023   HGB 11.2 (L) 07/12/2023   HCT 34.0 (L) 07/12/2023   MCV 94.7 07/12/2023   PLT 227 07/12/2023    Lab Results  Component Value Date   TSH 0.87 07/12/2023     Total time spent on today's visit was *** minutes, including both face-to-face time and nonface-to-face time.  Time included that spent on review of records (prior notes available to me/labs/imaging if pertinent), discussing treatment and goals, answering patient's questions and coordinating care.  Cc:  [name], MD

## 2023-07-26 ENCOUNTER — Ambulatory Visit (INDEPENDENT_AMBULATORY_CARE_PROVIDER_SITE_OTHER): Payer: Medicare Other | Admitting: Neurology

## 2023-07-26 ENCOUNTER — Encounter: Payer: Self-pay | Admitting: Neurology

## 2023-07-26 VITALS — BP 112/72 | HR 81 | Ht 60.0 in | Wt 140.5 lb

## 2023-07-26 DIAGNOSIS — G20B2 Parkinson's disease with dyskinesia, with fluctuations: Secondary | ICD-10-CM

## 2023-07-26 DIAGNOSIS — F068 Other specified mental disorders due to known physiological condition: Secondary | ICD-10-CM | POA: Diagnosis not present

## 2023-07-26 DIAGNOSIS — G20A1 Parkinson's disease without dyskinesia, without mention of fluctuations: Secondary | ICD-10-CM

## 2023-07-26 DIAGNOSIS — R441 Visual hallucinations: Secondary | ICD-10-CM

## 2023-07-26 MED ORDER — QUETIAPINE FUMARATE 25 MG PO TABS: 12.5000 mg | ORAL_TABLET | Freq: Two times a day (BID) | ORAL | 1 refills | Status: DC

## 2023-07-26 NOTE — Patient Instructions (Addendum)
Trial quetiapine 25 mg, 1/2 tablet at bedtime x 2 weeks and then start 1/2 tablet twice per day  SAVE THE DATE!  We are planning a Parkinsons Disease educational symposium at [facility] in [place] on October 11.  More details to come!  If you would like to be added to our email list to get further information, email [email or web address].  We hope to see you there!

## 2023-07-31 ENCOUNTER — Telehealth: Payer: Self-pay | Admitting: Neurology

## 2023-07-31 NOTE — Telephone Encounter (Signed)
Patient left a message on the VM to speak with someone with about how to take the Quetiapine

## 2023-08-01 NOTE — Telephone Encounter (Signed)
Called patient and went over instructions and she is aware of how to take her medication

## 2023-08-15 ENCOUNTER — Ambulatory Visit (INDEPENDENT_AMBULATORY_CARE_PROVIDER_SITE_OTHER): Payer: Medicare Other | Admitting: Nurse Practitioner

## 2023-08-15 ENCOUNTER — Encounter: Payer: Self-pay | Admitting: Nurse Practitioner

## 2023-08-15 VITALS — BP 118/70 | HR 92 | Temp 97.9°F | Resp 16 | Ht 60.0 in | Wt 141.0 lb

## 2023-08-15 DIAGNOSIS — R296 Repeated falls: Secondary | ICD-10-CM

## 2023-08-15 DIAGNOSIS — Z981 Arthrodesis status: Secondary | ICD-10-CM

## 2023-08-15 DIAGNOSIS — E782 Mixed hyperlipidemia: Secondary | ICD-10-CM

## 2023-08-15 DIAGNOSIS — R195 Other fecal abnormalities: Secondary | ICD-10-CM | POA: Diagnosis not present

## 2023-08-15 DIAGNOSIS — G4752 REM sleep behavior disorder: Secondary | ICD-10-CM | POA: Diagnosis not present

## 2023-08-15 DIAGNOSIS — E559 Vitamin D deficiency, unspecified: Secondary | ICD-10-CM | POA: Diagnosis not present

## 2023-08-15 DIAGNOSIS — Z79899 Other long term (current) drug therapy: Secondary | ICD-10-CM

## 2023-08-15 DIAGNOSIS — R6889 Other general symptoms and signs: Secondary | ICD-10-CM | POA: Diagnosis not present

## 2023-08-15 DIAGNOSIS — K219 Gastro-esophageal reflux disease without esophagitis: Secondary | ICD-10-CM

## 2023-08-15 DIAGNOSIS — K589 Irritable bowel syndrome without diarrhea: Secondary | ICD-10-CM | POA: Diagnosis not present

## 2023-08-15 DIAGNOSIS — M8000XS Age-related osteoporosis with current pathological fracture, unspecified site, sequela: Secondary | ICD-10-CM | POA: Diagnosis not present

## 2023-08-15 DIAGNOSIS — R0989 Other specified symptoms and signs involving the circulatory and respiratory systems: Secondary | ICD-10-CM

## 2023-08-15 DIAGNOSIS — Z0000 Encounter for general adult medical examination without abnormal findings: Secondary | ICD-10-CM

## 2023-08-15 DIAGNOSIS — K5900 Constipation, unspecified: Secondary | ICD-10-CM | POA: Diagnosis not present

## 2023-08-15 DIAGNOSIS — Z9181 History of falling: Secondary | ICD-10-CM

## 2023-08-15 DIAGNOSIS — R7309 Other abnormal glucose: Secondary | ICD-10-CM

## 2023-08-15 DIAGNOSIS — G20A1 Parkinson's disease without dyskinesia, without mention of fluctuations: Secondary | ICD-10-CM

## 2023-08-15 DIAGNOSIS — Z0001 Encounter for general adult medical examination with abnormal findings: Secondary | ICD-10-CM

## 2023-08-15 NOTE — Patient Instructions (Signed)
Food Basics for Chronic Kidney Disease Chronic kidney disease (CKD) is when your kidneys are not working well. They cannot remove waste, fluids, and other substances from your blood the way they should. These substances can build up, which can worsen kidney damage and affect how your body works. Eating certain foods can lead to a buildup of these substances. Changing your diet can help prevent more kidney damage. Diet changes may also delay dialysis or even keep you from needing it. What nutrients should I limit? Work with your treatment team and a food expert (dietitian) to make a meal plan that's right for you. Foods you can eat and foods you should limit or avoid will depend on the stage of your kidney disease and any other health conditions you have. The items listed below are not a complete list. Talk with your dietitian to learn what is best for you. Potassium Potassium affects how steadily your heart beats. Too much potassium in your blood can cause an irregular heartbeat or even a heart attack. You may need to limit foods that are high in potassium, such as: Liquid milk and soy milk. Salt substitutes that contain potassium. Fruits like bananas, apricots, nectarines, melon, prunes, raisins, kiwi, and oranges. Vegetables, such as potatoes, sweet potatoes, yams, tomatoes, leafy greens, beets, avocado, pumpkin, and winter squash. Beans, like lima beans. Nuts. Phosphorus Phosphorus is a mineral found in your bones. You need a balance between calcium and phosphorus to build and maintain healthy bones. Too much added phosphorus from the foods you eat can pull calcium from your bones. Losing calcium can make your bones weak and more likely to break. Too much phosphorus can also make your skin itch. You may need to limit foods that are high in phosphorus or that have added phosphorus, such as: Liquid milk and dairy products. Dark-colored sodas or soft drinks. Bran cereals and  oatmeal. Protein  Protein helps you make and keep muscle. Protein also helps to repair your body's cells and tissues. One of the natural breakdown products of protein is a waste product called urea. When your kidneys are not working well, they cannot remove types of waste like urea. Reducing protein in your diet can help keep urea from building up in your blood. Depending on your stage of kidney disease, you may need to eat smaller portions of foods that are high in protein. Sources of animal protein include: Meat (all types). Fish and seafood. Poultry. Eggs. Dairy. Other protein foods include: Beans and legumes. Nuts and nut butter. Soy, like tofu.  Sodium Salt (sodium) helps to keep a healthy balance of fluids in your body. Too much salt can increase your blood pressure, which can harm your heart and lungs. Extra salt can also cause your body to keep too much fluid, making your kidneys work harder. You may need to limit or avoid foods that are high in salt, such as: Salt seasonings. Soy and teriyaki sauce. Packaged, precooked, cured, or processed meats, such as sausages or meat loaves. Sardines. Salted crackers and snack foods. Fast food. Canned soups and most canned foods. Pickled foods. Vegetable juice. Boxed mixes or ready-to-eat boxed meals and side dishes. Bottled dressings, sauces, and marinades. Talk with your dietitian about how much potassium, phosphorus, protein, and salt you may have each day. Helpful tips Read food labels  Check the amount of salt in foods. Limit foods that have salt or sodium listed among the first five ingredients. Try to eat low-salt foods. Check the ingredient list  for added phosphorus or potassium. "Phos" in an ingredient is a sign that phosphorus has been added. Do not buy foods that are calcium-enriched or that have calcium added to them (are fortified). Buy canned vegetables and beans that say "no salt added" and rinse them before  eating. Lifestyle Limit the amount of protein you eat from animal sources each day. Focus on protein from plant sources, like tofu and dried beans, peas, and lentils. Do not add salt to food when cooking or before eating. Do not eat star fruit. It can be toxic for people with kidney problems. Talk with your health care provider before taking any vitamin or mineral supplements. If told by your health care provider, track how much liquid you drink so you can avoid drinking too much. You may need to include foods you eat that are made mostly from water, like gelatin, ice cream, soups, and juicy fruits and vegetables. If you have diabetes: If you have diabetes (diabetes mellitus) and CKD, you need to keep your blood sugar (glucose) in the target range recommended by your health care provider. Follow your diabetes management plan. This may include: Checking your blood glucose regularly. Taking medicines by mouth, or taking insulin, or both. Exercising for at least 30 minutes on 5 or more days each week, or as told by your health care provider. Tracking how many servings of carbohydrates you eat at each meal. Not using orange juice to treat low blood sugars. Instead, use apple juice, cranberry juice, or clear soda. You may be given guidelines on what foods and nutrients you may eat, and how much you can have each day. This depends on your stage of kidney disease and whether you have high blood pressure (hypertension). Follow the meal plan your dietitian gives you. To learn more: [employer] of Diabetes and Digestive and Kidney Diseases: [employer]: kidney.org Summary Chronic kidney disease (CKD) is when your kidneys are not working well. They cannot remove waste, fluids, and other substances from your blood the way they should. These substances can build up, which can worsen kidney damage and affect how your body works. Changing your diet can help prevent more  kidney damage. Diet changes may also delay dialysis or even keep you from needing it. Diet changes are different for each person with CKD. Work with a dietitian to set up a meal plan that is right for you. This information is not intended to replace advice given to you by your health care provider. Make sure you discuss any questions you have with your health care provider. Document Revised: 03/02/2022 Document Reviewed: 03/08/2020 Elsevier Patient Education  2024 [employer].

## 2023-08-15 NOTE — Progress Notes (Signed)
MEDICARE WELLNESS Assessment:   Diagnoses and all orders for this visit:  Annual Medicare Wellness Visit Due annually  Health maintenance reviewed  Labile hypertension Discussed DASH (Dietary Approaches to Stop Hypertension) DASH diet is lower in sodium than a typical American diet. Cut back on foods that are high in saturated fat, cholesterol, and trans fats. Eat more whole-grain foods, fish, poultry, and nuts Remain active and exercise as tolerated daily.  Monitor BP at home-Call if greater than 130/80.  Check CMP/CBC  Hyperlipidemia, mixed Discussed lifestyle modifications. Recommended diet heavy in fruits and veggies, omega 3's. Decrease consumption of animal meats, cheeses, and dairy products. Remain active and exercise as tolerated. Continue to monitor. Check lipids/TSH  Parkinson disease (HCC) Continue current regimen Follows with neurology, Dr [name] Will do home evaluation for medication and disease management, ADL's, self-care, education, in home help  Vitamin D deficiency Continue supplement. Monitor levels  Abnormal glucose  Education: Reviewed 'ABCs' of diabetes management  Discussed goals to be met and/or maintained include A1C (<7) Blood pressure (<130/80) Cholesterol (LDL <70) Continue Eye Exam yearly  Continue Dental Exam Q6 mo Discussed dietary recommendations Discussed Physical Activity recommendations Foot exam UTD Check A1C  Irritable bowel syndrome with constipation Doing well at this time, avoid triggers  Gastroesophageal reflux disease, unspecified whether esophagitis present No suspected reflux complications (Barret/stricture). Lifestyle modification:  wt loss, avoid meals 2-3h before bedtime. Consider eliminating food triggers:  chocolate, caffeine, EtOH, acid/spicy food.  Status post thoracic spinal fusion Lumbar radiculopathy Age-related osteoporosis with current pathological fracture, sequela Has seen Dr. Lorenso Courier Continue  tylenol/muscle relaxer, can use ibuprofen sparingly Lifestyle modification discussed  Medication management All medications discussed and reviewed in full. All questions and concerns regarding medications addressed.    Positive colorectal cancer screening using Cologuard test Declines further follow up Saw Dr. Madilyn Fireman, no colonoscopy  RBD (REM behavioral disorder) Continue meds  Chronic constipation Continue miralax, increase fiber indiet, INCREASE FLUID - goal 3-4 bottles Increase activity as able Add senokot daily Follow up if not improves or constipation longer than 3 days  High risk falls/weakness Wheelchair per neuro recommendation Will do home evaluation and PT/OT and HH transfer education, in home help If limited benefit with home health or coverage issues will transition to palliative/hospice involvement for additional resources     Future Appointments  Date Time Provider Department Center  01/15/2024  2:30 PM [name], MD [facility] None  01/31/2024  1:00 PM [name], [name], DO [facility] None  08/14/2024  2:00 PM [name], NP [facility] None     Plan:   Subjective:  Alexandra Lindsey is a 82 y.o. female who presents for 3 month follow and AWV. She has Parkinson disease (HCC); RBD (REM behavioral disorder); Hyperlipidemia; Hypertension; GERD (gastroesophageal reflux disease); Abnormal glucose; Vitamin D deficiency; Medication management; SI (sacroiliac) pain; Chronic pain syndrome; Lumbar radiculopathy; Lumbar stenosis with neurogenic claudication; Positive colorectal cancer screening using Cologuard test; Frequent falls; S/P spinal fusion; Postlaminectomy syndrome, not elsewhere classified; Osteoporosis; Stage 3a chronic kidney disease (HCC); and Constipation on their problem list.  She is accompanied by her husband who is her primary caregiver.  She reports more fatigue today.  She states that she is trial quetiapine which has made her sleepy.  She did  discuss with Neurology, Dr. Arbutus Leas during her follow up 07/26/23 decreasing levodopa which can help reduce hallucinations but increase tremors.  She plans to only assess the change to adding quetiapine at this time.  She plans to follow  up in 12/2023 (6 months).  She is very unstable r/t Parkinson's with high fall risk, numerous falls with injury, has transitioned from walker to Wheelchair per neuro recommendations.   Continues to have functional incontinence, wears depends. Husband is primary caregiver, has 24/7 needs, does pay out of pocket for in home help twice a week, and also has family helping.    She is currently prescribed sertraline 100 mg daily for mood, she does feel this helps, doing fairly.   She is seeing Dr Lorenso Courier related to multiple fractures and she is s/p thoracic spinal fusion. She reports chronic pain following, some good/bad days, but sitting in chair 20 min will cause severe pain, lying down helps. Taking tylenol 1000 mg TID, baclofen with variable benefit. Requests referral back to Dr. Lorenso Courier today.   She had a + cologuard in 2018, saw Dr. Madilyn Fireman who recommended monitoring only, declines further colonoscopy, no blood in stool. However she does report constipation ongoing, has been taking miralax, fiber supplement. Admits to very little activity, very poor water intake.   BMI has not been calculated.  Patient unable to stand.  She has not been working on diet and exercise. Wt Readings from Last 3 Encounters:  08/15/23 141 lb (64 kg)  07/26/23 140 lb 8 oz (63.7 kg)  03/08/23 143 lb (64.9 kg)   Her blood pressure has been controlled at home, today their BP is BP: 118/70 She does not workout. She denies chest pain, shortness of breath, dizziness.   She is not on cholesterol medication and denies myalgias. Her cholesterol is not at goal. The cholesterol last visit was:   Lab Results  Component Value Date   CHOL 213 (H) 07/12/2023   HDL 46 (L) 07/12/2023   LDLCALC 132 (H)  07/12/2023   TRIG 213 (H) 07/12/2023   CHOLHDL 4.6 07/12/2023   :  Lab Results  Component Value Date   HGBA1C 5.5 07/12/2023   She has CKD III monitored at this office. Slowly declining d/t not staying as hydrated as she once did, feels it triggers increase in incontinent episodes. She Last GFR Lab Results  Component Value Date   GFRNONAA 52 (L) 05/02/2021   GFRNONAA 53 (L) 01/18/2021   GFRNONAA 63 07/20/2020    Patient is on Vitamin D supplement.   Lab Results  Component Value Date   VD25OH 90 07/12/2023       Medication Review: Current Outpatient Medications on File Prior to Visit  Medication Sig Dispense Refill   acetaminophen (TYLENOL) 500 MG tablet Take 1,000 mg by mouth 3 (three) times daily.     albuterol (PROVENTIL HFA;VENTOLIN HFA) 108 (90 Base) MCG/ACT inhaler Inhale 1-2 puffs into the lungs every 6 (six) hours as needed for wheezing or shortness of breath.     carbidopa-levodopa (SINEMET IR) 25-100 MG tablet 2TABS AT 5AM 1TAB AT 7AM 2TABS AT 9AM 1TAB AT 11AM 2TABS AT 1PM 1TAB AT 3PM 1 TAB AT 5PM 1TAB AT BEDTIME 990 tablet 0   Cholecalciferol (VITAMIN D3) 5000 units CAPS Take 5,000 Units by mouth daily.     diclofenac Sodium (VOLTAREN) 1 % GEL Apply 4 g topically 4 (four) times daily. 350 g 1   dicyclomine (BENTYL) 20 MG tablet Take  1/2 to 1 tablet   3 x /day  bedfore Meals for abdominal Cramping, Bloating or Diarrhea (Patient taking differently: as needed. Take  1/2 to 1 tablet   3 x /day  bedfore Meals for abdominal Cramping, Bloating or  Diarrhea) 90 tablet 0   hydrochlorothiazide (HYDRODIURIL) 25 MG tablet Take      1 tablet     Daily      For Fluid Retention / Ankle Swelling 90 tablet 1   methocarbamol (ROBAXIN) 500 MG tablet Take 500 mg by mouth as needed.     midodrine (PROAMATINE) 10 MG tablet Take 1/2-1 tab thee times daily for low blood pressure. 180 tablet 1   NON FORMULARY Arthritis cream     nystatin (MYCOSTATIN/NYSTOP) powder Apply under breasts 2-3 times a  day. 60 g 2   ondansetron (ZOFRAN-ODT) 4 MG disintegrating tablet Take 4 mg by mouth every 8 (eight) hours as needed for nausea or vomiting.     OVER THE COUNTER MEDICATION Takes Mirlax daily     Probiotic Product (ALIGN) 4 MG CAPS Take 1 capsule by mouth daily.     QUEtiapine (SEROQUEL) 25 MG tablet Take 0.5 tablets (12.5 mg total) by mouth 2 (two) times daily. 90 tablet 1   rosuvastatin (CRESTOR) 20 MG tablet Take 1 tablet Daily for Cholesterol 90 tablet 3   sertraline (ZOLOFT) 100 MG tablet TAKE 1 AND 1/2 TABLETS BY MOUTH AT NIGHT 135 tablet 9   vitamin B-12 (CYANOCOBALAMIN) 500 MCG tablet Take 500 mcg by mouth daily.     No current facility-administered medications on file prior to visit.    Allergies: Allergies  Allergen Reactions   Lyrica [Pregabalin] Rash and Other (See Comments)    Weight gain and swelling in hands, legs and feet   Macrobid [Nitrofurantoin Macrocrystal] Swelling and Rash   Baclofen Other (See Comments)    Kept her awake, affected memory   Cephalosporins Other (See Comments)    Cannot sleep   Codeine Nausea Only    dizziness   Darvocet [Propoxyphene N-Acetaminophen] Nausea And Vomiting   Diclofenac Rash    The gel caused a rash   Levaquin [Levofloxacin] Hives   Penicillins Rash   Selegiline Hcl Rash   Tramadol Nausea And Vomiting   Triamcinolone Itching    Current Problems (verified) has Parkinson disease (HCC); RBD (REM behavioral disorder); Hyperlipidemia; Hypertension; GERD (gastroesophageal reflux disease); Abnormal glucose; Vitamin D deficiency; Medication management; SI (sacroiliac) pain; Chronic pain syndrome; Lumbar radiculopathy; Lumbar stenosis with neurogenic claudication; Positive colorectal cancer screening using Cologuard test; Frequent falls; S/P spinal fusion; Postlaminectomy syndrome, not elsewhere classified; Osteoporosis; Stage 3a chronic kidney disease (HCC); and Constipation on their problem list.  Screening Tests Immunization History   Administered Date(s) Administered   Influenza, High Dose Seasonal PF 09/08/2014, 10/04/2015, 09/02/2019, 08/30/2020, 09/26/2021, 10/12/2022   Influenza-Unspecified 09/29/2016, 08/08/2017, 08/22/2018   PFIZER(Purple Top)SARS-COV-2 Vaccination 12/22/2019, 01/23/2020, 09/28/2020   Pneumococcal Conjugate-13 11/10/2015   Pneumococcal Polysaccharide-23 02/26/2017   Pneumococcal-Unspecified 11/27/2006   Td 09/08/2013   Zoster, Live 06/27/2006    Preventative care:  Tetanus: 08/2013 Pneumonia: 2/2 Influenza: Due 08/2022 - plans to get 08/2023 Shingles - zoster 2007  Covid 19 2/2, 2021, pfizer + booster  Last colonoscopy: 2008 cologuard + 02/2017 saw Dr. Madilyn Fireman who didn't recommend colonoscopy, no sx other than constipation (chronic)  DEXA: 2019, osteoporosis, R forearm T-3.4, hip T -2.6, alendronate failed, declines further, would not pursue other treatment.  Mammogram: 09/2017, Dr. Ambrose Mantle, declines further  Names of Other Physician/Practitioners you currently use: 1. [facility] here for primary care 2. Dr. Hyacinth Meeker, eye doctor, last visit 2023, has reading glasses 3. Dr. Denita Lung, dentist, last visit 2023, goes q8m  Patient Care Team: [name],  MD as PCP - General (Internal Medicine) Dorena Cookey, MD (Inactive) as Consulting Physician (Gastroenterology) Lennette Bihari, MD as Consulting Physician (Cardiology) Judi Saa, DO as Attending Physician (Family Medicine) [name], [name], DO as Consulting Physician (Neurology) Eldred Manges, MD as Consulting Physician (Orthopedic Surgery) Powers, Patrick North, MD as Referring Physician (Neurosurgery) Powers, Johnnye Lana, MD as Referring Physician (Oral Surgery) Lajoyce Corners, [facility] (Inactive) as Pharmacist (Pharmacist)  Surgical: She  has a past surgical history that includes Abdominal hysterectomy; Hernia repair; Rotator cuff repair (Left, 10-07-12); Bunionectomy (Bilateral, 11-11-3); Cardiac  catheterization (Bilateral, 2013); left heart catheterization with coronary angiogram (N/A, 07/03/2012); Appendectomy; Colonoscopy; Back surgery (12/20/2015); Repair of cerebrospinal fluid leak (N/A, 12/24/2015); Back surgery (09/27/2017); and Back surgery (2020). Family Her family history includes CVA in her brother; Esophageal cancer in her mother; Heart attack in her mother; Hypercholesterolemia in her brother; Hypertension in her brother; Mental retardation in her brother; Suicidality in her brother and father. Social history  She reports that she has never smoked. She has never used smokeless tobacco. She reports that she does not drink alcohol and does not use drugs.    MEDICARE WELLNESS OBJECTIVES: Physical activity:   Cardiac risk factors:   Depression/mood screen:      07/12/2023   12:37 AM  Depression screen PHQ 2/9  Decreased Interest 0  Down, Depressed, Hopeless 0  PHQ - 2 Score 0    ADLs:     07/12/2023   12:36 AM 11/15/2022   11:07 PM  In your present state of health, do you have any difficulty performing the following activities:  Hearing?  0  Vision? 0 0  Difficulty concentrating or making decisions? 1 0  Walking or climbing stairs? 1 0  Dressing or bathing? 1 0  Doing errands, shopping? 1 0     Cognitive Testing  Alert? Yes  Normal Appearance?Yes  Oriented to person? Yes  Place? Yes   Time? Yes  Recall of three objects?  Yes  Can perform simple calculations? Yes  Displays appropriate judgment?Yes  Can read the correct time from a watch face?Yes  EOL planning:     Objective:   Today's Vitals   08/15/23 1347  BP: 118/70  Pulse: 92  Resp: 16  Temp: 97.9 F (36.6 C)  SpO2: 96%  Weight: 141 lb (64 kg)  Height: 5' (1.524 m)      General appearance: Wheelchair, alert, no distress, WD/WN, female HEENT: normocephalic, sclerae anicteric, TMs pearly, nares patent, no discharge or erythema, pharynx normal, Oral cavity: MMM, no lesions Neck: supple, no  lymphadenopathy, no thyromegaly, no masses Heart: RRR, normal S1, S2, no murmurs Lungs: CTA bilaterally, no wheezes, rhonchi, or rales Abdomen: +bs, rounded, soft, non tender, no palpable masses, no hepatomegaly, no splenomegaly Musculoskeletal: Obvious bony enlargement at MCP, DIP, PIP joints without erythema, heat, effusion, with mild ulnar deviation deformity bilaterally; Scoliosis, hunched over in wheelchair.  Skin: Free of any rashes, lesions, or ecchymosis.   Extremities: no edema, no cyanosis, no clubbing Pulses: 2+ symmetric, upper and lower extremities, normal cap refill Neurological: alert, oriented x 3, CN2-12 intact, strength decreased upper extremities and lower extremities, sensation normal throughout, has baseline tremor, mild mask facies. In wheelchair, gait not assessed.   Psychiatric: normal affect, behavior normal, pleasant    Medicare Attestation I have personally reviewed: The patient's medical and social history Their use of alcohol, tobacco or illicit drugs Their current medications and supplements The patient's functional ability including ADLs,fall risks, home  safety risks, cognitive, and hearing and visual impairment Diet and physical activities Evidence for depression or mood disorders  The patient's weight, height, BMI, and visual acuity have been recorded in the chart.  I have made referrals, counseling, and provided education to the patient based on review of the above and I have provided the patient with a written personalized care plan for preventive services.      [name], NP   04/06/2020

## 2023-09-14 ENCOUNTER — Other Ambulatory Visit: Payer: Self-pay | Admitting: Neurology

## 2023-09-14 DIAGNOSIS — G20A1 Parkinson's disease without dyskinesia, without mention of fluctuations: Secondary | ICD-10-CM

## 2023-11-01 ENCOUNTER — Other Ambulatory Visit: Payer: Self-pay | Admitting: Neurology

## 2023-11-06 DIAGNOSIS — R3915 Urgency of urination: Secondary | ICD-10-CM | POA: Diagnosis not present

## 2023-11-06 DIAGNOSIS — R300 Dysuria: Secondary | ICD-10-CM | POA: Diagnosis not present

## 2023-11-06 DIAGNOSIS — R3911 Hesitancy of micturition: Secondary | ICD-10-CM | POA: Diagnosis not present

## 2023-12-14 ENCOUNTER — Other Ambulatory Visit: Payer: Self-pay | Admitting: Neurology

## 2023-12-14 DIAGNOSIS — G20A1 Parkinson's disease without dyskinesia, without mention of fluctuations: Secondary | ICD-10-CM

## 2024-01-15 ENCOUNTER — Ambulatory Visit: Payer: Medicare Other | Admitting: Internal Medicine

## 2024-01-30 NOTE — Progress Notes (Deleted)
 Assessment/Plan:   1.  Parkinsons Disease  -take carbidopa/levodopa 25/100 on the following schedule:  2 at 5am/1 at 7am/2 at 9am/1 at 11am/ 2 at 1pm/ 1 at 3pm/1 at 5pm and patient has added 1 at bedtime.   Making her a bit sleepy but admits motor function is good  -now has power WC    2.  PDD with Parkinson's hallucinations  -She has reported that Nuplazid caused sleepiness.  Previously she had reported that it caused swelling.  it did not do this the second time we tried it, but she reported EDS.  We could try quetiapine but its more likely to cause EDS.  I also told her we could decrease the levodopa dosage to see if that would help hallucinations.  It would sacrifice motor control.  She is not supposed to be ambulating but she does and it would cause more potential falls.  After a long discussion, we decided to start quetiapine, 12.5 mg q hs x 2 weeks x then 1/2 po bid thereafter.  Discussed r/b/se extensively including black box warning.  Discussed interaction with sertraline  3.  Neurogenic Orthostatic Hypotension  -pt rarely using midodrine.    4.  Constipation  -taking miralax regularly.  Discussed increasing water intake.  Husband trying to encourage this.    5.  Sialorrhea  -This is commonly associated with PD.  We talked about treatments.  The patient is not a candidate for oral anticholinergic therapy because of increased risk of confusion and falls.  We discussed Botox (type A and B) and 1% atropine drops.  We discusssed that candy like lemon drops can help by stimulating mm of the oropharynx to induce swallowing.  She wants to hold for now.    Subjective:   Alexandra Lindsey was seen today in follow up for Parkinsons disease.  My previous records were reviewed prior to todays visit as well as outside records available to me.   This patient is accompanied in the office by her  husband  who supplements the history.  Last visit she had stopped the Nuplazid and stated it caused  sleepiness.  I told her that quetiapine would likely cause more sleepiness than Nuplazid, as this is generally not a known side effect with Nuplazid, but we decided to try it at very low-dose.  She has been very sensitive to medication.  She was given quetiapine, 25 mg, half tablet twice per day for hallucinations.  We did talk about potentially decreasing the levodopa to see if that would help, but she really did not want to trial that option.  current prescribed movement disorder medications: Carbidopa/levodopa 25/100,  2 at 5am/1 at 7am/2 at 9am/1 at 11am/ 2 at 1pm/ 1 at 3pm/1 at 5pm (and 1 at bed) Midodrine 10 mg 3 times per day (states that only taking prn based on BP and states that she doesn't need to do it often - lightheaded spell few days/week) , 25 mg, half tablet twice per day (started last visit)  PREVIOUS MEDICATIONS: Pramipexole (stopped because of hallucinations); stalevo; klonopin; rytary; nuplazid (states took it for a week and it made her swell; she retried it a second time and reported that it caused sleepiness); carbidopa/levodopa 50/200 at bedtime (patient just did not take it long after giving it and stopped for unknown reason)  ALLERGIES:   Allergies  Allergen Reactions   Lyrica [Pregabalin] Rash and Other (See Comments)    Weight gain and swelling in hands, legs and  feet   Macrobid [Nitrofurantoin Macrocrystal] Swelling and Rash   Baclofen Other (See Comments)    Kept her awake, affected memory   Cephalosporins Other (See Comments)    Cannot sleep   Codeine Nausea Only    dizziness   Darvocet [Propoxyphene N-Acetaminophen] Nausea And Vomiting   Diclofenac Rash    The gel caused a rash   Levaquin [Levofloxacin] Hives   Penicillins Rash   Selegiline Hcl Rash   Tramadol Nausea And Vomiting   Triamcinolone Itching    CURRENT MEDICATIONS:  Outpatient Encounter Medications as of 01/31/2024  Medication Sig   acetaminophen (TYLENOL) 500 MG tablet Take 1,000 mg by  mouth 3 (three) times daily.   albuterol (PROVENTIL HFA;VENTOLIN HFA) 108 (90 Base) MCG/ACT inhaler Inhale 1-2 puffs into the lungs every 6 (six) hours as needed for wheezing or shortness of breath.   carbidopa-levodopa (SINEMET IR) 25-100 MG tablet TAKE 2 AT 5AM/1 AT 7AM/2 AT 9AM/1 AT 11AM/ 2 AT 1PM/ 1 AT 3PM/1 AT 5PM AND PATIENT HAS ADDED 1 AT BEDTIME.   Cholecalciferol (VITAMIN D3) 5000 units CAPS Take 5,000 Units by mouth daily.   diclofenac Sodium (VOLTAREN) 1 % GEL Apply 4 g topically 4 (four) times daily.   dicyclomine (BENTYL) 20 MG tablet Take  1/2 to 1 tablet   3 x /day  bedfore Meals for abdominal Cramping, Bloating or Diarrhea (Patient taking differently: as needed. Take  1/2 to 1 tablet   3 x /day  bedfore Meals for abdominal Cramping, Bloating or Diarrhea)   hydrochlorothiazide (HYDRODIURIL) 25 MG tablet Take      1 tablet     Daily      For Fluid Retention / Ankle Swelling   methocarbamol (ROBAXIN) 500 MG tablet Take 500 mg by mouth as needed.   midodrine (PROAMATINE) 10 MG tablet Take 1/2-1 tab thee times daily for low blood pressure.   NON FORMULARY Arthritis cream   nystatin (MYCOSTATIN/NYSTOP) powder Apply under breasts 2-3 times a day.   ondansetron (ZOFRAN-ODT) 4 MG disintegrating tablet Take 4 mg by mouth every 8 (eight) hours as needed for nausea or vomiting.   OVER THE COUNTER MEDICATION Takes Mirlax daily   Probiotic Product (ALIGN) 4 MG CAPS Take 1 capsule by mouth daily.   QUEtiapine (SEROQUEL) 25 MG tablet TAKE 0.5 TABLETS BY MOUTH 2 TIMES DAILY.   rosuvastatin (CRESTOR) 20 MG tablet Take 1 tablet Daily for Cholesterol   sertraline (ZOLOFT) 100 MG tablet TAKE 1 AND 1/2 TABLETS BY MOUTH AT NIGHT   vitamin B-12 (CYANOCOBALAMIN) 500 MCG tablet Take 500 mcg by mouth daily.   No facility-administered encounter medications on file as of 01/31/2024.    Objective:   PHYSICAL EXAMINATION:    VITALS:   There were no vitals filed for this visit.     GEN:  The patient  appears stated age and is in NAD. HEENT:  Normocephalic, atraumatic.  The mucous membranes are moist. The superficial temporal arteries are without ropiness or tenderness.   Neurological examination:  Orientation: The patient is alert and oriented x3. Cranial nerves: There is good facial symmetry with facial hypomimia. The speech is fluent and clear.  Soft palate rises symmetrically and there is no tongue deviation. Hearing is intact to conversational tone. Sensation: Sensation is intact to light touch throughout Motor: Strength is at least antigravity x4.  Movement examination: Tone: There is normal tone in the UE/LE (and she is just past due for a dose) Abnormal movements: No dyskinesia  today  Coordination:  There is no decremation in the upper or lower extremities, but testing in the left hand is limited because of tendon/orthopedic issues. Gait and Station: Not tested today  I have reviewed and interpreted the following labs independently    Chemistry      Component Value Date/Time   NA 142 07/12/2023 1538   K 4.4 07/12/2023 1538   CL 109 07/12/2023 1538   CO2 26 07/12/2023 1538   BUN 29 (H) 07/12/2023 1538   CREATININE 0.99 (H) 07/12/2023 1538      Component Value Date/Time   CALCIUM 9.9 07/12/2023 1538   ALKPHOS 94 11/17/2016 1154   AST 9 (L) 07/12/2023 1538   ALT <3 (L) 07/12/2023 1538   BILITOT 0.3 07/12/2023 1538       Lab Results  Component Value Date   WBC 4.9 07/12/2023   HGB 11.2 (L) 07/12/2023   HCT 34.0 (L) 07/12/2023   MCV 94.7 07/12/2023   PLT 227 07/12/2023    Lab Results  Component Value Date   TSH 0.87 07/12/2023     Total time spent on today's visit was *** minutes, including both face-to-face time and nonface-to-face time.  Time included that spent on review of records (prior notes available to me/labs/imaging if pertinent), discussing treatment and goals, answering patient's questions and coordinating care.  Cc:  [name], MD

## 2024-01-31 ENCOUNTER — Ambulatory Visit: Payer: Medicare Other | Admitting: Neurology

## 2024-01-31 ENCOUNTER — Encounter: Payer: Self-pay | Admitting: *Deleted

## 2024-02-21 ENCOUNTER — Encounter: Payer: Self-pay | Admitting: Family Medicine

## 2024-02-21 ENCOUNTER — Ambulatory Visit (INDEPENDENT_AMBULATORY_CARE_PROVIDER_SITE_OTHER): Payer: Medicare Other | Admitting: Family Medicine

## 2024-02-21 VITALS — BP 120/84 | HR 76 | Temp 97.5°F | Ht 60.0 in | Wt 134.6 lb

## 2024-02-21 DIAGNOSIS — R296 Repeated falls: Secondary | ICD-10-CM | POA: Diagnosis not present

## 2024-02-21 DIAGNOSIS — F419 Anxiety disorder, unspecified: Secondary | ICD-10-CM | POA: Diagnosis not present

## 2024-02-21 DIAGNOSIS — M3500 Sicca syndrome, unspecified: Secondary | ICD-10-CM

## 2024-02-21 DIAGNOSIS — R220 Localized swelling, mass and lump, head: Secondary | ICD-10-CM | POA: Diagnosis not present

## 2024-02-21 DIAGNOSIS — G20B2 Parkinson's disease with dyskinesia, with fluctuations: Secondary | ICD-10-CM

## 2024-02-21 NOTE — Patient Instructions (Signed)
 VISIT SUMMARY:  Today, we discussed several health concerns including your Parkinson's disease, facial swelling, dry eyes, and anxiety. We reviewed your current medications and made plans for ongoing management and follow-up.  YOUR PLAN:  -PARKINSON'S DISEASE: Parkinson's disease is a long-term disorder of the nervous system that affects movement. You have been managing this condition for 22 years with symptoms like balance problems, frequent falls, and episodes where you cannot move. You will continue your current medication regimen of carbidopa-levodopa (Sinemet) and quetiapine for hallucinations. You are scheduled to see your neurologist, Dr. Arcelia Jew, in June for further management. Please continue using assistive devices like canes and walkers to prevent falls and communicate openly about any new symptoms or needs for additional support.  -FACIAL SWELLING AND HYPERSECRETION: You have been experiencing facial swelling on the left side for about a year, likely due to a hypersecreting parotid gland causing excessive saliva production and drooling, especially at night. We will continue to monitor this condition.  -DRY EYES: Dry eyes occur when your eyes do not produce enough tears or the right quality of tears. You have had dry eyes for about a year and a half, and your ophthalmologist, Dr. Blima Ledger, has confirmed this diagnosis. We will continue to monitor your symptoms and consider if allergies might be contributing, even though you have no history of allergies.  -ANXIETY: Anxiety is a feeling of worry or fear that can be managed with medication. Your anxiety is currently well-managed with sertraline (Zoloft) 100 mg daily. Please continue this medication as prescribed.  -GENERAL HEALTH MAINTENANCE: You are taking vitamin D and B12 supplements as recommended by your previous physician, even though you do not have deficiencies. Please continue these supplements as advised.  INSTRUCTIONS:  Please  follow up with your neurologist, Dr. Arcelia Jew, in June for further management of your Parkinson's disease. Continue taking your medications as prescribed and use assistive devices to prevent falls. If you notice any new symptoms or need additional support, communicate openly with your healthcare providers.
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
## 2024-03-06 ENCOUNTER — Telehealth: Payer: Self-pay | Admitting: Neurology

## 2024-03-06 NOTE — Telephone Encounter (Signed)
 Left message with the after hour service on 03-05-24  5:39 pm  Caller states that patient got a call and it was a scam. He is not sure all the information that was given out to the person on the phone. He has taken steps to freeze the credit line etc just to be on the safe side.  Just wanted [place] to know because she did tell the caller her Dr Names because they stated that they were going to send a test to the providers. When the patient asked for a telephone number to call them back the person hung up the phone.

## 2024-03-13 ENCOUNTER — Other Ambulatory Visit: Payer: Self-pay | Admitting: Neurology

## 2024-03-13 DIAGNOSIS — G20A1 Parkinson's disease without dyskinesia, without mention of fluctuations: Secondary | ICD-10-CM

## 2024-03-13 NOTE — Telephone Encounter (Signed)
 Pateitn last seen in August of 24 next appointment is scheduled for 06/25 approval to send meds

## 2024-04-16 ENCOUNTER — Other Ambulatory Visit: Payer: Self-pay | Admitting: Neurology

## 2024-05-07 NOTE — Progress Notes (Signed)
 Assessment/Plan:   1.  Parkinsons Disease  -take carbidopa /levodopa  25/100 on the following schedule:  2 at 5am/1 at 7am/2 at 9am/1 at 11am/ 2 at 1pm/ 1 at 3pm/1 at 5pm and 1 at bedtime.   Making her a bit sleepy but admits motor function is good  -another LONG, LONG discussion on NOT WALKING unless someone is with her directly.  She well understands the consequences of falls   2.  PDD with Parkinson's hallucinations  -She has reported that Nuplazid  caused sleepiness.  Previously she had reported that it caused swelling.  it did not do this the second time we tried it, but she reported EDS.  She is tolerating seroquel  12.5 mg bid and doesn't want to increase because of EDS.    3.  Neurogenic Orthostatic Hypotension  -not taking midodrine  and BP quite low.  Unclear is some of falls from orthostasis.  told her to take 5 mg tid with meals faithfully.  4.  Constipation  -taking miralax  regularly.  Discussed increasing water  intake.  Husband trying to encourage this.    5.  Sialorrhea  -This is commonly associated with PD.  We talked about treatments.  The patient is not a candidate for oral anticholinergic therapy because of increased risk of confusion and falls.  We discussed Botox (type A and B) and 1% atropine drops.  We discusssed that candy like lemon drops can help by stimulating mm of the oropharynx to induce swallowing.  She wants to hold for now.    Subjective:   Alexandra Lindsey was seen today in follow up for Parkinsons disease.  My previous records were reviewed prior to todays visit as well as outside records available to me.   This patient is accompanied in the office by her  husband  who supplements the history.  Patient has not been seen since last August.  She missed an appointment a few months ago.  Her husband did call me in April stating that the patient had gotten caught up in a telemarketing scam.  Separately, we did start her on quetiapine  last visit had very low dosage.   She reports that hallucinations are much better but she still has them.  She doesn't want to go up on that medication because she is already sleepy.  She is not following with a new primary care physician.  Notes are reviewed.  Her BP is low but she is not taking her midodrine  at all.  Her BP has been very low and she is dizzy.   She isn't using her power WC because she crashed it into the wall too much.  The battery is now dead.  No PT in few years.  Multiple falls.  Doesn't use walker/WC to walk.  In one instance she pulled the bifold closet door right on top of her when she fell and pulled the doors right out of the sheetrock and they fell on her.     current prescribed movement disorder medications: Carbidopa /levodopa  25/100,  2 at 5am/1 at 7am/2 at 9am/1 at 11am/ 2 at 1pm/ 1 at 3pm/1 at 5pm (and 1 at bed) Midodrine  10 mg 3 times per day (she has stopped it) , 25 mg, half tablet twice per day  Quetiapine  12.5 mg twice daily   PREVIOUS MEDICATIONS: Pramipexole  (stopped because of hallucinations); stalevo ; klonopin ; rytary ; nuplazid  (states took it for a week and it made her swell; she retried it a second time and reported that it caused sleepiness); carbidopa /levodopa   50/200 at bedtime (patient just did not take it long after giving it and stopped for unknown reason)   ALLERGIES:   Allergies  Allergen Reactions   Lyrica  [Pregabalin ] Rash and Other (See Comments)    Weight gain and swelling in hands, legs and feet   Macrobid [Nitrofurantoin Macrocrystal] Swelling and Rash   Baclofen  Other (See Comments)    Kept her awake, affected memory   Cephalosporins Other (See Comments)    Cannot sleep   Codeine Nausea Only    dizziness   Darvocet [Propoxyphene N-Acetaminophen ] Nausea And Vomiting   Diclofenac  Rash    The gel caused a rash   Levaquin [Levofloxacin] Hives   Penicillins Rash   Selegiline Hcl Rash   Tramadol Nausea And Vomiting   Triamcinolone  Itching    CURRENT MEDICATIONS:   Outpatient Encounter Medications as of 05/08/2024  Medication Sig   acetaminophen  (TYLENOL ) 500 MG tablet Take 1,000 mg by mouth 3 (three) times daily.   carbidopa -levodopa  (SINEMET  IR) 25-100 MG tablet TAKE 2 AT 5AM/1 AT 7AM/2 AT 9AM/1 AT 11AM/ 2 AT 1PM/ 1 AT 3PM/1 AT 5PM AND PATIENT HAS ADDED 1 AT BEDTIME.   Cholecalciferol  (VITAMIN D3) 5000 units CAPS Take 5,000 Units by mouth daily.   QUEtiapine  (SEROQUEL ) 25 MG tablet TAKE 0.5 TABLETS BY MOUTH 2 TIMES DAILY.   sertraline  (ZOLOFT ) 100 MG tablet TAKE 1 AND 1/2 TABLETS BY MOUTH AT NIGHT   vitamin B-12 (CYANOCOBALAMIN ) 500 MCG tablet Take 500 mcg by mouth daily.   [DISCONTINUED] albuterol (PROVENTIL HFA;VENTOLIN HFA) 108 (90 Base) MCG/ACT inhaler Inhale 1-2 puffs into the lungs every 6 (six) hours as needed for wheezing or shortness of breath. (Patient not taking: Reported on 05/08/2024)   [DISCONTINUED] diclofenac  Sodium (VOLTAREN ) 1 % GEL Apply 4 g topically 4 (four) times daily. (Patient not taking: Reported on 02/21/2024)   [DISCONTINUED] dicyclomine  (BENTYL ) 20 MG tablet Take  1/2 to 1 tablet   3 x /day  bedfore Meals for abdominal Cramping, Bloating or Diarrhea (Patient not taking: Reported on 02/21/2024)   [DISCONTINUED] methocarbamol  (ROBAXIN ) 500 MG tablet Take 500 mg by mouth as needed. (Patient not taking: Reported on 02/21/2024)   [DISCONTINUED] midodrine  (PROAMATINE ) 10 MG tablet Take 1/2-1 tab thee times daily for low blood pressure. (Patient not taking: Reported on 02/21/2024)   [DISCONTINUED] NON FORMULARY Arthritis cream (Patient not taking: Reported on 02/21/2024)   [DISCONTINUED] ondansetron  (ZOFRAN -ODT) 4 MG disintegrating tablet Take 4 mg by mouth every 8 (eight) hours as needed for nausea or vomiting. (Patient not taking: Reported on 02/21/2024)   [DISCONTINUED] OVER THE COUNTER MEDICATION Takes Mirlax daily (Patient not taking: Reported on 02/21/2024)   [DISCONTINUED] Probiotic Product (ALIGN) 4 MG CAPS Take 1 capsule by mouth daily.  (Patient not taking: Reported on 02/21/2024)   No facility-administered encounter medications on file as of 05/08/2024.    Objective:   PHYSICAL EXAMINATION:    VITALS:   Vitals:   05/08/24 1247  BP: (!) 100/40  Pulse: 91  SpO2: 96%    GEN:  The patient appears stated age and is in NAD. HEENT:  Normocephalic, atraumatic.  The mucous membranes are moist. The superficial temporal arteries are without ropiness or tenderness.   Neurological examination:  Orientation: The patient is alert and oriented x3. Cranial nerves: There is good facial symmetry with facial hypomimia. The speech is fluent and clear.  She is hypophonic.  Soft palate rises symmetrically and there is no tongue deviation. Hearing is intact to conversational tone.  Sensation: Sensation is intact to light touch throughout Motor: Strength is at least antigravity x4.  Movement examination: Tone: There is normal tone in the UE/LE  Abnormal movements: mild dyskinesia in the L foot (just took her meds) Coordination:  There is no decremation in the upper or lower extremities, but testing in the left hand is limited because of tendon/orthopedic issues.  This is stable Gait and Station: Not tested today  I have reviewed and interpreted the following labs independently    Chemistry      Component Value Date/Time   NA 142 07/12/2023 1538   K 4.4 07/12/2023 1538   CL 109 07/12/2023 1538   CO2 26 07/12/2023 1538   BUN 29 (H) 07/12/2023 1538   CREATININE 0.99 (H) 07/12/2023 1538      Component Value Date/Time   CALCIUM  9.9 07/12/2023 1538   ALKPHOS 94 11/17/2016 1154   AST 9 (L) 07/12/2023 1538   ALT <3 (L) 07/12/2023 1538   BILITOT 0.3 07/12/2023 1538       Lab Results  Component Value Date   WBC 4.9 07/12/2023   HGB 11.2 (L) 07/12/2023   HCT 34.0 (L) 07/12/2023   MCV 94.7 07/12/2023   PLT 227 07/12/2023    Lab Results  Component Value Date   TSH 0.87 07/12/2023     Total time spent on today's visit  was 40 minutes, including both face-to-face time and nonface-to-face time.  Time included that spent on review of records (prior notes available to me/labs/imaging if pertinent), discussing treatment and goals, answering patient's questions and coordinating care.  Cc:  Catheryn Cluck, MD

## 2024-05-08 ENCOUNTER — Ambulatory Visit (INDEPENDENT_AMBULATORY_CARE_PROVIDER_SITE_OTHER): Admitting: Neurology

## 2024-05-08 ENCOUNTER — Encounter: Payer: Self-pay | Admitting: Neurology

## 2024-05-08 VITALS — BP 100/40 | HR 91 | Ht 60.0 in | Wt 140.0 lb

## 2024-05-08 DIAGNOSIS — G20B1 Parkinson's disease with dyskinesia, without mention of fluctuations: Secondary | ICD-10-CM

## 2024-05-08 MED ORDER — MIDODRINE HCL 5 MG PO TABS: 5.0000 mg | ORAL_TABLET | Freq: Three times a day (TID) | ORAL | 1 refills | Status: DC

## 2024-05-08 NOTE — Patient Instructions (Signed)
 SAVE THE DATE!  We are planning a Parkinsons Disease educational symposium at [facility] in [place] on September 19.  More details to come!  We will have a movement disorder physician expert from [employer] coming to speak and a caregiver speaker.  We will have a panel of experts that will show you who you may need on your team of people on your journey with Parkinsons.  If you would like to be added to our email list to get further information, email [email or web address].  I hope to see you there!

## 2024-05-10 ENCOUNTER — Other Ambulatory Visit: Payer: Self-pay | Admitting: Neurology

## 2024-05-27 ENCOUNTER — Ambulatory Visit (INDEPENDENT_AMBULATORY_CARE_PROVIDER_SITE_OTHER): Admitting: Family Medicine

## 2024-05-27 ENCOUNTER — Encounter: Payer: Self-pay | Admitting: Family Medicine

## 2024-05-27 ENCOUNTER — Telehealth: Payer: Self-pay

## 2024-05-27 VITALS — BP 100/60 | HR 89 | Temp 97.6°F | Ht 60.0 in | Wt 130.2 lb

## 2024-05-27 DIAGNOSIS — R0989 Other specified symptoms and signs involving the circulatory and respiratory systems: Secondary | ICD-10-CM

## 2024-05-27 DIAGNOSIS — E559 Vitamin D deficiency, unspecified: Secondary | ICD-10-CM | POA: Diagnosis not present

## 2024-05-27 DIAGNOSIS — G20B2 Parkinson's disease with dyskinesia, with fluctuations: Secondary | ICD-10-CM

## 2024-05-27 DIAGNOSIS — N1831 Chronic kidney disease, stage 3a: Secondary | ICD-10-CM | POA: Diagnosis not present

## 2024-05-27 DIAGNOSIS — Z79899 Other long term (current) drug therapy: Secondary | ICD-10-CM

## 2024-05-27 DIAGNOSIS — K219 Gastro-esophageal reflux disease without esophagitis: Secondary | ICD-10-CM

## 2024-05-27 DIAGNOSIS — K117 Disturbances of salivary secretion: Secondary | ICD-10-CM

## 2024-05-27 DIAGNOSIS — E782 Mixed hyperlipidemia: Secondary | ICD-10-CM

## 2024-05-27 DIAGNOSIS — I950 Idiopathic hypotension: Secondary | ICD-10-CM

## 2024-05-27 DIAGNOSIS — R7309 Other abnormal glucose: Secondary | ICD-10-CM

## 2024-05-27 DIAGNOSIS — M8000XS Age-related osteoporosis with current pathological fracture, unspecified site, sequela: Secondary | ICD-10-CM | POA: Diagnosis not present

## 2024-05-27 LAB — MICROALBUMIN / CREATININE URINE RATIO
Creatinine,U: 340 mg/dL
Microalb Creat Ratio: 20.3 mg/g (ref 0.0–30.0)
Microalb, Ur: 6.9 mg/dL — ABNORMAL HIGH (ref 0.0–1.9)

## 2024-05-27 LAB — TSH: TSH: 1.19 u[IU]/mL (ref 0.35–5.50)

## 2024-05-27 LAB — CBC WITH DIFFERENTIAL/PLATELET
Basophils Absolute: 0 10*3/uL (ref 0.0–0.1)
Basophils Relative: 0.8 % (ref 0.0–3.0)
Eosinophils Absolute: 0.1 10*3/uL (ref 0.0–0.7)
Eosinophils Relative: 1.3 % (ref 0.0–5.0)
HCT: 35.9 % — ABNORMAL LOW (ref 36.0–46.0)
Hemoglobin: 11.9 g/dL — ABNORMAL LOW (ref 12.0–15.0)
Lymphocytes Relative: 20 % (ref 12.0–46.0)
Lymphs Abs: 0.9 10*3/uL (ref 0.7–4.0)
MCHC: 33.2 g/dL (ref 30.0–36.0)
MCV: 92.8 fl (ref 78.0–100.0)
Monocytes Absolute: 0.5 10*3/uL (ref 0.1–1.0)
Monocytes Relative: 9.8 % (ref 3.0–12.0)
Neutro Abs: 3.2 10*3/uL (ref 1.4–7.7)
Neutrophils Relative %: 68.1 % (ref 43.0–77.0)
Platelets: 229 10*3/uL (ref 150.0–400.0)
RBC: 3.87 Mil/uL (ref 3.87–5.11)
RDW: 12.9 % (ref 11.5–15.5)
WBC: 4.7 10*3/uL (ref 4.0–10.5)

## 2024-05-27 LAB — LIPID PANEL
Cholesterol: 237 mg/dL — ABNORMAL HIGH (ref 0–200)
HDL: 38 mg/dL — ABNORMAL LOW (ref >39.00)
LDL Cholesterol: 146 mg/dL — ABNORMAL HIGH (ref 0–99)
NonHDL: 198.66
Total CHOL/HDL Ratio: 6
Triglycerides: 262 mg/dL — ABNORMAL HIGH (ref 0.0–149.0)
VLDL: 52.4 mg/dL — ABNORMAL HIGH (ref 0.0–40.0)

## 2024-05-27 LAB — VITAMIN D 25 HYDROXY (VIT D DEFICIENCY, FRACTURES): VITD: >120 ng/mL

## 2024-05-27 LAB — MAGNESIUM: Magnesium: 2.1 mg/dL (ref 1.5–2.5)

## 2024-05-27 LAB — HEMOGLOBIN A1C: Hgb A1c MFr Bld: 5.7 % (ref 4.6–6.5)

## 2024-05-27 MED ORDER — MIDODRINE HCL 10 MG PO TABS: 10.0000 mg | ORAL_TABLET | Freq: Three times a day (TID) | ORAL | 0 refills | Status: DC

## 2024-05-27 NOTE — Telephone Encounter (Signed)
 Copied from CRM [id number]. Topic: Appointments - Scheduling Inquiry for Clinic >> May 27, 2024  4:50 PM Melissa C wrote: Reason for CRM: patient received call regarding lab test results and need to come back after so many weeks for follow up labs after taking Vitamin D . An appointment was make for lab only for 8/12 during that call however, patient just called back because she has an office visit also scheduled with her doctor for 8/6 and she was wondering if everything can be done during the office visit on 8/6 as this would save her travel time. Caretaker was there with patient as well assisting patient with getting this thought through to me. Please advise with patient as to whether she can just come on 8/6 and have her levels checked then

## 2024-05-27 NOTE — Telephone Encounter (Signed)
 Critical lab for vitamin d  was called in by Saa from [facility]. Patient Vitamin D  levels came back as greater than 120

## 2024-05-27 NOTE — Telephone Encounter (Signed)
 Went to discuss with Dr. Sebastian about the critical lab results. Informed by Dr. Sebastian to have patient stop all Vitamin D  and multi vitamin supplement and to recheck levels in 6 weeks.   Removed the Vitamin D3 5000 units daily from patients medication list

## 2024-05-27 NOTE — Telephone Encounter (Signed)
 Called patient and informed her of Dr. Oswald recommendations of stopping the Vitamin D  and multi vitamin supplements and to return in 6 weeks for a lab only appointment to have levels rechecked. Patient is scheduled for 07/08/24 at 2:00 PM for lab only appointment. Patient verbalized understanding and all (if any) questions were answered.

## 2024-05-27 NOTE — Patient Instructions (Signed)
  VISIT SUMMARY: During today's visit, we discussed your concerns about low blood pressure and facial drainage. We reviewed your current medications and symptoms, including sporadic chest pain, hallucinations, and sleepiness. We also talked about your history of osteoporosis and the importance of regular monitoring of your vitamin D  and calcium  levels.  YOUR PLAN: -NEUROGENIC ORTHOSTATIC HYPOTENSION: This condition is a type of low blood pressure that happens when you stand up from sitting or lying down, often associated with Parkinson's disease. We will increase your midodrine  dosage to 10 mg three times a day to help manage your blood pressure. Additionally, wearing compression stockings and increasing your salt intake to about 2 grams per day can help prevent blood pressure drops. Please Ensure that you are careful when standing.   -SIALORRHEA: This condition involves excessive saliva production, leading to facial drainage and discomfort, especially at night. To help manage this, we recommend sucking on candies to promote swallowing and reduce symptoms.  -HALLUCINATIONS: You are experiencing hallucinations, which are being managed with Seroquel , though it causes sleepiness. We discussed the possibility of switching to less sedating medications if the hallucinations persist, but our current focus is on managing your blood pressure.  -GENERAL HEALTH MAINTENANCE: It's important to monitor your kidney and liver function, vitamin D  and calcium  levels, and screen for diabetes, thyroid  function, and cholesterol. Regular lab work will help ensure your overall health remains stable, especially with your low blood pressure.  INSTRUCTIONS: Please increase your midodrine  dosage to 10 mg three times a day and consider wearing compression stockings and increasing your salt intake. Suck on candies to help manage your facial drainage. We will reassess your blood pressure management and overall health status in one  month, so please schedule a follow-up appointment.

## 2024-05-27 NOTE — Progress Notes (Signed)
 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 Current management includes carbidopa -levodopa  and midodrine . Blood pressure is low at 100/60 mmHg. She prefers non-pharmacological interventions but agreed to increase midodrine  dosage for better control. Discussed increasing midodrine  to 10 mg three times a day to prevent blood pressure drops from sitting to standing. 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 Discussed treatment options including anticholinergics, which may increase the risk of confusion and falls, and botulinum toxin injections for severe cases. Recommended non-pharmacological approach of sucking on candies to promote swallowing and reduce symptoms. 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 Also on sertraline  for anxiety. Discussed potential for less sedating agents if hallucinations persist, but current focus is on managing blood pressure. - Consider alternative less sedating agents if necessary  General Health Maintenance Emphasized the importance of monitoring kidney and liver function, vitamin D  and calcium  levels due to osteoporosis, and screening for diabetes, thyroid  function, and cholesterol. Regular lab work is needed to ensure stability, especially with low blood pressure. - Order lab work to check kidney and liver function, vitamin D  and calcium  levels, blood sugar, blood counts, and screen for diabetes, thyroid  function, and cholesterol  Follow-up Plan to reassess blood pressure management and overall  health status in one month. - Schedule follow-up appointment in one month to evaluate blood pressure and overall health      Medications Discontinued During This Encounter  Medication Reason   midodrine  (PROAMATINE ) 5 MG tablet     Return in about 1 month (around 06/27/2024) for low blood pressure.        Subjective:   Encounter date: 05/27/2024  Alexandra Lindsey is a 83 y.o. female who has Parkinson disease (HCC); RBD (REM behavioral disorder); Hyperlipidemia; Hypertension; GERD (gastroesophageal reflux disease); Abnormal glucose; Vitamin D  deficiency; Medication management; SI (sacroiliac) pain; Chronic pain syndrome; Lumbar radiculopathy; Lumbar stenosis with neurogenic claudication; Positive colorectal cancer screening using Cologuard test; Frequent falls; S/P spinal fusion; Postlaminectomy syndrome, not elsewhere classified; Osteoporosis; Stage 3a chronic kidney disease (HCC); and Constipation on their problem list..   She  has a past medical history of Anemia, Arthritis, Chest pain, unspecified, Constipation, Costochondritis, GERD (gastroesophageal reflux disease), Headache, History of hiatal hernia, Hyperlipidemia (10-07-12), Never smoked tobacco, Parkinson's disease (HCC) (10-07-12), Parkinson's disease (HCC), Prediabetes, RBD (REM behavioral disorder) (02/13/2013), and Spinal stenosis..   She presents with chief complaint of Follow-up (35mo on parkinsons and vitD; doing okay today) and Facial Pain (Left side of face; been going on for years; hurts like pressure and drainage gets worse at night) .   Discussed the use of AI scribe software for clinical note transcription with the patient, who gave verbal consent to proceed.  History of Present Illness Alexandra Lindsey is an 83 year old female with Parkinson's disease who presents with concerns about low blood pressure and facial drainage.  She experiences facial drainage, particularly at night, which sometimes becomes painful. She  has not yet tried any remedies for this symptom.  She has a history of low blood pressure and is currently taking carbidopa -levodopa , 11 pills a day, and midodrine . Her blood pressure was recorded at 100/60  today, and she has noticed some improvement since starting midodrine .  She experiences sporadic chest pain lasting 15 to 20 minutes, relieved by aspirin. She has undergone heart catheterization in the past, which showed no significant findings.  She is on Seroquel  for hallucinations, which makes her sleepy, and sertraline  100 mg at night for anxiety. She reports still experiencing hallucinations despite the medication.  She has a history of osteoporosis and is monitored for vitamin D  and calcium  levels. She does not currently take medication for heartburn as it is well controlled.  No current chest pain or shortness of breath. She reports experiencing hallucinations and sleepiness from her medications.     ROS  Past Surgical History:  Procedure Laterality Date   ABDOMINAL HYSTERECTOMY     APPENDECTOMY     BACK SURGERY  12/20/2015   BACK SURGERY  09/27/2017   BACK SURGERY  2020   BUNIONECTOMY Bilateral 11-11-3   CARDIAC CATHETERIZATION Bilateral 2013   COLONOSCOPY     HERNIA REPAIR     LEFT HEART CATHETERIZATION WITH CORONARY ANGIOGRAM N/A 07/03/2012   Procedure: LEFT HEART CATHETERIZATION WITH CORONARY ANGIOGRAM;  Surgeon: Debby DELENA Sor, MD;  Location: [facility] CATH LAB;  Service: Cardiovascular;  Laterality: N/A;   REPAIR OF CEREBROSPINAL FLUID LEAK N/A 12/24/2015   Procedure: LUMBAR WOUND EXPLORATION, REPAIR OF CEREBROSPINAL FLUID LEAK, PLACEMENT OF LUMBAR DRAIN;  Surgeon: Morene Hicks Ditty, MD;  Location: [facility] NEURO ORS;  Service: Neurosurgery;  Laterality: N/A;   ROTATOR CUFF REPAIR Left 10-07-12    Outpatient Medications Prior to Visit  Medication Sig Dispense Refill   acetaminophen  (TYLENOL ) 500 MG tablet Take 1,000 mg by mouth 3 (three) times daily.     carbidopa -levodopa   (SINEMET  IR) 25-100 MG tablet TAKE 2 AT 5AM/1 AT 7AM/2 AT 9AM/1 AT 11AM/ 2 AT 1PM/ 1 AT 3PM/1 AT 5PM AND PATIENT HAS ADDED 1 AT BEDTIME. 990 tablet 0   Cholecalciferol  (VITAMIN D3) 5000 units CAPS Take 5,000 Units by mouth daily.     QUEtiapine  (SEROQUEL ) 25 MG tablet TAKE 1/2 TABLET TWICE A DAY BY MOUTH 90 tablet 0   sertraline  (ZOLOFT ) 100 MG tablet TAKE 1 AND 1/2 TABLETS BY MOUTH AT NIGHT 135 tablet 9   vitamin B-12 (CYANOCOBALAMIN ) 500 MCG tablet Take 500 mcg by mouth daily.     midodrine  (PROAMATINE ) 5 MG tablet Take 1 tablet (5 mg total) by mouth 3 (three) times daily with meals. 270 tablet 1   No facility-administered medications prior to visit.    Family History  Problem Relation Age of Onset   Heart attack Mother    Esophageal cancer Mother    Suicidality Father        suicide    Suicidality Brother        suicide    CVA Brother    Hypertension Brother    Hypercholesterolemia Brother    Mental retardation Brother     Social History   Socioeconomic History   Marital status: Married    Spouse name: Not on file   Number of children: 0   Years of education: Not on file   Highest education level: Some college, no degree  Occupational History   Occupation: retired    Comment: Advertising account executive  Tobacco Use   Smoking status: Never   Smokeless tobacco: Never  Vaping Use   Vaping status: Never Used  Substance and Sexual Activity   Alcohol use: No    Alcohol/week: 0.0 standard drinks of alcohol   Drug  use: No   Sexual activity: Not on file  Other Topics Concern   Not on file  Social History Narrative   Pt lives in private home with husband no children   R handed   Social Drivers of Corporate investment banker Strain: Not on file  Food Insecurity: No Food Insecurity (11/24/2021)   Hunger Vital Sign    Worried About Running Out of Food in the Last Year: Never true    Ran Out of Food in the Last Year: Never true  Transportation Needs: No Transportation Needs  (11/24/2021)   PRAPARE - Administrator, Civil Service (Medical): No    Lack of Transportation (Non-Medical): No  Physical Activity: Not on file  Stress: Not on file  Social Connections: Not on file  Intimate Partner Violence: Not on file                                                                                                  Objective:  Physical Exam: BP 100/60   Pulse 89   Temp 97.6 F (36.4 C)   Ht 5' (1.524 m)   Wt 130 lb 3.2 oz (59.1 kg)   SpO2 96%   BMI 25.43 kg/m    Physical Exam GENERAL: Alert, cooperative, well developed, no acute distress HEENT: Normocephalic, normal oropharynx, moist mucous membranes CHEST: Clear to auscultation bilaterally, no wheezes, rhonchi, or crackles CARDIOVASCULAR: Normal heart rate and rhythm, S1 and S2 normal without murmurs ABDOMEN: Soft, non-tender, non-distended, without organomegaly, normal bowel sounds EXTREMITIES: No cyanosis or edema NEUROLOGICAL: Cranial nerves grossly intact, moves all extremities without gross motor or sensory deficit   Physical Exam Constitutional:      General: She is not in acute distress.    Appearance: Normal appearance. She is not ill-appearing or toxic-appearing.     Comments: Wheelchair-bound  HENT:     Head: Normocephalic and atraumatic.     Nose: Nose normal. No congestion.   Eyes:     General: No scleral icterus.    Extraocular Movements: Extraocular movements intact.    Cardiovascular:     Rate and Rhythm: Normal rate and regular rhythm.     Pulses: Normal pulses.     Heart sounds: Normal heart sounds.  Pulmonary:     Effort: Pulmonary effort is normal. No respiratory distress.     Breath sounds: Normal breath sounds.  Abdominal:     General: Abdomen is flat. Bowel sounds are normal.     Palpations: Abdomen is soft.   Musculoskeletal:        General: Normal range of motion.     Cervical back: Torticollis (To the left) present.  Lymphadenopathy:     Cervical:  No cervical adenopathy.   Skin:    General: Skin is warm and dry.     Findings: No rash.   Neurological:     General: No focal deficit present.     Mental Status: She is alert and oriented to person, place, and time. Mental status is at baseline.     Motor: Tremor present.  Psychiatric:        Attention and Perception: She perceives auditory and visual hallucinations.        Mood and Affect: Mood is anxious.        Speech: Speech is delayed.        Behavior: Behavior is actively hallucinating. Behavior is cooperative.        Cognition and Memory: Cognition is impaired.     No results found.  No results found for this or any previous visit (from the past 2160 hours).      Beverley Adine Hummer, MD, MS

## 2024-05-28 NOTE — Addendum Note (Signed)
 Addended by: LENON ROUGHEN on: 05/28/2024 09:32 AM   Modules accepted: Orders

## 2024-05-28 NOTE — Telephone Encounter (Signed)
 Copied from CRM [id number]. Topic: General - Other >> May 28, 2024 10:31 AM Robinson H wrote: Reason for CRM: Patient states she's calling to speak with [facility] regarding appointment information, states she doesn't need to change appointments will keep appointments.for 8/6 and 8/12 If office needs to reach out they can.  Lauree [phone]

## 2024-05-28 NOTE — Telephone Encounter (Addendum)
 Vitamin D  order placed. Patient called back and wanted to know since she is seeing you on 07/02/24 can she cancel the 07/08/24 lab only appointment?

## 2024-05-29 ENCOUNTER — Ambulatory Visit: Payer: Self-pay | Admitting: Family Medicine

## 2024-05-29 LAB — URINALYSIS W MICROSCOPIC + REFLEX CULTURE
Bilirubin Urine: NEGATIVE
Glucose, UA: NEGATIVE
Hgb urine dipstick: NEGATIVE
Nitrites, Initial: POSITIVE — AB
RBC / HPF: NONE SEEN /HPF (ref 0–2)
Specific Gravity, Urine: >=1.045 — AB (ref 1.001–1.035)
pH: <=5 — AB (ref 5.0–8.0)

## 2024-05-29 LAB — COMPLETE METABOLIC PANEL WITHOUT GFR
AG Ratio: 2.2 (calc) (ref 1.0–2.5)
ALT: 4 U/L — ABNORMAL LOW (ref 6–29)
AST: 12 U/L (ref 10–35)
Albumin: 4.4 g/dL (ref 3.6–5.1)
Alkaline phosphatase (APISO): 88 U/L (ref 37–153)
BUN/Creatinine Ratio: 26 (calc) — ABNORMAL HIGH (ref 6–22)
BUN: 27 mg/dL — ABNORMAL HIGH (ref 7–25)
CO2: 24 mmol/L (ref 20–32)
Calcium: 9.7 mg/dL (ref 8.6–10.4)
Chloride: 108 mmol/L (ref 98–110)
Creat: 1.03 mg/dL — ABNORMAL HIGH (ref 0.60–0.95)
Globulin: 2 g/dL (ref 1.9–3.7)
Glucose, Bld: 93 mg/dL (ref 65–99)
Potassium: 4.4 mmol/L (ref 3.5–5.3)
Sodium: 141 mmol/L (ref 135–146)
Total Bilirubin: 0.4 mg/dL (ref 0.2–1.2)
Total Protein: 6.4 g/dL (ref 6.1–8.1)

## 2024-05-29 LAB — URINE CULTURE
MICRO NUMBER:: 16651488
SPECIMEN QUALITY:: ADEQUATE

## 2024-05-29 LAB — CULTURE INDICATED

## 2024-05-29 LAB — INSULIN, RANDOM: Insulin: 9.7 u[IU]/mL

## 2024-06-14 ENCOUNTER — Other Ambulatory Visit: Payer: Self-pay | Admitting: Neurology

## 2024-06-14 DIAGNOSIS — G20A1 Parkinson's disease without dyskinesia, without mention of fluctuations: Secondary | ICD-10-CM

## 2024-06-19 ENCOUNTER — Other Ambulatory Visit: Payer: Self-pay | Admitting: Family Medicine

## 2024-06-19 DIAGNOSIS — I950 Idiopathic hypotension: Secondary | ICD-10-CM

## 2024-07-02 ENCOUNTER — Ambulatory Visit: Admitting: Family Medicine

## 2024-07-05 ENCOUNTER — Other Ambulatory Visit: Payer: Self-pay | Admitting: Nurse Practitioner

## 2024-07-05 DIAGNOSIS — F32A Depression, unspecified: Secondary | ICD-10-CM

## 2024-07-08 ENCOUNTER — Other Ambulatory Visit

## 2024-07-17 ENCOUNTER — Ambulatory Visit: Admitting: Family Medicine

## 2024-07-21 ENCOUNTER — Encounter: Payer: Medicare Other | Admitting: Internal Medicine

## 2024-07-31 ENCOUNTER — Ambulatory Visit: Admitting: Family Medicine

## 2024-07-31 ENCOUNTER — Encounter: Payer: Self-pay | Admitting: Family Medicine

## 2024-07-31 VITALS — BP 137/72 | HR 81 | Temp 97.5°F | Ht 60.0 in | Wt 131.6 lb

## 2024-07-31 DIAGNOSIS — Z23 Encounter for immunization: Secondary | ICD-10-CM | POA: Diagnosis not present

## 2024-07-31 DIAGNOSIS — G894 Chronic pain syndrome: Secondary | ICD-10-CM

## 2024-07-31 DIAGNOSIS — L304 Erythema intertrigo: Secondary | ICD-10-CM | POA: Diagnosis not present

## 2024-07-31 DIAGNOSIS — I950 Idiopathic hypotension: Secondary | ICD-10-CM | POA: Diagnosis not present

## 2024-07-31 MED ORDER — NYSTATIN 100000 UNIT/GM EX CREA: 1.0000 | TOPICAL_CREAM | Freq: Four times a day (QID) | CUTANEOUS | 1 refills | Status: AC

## 2024-07-31 MED ORDER — CELECOXIB 100 MG PO CAPS: 100.0000 mg | ORAL_CAPSULE | Freq: Two times a day (BID) | ORAL | 0 refills | Status: DC

## 2024-07-31 NOTE — Progress Notes (Signed)
 Assessment & Plan   Assessment/Plan:     Assessment & Plan Intertrigo with secondary yeast infection, inframammary Chronic rash under both breasts for about two years, characterized by intermittent itching and irritation. Differential diagnosis ruled out shingles as it affects only one side. Diagnosis of intertrigo with secondary yeast infection confirmed. - Prescribe nystatin  ointment, apply twice daily for 14 days. - Advise keeping the area dry using powder. - Consider using a barrier cream like Desitin if needed. - Avoid hot and sweaty environments.  Chronic right shoulder pain Chronic right shoulder pain since back surgery in January 2017. Pain is intermittent and managed with daily Tylenol . No history of shoulder surgery. Discussed potential use of anti-inflammatory medication, considering past irritation with diclofenac  ointment. Celecoxib  preferred due to fewer GI side effects compared to other NSAIDs. - Continue Tylenol  as needed for pain. - Prescribe celecoxib  100 mg, up to twice daily as needed for breakthrough pain. - Advise against using opioids for pain management. - Discuss insurance coverage for celecoxib  and consider alternatives if cost is prohibitive.  Hypertension Blood pressure readings at home have been good, though not recorded. Previous low blood pressure noted during hot weather, now improved. - Continue current management of hypertension.  General Health Maintenance Discussed shingles vaccination and confirmed rash is not shingles, allowing for vaccination. Discussed the nature of shingles and the benefits of vaccination despite potential side effects. Shingles vaccine recommended due to potential severity of shingles reactivation. - Administer shingles vaccine. - Administer flu shot.      There are no discontinued medications.  Return if symptoms worsen or fail to improve.        Subjective:   Encounter date: 07/31/2024  Alexandra Lindsey is a  83 y.o. female who has Parkinson disease (HCC); RBD (REM behavioral disorder); Hyperlipidemia; Hypertension; GERD (gastroesophageal reflux disease); Abnormal glucose; Vitamin D  deficiency; Medication management; SI (sacroiliac) pain; Chronic pain syndrome; Lumbar radiculopathy; Lumbar stenosis with neurogenic claudication; Positive colorectal cancer screening using Cologuard test; Frequent falls; S/P spinal fusion; Postlaminectomy syndrome, not elsewhere classified; Osteoporosis; Stage 3a chronic kidney disease (HCC); Constipation; Intertrigo; and Idiopathic hypotension on their problem list..   She  has a past medical history of Anemia, Arthritis, Chest pain, unspecified, Constipation, Costochondritis, GERD (gastroesophageal reflux disease), Headache, History of hiatal hernia, Hyperlipidemia (10-07-12), Never smoked tobacco, Parkinson's disease (HCC) (10-07-12), Parkinson's disease (HCC), Prediabetes, RBD (REM behavioral disorder) (02/13/2013), and Spinal stenosis..   She presents with chief complaint of Labile hypertension (Follow up 1 month for  blood pressure readings at home have been ok/Rash under breast both, immunization due/) .   Discussed the use of AI scribe software for clinical note transcription with the patient, who gave verbal consent to proceed.  History of Present Illness Alexandra Lindsey is an 83 year old female with hypertension who presents for follow-up and evaluation of a rash under her breasts.  She has had a rash under both breasts for about two years. The rash is intermittent, not painful, but causes slight itching. She uses a powder similar to cornstarch to keep the area dry, which her husband applies. She has not applied any powder today as she was unsure of what to use. No pain or drainage from the rash.  She is here for follow-up on her hypertension. Her blood pressure readings at home have been good, although she does not have a record of the specific numbers.  She  experiences pain in her right shoulder blade, which has been ongoing since  she had back surgery in January 2017 for a cyst on her spine. The pain is intermittent, and she uses Blue MU on her back and shoulders daily, which provides some relief. She takes Tylenol  three times a day, two tablets with each meal, which helps manage the pain. She has not taken ibuprofen  due to concerns about allergies, but she has used it in the past without issues.  She wants to receive the shingles vaccine and the flu shot. She has a history of chickenpox from when she was very young. She mentions feeling sleepy, which she attributes to her Parkinson's medication.     ROS  Past Surgical History:  Procedure Laterality Date   ABDOMINAL HYSTERECTOMY     APPENDECTOMY     BACK SURGERY  12/20/2015   BACK SURGERY  09/27/2017   BACK SURGERY  2020   BUNIONECTOMY Bilateral 11-11-3   CARDIAC CATHETERIZATION Bilateral 2013   COLONOSCOPY     HERNIA REPAIR     LEFT HEART CATHETERIZATION WITH CORONARY ANGIOGRAM N/A 07/03/2012   Procedure: LEFT HEART CATHETERIZATION WITH CORONARY ANGIOGRAM;  Surgeon: Debby DELENA Sor, MD;  Location: [facility] CATH LAB;  Service: Cardiovascular;  Laterality: N/A;   REPAIR OF CEREBROSPINAL FLUID LEAK N/A 12/24/2015   Procedure: LUMBAR WOUND EXPLORATION, REPAIR OF CEREBROSPINAL FLUID LEAK, PLACEMENT OF LUMBAR DRAIN;  Surgeon: Morene Hicks Ditty, MD;  Location: [facility]UR[facility];  Service: Neurosurgery;  Laterality: N/A;   ROTATOR CUFF REPAIR Left 10-07-12    Outpatient Medications Prior to Visit  Medication Sig Dispense Refill   acetaminophen  (TYLENOL ) 500 MG tablet Take 1,000 mg by mouth 3 (three) times daily.     carbidopa -levodopa  (SINEMET  IR) 25-100 MG tablet TAKE 2 AT 5AM/1 AT 7AM/2 AT 9AM/1 AT 11AM/ 2 AT 1PM/ 1 AT 3PM/1 AT 5PM AND PATIENT HAS ADDED 1 AT BEDTIME. 990 tablet 0   midodrine  (PROAMATINE ) 10 MG tablet TAKE 1 TABLET BY MOUTH 3 TIMES DAILY WITH MEALS. 270 tablet 1   QUEtiapine  (SEROQUEL ) 25  MG tablet TAKE 1/2 TABLET TWICE A DAY BY MOUTH 90 tablet 0   sertraline  (ZOLOFT ) 100 MG tablet TAKE 1 AND 1/2 TABLETS BY MOUTH AT NIGHT 135 tablet 9   vitamin B-12 (CYANOCOBALAMIN ) 500 MCG tablet Take 500 mcg by mouth daily.     No facility-administered medications prior to visit.    Family History  Problem Relation Age of Onset   Heart attack Mother    Esophageal cancer Mother    Suicidality Father        suicide    Suicidality Brother        suicide    CVA Brother    Hypertension Brother    Hypercholesterolemia Brother    Mental retardation Brother     Social History   Socioeconomic History   Marital status: Married    Spouse name: Not on file   Number of children: 0   Years of education: Not on file   Highest education level: Some college, no degree  Occupational History   Occupation: retired    Comment: Advertising account executive  Tobacco Use   Smoking status: Never   Smokeless tobacco: Never  Vaping Use   Vaping status: Never Used  Substance and Sexual Activity   Alcohol use: No    Alcohol/week: 0.0 standard drinks of alcohol   Drug use: No   Sexual activity: Not on file  Other Topics Concern   Not on file  Social History Narrative   Pt lives in  private home with husband no children   R handed   Social Drivers of Corporate investment banker Strain: Not on file  Food Insecurity: No Food Insecurity (11/24/2021)   Hunger Vital Sign    Worried About Running Out of Food in the Last Year: Never true    Ran Out of Food in the Last Year: Never true  Transportation Needs: No Transportation Needs (11/24/2021)   PRAPARE - Administrator, Civil Service (Medical): No    Lack of Transportation (Non-Medical): No  Physical Activity: Not on file  Stress: Not on file  Social Connections: Not on file  Intimate Partner Violence: Not on file                                                                                                  Objective:  Physical  Exam: BP 137/72   Pulse 81   Temp (!) 97.5 F (36.4 C) (Temporal)   Ht 5' (1.524 m)   Wt 131 lb 9.6 oz (59.7 kg)   SpO2 97%   BMI 25.70 kg/m   Chaperone Lauren McLween  Physical Exam GENERAL: Alert, cooperative, well developed, no acute distress HEENT: Normocephalic, normal oropharynx, moist mucous membranes CHEST: Clear to auscultation bilaterally, no wheezes, rhonchi, or crackles CARDIOVASCULAR: Normal heart rate and rhythm, S1 and S2 normal without murmurs BREAST: Rash under both breasts, likely intertrigo, not shingles. ABDOMEN: Soft, non-tender, non-distended, without organomegaly, normal bowel sounds EXTREMITIES: No cyanosis or edema MUSCULOSKELETAL: Right shoulder examined, no specific abnormalities. NEUROLOGICAL: Cranial nerves grossly intact, moves all extremities without gross motor or sensory deficit .ortho  Physical Exam  No results found.  Recent Results (from the past 2160 hours)  VITAMIN D  25 Hydroxy (Vit-D Deficiency, Fractures)     Status: None   Collection Time: 05/27/24  2:05 PM  Result Value Ref Range   VITD >120 ng/mL  Insulin , random     Status: None   Collection Time: 05/27/24  2:05 PM  Result Value Ref Range   Insulin  9.7 uIU/mL    Comment:       Reference Range  < or = 18.4 .       Risk:       Optimal          < or = 18.4       Moderate         NA       High             >18.4 .       Adult cardiovascular event risk category       cut points (optimal, moderate, high)       are based on Insulin  Reference Interval       studies performed at [facility]       in 2022. SABRA   Hemoglobin A1c     Status: None   Collection Time: 05/27/24  2:05 PM  Result Value Ref Range   Hgb A1c MFr Bld 5.7 4.6 - 6.5 %    Comment: Glycemic Control Guidelines for People  with Diabetes:Non Diabetic:  <6%Goal of Therapy: <7%Additional Action Suggested:  >8%   TSH     Status: None   Collection Time: 05/27/24  2:05 PM  Result Value Ref Range   TSH 1.19 0.35 -  5.50 uIU/mL  Lipid panel     Status: Abnormal   Collection Time: 05/27/24  2:05 PM  Result Value Ref Range   Cholesterol 237 (H) 0 - 200 mg/dL    Comment: ATP III Classification       Desirable:  < 200 mg/dL               Borderline High:  200 - 239 mg/dL          High:  > = 759 mg/dL   Triglycerides 737.9 (H) 0.0 - 149.0 mg/dL    Comment: Normal:  <849 mg/dLBorderline High:  150 - 199 mg/dL   HDL 61.99 (L) >60.99 mg/dL   VLDL 47.5 (H) 0.0 - 59.9 mg/dL   LDL Cholesterol 853 (H) 0 - 99 mg/dL   Total CHOL/HDL Ratio 6     Comment:                Men          Women1/2 Average Risk     3.4          3.3Average Risk          5.0          4.42X Average Risk          9.6          7.13X Average Risk          15.0          11.0                       NonHDL 198.66     Comment: NOTE:  Non-HDL goal should be 30 mg/dL higher than patient's LDL goal (i.e. LDL goal of < 70 mg/dL, would have non-HDL goal of < 100 mg/dL)  Magnesium      Status: None   Collection Time: 05/27/24  2:05 PM  Result Value Ref Range   Magnesium  2.1 1.5 - 2.5 mg/dL  COMPLETE METABOLIC PANEL WITHOUT GFR     Status: Abnormal   Collection Time: 05/27/24  2:05 PM  Result Value Ref Range   Glucose, Bld 93 65 - 99 mg/dL    Comment: .            Fasting reference interval .    BUN 27 (H) 7 - 25 mg/dL   Creat 8.96 (H) 9.39 - 0.95 mg/dL   BUN/Creatinine Ratio 26 (H) 6 - 22 (calc)   Sodium 141 135 - 146 mmol/L   Potassium 4.4 3.5 - 5.3 mmol/L   Chloride 108 98 - 110 mmol/L   CO2 24 20 - 32 mmol/L   Calcium  9.7 8.6 - 10.4 mg/dL   Total Protein 6.4 6.1 - 8.1 g/dL   Albumin  4.4 3.6 - 5.1 g/dL   Globulin 2.0 1.9 - 3.7 g/dL (calc)   AG Ratio 2.2 1.0 - 2.5 (calc)   Total Bilirubin 0.4 0.2 - 1.2 mg/dL   Alkaline phosphatase (APISO) 88 37 - 153 U/L   AST 12 10 - 35 U/L   ALT 4 (L) 6 - 29 U/L  CBC with Differential/Platelet     Status: Abnormal   Collection Time: 05/27/24  2:05 PM  Result Value Ref Range  WBC 4.7 4.0 - 10.5 K/uL    RBC 3.87 3.87 - 5.11 Mil/uL   Hemoglobin 11.9 (L) 12.0 - 15.0 g/dL   HCT 64.0 (L) 63.9 - 53.9 %   MCV 92.8 78.0 - 100.0 fl   MCHC 33.2 30.0 - 36.0 g/dL   RDW 87.0 88.4 - 84.4 %   Platelets 229.0 150.0 - 400.0 K/uL   Neutrophils Relative % 68.1 43.0 - 77.0 %   Lymphocytes Relative 20.0 12.0 - 46.0 %   Monocytes Relative 9.8 3.0 - 12.0 %   Eosinophils Relative 1.3 0.0 - 5.0 %   Basophils Relative 0.8 0.0 - 3.0 %   Neutro Abs 3.2 1.4 - 7.7 K/uL   Lymphs Abs 0.9 0.7 - 4.0 K/uL   Monocytes Absolute 0.5 0.1 - 1.0 K/uL   Eosinophils Absolute 0.1 0.0 - 0.7 K/uL   Basophils Absolute 0.0 0.0 - 0.1 K/uL  Microalbumin / creatinine urine ratio     Status: Abnormal   Collection Time: 05/27/24  2:05 PM  Result Value Ref Range   Microalb, Ur 6.9 (H) 0.0 - 1.9 mg/dL   Creatinine,U 659.9 mg/dL   Microalb Creat Ratio 20.3 0.0 - 30.0 mg/g  Urinalysis w microscopic + reflex cultur     Status: Abnormal   Collection Time: 05/27/24  2:05 PM   Specimen: Blood  Result Value Ref Range   Color, Urine DARK YELLOW YELLOW   APPearance CLOUDY (A) CLEAR   Specific Gravity, Urine > OR = 1.045 (A) 1.001 - 1.035   pH < OR = 5.0 (A) 5.0 - 8.0   Glucose, UA NEGATIVE NEGATIVE   Bilirubin Urine NEGATIVE NEGATIVE   Ketones, ur 1+ (A) NEGATIVE   Hgb urine dipstick NEGATIVE NEGATIVE   Protein, ur 1+ (A) NEGATIVE   Nitrites, Initial POSITIVE (A) NEGATIVE   Leukocyte Esterase 1+ (A) NEGATIVE   WBC, UA 20-40 (A) 0 - 5 /HPF   RBC / HPF NONE SEEN 0 - 2 /HPF   Squamous Epithelial / HPF 0-5 < OR = 5 /HPF   Bacteria, UA FEW (A) NONE SEEN /HPF   Calcium  Oxalate Crystal FEW NONE OR FEW /HPF   Hyaline Cast 0-5 (A) NONE SEEN /LPF   Note      Comment: This urine was analyzed for the presence of WBC,  RBC, bacteria, casts, and other formed elements.  Only those elements seen were reported. . .   Urine Culture     Status: None   Collection Time: 05/27/24  2:05 PM  Result Value Ref Range   MICRO NUMBER: [id number]     SPECIMEN QUALITY: Adequate    Sample Source URINE    STATUS: FINAL    Result:      Less than 10,000 CFU/mL of single Gram negative organism isolated. No further testing will be performed. If clinically indicated, recollection using a method to minimize contamination, with prompt transfer to Urine Culture Transport Tube, is recommended.  REFLEXIVE URINE CULTURE     Status: None   Collection Time: 05/27/24  2:05 PM  Result Value Ref Range   REFLEXIVE URINE CULTURE      Comment: CULTURE INDICATED - RESULTS TO FOLLOW        Beverley Adine Hummer, MD, MS

## 2024-07-31 NOTE — Patient Instructions (Addendum)
  VISIT SUMMARY: Today, you came in for a follow-up visit to discuss a rash under your breasts, your hypertension, and chronic right shoulder pain. We also talked about vaccinations for shingles and the flu.  YOUR PLAN: -INTERTRIGO WITH SECONDARY YEAST INFECTION: Intertrigo is a rash that occurs in skin folds, often worsened by moisture and friction, and can lead to a yeast infection. You should apply nystatin  ointment twice daily for 14 days, keep the area dry using powder, and consider using a barrier cream like Desitin if needed. Avoid hot and sweaty environments to prevent further irritation.  -CHRONIC RIGHT SHOULDER PAIN: Chronic pain in your right shoulder has been ongoing since your back surgery in January 2017. Continue taking Tylenol  as needed for pain relief. I have prescribed celecoxib  100 mg, which you can take up to twice daily for breakthrough pain. Avoid using opioids for pain management. We will discuss insurance coverage for celecoxib  and consider alternatives if the cost is too high.  -HYPERTENSION: Your blood pressure readings at home have been good. Continue with your current management plan for hypertension.  -GENERAL HEALTH MAINTENANCE: We discussed and administered the shingles vaccine and the flu shot today. The shingles vaccine is recommended to prevent the reactivation of the chickenpox virus, which can cause a painful rash. The flu shot helps protect against the influenza virus.  INSTRUCTIONS: Please follow up with us  if you experience any side effects from the medications or vaccines, or if your symptoms worsen. Continue monitoring your blood pressure at home and keep a record of your readings. If you have any questions or concerns, do not hesitate to contact our office.

## 2024-08-05 DIAGNOSIS — I950 Idiopathic hypotension: Secondary | ICD-10-CM | POA: Insufficient documentation

## 2024-08-05 DIAGNOSIS — L304 Erythema intertrigo: Secondary | ICD-10-CM | POA: Insufficient documentation

## 2024-08-08 ENCOUNTER — Other Ambulatory Visit: Payer: Self-pay | Admitting: Neurology

## 2024-08-13 ENCOUNTER — Encounter: Payer: Self-pay | Admitting: Neurology

## 2024-08-14 ENCOUNTER — Ambulatory Visit: Payer: Medicare Other | Admitting: Nurse Practitioner

## 2024-09-04 ENCOUNTER — Other Ambulatory Visit: Payer: Self-pay | Admitting: Family Medicine

## 2024-09-04 DIAGNOSIS — G894 Chronic pain syndrome: Secondary | ICD-10-CM

## 2024-09-16 DIAGNOSIS — Z880 Allergy status to penicillin: Secondary | ICD-10-CM | POA: Diagnosis not present

## 2024-09-16 DIAGNOSIS — Z888 Allergy status to other drugs, medicaments and biological substances status: Secondary | ICD-10-CM | POA: Diagnosis not present

## 2024-09-16 DIAGNOSIS — Z881 Allergy status to other antibiotic agents status: Secondary | ICD-10-CM | POA: Diagnosis not present

## 2024-09-16 DIAGNOSIS — Z885 Allergy status to narcotic agent status: Secondary | ICD-10-CM | POA: Diagnosis not present

## 2024-09-16 DIAGNOSIS — E878 Other disorders of electrolyte and fluid balance, not elsewhere classified: Secondary | ICD-10-CM | POA: Diagnosis not present

## 2024-09-16 DIAGNOSIS — S0990XA Unspecified injury of head, initial encounter: Secondary | ICD-10-CM | POA: Diagnosis not present

## 2024-09-16 DIAGNOSIS — G20A1 Parkinson's disease without dyskinesia, without mention of fluctuations: Secondary | ICD-10-CM | POA: Diagnosis not present

## 2024-09-16 DIAGNOSIS — W19XXXA Unspecified fall, initial encounter: Secondary | ICD-10-CM | POA: Diagnosis not present

## 2024-09-16 DIAGNOSIS — M542 Cervicalgia: Secondary | ICD-10-CM | POA: Diagnosis not present

## 2024-09-16 DIAGNOSIS — M545 Low back pain, unspecified: Secondary | ICD-10-CM | POA: Diagnosis not present

## 2024-09-16 DIAGNOSIS — S0003XA Contusion of scalp, initial encounter: Secondary | ICD-10-CM | POA: Diagnosis not present

## 2024-09-16 DIAGNOSIS — I959 Hypotension, unspecified: Secondary | ICD-10-CM | POA: Diagnosis not present

## 2024-09-17 DIAGNOSIS — Y92009 Unspecified place in unspecified non-institutional (private) residence as the place of occurrence of the external cause: Secondary | ICD-10-CM | POA: Diagnosis not present

## 2024-09-17 DIAGNOSIS — W19XXXA Unspecified fall, initial encounter: Secondary | ICD-10-CM | POA: Diagnosis not present

## 2024-09-17 DIAGNOSIS — G20A1 Parkinson's disease without dyskinesia, without mention of fluctuations: Secondary | ICD-10-CM | POA: Diagnosis not present

## 2024-10-02 ENCOUNTER — Telehealth: Payer: Self-pay

## 2024-10-02 NOTE — Telephone Encounter (Signed)
 Copied from CRM #[id number]. Topic: Clinical - Home Health Verbal Orders >> Oct 02, 2024  1:52 PM Burnard DEL wrote: Caller/Agency: Larraine from [facility] called to report that patient had a fall on November 4,2025. Patient got up on her own with any assistance. Hit back of head. Patient stated that it is sore to the touch. There is no bruising. Patient and husband refused hospital or medical attention. Stated that she falls all the time. OT just wanted to report fall.

## 2024-10-02 NOTE — Telephone Encounter (Signed)
 Can we schedule Pt a OV please. Pt went to the ED on 09/16/2024 due to fall.

## 2024-10-03 NOTE — Telephone Encounter (Signed)
 Noted

## 2024-10-10 ENCOUNTER — Encounter: Payer: Self-pay | Admitting: Family Medicine

## 2024-10-10 ENCOUNTER — Ambulatory Visit: Admitting: Family Medicine

## 2024-10-10 VITALS — BP 118/74 | HR 93 | Temp 97.5°F | Ht 60.0 in | Wt 126.4 lb

## 2024-10-10 DIAGNOSIS — G20B2 Parkinson's disease with dyskinesia, with fluctuations: Secondary | ICD-10-CM

## 2024-10-10 DIAGNOSIS — Z23 Encounter for immunization: Secondary | ICD-10-CM

## 2024-10-10 DIAGNOSIS — D638 Anemia in other chronic diseases classified elsewhere: Secondary | ICD-10-CM | POA: Diagnosis not present

## 2024-10-10 DIAGNOSIS — R296 Repeated falls: Secondary | ICD-10-CM

## 2024-10-10 NOTE — Progress Notes (Signed)
 Assessment & Plan   Assessment/Plan:   Assessment and Plan Assessment & Plan Encounter for immunization Due for flu vaccination. - Administered flu shot.  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 Recent fall on October 21st 2025 resulted in a scalp hematoma, but CT scan showed no skull fractures. No current head pain. No symptoms of UTI. Husband is caretaker and reports she is back to baseline and doing well. - Ensure safety measures are in place to prevent falls. - Monitor for any recurrence of symptoms.  Anemia Previous anemia with current stable with hemoglobin around 11  Chronic shoulder and upper back pain Chronic pain in shoulders and upper back persisting for about two years. No acute changes reported.         There are no discontinued medications.  Return if symptoms worsen or fail to improve.        Subjective:   Encounter date: 10/10/2024  ACELYNN DEJONGE is a 83 y.o. female who has Parkinson disease (HCC); RBD (REM behavioral disorder); Hyperlipidemia; Hypertension; GERD (gastroesophageal reflux disease); Abnormal glucose; Vitamin D  deficiency; Medication management; SI (sacroiliac) pain; Chronic pain syndrome; Lumbar radiculopathy; Lumbar stenosis with neurogenic claudication; Positive colorectal cancer screening using Cologuard test; Frequent falls; S/P spinal fusion; Postlaminectomy syndrome, not elsewhere classified; Osteoporosis; Stage 3a chronic kidney disease (HCC); Constipation; Intertrigo; Idiopathic hypotension; and Anemia in other chronic diseases classified elsewhere on their problem list..   She  has a past medical history of Anemia, Arthritis, Chest pain, unspecified, Constipation, Costochondritis, GERD (gastroesophageal reflux disease), Headache, History of hiatal hernia, Hyperlipidemia (10-07-12), Never smoked  tobacco, Parkinson's disease (HCC) (10-07-12), Parkinson's disease (HCC), Prediabetes, RBD (REM behavioral disorder) (02/13/2013), and Spinal stenosis..   She presents with chief complaint of Hospitalization Follow-up [facility] f/u for fall on 09/16/24.  No injuries.     Flu shot today. ) .  Discussed the use of AI scribe software for clinical note transcription with the patient, who gave verbal consent to proceed.  History of Present Illness JAILEY BOOTON is an 83 year old female with Parkinson's disease who presents with frequent falls and head trauma. She is accompanied by her husband and caretaker, Tressia Gage.  Falls and head trauma - Over twenty-two falls resulting in multiple episodes of head trauma - Most recent fall occurred on September 16, 2024 - Severe pain localized to the occipital region following the most recent fall - No skin breakdown at the site of injury - Persistent swelling of the head without associated pain - CT scan of the head showed no acute intracranial injury - Spine imaging was normal [facility] admission for observation after the most recent fall, subsequently discharged  Musculoskeletal pain - Persistent pain in the shoulders and upper back for approximately two years - No current fever or chills   Gait instability and neurological symptoms - Advanced Parkinson's disease with unsteady gait - Frequent falls attributed to Parkinson's disease - 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 Poor hystorian  Other symptoms and findings - No symptoms of urinary tract infection  ROS  Past Surgical History:  Procedure Laterality Date   ABDOMINAL HYSTERECTOMY     APPENDECTOMY     BACK SURGERY  12/20/2015   BACK SURGERY  09/27/2017   BACK SURGERY  2020   BUNIONECTOMY Bilateral 11-11-3   CARDIAC CATHETERIZATION Bilateral 2013   COLONOSCOPY     HERNIA REPAIR     LEFT  HEART CATHETERIZATION WITH CORONARY ANGIOGRAM N/A 07/03/2012   Procedure: LEFT HEART  CATHETERIZATION WITH CORONARY ANGIOGRAM;  Surgeon: Debby DELENA Sor, MD;  Location: [facility] CATH LAB;  Service: Cardiovascular;  Laterality: N/A;   REPAIR OF CEREBROSPINAL FLUID LEAK N/A 12/24/2015   Procedure: LUMBAR WOUND EXPLORATION, REPAIR OF CEREBROSPINAL FLUID LEAK, PLACEMENT OF LUMBAR DRAIN;  Surgeon: Morene Hicks Ditty, MD;  Location: [facility] NEURO ORS;  Service: Neurosurgery;  Laterality: N/A;   ROTATOR CUFF REPAIR Left 10-07-12    Current Outpatient Medications on File Prior to Visit  Medication Sig Dispense Refill   acetaminophen  (TYLENOL ) 500 MG tablet Take 1,000 mg by mouth 3 (three) times daily.     carbidopa -levodopa  (SINEMET  IR) 25-100 MG tablet TAKE 2 AT 5AM/1 AT 7AM/2 AT 9AM/1 AT 11AM/ 2 AT 1PM/ 1 AT 3PM/1 AT 5PM AND PATIENT HAS ADDED 1 AT BEDTIME. 990 tablet 0   celecoxib  (CELEBREX ) 100 MG capsule TAKE 1 CAPSULE BY MOUTH TWICE A DAY 60 capsule 0   midodrine  (PROAMATINE ) 10 MG tablet TAKE 1 TABLET BY MOUTH 3 TIMES DAILY WITH MEALS. 270 tablet 1   QUEtiapine  (SEROQUEL ) 25 MG tablet TAKE 1/2 TABLET TWICE A DAY BY MOUTH 90 tablet 0   sertraline  (ZOLOFT ) 100 MG tablet TAKE 1 AND 1/2 TABLETS BY MOUTH AT NIGHT 135 tablet 9   vitamin B-12 (CYANOCOBALAMIN ) 500 MCG tablet Take 500 mcg by mouth daily.     No current facility-administered medications on file prior to visit.    Family History  Problem Relation Age of Onset   Heart attack Mother    Esophageal cancer Mother    Suicidality Father        suicide    Suicidality Brother        suicide    CVA Brother    Hypertension Brother    Hypercholesterolemia Brother    Mental retardation Brother     Social History   Socioeconomic History   Marital status: Married    Spouse name: Not on file   Number of children: 0   Years of education: Not on file   Highest education level: Some college, no degree  Occupational History   Occupation: retired    Comment: advertising account executive  Tobacco Use   Smoking status: Never   Smokeless tobacco:  Never  Vaping Use   Vaping status: Never Used  Substance and Sexual Activity   Alcohol use: No    Alcohol/week: 0.0 standard drinks of alcohol   Drug use: No   Sexual activity: Yes  Other Topics Concern   Not on file  Social History Narrative   Pt lives in private home with husband no children   R handed   Social Drivers of Corporate Investment Banker Strain: Not on file  Food Insecurity: Low Risk  (09/16/2024)   Received from [facility]   Hunger Vital Sign    Within the past 12 months, you worried that your food would run out before you got money to buy more: Never true    Within the past 12 months, the food you bought just didn't last and you didn't have money to get more. : Never true  Transportation Needs: No Transportation Needs (09/16/2024)   Received from [facility]    In the past 12 months, has lack of reliable transportation kept you from medical appointments, meetings, work or from getting things needed for daily living? : No  Physical Activity: Not on file  Stress: Not  on file  Social Connections: Not on file  Intimate Partner Violence: Not on file                                                                                                  Objective:  Physical Exam: BP 118/74   Pulse 93   Temp (!) 97.5 F (36.4 C) (Temporal)   Ht 5' (1.524 m)   Wt 126 lb 6.4 oz (57.3 kg)   SpO2 97%   BMI 24.69 kg/m    Physical Exam           Physical Exam Constitutional:      General: She is not in acute distress.    Appearance: Normal appearance. She is not ill-appearing or toxic-appearing.     Comments: Wheelchair-bound  HENT:     Head: Normocephalic and atraumatic.     Nose: Nose normal. No congestion.  Eyes:     General: No scleral icterus.    Extraocular Movements: Extraocular movements intact.  Cardiovascular:     Rate and Rhythm: Normal rate and regular rhythm.     Pulses: Normal pulses.     Heart sounds: Normal heart  sounds.  Pulmonary:     Effort: Pulmonary effort is normal. No respiratory distress.     Breath sounds: Normal breath sounds.  Abdominal:     General: Abdomen is flat. Bowel sounds are normal.     Palpations: Abdomen is soft.  Musculoskeletal:        General: Normal range of motion.     Cervical back: Torticollis (To the left) present.  Lymphadenopathy:     Cervical: No cervical adenopathy.  Skin:    General: Skin is warm and dry.     Findings: No rash.  Neurological:     General: No focal deficit present.     Mental Status: She is alert and oriented to person, place, and time. Mental status is at baseline.     Motor: Tremor present.  Psychiatric:        Speech: Speech is delayed.        Behavior: Behavior is actively hallucinating. Behavior is cooperative.        Cognition and Memory: Cognition is impaired. Memory is impaired.     No results found.  No results found for this or any previous visit (from the past 2160 hours).      Beverley KATHEE Hummer, MD  I,Emily Lagle,acting as a scribe for Beverley KATHEE Hummer, MD.,have documented all relevant documentation on the behalf of Beverley KATHEE Hummer, MD.  LILLETTE Beverley KATHEE Hummer, MD, have reviewed all documentation for this visit. The documentation on 10/10/2024 for the exam, diagnosis, procedures, and orders are all accurate and complete.

## 2024-10-10 NOTE — Patient Instructions (Addendum)
 It was very nice to see you today!  VISIT SUMMARY: Today, we addressed your frequent falls, head trauma, and chronic pain. You also received your flu shot.  YOUR PLAN: PARKINSON'S DISEASE WITH DEMENTIA AND FREQUENT FALLS: You have advanced Parkinson's disease, which is causing frequent falls and unsteady gait. Your recent fall on October 21st resulted in a head injury, but the CT scan showed no fractures. -Ensure safety measures are in place to prevent falls. -Monitor for any recurrence of symptoms.  ANEMIA: Your anemia is well-managed with current blood counts around 11. -Continue current management.  ENCOUNTER FOR IMMUNIZATION: You were due for your flu vaccination. -Administered flu shot.  Return if symptoms worsen or fail to improve.   Take care, Arvella Hummer, MD, MS   PLEASE NOTE:  If you had any lab tests, please let us  know if you have not heard back within a few days. You may see your results on mychart before we have a chance to review them but we will give you a call once they are reviewed by us .   If we ordered any referrals today, please let us  know if you have not heard from their office within the next week.   If you had any urgent prescriptions sent in today, please check with the pharmacy within an hour of our visit to make sure the prescription was transmitted appropriately.   Please try these tips to maintain a healthy lifestyle:  Eat at least 3 REAL meals and 1-2 snacks per day.  Aim for no more than 5 hours between eating.  If you eat breakfast, please do so within one hour of getting up.   Each meal should contain half fruits/vegetables, one quarter protein, and one quarter carbs (no bigger than a computer mouse)  Cut down on sweet beverages. This includes juice, soda, and sweet tea.   Drink at least 1 glass of water  with each meal and aim for at least 8 glasses per day  Exercise at least 150 minutes every week.

## 2024-10-17 ENCOUNTER — Ambulatory Visit

## 2024-11-07 ENCOUNTER — Other Ambulatory Visit: Payer: Self-pay | Admitting: Neurology

## 2024-11-07 DIAGNOSIS — R441 Visual hallucinations: Secondary | ICD-10-CM

## 2024-11-07 DIAGNOSIS — G20A1 Parkinson's disease without dyskinesia, without mention of fluctuations: Secondary | ICD-10-CM

## 2024-12-02 ENCOUNTER — Ambulatory Visit: Admitting: Neurology

## 2024-12-03 ENCOUNTER — Telehealth: Payer: Self-pay | Admitting: Family Medicine

## 2024-12-03 NOTE — Telephone Encounter (Signed)
 Patient dropped off document Home Health Certificate (Order ID [id number]), to be filled out by provider. Patient requested to send it back via Fax within 7-days. Document is located in providers tray at front office.Please advise at [phone]  Home health order came through fax. I put in the dr box

## 2024-12-03 NOTE — Telephone Encounter (Signed)
 Patient dropped off document Home Health Certificate (Order ID [id number]), to be filled out by provider. Patient requested to send it back via Fax within 7-days. Document is located in providers tray at front office.Please advise at [phone]   Home health order came through fax. I put in the dr box

## 2024-12-03 NOTE — Telephone Encounter (Signed)
 CLINICAL USE BELOW THIS LINE (use X to signify taken)  __x__Form received and placed in providers office for signature. ____Form completed and faxed to LOA Dept. ____Form completed & LVM to notify pt ready for pick up ____Charge sheet & copy of form in front office folder for office supervisor.

## 2024-12-04 NOTE — Telephone Encounter (Signed)
"   CLINICAL USE BELOW THIS LINE (use X to signify taken)  ____Form received and placed in providers office for signature. __x__Form completed and faxed to LOA Dept. ____Form completed & LVM to notify pt ready for pick up __x__Charge sheet & copy of form in front office folder for office supervisor.   "

## 2024-12-05 ENCOUNTER — Ambulatory Visit (INDEPENDENT_AMBULATORY_CARE_PROVIDER_SITE_OTHER)

## 2024-12-05 VITALS — Ht 60.0 in | Wt 135.0 lb

## 2024-12-05 DIAGNOSIS — Z0000 Encounter for general adult medical examination without abnormal findings: Secondary | ICD-10-CM | POA: Diagnosis not present

## 2024-12-05 NOTE — Progress Notes (Signed)
 "  Chief Complaint  Patient presents with   Medicare Wellness     Subjective:   Alexandra Lindsey is a 84 y.o. female who presents for a Medicare Annual Wellness Visit.  Visit info / Clinical Intake: Medicare Wellness Visit Type:: Subsequent Annual Wellness Visit Persons participating in visit and providing information:: patient & caregiver Medicare Wellness Visit Mode:: Telephone If telephone:: video declined Since this visit was completed virtually, some vitals may be partially provided or unavailable. Missing vitals are due to the limitations of the virtual format.: Documented vitals are patient reported If Telephone or Video please confirm:: I connected with patient using audio/video enable telemedicine. I verified patient identity with two identifiers, discussed telehealth limitations, and patient agreed to proceed. Patient Location:: home Provider Location:: office Interpreter Needed?: No Pre-visit prep was completed: yes AWV questionnaire completed by patient prior to visit?: no Living arrangements:: lives with spouse/significant other Patient's Overall Health Status Rating: (!) poor Typical amount of pain: some Does pain affect daily life?: (!) yes Are you currently prescribed opioids?: no  Dietary Habits and Nutritional Risks How many meals a day?: 3 Eats fruit and vegetables daily?: (!) no Most meals are obtained by: preparing own meals In the last 2 weeks, have you had any of the following?: none Diabetic:: no  Functional Status Activities of Daily Living (to include ambulation/medication): (!) Needs Assist Feeding: Independent Dressing/Grooming: Needs assistance (husband helps) Bathing: Needs assistance (husband helps) Toileting: Needs assistance (husband helps) Transfer: Needs assistance (husband helps) Ambulation: Needs Assistance Home Assistive Devices/Equipment: Wheelchair; Environmental Consultant (specify Type); Cane Medication Administration: Needs assistance (comment)  (family sets up for the week) Home Management (perform basic housework or laundry): Dependent Manage your own finances?: (!) no (husband and daughter manage) Primary transportation is: family / friends Concerns about vision?: (!) yes (has dry eyes) Concerns about hearing?: no  Fall Screening Falls in the past year?: 1 (loses balance) Number of falls in past year: 1 Was there an injury with Fall?: 0 Fall Risk Category Calculator: 2 Patient Fall Risk Level: Moderate Fall Risk  Fall Risk Patient at Risk for Falls Due to: Impaired balance/gait; History of fall(s); Impaired mobility; Medication side effect Fall risk Follow up: Falls prevention discussed; Falls evaluation completed  Home and Transportation Safety: All rugs have non-skid backing?: N/A, no rugs All stairs or steps have railings?: N/A, no stairs (has a ramp) Grab bars in the bathtub or shower?: yes Have non-skid surface in bathtub or shower?: yes Good home lighting?: yes Regular seat belt use?: yes Hospital stays in the last year:: (!) no; yes How many hospital stays:: 1 Reason: fall  Cognitive Assessment Difficulty concentrating, remembering, or making decisions? : yes Will 6CIT or Mini Cog be Completed: no 6CIT or Mini Cog Declined: patient has a diagnosis of dementia or cognitive impairment  Advance Directives (For Healthcare) Does Patient Have a Medical Advance Directive?: Yes Type of Advance Directive: Healthcare Power of [place]; Living will Copy of Healthcare Power of Attorney in Chart?: No - copy requested Copy of Living Will in Chart?: No - copy requested  Reviewed/Updated  Reviewed/Updated: Reviewed All (Medical, Surgical, Family, Medications, Allergies, Care Teams, Patient Goals)    Allergies (verified) Lyrica  [pregabalin ], Macrobid [nitrofurantoin macrocrystal], Baclofen , Cephalosporins, Codeine, Darvocet [propoxyphene n-acetaminophen ], Diclofenac , Levaquin [levofloxacin], Penicillins, Selegiline  hcl, Tramadol, and Triamcinolone    Current Medications (verified) Outpatient Encounter Medications as of 12/05/2024  Medication Sig   acetaminophen  (TYLENOL ) 500 MG tablet Take 1,000 mg by mouth 3 (three) times daily.  carbidopa -levodopa  (SINEMET  IR) 25-100 MG tablet TAKE 2 AT 5AM/1 AT 7AM/2 AT 9AM/1 AT 11AM/ 2 AT 1PM/ 1 AT 3PM/1 AT 5PM AND PATIENT HAS ADDED 1 AT BEDTIME.   midodrine  (PROAMATINE ) 10 MG tablet TAKE 1 TABLET BY MOUTH 3 TIMES DAILY WITH MEALS.   QUEtiapine  (SEROQUEL ) 25 MG tablet TAKE 1/2 TABLET TWICE A DAY BY MOUTH   sertraline  (ZOLOFT ) 100 MG tablet TAKE 1 AND 1/2 TABLETS BY MOUTH AT NIGHT   celecoxib  (CELEBREX ) 100 MG capsule TAKE 1 CAPSULE BY MOUTH TWICE A DAY (Patient not taking: Reported on 12/05/2024)   vitamin B-12 (CYANOCOBALAMIN ) 500 MCG tablet Take 500 mcg by mouth daily. (Patient not taking: Reported on 12/05/2024)   No facility-administered encounter medications on file as of 12/05/2024.    History: Past Medical History:  Diagnosis Date   Anemia    as young girl and teenager   Arthritis    Chest pain, unspecified    Constipation    Costochondritis    GERD (gastroesophageal reflux disease)    Headache    ocular migraines   History of hiatal hernia    Hyperlipidemia 10-07-12   Never smoked tobacco    Parkinson's disease (HCC) 10-07-12   Parkinson's disease (HCC)    Prediabetes    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   Spinal stenosis    Past Surgical History:  Procedure Laterality Date   ABDOMINAL HYSTERECTOMY     APPENDECTOMY     BACK SURGERY  12/20/2015   BACK SURGERY  09/27/2017   BACK SURGERY  2020   BUNIONECTOMY Bilateral 11-11-3   CARDIAC CATHETERIZATION Bilateral 2013   COLONOSCOPY     HERNIA REPAIR     LEFT HEART CATHETERIZATION WITH CORONARY ANGIOGRAM N/A 07/03/2012   Procedure: LEFT HEART CATHETERIZATION WITH CORONARY ANGIOGRAM;  Surgeon: Debby DELENA Sor, MD;  Location[facility] CATH LAB;  Service: Cardiovascular;  Laterality: N/A;   REPAIR OF  CEREBROSPINAL FLUID LEAK N/A 12/24/2015   Procedure: LUMBAR WOUND EXPLORATION, REPAIR OF CEREBROSPINAL FLUID LEAK, PLACEMENT OF LUMBAR DRAIN;  Surgeon: Morene Hicks Ditty, MD;  Location: [facility] NEURO ORS;  Service: Neurosurgery;  Laterality: N/A;   ROTATOR CUFF REPAIR Left 10-07-12   Family History  Problem Relation Age of Onset   Heart attack Mother    Esophageal cancer Mother    Suicidality Father        suicide    Suicidality Brother        suicide    CVA Brother    Hypertension Brother    Hypercholesterolemia Brother    Mental retardation Brother    Social History   Occupational History   Occupation: retired    Comment: advertising account executive  Tobacco Use   Smoking status: Never   Smokeless tobacco: Never  Vaping Use   Vaping status: Never Used  Substance and Sexual Activity   Alcohol use: No    Alcohol/week: 0.0 standard drinks of alcohol   Drug use: No   Sexual activity: Yes   Tobacco Counseling Counseling given: Not Answered  SDOH Screenings   Food Insecurity: No Food Insecurity (12/05/2024)  Housing: Unknown (12/05/2024)  Transportation Needs: No Transportation Needs (12/05/2024)  Utilities: Not At Risk (12/05/2024)  Alcohol Screen: Low Risk (12/05/2024)  Depression (PHQ2-9): Low Risk (12/05/2024)  Financial Resource Strain: Low Risk (12/05/2024)  Physical Activity: Insufficiently Active (12/05/2024)  Social Connections: Moderately Integrated (12/05/2024)  Stress: No Stress Concern Present (12/05/2024)  Tobacco Use: Low Risk (12/05/2024)  Health Literacy: Adequate Health Literacy (12/05/2024)  See flowsheets for full screening details  Depression Screen PHQ 2 & 9 Depression Scale- Over the past 2 weeks, how often have you been bothered by any of the following problems? Little interest or pleasure in doing things: 0 Feeling down, depressed, or hopeless (PHQ Adolescent also includes...irritable): 0 PHQ-2 Total Score: 0 Trouble falling or staying asleep, or sleeping too much: 0 Feeling  tired or having little energy: 1 Poor appetite or overeating (PHQ Adolescent also includes...weight loss): 0 Feeling bad about yourself - or that you are a failure or have let yourself or your family down: 0 Trouble concentrating on things, such as reading the newspaper or watching television (PHQ Adolescent also includes...like school work): 0 Moving or speaking so slowly that other people could have noticed. Or the opposite - being so fidgety or restless that you have been moving around a lot more than usual: 0 Thoughts that you would be better off dead, or of hurting yourself in some way: 0 PHQ-9 Total Score: 1 If you checked off any problems, how difficult have these problems made it for you to do your work, take care of things at home, or get along with other people?: Not difficult at all  Depression Treatment Depression Interventions/Treatment : EYV7-0 Score <4 Follow-up Not Indicated     Goals Addressed             This Visit's Progress    Patient Stated       12/05/2024, denies goals             Objective:    Today's Vitals   12/05/24 1127  Weight: 135 lb (61.2 kg)  Height: 5' (1.524 m)   Body mass index is 26.37 kg/m.  Hearing/Vision screen Hearing Screening - Comments:: Denies hearing issues Vision Screening - Comments:: Regular eye exams, Dr. Cleotilde Immunizations and Health Maintenance Health Maintenance  Topic Date Due   COVID-19 Vaccine (4 - 2025-26 season) 07/28/2024   Zoster Vaccines- Shingrix  (2 of 2) 09/25/2024   DTaP/Tdap/Td (2 - Tdap) 02/20/2025 (Originally 09/09/2023)   Medicare Annual Wellness (AWV)  12/05/2025   Pneumococcal Vaccine: 50+ Years  Completed   Influenza Vaccine  Completed   Bone Density Scan  Completed   Meningococcal B Vaccine  Aged Out        Assessment/Plan:  This is a routine wellness examination for Charley.  Patient Care Team: Sebastian Beverley NOVAK, MD as PCP - General (Family Medicine) Claudene Arthea HERO, DO as Attending  Physician (Family Medicine) [name], Asberry RAMAN, DO as Consulting Physician (Neurology) Powers, Marsa POUR, MD as Referring Physician (Neurosurgery) Powers, Alm Pride, MD as Referring Physician (Oral Surgery) Nathanael Davies, [facility] (Inactive) as Pharmacist (Pharmacist) Cleotilde Sewer, OD (Optometry)  I have personally reviewed and noted the following in the patients chart:   Medical and social history Use of alcohol, tobacco or illicit drugs  Current medications and supplements including opioid prescriptions. Functional ability and status Nutritional status Physical activity Advanced directives List of other physicians Hospitalizations, surgeries, and ER visits in previous 12 months Vitals Screenings to include cognitive, depression, and falls Referrals and appointments  No orders of the defined types were placed in this encounter.  In addition, I have reviewed and discussed with patient certain preventive protocols, quality metrics, and best practice recommendations. A written personalized care plan for preventive services as well as general preventive health recommendations were provided to patient.   Ardella FORBES Dawn, LPN   [id number]/7973   Return in 1 year (on  12/05/2025).  After Visit Summary: (In Person-Declined) Patient declined AVS at this time.  Nurse Notes: Vaccines not given: covid declined today HM Addressed: Vaccines Due: second shingles  "

## 2024-12-05 NOTE — Patient Instructions (Signed)
 Ms. Ravan,  Thank you for taking the time for your Medicare Wellness Visit. I appreciate your continued commitment to your health goals. Please review the care plan we discussed, and feel free to reach out if I can assist you further.  Please note that Annual Wellness Visits do not include a physical exam. Some assessments may be limited, especially if the visit was conducted virtually. If needed, we may recommend an in-person follow-up with your provider.  Ongoing Care Seeing your primary care provider every 3 to 6 months helps us  monitor your health and provide consistent, personalized care.   Referrals If a referral was made during today's visit and you haven't received any updates within two weeks, please contact the referred provider directly to check on the status.  Recommended Screenings:  Health Maintenance  Topic Date Due   COVID-19 Vaccine (4 - 2025-26 season) 07/28/2024   Medicare Annual Wellness Visit  08/14/2024   Zoster (Shingles) Vaccine (2 of 2) 09/25/2024   DTaP/Tdap/Td vaccine (2 - Tdap) 02/20/2025*   Pneumococcal Vaccine for age over 65  Completed   Flu Shot  Completed   Osteoporosis screening with Bone Density Scan  Completed   Meningitis B Vaccine  Aged Out  *Topic was postponed. The date shown is not the original due date.       12/05/2024   11:32 AM  Advanced Directives  Does Patient Have a Medical Advance Directive? Yes  Type of Estate Agent of [place];Living will  Copy of Healthcare Power of Attorney in Chart? No - copy requested    Vision: Annual vision screenings are recommended for early detection of glaucoma, cataracts, and diabetic retinopathy. These exams can also reveal signs of chronic conditions such as diabetes and high blood pressure.  Dental: Annual dental screenings help detect early signs of oral cancer, gum disease, and other conditions linked to overall health, including heart disease and diabetes.  Please see the  attached documents for additional preventive care recommendations.

## 2024-12-25 NOTE — Progress Notes (Signed)
 "   Assessment/Plan:   1.  Parkinsons Disease  -stop carbidopa /levodopa  25/100 on the following schedule:  2 at 5am/1 at 7am/2 at 9am/1 at 11am/ 2 at 1pm/ 1 at 3pm/1 at 5pm and 1 at bedtime.     -start crexont , 280 mg, 2 po tid at 6am/noon/6pm.  Samples provided.  Call me in a week and let me know how doing.  -another LONG, LONG discussion on NOT WALKING unless someone is with her directly.  She well understands the consequences of falls.  Husband notes that she isn't going to listen to the advice   2.  PDD with Parkinson's hallucinations  -She has reported that Nuplazid  caused sleepiness.  Previously she had reported that it caused swelling.  it did not do this the second time we tried it, but she reported EDS.  She is tolerating seroquel  12.5 mg bid and doesn't want to increase because of EDS but wonder if the EDS is from the IR levodopa .  Told her we will change the IR levodopa  to the crexont  first and then address the seroquel  and may be able to increase  3.  Neurogenic Orthostatic Hypotension  -not taking midodrine  and BP quite low.  I see pcp tried to RX as well but patient admits she is not taking it.  Unclear is some of falls from orthostasis.  told her to take 5 mg tid with meals faithfully.  4.  Constipation  -taking miralax  regularly.  Discussed increasing water  intake.  Husband trying to encourage this.    5.  Sialorrhea  -This is commonly associated with PD.  We talked about treatments.  The patient is not a candidate for oral anticholinergic therapy because of increased risk of confusion and falls.  We discussed Botox (type A and B) and 1% atropine drops.  We discusssed that candy like lemon drops can help by stimulating mm of the oropharynx to induce swallowing.  She wants to hold for now.    Subjective:   Alexandra Lindsey was seen today in follow up for Parkinsons disease.  My previous records were reviewed prior to todays visit as well as outside records available to me.    This patient is accompanied in the office by her  husband  who supplements the history.  We have had many long discussions with the patient about the fact that she is not to be walking because she has such a high fall risk.  She has continued to walk/have falls.  She was admitted to [facility] with 1 of these at the end of October.  Her husband had been sick and EMS had been called because of her husband and they took him to the hospital.  After EMS left, the patient tried to move a chair on her own and she slipped and fell and hit her head.  At the hospital, they recommended SNF but the patient declined.  Husband notes falling consistently.  Just gets up.  Hallucinations are worse.  Confusion is worse.     current prescribed movement disorder medications: Carbidopa /levodopa  25/100,  2 at 5am/1 at 7am/2 at 9am/1 at 11am/ 2 at 1pm/ 1 at 3pm/1 at 5pm (and 1 at bed) Quetiapine  12.5 mg twice daily   PREVIOUS MEDICATIONS: Pramipexole  (stopped because of hallucinations); stalevo ; klonopin ; rytary ; nuplazid  (states took it for a week and it made her swell; she retried it a second time and reported that it caused sleepiness); carbidopa /levodopa  50/200 at bedtime (patient just did not take it long  after giving it and stopped for unknown reason); midodrine  (no side effects but patient stopped it)   ALLERGIES:   Allergies  Allergen Reactions   Lyrica  [Pregabalin ] Rash and Other (See Comments)    Weight gain and swelling in hands, legs and feet   Macrobid [Nitrofurantoin Macrocrystal] Swelling and Rash   Baclofen  Other (See Comments)    Kept her awake, affected memory   Cephalosporins Other (See Comments)    Cannot sleep   Codeine Nausea Only    dizziness   Darvocet [Propoxyphene N-Acetaminophen ] Nausea And Vomiting   Diclofenac  Rash    The gel caused a rash   Levaquin [Levofloxacin] Hives   Penicillins Rash   Selegiline Hcl Rash   Tramadol Nausea And Vomiting   Triamcinolone  Itching    CURRENT  MEDICATIONS:  Outpatient Encounter Medications as of 12/26/2024  Medication Sig   acetaminophen  (TYLENOL ) 500 MG tablet Take 1,000 mg by mouth 3 (three) times daily.   midodrine  (PROAMATINE ) 10 MG tablet TAKE 1 TABLET BY MOUTH 3 TIMES DAILY WITH MEALS.   QUEtiapine  (SEROQUEL ) 25 MG tablet TAKE 1/2 TABLET TWICE A DAY BY MOUTH   sertraline  (ZOLOFT ) 100 MG tablet TAKE 1 AND 1/2 TABLETS BY MOUTH AT NIGHT   vitamin B-12 (CYANOCOBALAMIN ) 500 MCG tablet Take 500 mcg by mouth daily.   [DISCONTINUED] carbidopa -levodopa  (SINEMET  IR) 25-100 MG tablet TAKE 2 AT 5AM/1 AT 7AM/2 AT 9AM/1 AT 11AM/ 2 AT 1PM/ 1 AT 3PM/1 AT 5PM AND PATIENT HAS ADDED 1 AT BEDTIME.   [DISCONTINUED] celecoxib  (CELEBREX ) 100 MG capsule TAKE 1 CAPSULE BY MOUTH TWICE A DAY (Patient not taking: Reported on 12/05/2024)   No facility-administered encounter medications on file as of 12/26/2024.    Objective:   PHYSICAL EXAMINATION:    VITALS:   Vitals:   12/26/24 1100  BP: 116/78  Pulse: 71  SpO2: 98%  Weight: 138 lb (62.6 kg)     GEN:  The patient appears stated age and is in NAD. HEENT:  Normocephalic, atraumatic.  The mucous membranes are moist. The superficial temporal arteries are without ropiness or tenderness. CV:  rrr Lungs:  ctab Neck:  no bruits  Neurological examination:  Orientation: The patient is alert and oriented x3. Cranial nerves: There is good facial symmetry with facial hypomimia. The speech is fluent and clear.  She is hypophonic.  Soft palate rises symmetrically and there is no tongue deviation. Hearing is intact to conversational tone. Sensation: Sensation is intact to light touch throughout Motor: Strength is at least antigravity x4.  Movement examination: Tone: There is normal tone in the UE/LE  Abnormal movements: no dyskinesia or tremor Coordination:  There is no decremation in the upper or lower extremities, but testing in the left hand is limited because of tendon/orthopedic issues.  This is  stable Gait and Station: Not tested today as I don't want her to ambulate.  She is leaned to the L in the transport chair  I have reviewed and interpreted the following labs independently    Chemistry      Component Value Date/Time   NA 141 05/27/2024 1405   K 4.4 05/27/2024 1405   CL 108 05/27/2024 1405   CO2 24 05/27/2024 1405   BUN 27 (H) 05/27/2024 1405   CREATININE 1.03 (H) 05/27/2024 1405      Component Value Date/Time   CALCIUM  9.7 05/27/2024 1405   ALKPHOS 94 11/17/2016 1154   AST 12 05/27/2024 1405   ALT 4 (L) 05/27/2024 1405  BILITOT 0.4 05/27/2024 1405       Lab Results  Component Value Date   WBC 4.7 05/27/2024   HGB 11.9 (L) 05/27/2024   HCT 35.9 (L) 05/27/2024   MCV 92.8 05/27/2024   PLT 229.0 05/27/2024    Lab Results  Component Value Date   TSH 1.19 05/27/2024     Total time spent on today's visit was 31 minutes, including both face-to-face time and nonface-to-face time.  Time included that spent on review of records (prior notes available to me/labs/imaging if pertinent), discussing treatment and goals, answering patient's questions and coordinating care.  Cc:  Sebastian Beverley NOVAK, MD "

## 2024-12-26 ENCOUNTER — Ambulatory Visit (INDEPENDENT_AMBULATORY_CARE_PROVIDER_SITE_OTHER): Admitting: Neurology

## 2024-12-26 VITALS — BP 116/78 | HR 71 | Wt 138.0 lb

## 2024-12-26 DIAGNOSIS — G20A1 Parkinson's disease without dyskinesia, without mention of fluctuations: Secondary | ICD-10-CM

## 2024-12-26 DIAGNOSIS — G20B1 Parkinson's disease with dyskinesia, without mention of fluctuations: Secondary | ICD-10-CM

## 2024-12-26 DIAGNOSIS — F068 Other specified mental disorders due to known physiological condition: Secondary | ICD-10-CM

## 2024-12-26 NOTE — Patient Instructions (Addendum)
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 She has experienced increased confusion, behavioral changes, and multiple recent falls, including head injuries.   YOUR PLAN: -PARKINSON'S DISEASE DEMENTIA WITH HALLUCINATIONS: Parkinson's disease dementia is a decline in thinking and reasoning that develops in someone with Parkinson's disease. Your hallucinations and confusion have worsened. We discussed the possibility of increasing your quetiapine  dosage if symptoms continue to worsen, and we will reassess after changing your carbidopa -levodopa  medication.  -PARKINSON'S DISEASE: Parkinson's disease is a progressive nervous system disorder that affects movement. We are transitioning you to an extended-release form of carbidopa -levodopa  to reduce the number of pills you need to take and to improve your symptoms. You should take 2 capsules of the extended-release medication called Crexont  (280 mg) three times daily, 30 minutes before meals. We provided samples and will need to check your response and tolerability after one week. Your caregiver/husband will need to report back, and we may need to get insurance approval. Continue taking your other Parkinson's medications as prescribed.  -RECURRENT FALLS: You have severe balance issues that have led to multiple falls and head injuries, putting you at high risk for serious injury. It is very important to follow strict fall precautions, including staying seated and avoiding walking without assistance. Do not walk unsupervised or use assistive devices without supervision. Please adhere to these safety recommendations to prevent further injuries.  INSTRUCTIONS: Your caregiver/husband should report your response and tolerability to the new carbidopa -levodopa  extended-release medication after  one week. We may need to get insurance approval for this medication.    Contains text generated by [employer].   "

## 2025-01-02 ENCOUNTER — Telehealth: Payer: Self-pay | Admitting: Neurology

## 2025-01-02 NOTE — Telephone Encounter (Signed)
 Pt 's husband Tressia called in this afternoon. Tressia stated that the prescription called : Crexont  is not working for Pt. Tressia stated that , that prescription is making Pt sleepy and she has had 2 unexpected bowel movement in her dress clothes. Tressia did not give Pt the prescription today. Tressia went ahead and start giving Pt the old prescription  called  Carbidopa -Levodopa  25-100 MG . Thanks

## 2025-07-02 ENCOUNTER — Ambulatory Visit: Payer: Self-pay | Admitting: Neurology

## 2025-12-08 ENCOUNTER — Ambulatory Visit
